# Patient Record
Sex: Female | Born: 1945
Health system: Southern US, Community
[De-identification: ages and names within clinical notes are randomized; demographics above are authoritative.]

## PROBLEM LIST (undated history)

## (undated) DIAGNOSIS — B019 Varicella without complication: Secondary | ICD-10-CM

## (undated) DIAGNOSIS — R0600 Dyspnea, unspecified: Secondary | ICD-10-CM

## (undated) DIAGNOSIS — I10 Essential (primary) hypertension: Secondary | ICD-10-CM

## (undated) DIAGNOSIS — Z87442 Personal history of urinary calculi: Secondary | ICD-10-CM

## (undated) DIAGNOSIS — Z8709 Personal history of other diseases of the respiratory system: Secondary | ICD-10-CM

## (undated) DIAGNOSIS — T4145XA Adverse effect of unspecified anesthetic, initial encounter: Secondary | ICD-10-CM

## (undated) DIAGNOSIS — T8859XA Other complications of anesthesia, initial encounter: Secondary | ICD-10-CM

## (undated) DIAGNOSIS — R011 Cardiac murmur, unspecified: Secondary | ICD-10-CM

## (undated) DIAGNOSIS — T7840XA Allergy, unspecified, initial encounter: Secondary | ICD-10-CM

## (undated) DIAGNOSIS — B059 Measles without complication: Secondary | ICD-10-CM

## (undated) HISTORY — PX: TUBAL LIGATION: SHX77

## (undated) HISTORY — DX: Measles without complication: B05.9

## (undated) HISTORY — PX: COLONOSCOPY: SHX174

## (undated) HISTORY — DX: Essential (primary) hypertension: I10

## (undated) HISTORY — DX: Varicella without complication: B01.9

## (undated) HISTORY — DX: Allergy, unspecified, initial encounter: T78.40XA

---
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
## 2000-04-17 ENCOUNTER — Other Ambulatory Visit: Admission: RE | Admit: 2000-04-17 | Discharge: 2000-04-17 | Payer: Self-pay | Admitting: Family Medicine

## 2000-05-03 ENCOUNTER — Encounter: Admission: RE | Admit: 2000-05-03 | Discharge: 2000-05-03 | Payer: Self-pay | Admitting: Family Medicine

## 2000-05-03 ENCOUNTER — Encounter: Payer: Self-pay | Admitting: Family Medicine

## 2000-09-27 ENCOUNTER — Encounter: Admission: RE | Admit: 2000-09-27 | Discharge: 2000-09-27 | Payer: Self-pay

## 2000-09-27 ENCOUNTER — Encounter: Payer: Self-pay | Admitting: Family Medicine

## 2002-01-07 ENCOUNTER — Encounter: Admission: RE | Admit: 2002-01-07 | Discharge: 2002-01-07 | Payer: Self-pay | Admitting: Family Medicine

## 2002-01-07 ENCOUNTER — Encounter: Payer: Self-pay | Admitting: Family Medicine

## 2003-05-22 ENCOUNTER — Encounter: Admission: RE | Admit: 2003-05-22 | Discharge: 2003-05-22 | Payer: Self-pay | Admitting: Family Medicine

## 2011-09-29 DIAGNOSIS — E785 Hyperlipidemia, unspecified: Secondary | ICD-10-CM | POA: Diagnosis not present

## 2011-09-29 DIAGNOSIS — Z124 Encounter for screening for malignant neoplasm of cervix: Secondary | ICD-10-CM | POA: Diagnosis not present

## 2011-09-29 DIAGNOSIS — E039 Hypothyroidism, unspecified: Secondary | ICD-10-CM | POA: Diagnosis not present

## 2011-09-29 DIAGNOSIS — I10 Essential (primary) hypertension: Secondary | ICD-10-CM | POA: Diagnosis not present

## 2011-09-29 DIAGNOSIS — Z0000 Encounter for general adult medical examination without abnormal findings: Secondary | ICD-10-CM | POA: Diagnosis not present

## 2011-09-29 DIAGNOSIS — R5381 Other malaise: Secondary | ICD-10-CM | POA: Diagnosis not present

## 2011-09-29 DIAGNOSIS — E559 Vitamin D deficiency, unspecified: Secondary | ICD-10-CM | POA: Diagnosis not present

## 2011-10-09 DIAGNOSIS — Z1212 Encounter for screening for malignant neoplasm of rectum: Secondary | ICD-10-CM | POA: Diagnosis not present

## 2011-11-16 DIAGNOSIS — R195 Other fecal abnormalities: Secondary | ICD-10-CM | POA: Diagnosis not present

## 2011-11-21 DIAGNOSIS — Z79899 Other long term (current) drug therapy: Secondary | ICD-10-CM | POA: Diagnosis not present

## 2011-11-21 DIAGNOSIS — I10 Essential (primary) hypertension: Secondary | ICD-10-CM | POA: Diagnosis not present

## 2011-11-21 DIAGNOSIS — Z1211 Encounter for screening for malignant neoplasm of colon: Secondary | ICD-10-CM | POA: Diagnosis not present

## 2011-11-21 DIAGNOSIS — Z6841 Body Mass Index (BMI) 40.0 and over, adult: Secondary | ICD-10-CM | POA: Diagnosis not present

## 2011-11-21 DIAGNOSIS — E669 Obesity, unspecified: Secondary | ICD-10-CM | POA: Diagnosis not present

## 2011-11-21 DIAGNOSIS — Z7982 Long term (current) use of aspirin: Secondary | ICD-10-CM | POA: Diagnosis not present

## 2011-11-21 DIAGNOSIS — Z880 Allergy status to penicillin: Secondary | ICD-10-CM | POA: Diagnosis not present

## 2011-11-21 DIAGNOSIS — R195 Other fecal abnormalities: Secondary | ICD-10-CM | POA: Diagnosis not present

## 2012-01-03 DIAGNOSIS — Z1231 Encounter for screening mammogram for malignant neoplasm of breast: Secondary | ICD-10-CM | POA: Diagnosis not present

## 2012-01-03 DIAGNOSIS — M810 Age-related osteoporosis without current pathological fracture: Secondary | ICD-10-CM | POA: Diagnosis not present

## 2012-01-03 DIAGNOSIS — M899 Disorder of bone, unspecified: Secondary | ICD-10-CM | POA: Diagnosis not present

## 2012-01-03 LAB — HM DEXA SCAN: HM Dexa Scan: NORMAL

## 2012-07-17 ENCOUNTER — Encounter: Payer: Self-pay | Admitting: Nurse Practitioner

## 2012-07-17 ENCOUNTER — Ambulatory Visit (INDEPENDENT_AMBULATORY_CARE_PROVIDER_SITE_OTHER): Payer: Medicare Other | Admitting: Nurse Practitioner

## 2012-07-17 VITALS — BP 140/77 | HR 76 | Temp 98.1°F | Ht 61.0 in | Wt 220.0 lb

## 2012-07-17 DIAGNOSIS — I10 Essential (primary) hypertension: Secondary | ICD-10-CM | POA: Diagnosis not present

## 2012-07-17 LAB — COMPLETE METABOLIC PANEL WITH GFR
ALT: 15 U/L (ref 0–35)
AST: 18 U/L (ref 0–37)
Albumin: 3.8 g/dL (ref 3.5–5.2)
Alkaline Phosphatase: 58 U/L (ref 39–117)
BUN: 18 mg/dL (ref 6–23)
CO2: 28 mEq/L (ref 19–32)
Calcium: 9.5 mg/dL (ref 8.4–10.5)
Chloride: 104 mEq/L (ref 96–112)
Creat: 0.8 mg/dL (ref 0.50–1.10)
GFR, Est African American: 89 mL/min
GFR, Est Non African American: 78 mL/min
Glucose, Bld: 103 mg/dL — ABNORMAL HIGH (ref 70–99)
Potassium: 4.3 mEq/L (ref 3.5–5.3)
Sodium: 143 mEq/L (ref 135–145)
Total Bilirubin: 0.4 mg/dL (ref 0.3–1.2)
Total Protein: 6.7 g/dL (ref 6.0–8.3)

## 2012-07-17 MED ORDER — BENAZEPRIL-HYDROCHLOROTHIAZIDE 20-12.5 MG PO TABS: 1.0000 | ORAL_TABLET | Freq: Every day | ORAL | Status: DC

## 2012-07-17 NOTE — Progress Notes (Signed)
  Subjective:    Patient ID: Angela Dawson, female    DOB: 1945-03-11, 67 y.o.   MRN: [id number]  Hypertension This is a chronic problem. The current episode started more than 1 year ago. The problem is unchanged. The problem is controlled. Pertinent negatives include no blurred vision, chest pain, headaches, malaise/fatigue, peripheral edema or shortness of breath. There are no associated agents to hypertension. Risk factors for coronary artery disease include obesity, sedentary lifestyle and post-menopausal state. Past treatments include ACE inhibitors and diuretics. The current treatment provides significant improvement. There are no compliance problems.       Review of Systems  Constitutional: Negative for malaise/fatigue.  Eyes: Negative for blurred vision.  Respiratory: Negative for shortness of breath.   Cardiovascular: Negative for chest pain.  Neurological: Negative for headaches.  All other systems reviewed and are negative.       Objective:   Physical Exam  Constitutional: She is oriented to person, place, and time. She appears well-developed and well-nourished.  HENT:  Nose: Nose normal.  Mouth/Throat: Oropharynx is clear and moist.  Eyes: EOM are normal.  Neck: Trachea normal, normal range of motion and full passive range of motion without pain. Neck supple. No JVD present. Carotid bruit is not present. No thyromegaly present.  Cardiovascular: Normal rate, regular rhythm, normal heart sounds and intact distal pulses.  Exam reveals no gallop and no friction rub.   No murmur heard. Pulmonary/Chest: Effort normal and breath sounds normal.  Abdominal: Soft. Bowel sounds are normal. She exhibits no distension and no mass. There is no tenderness.  Musculoskeletal: Normal range of motion.  Lymphadenopathy:    She has no cervical adenopathy.  Neurological: She is alert and oriented to person, place, and time. She has normal reflexes.  Skin: Skin is warm and dry.  Psychiatric:  She has a normal mood and affect. Her behavior is normal. Judgment and thought content normal.  BP 140/77  Pulse 76  Temp(Src) 98.1 F (36.7 C) (Oral)  Ht 5\' 1"  (1.549 m)  Wt 220 lb (99.791 kg)  BMI 41.59 kg/m2         Assessment & Plan:  1. Hypertension Low NA+ diet exercise - benazepril-hydrochlorthiazide (LOTENSIN HCT) 20-12.5 MG per tablet; Take 1 tablet by mouth daily.  Dispense: 90 tablet; Refill: 1 - COMPLETE METABOLIC PANEL WITH GFR - NMR Lipoprofile with Lipids  Mary-Margaret Daphine Deutscher, FNP

## 2012-07-17 NOTE — Patient Instructions (Signed)

## 2012-07-19 LAB — NMR LIPOPROFILE WITH LIPIDS
Cholesterol, Total: 202 mg/dL — ABNORMAL HIGH (ref <200)
HDL Particle Number: 30.5 umol/L (ref >=30.5)
HDL Size: 8.9 nm — ABNORMAL LOW (ref >=9.2)
HDL-C: 45 mg/dL (ref >=40)
LDL (calc): 131 mg/dL — ABNORMAL HIGH (ref <100)
LDL Particle Number: 1768 nmol/L — ABNORMAL HIGH (ref <1000)
LDL Size: 21 nm (ref >20.5)
LP-IR Score: 64 — ABNORMAL HIGH (ref <=45)
Large HDL-P: 6.2 umol/L (ref >=4.8)
Large VLDL-P: 5.2 nmol/L — ABNORMAL HIGH (ref <=2.7)
Small LDL Particle Number: 670 nmol/L — ABNORMAL HIGH (ref <=527)
Triglycerides: 129 mg/dL (ref <150)
VLDL Size: 54.7 nm — ABNORMAL HIGH (ref <=46.6)

## 2013-04-02 ENCOUNTER — Encounter: Payer: Self-pay | Admitting: Nurse Practitioner

## 2013-04-02 ENCOUNTER — Ambulatory Visit (INDEPENDENT_AMBULATORY_CARE_PROVIDER_SITE_OTHER): Payer: Medicare Other | Admitting: Nurse Practitioner

## 2013-04-02 VITALS — BP 178/86 | HR 64 | Temp 97.2°F | Ht 61.0 in | Wt 214.0 lb

## 2013-04-02 DIAGNOSIS — I10 Essential (primary) hypertension: Secondary | ICD-10-CM

## 2013-04-02 MED ORDER — BENAZEPRIL-HYDROCHLOROTHIAZIDE 20-12.5 MG PO TABS
1.0000 | ORAL_TABLET | Freq: Every day | ORAL | Status: DC
Start: 2013-04-02 — End: 2013-09-30

## 2013-04-02 NOTE — Progress Notes (Signed)
Subjective:    Patient ID: Angela Dawson, female    DOB: [birth year], 68 y.o.   MRN: [id number]  Hypertension This is a chronic problem. The current episode started more than 1 year ago. The problem is unchanged. The problem is controlled. Pertinent negatives include no blurred vision, chest pain, headaches, malaise/fatigue, peripheral edema or shortness of breath. There are no associated agents to hypertension. Risk factors for coronary artery disease include obesity, sedentary lifestyle and post-menopausal state. Past treatments include ACE inhibitors and diuretics. The current treatment provides significant improvement. There are no compliance problems.       Review of Systems  Constitutional: Negative for fever, malaise/fatigue and fatigue.  HENT: Negative for congestion and sinus pressure.   Eyes: Negative for blurred vision.  Respiratory: Negative for shortness of breath.   Cardiovascular: Negative for chest pain.  Gastrointestinal: Negative for abdominal pain, diarrhea and constipation.  Genitourinary: Negative for dysuria and frequency.  Musculoskeletal: Negative for back pain and joint swelling.  Skin: Negative for rash.  Neurological: Negative for headaches.  All other systems reviewed and are negative.       Objective:   Physical Exam  Constitutional: She is oriented to person, place, and time. She appears well-developed and well-nourished.  HENT:  Nose: Nose normal.  Mouth/Throat: Oropharynx is clear and moist.  Eyes: EOM are normal.  Neck: Trachea normal, normal range of motion and full passive range of motion without pain. Neck supple. No JVD present. Carotid bruit is not present. No thyromegaly present.  Cardiovascular: Normal rate, regular rhythm and intact distal pulses.  Exam reveals no gallop and no friction rub.   Murmur (1/6) heard. Pulmonary/Chest: Effort normal and breath sounds normal.  Abdominal: Soft. Bowel sounds are normal. She exhibits no distension  and no mass. There is no tenderness.  Musculoskeletal: Normal range of motion.  Lymphadenopathy:    She has no cervical adenopathy.  Neurological: She is alert and oriented to person, place, and time. She has normal reflexes.  Skin: Skin is warm and dry.  Psychiatric: She has a normal mood and affect. Her behavior is normal. Judgment and thought content normal.  BP 178/86  Pulse 64  Temp(Src) 97.2 F (36.2 C) (Oral)  Ht $R'5\' 1"'ss$  (1.549 m)  Wt 214 lb (97.07 kg)  BMI 40.46 kg/m2         Assessment & Plan:   1. Hypertension    Orders Placed This Encounter  Procedures  . CMP14+EGFR  . NMR, lipoprofile  . 2D Echocardiogram without contrast    Standing Status: Future     Number of Occurrences:      Standing Expiration Date: 04/02/2014    Order Specific Question:  Type of Echo    Answer:  Complete    Order Specific Question:  Reason for Exam    Answer:  heart murmur    Order Specific Question:  Where should this test be performed    Answer:  [facility]   Meds ordered this encounter  Medications  . benazepril-hydrochlorthiazide (LOTENSIN HCT) 20-12.5 MG per tablet    Sig: Take 1 tablet by mouth daily.    Dispense:  90 tablet    Refill:  1   Orders Placed This Encounter  Procedures  . CMP14+EGFR  . NMR, lipoprofile  . 2D Echocardiogram without contrast    Standing Status: Future     Number of Occurrences:      Standing Expiration Date: 04/02/2014    Order Specific Question:  Type  of Echo    Answer:  Complete    Order Specific Question:  Reason for Exam    Answer:  heart murmur    Order Specific Question:  Where should this test be performed    Answer:  [facility]    New heart murmur- to be evaluated Labs pending Health maintenance reviewed Diet and exercise encouraged Continue all meds Follow up  In 6 months   [place], FNP

## 2013-04-02 NOTE — Patient Instructions (Signed)
Heart Murmur A heart murmur is an extra sound heard by your health care provider when listening to your heart with a device called a stethoscope. The sound comes from turbulence when blood flows through the heart and may be a "hum" or "whoosh" sound heard when the heart beats. There are two types of heart murmurs:  Innocent murmurs Most people with this type of heart murmur do not have a heart problem. Many children have innocent heart murmurs. Your health care provider may suggest some basic testing to know whether your murmur is an innocent murmur. If an innocent heart murmur is found, there is no need for further tests or treatment and no need to restrict activities or stop playing sports.  Abnormal murmurs These types of murmurs can occur in children and adults. In children, abnormal heart murmurs are typically caused from heart defects that are present at birth (congenital). In adults, abnormal murmurs are usually from heart valve problems caused by disease, infection, or aging. CAUSES  All heart murmurs are a result of an issue with your heart valves. Normally, these valves open to let blood flow through or out of your heart and then shut to keep it from flowing backward. If they do not work properly, you could have:  Regurgitation When blood leaks back through the valve in the wrong direction.  Mitral valve prolapse When the mitral valve of the heart has a loose flap and does not close tightly.  Stenosis When the valve does not open enough and blocks blood flow. SIGNS AND SYMPTOMS  Innocent murmurs do not cause symptoms, and many people with abnormal murmurs may or may not have symptoms. If symptoms do develop, they may include:  Shortness of breath.  Blue coloring of the skin, especially on the fingertips.  Chest pain.  Palpitations, or feeling a fluttering or skipped heartbeat.  Fainting.  Persistent cough.  Getting tired much faster than expected. DIAGNOSIS  A heart murmur  might be heard during a sports physical or during any type of examination. When a murmur is heard, it may suggest a possible problem. When this happens, your health care provider may ask you to see a heart specialist (cardiologist). You may also be asked to have one or more heart tests. In these cases, testing may vary depending on what your health care provider heard. Tests for a heart murmur may include:  Electrocardiogram.  Echocardiogram.  MRI. For children and adults who have an abnormal heart murmur and want to play sports, it is important to complete testing, review test results, and receive recommendations from your health care provider. If heart disease is present, it may not be safe to play. TREATMENT  Innocent murmurs require no treatment or activity restriction. If an abnormal murmur represents a problem with the heart, treatment will depend on the exact nature of the problem. In these cases, medicine or surgery may be needed to treat the problem. HOME CARE INSTRUCTIONS If you want to participate in sports or other types of strenuous physical activity, it is important to discuss this first with your health care provider. If the murmur represents a problem with the heart and you choose to participate in sports, there is a small chance that a serious problem (including sudden death) could result.  SEEK MEDICAL CARE IF:   You feel that your symptoms are slowly worsening.  You develop any new symptoms that cause concern.  You feel that you are having side effects from any medicines prescribed. SEEK IMMEDIATE   MEDICAL CARE IF:   You develop chest pain.  You have shortness of breath.  You notice that your heart beats irregularly often enough to cause you to worry.  You have fainting spells.  Your symptoms suddenly get worse. Document Released: 03/30/2004 Document Revised: 12/11/2012 Document Reviewed: 10/28/2012 [employer] Patient Information 2014 [employer].  

## 2013-04-03 LAB — CMP14+EGFR
A/G RATIO: 1.8 (ref 1.1–2.5)
ALK PHOS: 66 IU/L (ref 39–117)
ALT: 15 IU/L (ref 0–32)
AST: 15 IU/L (ref 0–40)
Albumin: 4.2 g/dL (ref 3.6–4.8)
BUN / CREAT RATIO: 19 (ref 11–26)
BUN: 14 mg/dL (ref 8–27)
CO2: 25 mmol/L (ref 18–29)
Calcium: 9.2 mg/dL (ref 8.7–10.3)
Chloride: 101 mmol/L (ref 97–108)
Creatinine, Ser: 0.75 mg/dL (ref 0.57–1.00)
GFR calc Af Amer: 96 mL/min/{1.73_m2} (ref >59)
GFR, EST NON AFRICAN AMERICAN: 83 mL/min/{1.73_m2} (ref >59)
GLOBULIN, TOTAL: 2.3 g/dL (ref 1.5–4.5)
Glucose: 85 mg/dL (ref 65–99)
Potassium: 4.2 mmol/L (ref 3.5–5.2)
SODIUM: 143 mmol/L (ref 134–144)
Total Bilirubin: 0.4 mg/dL (ref 0.0–1.2)
Total Protein: 6.5 g/dL (ref 6.0–8.5)

## 2013-04-03 LAB — NMR, LIPOPROFILE
Cholesterol: 232 mg/dL — ABNORMAL HIGH (ref <200)
HDL Cholesterol by NMR: 57 mg/dL (ref >=40)
HDL PARTICLE NUMBER: 32.9 umol/L (ref >=30.5)
LDL Particle Number: 2054 nmol/L — ABNORMAL HIGH (ref <1000)
LDL Size: 21.3 nm (ref >20.5)
LDLC SERPL CALC-MCNC: 155 mg/dL — ABNORMAL HIGH (ref <100)
LP-IR SCORE: 43 (ref <=45)
SMALL LDL PARTICLE NUMBER: 813 nmol/L — AB (ref <=527)
Triglycerides by NMR: 100 mg/dL (ref <150)

## 2013-04-04 ENCOUNTER — Other Ambulatory Visit: Payer: Self-pay | Admitting: Nurse Practitioner

## 2013-04-04 MED ORDER — ATORVASTATIN CALCIUM 40 MG PO TABS: 40.0000 mg | ORAL_TABLET | Freq: Every day | ORAL | Status: DC

## 2013-05-22 ENCOUNTER — Telehealth: Payer: Self-pay | Admitting: Family Medicine

## 2013-05-22 NOTE — Telephone Encounter (Signed)
ok 

## 2013-07-21 DIAGNOSIS — M25569 Pain in unspecified knee: Secondary | ICD-10-CM | POA: Diagnosis not present

## 2013-09-30 ENCOUNTER — Ambulatory Visit (INDEPENDENT_AMBULATORY_CARE_PROVIDER_SITE_OTHER): Payer: Medicare Other | Admitting: Nurse Practitioner

## 2013-09-30 ENCOUNTER — Encounter: Payer: Self-pay | Admitting: Nurse Practitioner

## 2013-09-30 VITALS — BP 139/69 | HR 63 | Temp 98.7°F | Ht 61.0 in | Wt 211.4 lb

## 2013-09-30 DIAGNOSIS — I10 Essential (primary) hypertension: Secondary | ICD-10-CM

## 2013-09-30 DIAGNOSIS — E785 Hyperlipidemia, unspecified: Secondary | ICD-10-CM | POA: Diagnosis not present

## 2013-09-30 DIAGNOSIS — R011 Cardiac murmur, unspecified: Secondary | ICD-10-CM | POA: Diagnosis not present

## 2013-09-30 MED ORDER — BENAZEPRIL-HYDROCHLOROTHIAZIDE 20-12.5 MG PO TABS: 1.0000 | ORAL_TABLET | Freq: Every day | ORAL | Status: DC

## 2013-09-30 MED ORDER — ROSUVASTATIN CALCIUM 20 MG PO TABS: ORAL_TABLET | ORAL | Status: DC

## 2013-09-30 NOTE — Patient Instructions (Signed)

## 2013-09-30 NOTE — Progress Notes (Signed)
Subjective:    Patient ID: Angela Dawson, female    DOB: 06/10/1945, 68 y.o.   MRN: [id number]  Patient is here today for chronic disease follow up.   Hypertension This is a chronic problem. The current episode started more than 1 year ago. The problem is unchanged. The problem is controlled. Pertinent negatives include no blurred vision, chest pain, headaches, malaise/fatigue, peripheral edema or shortness of breath. There are no associated agents to hypertension. Risk factors for coronary artery disease include obesity, sedentary lifestyle and post-menopausal state. Past treatments include ACE inhibitors and diuretics. The current treatment provides significant improvement. There are no compliance problems.   Hyperlipidemia This is a chronic problem. The current episode started more than 1 year ago. Recent lipid tests were reviewed and are high. Exacerbating diseases include obesity. Pertinent negatives include no chest pain or shortness of breath. There are no compliance problems.  Risk factors for coronary artery disease include post-menopausal and hypertension.   * patient stop taking lipitor due to side effect.  *patient reports baker cyst on the back of her right leg, tender on palpation. She sustain a fall back in march.    Review of Systems  Constitutional: Negative for malaise/fatigue.  Eyes: Negative for blurred vision.  Respiratory: Negative for shortness of breath.   Cardiovascular: Negative for chest pain.  Neurological: Negative for headaches.  All other systems reviewed and are negative.      Objective:   Physical Exam  Constitutional: She is oriented to person, place, and time. She appears well-developed and well-nourished.  HENT:  Nose: Nose normal.  Mouth/Throat: Oropharynx is clear and moist.  Eyes: EOM are normal.  Neck: Trachea normal, normal range of motion and full passive range of motion without pain. Neck supple. No JVD present. Carotid bruit is not present.  No thyromegaly present.  Cardiovascular: Normal rate, regular rhythm and intact distal pulses.  Exam reveals no gallop and no friction rub.   Murmur (1/6 systolic murmur) heard. Pulmonary/Chest: Effort normal and breath sounds normal.  Abdominal: Soft. Bowel sounds are normal. She exhibits no distension and no mass. There is no tenderness.  Musculoskeletal: Normal range of motion.  Lymphadenopathy:    She has no cervical adenopathy.  Neurological: She is alert and oriented to person, place, and time. She has normal reflexes.  Skin: Skin is warm and dry.  Psychiatric: She has a normal mood and affect. Her behavior is normal. Judgment and thought content normal.   BP 139/69  Pulse 63  Temp(Src) 98.7 F (37.1 C) (Oral)  Ht $R'5\' 1"'VN$  (1.549 m)  Wt 211 lb 6.4 oz (95.89 kg)  BMI 39.96 kg/m2         Assessment & Plan:   1. Essential hypertension   2. Hyperlipidemia   3. Severe obesity (BMI >= 40)    Orders Placed This Encounter  Procedures  . HM DEXA SCAN    This external order was created through the Results Console.  . CMP14+EGFR  . NMR, lipoprofile   Meds ordered this encounter  Medications  . benazepril-hydrochlorthiazide (LOTENSIN HCT) 20-12.5 MG per tablet    Sig: Take 1 tablet by mouth daily.    Dispense:  90 tablet    Refill:  1    Order Specific Question:  Supervising Provider    Answer:  Chipper Herb [[id number]]  . rosuvastatin (CRESTOR) 20 MG tablet    Sig: 1/2 PO qd    Dispense:  28 tablet    Refill:  0    Order Specific Question:  Supervising Provider    Answer:  Chipper Herb [[id number]]    Patient refuse hemmocult card She will try crestor $RemoveBef'10mg'OxstKjIUSE$  daily for HLD, will reports any side effects such as myalgia.  Labs pending Health maintenance reviewed Diet and exercise encouraged Continue all meds Follow up  In 6 months prn  Mary-Margaret Hassell Done, FNP

## 2013-10-01 LAB — CMP14+EGFR
ALT: 15 IU/L (ref 0–32)
AST: 15 IU/L (ref 0–40)
Albumin/Globulin Ratio: 1.8 (ref 1.1–2.5)
Albumin: 4.2 g/dL (ref 3.6–4.8)
Alkaline Phosphatase: 61 IU/L (ref 39–117)
BUN/Creatinine Ratio: 19 (ref 11–26)
BUN: 15 mg/dL (ref 8–27)
CHLORIDE: 100 mmol/L (ref 97–108)
CO2: 24 mmol/L (ref 18–29)
Calcium: 9.5 mg/dL (ref 8.7–10.3)
Creatinine, Ser: 0.78 mg/dL (ref 0.57–1.00)
GFR calc Af Amer: 91 mL/min/{1.73_m2} (ref >59)
GFR calc non Af Amer: 79 mL/min/{1.73_m2} (ref >59)
GLUCOSE: 95 mg/dL (ref 65–99)
Globulin, Total: 2.3 g/dL (ref 1.5–4.5)
Potassium: 4.2 mmol/L (ref 3.5–5.2)
SODIUM: 140 mmol/L (ref 134–144)
TOTAL PROTEIN: 6.5 g/dL (ref 6.0–8.5)
Total Bilirubin: 0.4 mg/dL (ref 0.0–1.2)

## 2013-10-01 LAB — NMR, LIPOPROFILE
Cholesterol: 235 mg/dL — ABNORMAL HIGH (ref 100–199)
HDL CHOLESTEROL BY NMR: 50 mg/dL (ref >39)
HDL Particle Number: 32.6 umol/L (ref >=30.5)
LDL PARTICLE NUMBER: 1934 nmol/L — AB (ref <1000)
LDL Size: 21.4 nm (ref >20.5)
LDLC SERPL CALC-MCNC: 156 mg/dL — ABNORMAL HIGH (ref 0–99)
LP-IR Score: 57 — ABNORMAL HIGH (ref <=45)
SMALL LDL PARTICLE NUMBER: 500 nmol/L (ref <=527)
Triglycerides by NMR: 147 mg/dL (ref 0–149)

## 2013-10-02 ENCOUNTER — Ambulatory Visit (HOSPITAL_COMMUNITY)
Admission: RE | Admit: 2013-10-02 | Discharge: 2013-10-02 | Disposition: A | Discharge status: Home / Self Care | Payer: Medicare Other | Source: Ambulatory Visit | Attending: Family Medicine | Admitting: Family Medicine

## 2013-10-02 DIAGNOSIS — R011 Cardiac murmur, unspecified: Secondary | ICD-10-CM | POA: Diagnosis not present

## 2013-10-02 DIAGNOSIS — I359 Nonrheumatic aortic valve disorder, unspecified: Secondary | ICD-10-CM | POA: Diagnosis not present

## 2013-10-02 DIAGNOSIS — E669 Obesity, unspecified: Secondary | ICD-10-CM | POA: Insufficient documentation

## 2013-10-02 DIAGNOSIS — E785 Hyperlipidemia, unspecified: Secondary | ICD-10-CM | POA: Diagnosis not present

## 2013-10-02 DIAGNOSIS — I10 Essential (primary) hypertension: Secondary | ICD-10-CM | POA: Diagnosis not present

## 2013-10-02 NOTE — Progress Notes (Signed)
  Echocardiogram 2D Echocardiogram has been performed.  Ruhi Kopke 10/02/2013, 11:52 AM

## 2013-10-08 ENCOUNTER — Other Ambulatory Visit: Payer: Self-pay | Admitting: Nurse Practitioner

## 2013-10-08 ENCOUNTER — Telehealth: Payer: Self-pay | Admitting: Family Medicine

## 2013-10-08 DIAGNOSIS — I350 Nonrheumatic aortic (valve) stenosis: Secondary | ICD-10-CM

## 2013-10-08 NOTE — Telephone Encounter (Signed)
Patient can not take any cholesterol med's

## 2013-10-09 NOTE — Telephone Encounter (Signed)
Make sure understands risks of cholesterol being so high

## 2013-10-10 ENCOUNTER — Ambulatory Visit (INDEPENDENT_AMBULATORY_CARE_PROVIDER_SITE_OTHER): Payer: Medicare Other | Admitting: Cardiology

## 2013-10-10 ENCOUNTER — Encounter: Payer: Self-pay | Admitting: Cardiology

## 2013-10-10 VITALS — BP 117/76 | HR 67 | Ht 60.0 in | Wt 211.0 lb

## 2013-10-10 DIAGNOSIS — I712 Thoracic aortic aneurysm, without rupture, unspecified: Secondary | ICD-10-CM | POA: Diagnosis not present

## 2013-10-10 DIAGNOSIS — I359 Nonrheumatic aortic valve disorder, unspecified: Secondary | ICD-10-CM

## 2013-10-10 DIAGNOSIS — I350 Nonrheumatic aortic (valve) stenosis: Secondary | ICD-10-CM

## 2013-10-10 DIAGNOSIS — Q231 Congenital insufficiency of aortic valve: Secondary | ICD-10-CM

## 2013-10-10 NOTE — Telephone Encounter (Signed)
Patient is aware. And says she understands...Marland KitchMarland Kitchenen

## 2013-10-10 NOTE — Patient Instructions (Addendum)
   CTA of chest / aorta for thoracic aneurysm  Office will contact with results via phone or letter.    Continue all current medications. Follow up pending test results

## 2013-10-10 NOTE — Progress Notes (Addendum)
Clinical Summary Angela Dawson is a 68 y.o.female seen today as a new patient for the following medical problems.  1. Heart murmur - echo recently ordered by pcp for heart murmur - 09/2013 echo showed LVEF 65-70%, grade I diastolic dysfunction. Bicuspid AV with moderate to severe AS (mean grad 21, dimensionless index 0.32, valve area 0.93), mild AI. Described mild aorta dilatation from limited views, ascending aorta 4.6 cm.  - denies any SOB. 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 Denies any chest pain. No syncope    Past Medical History  Diagnosis Date  . Hypertension      Allergies  Allergen Reactions  . Penicillins Rash     Current Outpatient Prescriptions  Medication Sig Dispense Refill  . aspirin 81 MG tablet Take 81 mg by mouth daily.      . benazepril-hydrochlorthiazide (LOTENSIN HCT) 20-12.5 MG per tablet Take 1 tablet by mouth daily.  90 tablet  1  . rosuvastatin (CRESTOR) 20 MG tablet 1/2 PO qd  28 tablet  0   No current facility-administered medications for this visit.     Past Surgical History  Procedure Laterality Date  . Tubal ligation       Allergies  Allergen Reactions  . Penicillins Rash      Family History  Problem Relation Age of Onset  . Alzheimer's disease Mother   . Heart attack Father      Social History Angela Dawson reports that she has never smoked. She does not have any smokeless tobacco history on file. Angela Dawson reports that she does not drink alcohol.   Review of Systems CONSTITUTIONAL: No weight loss, fever, chills, weakness or fatigue.  HEENT: Eyes: No visual loss, blurred vision, double vision or yellow sclerae.No hearing loss, sneezing, congestion, runny nose or sore throat.  SKIN: No rash or itching.  CARDIOVASCULAR: per HPI RESPIRATORY: No shortness of breath, cough or sputum.  GASTROINTESTINAL: No anorexia, nausea, vomiting or diarrhea. No abdominal pain  or blood.  GENITOURINARY: No burning on urination, no polyuria NEUROLOGICAL: No headache, dizziness, syncope, paralysis, ataxia, numbness or tingling in the extremities. No change in bowel or bladder control.  MUSCULOSKELETAL: No muscle, back pain, joint pain or stiffness.  LYMPHATICS: No enlarged nodes. No history of splenectomy.  PSYCHIATRIC: No history of depression or anxiety.  ENDOCRINOLOGIC: No reports of sweating, cold or heat intolerance. No polyuria or polydipsia.  Marland Kitchen   Physical Examination p 67 bp 117/76 Wt 211 lbs BMI 41 Gen: resting comfortably, no acute distress HEENT: no scleral icterus, pupils equal round and reactive, no palptable cervical adenopathy,  CV: RRR, no m/r/g, no JVD, no carotid bruits Resp: Clear to auscultation bilaterally GI: abdomen is soft, non-tender, non-distended, normal bowel sounds, no hepatosplenomegaly MSK: extremities are warm, no edema.  Skin: warm, no rash Neuro:  no focal deficits Psych: appropriate affect   Diagnostic Studies 09/2013 Echo Study Conclusions  - Left ventricle: The cavity size was normal. Wall thickness was increased in a pattern of mild LVH. Systolic function was vigorous. The estimated ejection fraction was in the range of 65% to 70%. Wall motion was normal; there were no regional wall motion abnormalities. Doppler parameters are consistent with abnormal left ventricular relaxation (grade 1 diastolic dysfunction). - Aortic valve: Bicuspid; moderately calcified leaflets. Cusp separation was reduced. There was moderate to severe stenosis. There was mild regurgitation. Mean gradient (S): 21 mm Hg. LVOT/AV VTI ratio 0.32. Peak  gradient (S): 40 mm Hg. Valve area (VTI): 0.93 cm^2. Valve area (Vmax): 0.96 cm^2. - Ascending aorta: The ascending aorta was moderately dilated. Approximately 46 mm. - Mitral valve: Calcified annulus. There was trivial regurgitation. - Right atrium: Central venous pressure (est): 3 mm Hg. - Atrial  septum: No defect or patent foramen ovale was identified. - Tricuspid valve: There was trivial regurgitation. - Pulmonary arteries: PA peak pressure: 29 mm Hg (S). - Pericardium, extracardiac: There was no pericardial effusion.  Impressions:  - Mild LVH with LVEF 65-70%, grade 1 diastolic dysfunction. Moderate to severe aortic stenosis as outlined with bicuspid aortic valve and mild aortic regurgitation. There is also at least moderate dilatation of the ascending aorta based on limited views. Given association of aortic aneurysmal disease with bicuspid aortic valve, would suggest further dedicated imaging of the thoracic aorta with CTA or MRA. Normal PASP 29 mmHg.      Assessment and Plan  1. Bicuspid aortic valve - severe by reported area, moderate by gradient and dimensionless index - no current symptoms - will review study to evaluate for possible small chamber size or decreased stroke volume in setting of normal LVEF to possibly explain severe AS by area and only moderate gradient (possible paradoxical low flow severe AS) - from limited views appears to be some dilatation of ascending aorta, will obtain CTA. Increased risk of aortopathy in setting of bicuspid AV.  - further eval pending CT results and echo review  F/u pending CT results     Angela PocheJonathan F. Marili Dawson, [name], F.A.C.C.   10/22/13 Addendum Echo reviewed, AV does appear bicuspid. LVOT at 20cm, LVOT VTI 27, BSA 1.93, AV VTI 79.4, mean grad 21 (mean grad 25 mmHg by pedoff), dimensionless index 0.34. The calculated SVI is 44. AVA by VTI 1.1 cm squared. The mean gradient is likely underestimated based on angle of acquisition.   Overall appears moderate to borderline severe AS. CT scan with 4.8 x 4.6 cm ascending aortic aneurysm consistent with bicuspid AV associated aortopathy. She denies symptoms. With moderate AV criteria will not pursue exercise testing at this time. She meets borderline criteria for surgery for both AV  and aneurysm. She will need close monitoring to decide when the best time is for surgery, I will go ahead and have her see CT surgery now to help in establishing that timeline.   Angela RichJonathan Kerston Landeck MD

## 2013-10-14 ENCOUNTER — Telehealth: Payer: Self-pay | Admitting: Cardiology

## 2013-10-14 NOTE — Telephone Encounter (Signed)
CT ANGIO NECK/C/A/P  [facility]uled [facility]17-[facility] at [facility]. Checking percert

## 2013-10-14 NOTE — Telephone Encounter (Signed)
Pt has Medicare and [employer] Medicare supplement.  No precert required.

## 2013-10-17 ENCOUNTER — Ambulatory Visit (HOSPITAL_COMMUNITY): Payer: Medicare Other

## 2013-10-21 ENCOUNTER — Ambulatory Visit (HOSPITAL_COMMUNITY)
Admission: RE | Admit: 2013-10-21 | Discharge: 2013-10-21 | Disposition: A | Discharge status: Home / Self Care | Payer: Medicare Other | Source: Ambulatory Visit | Attending: Cardiology | Admitting: Cardiology

## 2013-10-21 DIAGNOSIS — I712 Thoracic aortic aneurysm, without rupture, unspecified: Secondary | ICD-10-CM | POA: Diagnosis not present

## 2013-10-21 DIAGNOSIS — Z87891 Personal history of nicotine dependence: Secondary | ICD-10-CM | POA: Diagnosis not present

## 2013-10-21 DIAGNOSIS — I700 Atherosclerosis of aorta: Secondary | ICD-10-CM | POA: Diagnosis not present

## 2013-10-21 DIAGNOSIS — I10 Essential (primary) hypertension: Secondary | ICD-10-CM | POA: Insufficient documentation

## 2013-10-21 MED ORDER — IOHEXOL 350 MG/ML SOLN
100.0000 mL | Freq: Once | INTRAVENOUS | Status: AC | PRN
Start: 2013-10-21 — End: 2013-10-21
  Administered 2013-10-21: 100 mL via INTRAVENOUS

## 2013-10-23 ENCOUNTER — Ambulatory Visit: Payer: Medicare Other | Admitting: Cardiology

## 2013-10-23 ENCOUNTER — Telehealth: Payer: Self-pay

## 2013-10-23 DIAGNOSIS — Q231 Congenital insufficiency of aortic valve: Secondary | ICD-10-CM

## 2013-10-23 DIAGNOSIS — I7121 Aneurysm of the ascending aorta, without rupture: Secondary | ICD-10-CM

## 2013-10-23 DIAGNOSIS — I712 Thoracic aortic aneurysm, without rupture: Secondary | ICD-10-CM

## 2013-10-23 NOTE — Telephone Encounter (Signed)
Ref placed to CT surgery [facility]   LM telling pt above info and to expect a call from their office

## 2013-10-23 NOTE — Telephone Encounter (Signed)
Message copied by Nori RiisARLTON, CATHERINE A on Thu Oct 23, 2013  8:15 AM ------      Message from: [place]villeBRANCH, JONATHAN F      Created: Wed Oct 22, 2013  3:02 PM       Patient needs referal to CT surgery for bicuspid aortic valve with stenosis and ascending aortic aneurysm                  Dominga FerryJ Branch MD ------

## 2013-10-27 ENCOUNTER — Encounter: Payer: Medicare Other | Admitting: Cardiothoracic Surgery

## 2013-11-20 ENCOUNTER — Encounter: Payer: Self-pay | Admitting: Cardiothoracic Surgery

## 2013-11-20 ENCOUNTER — Institutional Professional Consult (permissible substitution) (INDEPENDENT_AMBULATORY_CARE_PROVIDER_SITE_OTHER): Payer: Medicare Other | Admitting: Cardiothoracic Surgery

## 2013-11-20 VITALS — BP 180/88 | HR 64 | Resp 16 | Ht 60.0 in | Wt 214.0 lb

## 2013-11-20 DIAGNOSIS — Q230 Congenital stenosis of aortic valve: Secondary | ICD-10-CM

## 2013-11-20 DIAGNOSIS — I350 Nonrheumatic aortic (valve) stenosis: Secondary | ICD-10-CM

## 2013-11-20 DIAGNOSIS — I359 Nonrheumatic aortic valve disorder, unspecified: Secondary | ICD-10-CM

## 2013-11-20 DIAGNOSIS — I712 Thoracic aortic aneurysm, without rupture, unspecified: Secondary | ICD-10-CM

## 2013-11-20 DIAGNOSIS — I77810 Thoracic aortic ectasia: Secondary | ICD-10-CM | POA: Diagnosis not present

## 2013-11-20 DIAGNOSIS — Q231 Congenital insufficiency of aortic valve: Secondary | ICD-10-CM | POA: Diagnosis not present

## 2013-11-20 DIAGNOSIS — I7121 Aneurysm of the ascending aorta, without rupture: Secondary | ICD-10-CM

## 2013-11-20 NOTE — Progress Notes (Signed)
[address]       [place] [address]             [phone]                    Angela Dawson [facility] Medical Record #[id number] Date of Birth: [birth year]  Referring: Antoine Poche, MD Primary Care: Rudi Heap, MD  Chief Complaint:    Chief Complaint  Patient presents with  . Aortic Stenosis    CTA CHEST 10/21/13 .Marland Kitchen..echo 09/22/13 ... referred by DR. Dina Rich  . Thoracic Aortic Aneurysm    History of Present Illness:    Angela Dawson 68 y.o. female is seen in the office  today for evaluation of aortic stenosis bicuspid aortic valve and moderately dilated ascending aorta. The patient is asymptomatic from a cardiac standpoint, she denies shortness of breath, PND, pedal edema, syncope, presyncope. She was noted on routine exam by nurse practitioner student to have a murmur. This led to evaluation by cardiology and a CT scan of the chest and echocardiogram.   Other than hypertension the patient has no previous history of cardiac disease She does have a family history of cardiac disease her father died of myocardial infarction age 38    Current Activity/ Functional Status:  Patient is independent with mobility/ambulation, transfers, ADL's, IADL's.   Zubrod Score: At the time of surgery this patient's most appropriate activity status/level should be described as:     0    Normal activity, no symptoms     1    Restricted in physical strenuous activity but ambulatory, able to do out light work     2    Ambulatory and capable of self care, unable to do work activities, up and about               >50 % of waking hours                                  3    Only limited self care, in bed greater than 50% of waking hours     4    Completely disabled, no self care, confined to bed or chair     5    Moribund   Past Medical History  Diagnosis Date  . Hypertension     Past Surgical History  Procedure Laterality Date  . Tubal  ligation      Family History  Problem Relation Age of Onset  . Alzheimer's disease Mother   . Heart attack Father       History  Smoking status  . Former Smoker  . Quit date: 10/10/1973  Smokeless tobacco  . Not on file    Comment: smoked less than 1 pack every 2 weeks. Havent smoked in atleast 40 years    History  Alcohol Use No     Allergies  Allergen Reactions  . Crestor [Rosuvastatin]     Dizziness  . Lipitor [Atorvastatin]     Unable to stand or move  . Penicillins Rash    Current Outpatient Prescriptions  Medication Sig Dispense Refill  . aspirin 81 MG tablet Take 81 mg by mouth daily.      . benazepril-hydrochlorthiazide (LOTENSIN HCT) 20-12.5 MG per tablet Take 1 tablet by mouth daily.  90 tablet  1   No current facility-administered medications for this visit.  Review of Systems:     Cardiac Review of Systems: Y or N  Chest Pain [ n   ]  Resting SOB [n   ] Exertional SOB  [ n ]  Orthopnea [n  ]   Pedal Edema [ n  ]    Palpitations [n  ] Syncope  Milo.Brash  ]   Presyncope [ n  ]  General Review of Systems: [Y] = yes [  ]=no Constitional: recent weight change [  ];  Wt loss over the last 3 months [  n ] anorexia [  ]; fatigue [n  ]; nausea [  ]; night sweats [  ]; fever [  ]; or chills [  ];          Dental: poor dentition[  ]; Last Dentist visit:   Eye : blurred vision [ n ]; diplopia [  n ]; vision changes [ n ];  Amaurosis fugax[n  ]; Resp: cough [  ];  wheezing[n  ];  hemoptysis[n  ]; shortness of breath[ n ]; paroxysmal nocturnal dyspnea[  ]; dyspnea on exertion[  ]; or orthopnea[  ];  GI:  gallstones[  ], vomiting[  ];  dysphagia[  ]; melena[  ];  hematochezia [  ]; heartburn[  ];   Hx of  Colonoscopy[  ]; GU: kidney stones [  ]; hematuria[  ];   dysuria [  ];  nocturia[  ];  history of     obstruction [  ]; urinary frequency [  ]             Skin: rash, swelling[  ];, hair loss[  ];  peripheral edema[  ];  or itching[  ]; Musculosketetal: myalgias[  ];   joint swelling[  ];  joint erythema[  ];  joint pain[  ];  back pain[  ];  Heme/Lymph: bruising[  ];  bleeding[  ];  anemia[  ];  Neuro: TIA[n  ];  headaches[  ];  stroke[  ];  vertigo[  ];  seizures[  ];   paresthesias[ n ];  difficulty walking[n  ];  Psych:depression[ n ]; anxiety[  ];  Endocrine: diabetes[  ];  thyroid dysfunction[  ];  Immunizations: Flu up to date [  ]; Pneumococcal up to date [  ];  Other:  Physical Exam: BP 180/88  Pulse 64  Resp 16  Ht 5' (1.524 m)  Wt 214 lb (97.07 kg)  BMI 41.79 kg/m2  SpO2 97%  PHYSICAL EXAMINATION:  General appearance: alert, cooperative, appears stated age and no distress Neurologic: intact Heart: systolic murmur: early systolic 3/6, crescendo at 2nd right intercostal space Lungs: clear to auscultation bilaterally Abdomen: soft, non-tender; bowel sounds normal; no masses,  no organomegaly Extremities: extremities normal, atraumatic, no cyanosis or edema and Homans sign is negative, no sign of DVT Patient has no carotid bruits, has full and equal brachial radial and pedal pulses Has no cervical or supraclavicular adenopathy  Diagnostic Studies & Laboratory data:     Recent Radiology Findings:  EXAM:  CT ANGIOGRAPHY CHEST WITH CONTRAST  TECHNIQUE:  Multidetector CT imaging of the chest was performed using the  standard protocol during bolus administration of intravenous  contrast. Multiplanar CT image reconstructions and MIPs were  obtained to evaluate the vascular anatomy.  CONTRAST: OMNIPAQUE IOHEXOL 350 MG/ML SOLN IV  COMPARISON: None  FINDINGS:  Fusiform aneurysmal dilatation ascending thoracic aorta 4.8 x 4.6 cm  image 35.  Aortic arch and descending thoracic  aorta normal caliber.  Mild scattered atherosclerotic plaque formation thoracic aorta.  No evidence of aortic dissection or saccular aneurysm.  Plaque identified at origin of the celiac artery.  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  Pulmonary arteries well opacified and patent.  Visualized upper abdomen otherwise unremarkable.  No thoracic adenopathy.  Lungs clear.  No pleural effusion or pneumothorax.  Osseous structures unremarkable.  Review of the MIP images confirms the above findings.  IMPRESSION:  Fusiform aneurysmal dilatation ascending thoracic aorta measuring a  maximum of 4.8 x 4.6 cm.  Minimal scattered atherosclerotic calcifications.  Electronically Signed  By: Ulyses Southward [name]  On: 10/21/2013 09:50  No cardiac cath  Recent Lab Findings: Lab Results  Component Value Date   GLUCOSE 95 09/30/2013   CHOL 235* 09/30/2013   TRIG 147 09/30/2013   HDL 50 09/30/2013   LDLCALC 156* 09/30/2013   ALT 15 09/30/2013   AST 15 09/30/2013   NA 140 09/30/2013   K 4.2 09/30/2013   CL 100 09/30/2013   CREATININE 0.78 09/30/2013   BUN 15 09/30/2013   CO2 24 09/30/2013    Study Conclusions  - Left ventricle: The cavity size was normal. Wall thickness was increased in a pattern of mild LVH. Systolic function was vigorous. The estimated ejection fraction was in the range of 65% to 70%. Wall motion was normal; there were no regional wall motion abnormalities. Doppler parameters are consistent with abnormal left ventricular relaxation (grade 1 diastolic dysfunction). - Aortic valve: Bicuspid; moderately calcified leaflets. Cusp separation was reduced. There was moderate to severe stenosis. There was mild regurgitation. Mean gradient (S): 21 mm Hg. LVOT/AV VTI ratio 0.32. Peak gradient (S): 40 mm Hg. Valve area (VTI): 0.93 cm^2. Valve area (Vmax): 0.96 cm^2. - Ascending aorta: The ascending aorta was moderately dilated. Approximately 46 mm. - Mitral valve: Calcified annulus. There was trivial regurgitation. - Right atrium: Central venous pressure (est): 3 mm Hg. - Atrial septum: No defect or patent foramen ovale was identified. - Tricuspid valve: There was trivial regurgitation. - Pulmonary  arteries: PA peak pressure: 29 mm Hg (S). - Pericardium, extracardiac: There was no pericardial effusion  Peak velocity aortic valve 315 cm/sec   Aortic Size Index=    4.6     /Body surface area is 2.03 meters squared. = 2.2  < 2.75 cm/m2      4% risk per year 2.75 to 4.25          8% risk per year > 4.25 cm/m2    20% risk per year   Assessment / Plan:   Patient with asymptomatic moderate to severe aortic stenosis, peak velocity across the aortic valve estimated at 315 cm/s probable bicuspid aortic valve with the aorta dilated to 4.6 cm. -Up discussed with the patient the findings. With the size of her aorta at 4.6 even considering her short stature it is reasonable to followup with a repeat CT scan of the chest in 6 months. The severity of her aortic stenosis currently is asymptomatic, she needs to be followed closely as it may be the aortic stenosis determines the timing of surgical intervention. She does have poor dentition I encouraged her to see her dentist now to have any dental issues addressed early. I'll plan to see her back in 6 months with a repeat CTA of the chest. I stressed to her the importance of good blood pressure control, will leave this to the discretion  of cardiology with consideration of a beta blocker with her dilated aorta are may be prudent   I spent 60 minutes counseling the patient face to face. The total time spent in the appointment was 80 minutes.  Delight Ovens MD      [address] [place] [place] [address] Office [phone]   Beeper [phone]  11/20/2013 3:58 PM

## 2013-11-20 NOTE — Patient Instructions (Signed)
Aortic Stenosis Aortic stenosis is a narrowing of the aortic valve. The aortic valve is a gatelike structure that is located between the lower left chamber of the heart (left ventricle) and the blood vessel that leads away from the heart (aorta). When the aortic valve is narrowed, it does not open all the way. This makes it hard for the heart to pump blood into the aorta and causes the heart to work harder. The extra work can weaken the heart over time and lead to heart failure. CAUSES  Causes of aortic stenosis include:  Calcium deposits on the aortic valve that have made the valve stiff. This condition generally affects those over the age of 43. It is the most common cause of aortic stenosis.  A birth defect.  Rheumatic fever. This is a problem that may occur after a strep throat infection that was not treated adequately. Rheumatic fever can cause permanent damage to heart valves. SIGNS AND SYMPTOMS  People with aortic stenosis usually have no symptoms until the condition becomes severe. It may take 10-20 years for mild or moderate aortic stenosis to become severe. Symptoms may include:   Shortness of breath, especially with physical activity.   Feeling weak and tired (fatigued) or getting tired easily.  Chest discomfort (angina). This may occur with minimal activity if the aortic stenosis is severe.  An irregular or faster-than-normal heartbeat.  Dizziness or fainting that happens with exertion or after taking certain heart medicines (such as nitroglycerin). DIAGNOSIS  Aortic stenosis is usually diagnosed with a physical exam and with a type of imaging test called echocardiography. During echocardiography, sound waves are used to evaluate how blood flows through the heart. If your health care provider suspects aortic stenosis but the test does not clearly show it, a procedure called cardiac catheterization may be done to diagnose the condition. Tests may also be done to evaluate heart  function. They may include:  Electrocardiography. During this test, the electrical impulses of the heart are recorded while you are lying down and sticky patches are placed on your chest, arms, and legs.  Stress tests. There is more than one type of stress test. If a stress test is needed, ask your health care provider about which type is best for you.  Blood tests. TREATMENT  Treatment depends on how severe the aortic stenosis is, your symptoms, and the problems it is causing.   Observation. If the aortic stenosis is mild, no treatment may be needed. However, you will need to have the condition checked regularly to make sure it is not getting worse or causing serious problems.  Surgery. Surgery to repair or replace the aortic valve is the most common treatment for aortic stenosis. Several types of surgeries are available. The most common are open-heart surgery and transcutaneous aortic valve replacement (TAVR). TAVR does not require that the chest be opened. It is usually performed on elderly patients and those who are not able to have open-heart surgery.  Medicines. Medicines may be given to keep symptoms from getting worse. Medicines cannot reverse aortic stenosis. HOME CARE INSTRUCTIONS   You may need to avoid certain types of physical activity. If your aortic stenosis is mild, you may need to avoid only strenuous activity. The more severe your aortic stenosis, the more activities you will need to avoid. Talk with your health care provider about the types of activity you should avoid.  Take medicines only as directed by your health care provider.  If you are a woman with  aortic valve stenosis and want to get pregnant, talk to your health care provider before you become pregnant.  If you are a woman with aortic valve stenosis and are pregnant, keep all follow-up visits with all recommended health care providers.  Keep all follow-up visits for tests, exams, and treatments as directed by  your health care provider. SEEK IMMEDIATE MEDICAL CARE IF:  You develop chest pain or tightness.   You develop shortness of breath or difficulty breathing.   You develop light-headedness or faint.   It feels like your heartbeat is irregular or faster than normal.  You have a fever. Document Released: 11/19/2002 Document Revised: 07/07/2013 Document Reviewed: 02/15/2012 [facility] Patient Information 2015 [place], [place]. This information is not intended to replace advice given to you by your health care provider. Make sure you discuss any questions you have with your health care provider. Thoracic Aortic Aneurysm An aneurysm is a bulge in an artery. It happens when the wall of the artery is weakened or damaged. If the aneurysm gets too big, it bursts (ruptures) and severe bleeding occurs. A thoracic aortic aneurysm is an aneurysm that occurs in the first part of the aorta, between the heart and the diaphragm. The aorta is the main artery and supplies blood from the heart to the rest of the body. A thoracic aortic aneurysm can enlarge and rupture or blood can flow between the layers of the wall of the aorta through a tear (aorticdissection). Both of these conditions can cause bleeding inside the body and can be life threatening unless diagnosed and treated promptly. CAUSES  The exact cause of a thoracic aortic aneurysm is often unknown. Some contributing factors are:   A hardening of the arteries caused by the buildup of fat and other substances in the lining of a blood vessel (arteriosclerosis).  Inflammation of the walls of an artery (arteritis).  Connective tissue diseases, such as Marfan syndrome.  Injury or trauma to the aorta.  An infection, such as syphilis or staphylococcus, in the wall of the aorta (infectious aortitis) caused by bacteria. RISK FACTORS  Risk factors that contribute to a thoracic aortic aneurysm may include:  Age older than 60 years.  High blood pressure  (hypertension).  Female gender.  Ethnicity (white race).  Obesity.  Family history of aneurysm (first degree relatives only).  Tobacco use. PREVENTION  The following healthy lifestyle habits may help decrease your risk of a thoracic aortic aneurysm:  Quitting smoking. Smoking can raise your blood pressure and cause arteriosclerosis.  Limiting or avoiding alcohol.  Keeping your blood pressure, blood sugar level, and cholesterol levels within normal limits.  Decreasing your salt intake. In some people, too much salt can raise blood pressure and increase your risk of abdominal aortic aneurysm.  Eating a diet low in saturated fats and cholesterol.  Increasing your fiber intake by including whole grains, vegetables, and fruits in your diet. Eating these foods may help lower blood pressure.  Maintaining a healthy weight.  Staying physically active and exercising regularly. SYMPTOMS  The symptoms of thoracic aortic aneurysm may vary depending on the size and rate of growth of the aneurysm. Most grow slowly and do not have any symptoms. When symptoms do occur, they may include:  Pain (chest, back, sides, or abdomen). The pain may vary in intensity. A sudden onset of severe pain may indicate that the aneurysm has ruptured.  Hoarseness.  Cough.  Shortness of breath.  Swallowing problems.  Nausea or vomiting or both. DIAGNOSIS  Since most unruptured thoracic aortic aneurysms have no symptoms, they are often discovered during diagnostic exams for other conditions. An aneurysm may be found during the following procedures:  Ultrasonography (a one-time screening for thoracic aortic aneurysm by ultrasonography is also recommended for all men aged 65-75 years who have ever smoked).  X-ray exams.  A CT scan.  An MRI.  Angiography or arteriography. TREATMENT  Treatment of a thoracic aortic aneurysm depends on the size of your aneurysm, your age, and risk factors for rupture.  Medicine to control blood pressure and pain may be used to manage aneurysms smaller than 2.3 in (6 cm). Regular monitoring for enlargement may be recommended by your health care provider if:  The aneurysm is 1.2-1.5 in (3-4 cm) in size (an annual ultrasonography may be recommended).  The aneurysm is 1.5-1.8 in (4-4.5 cm) in size (an ultrasonography every 6 months may be recommended).  The aneurysm is larger than 1.8 in (4.5 cm) in size (your health care provider may ask that you be examined by a vascular surgeon). If your aneurysm is larger than 2.2 in (5.5 cm) or if it is enlarging quickly, surgical repair may be recommended. There are two main methods for repair of an aneurysm:   Endovascular repair (a minimally invasive surgery).  Open repair. This method is used if an endovascular repair is not possible. Document Released: 02/20/2005 Document Revised: 12/11/2012 Document Reviewed: 09/02/2012 [facility] Patient Information 2015 [place], [place]. This information is not intended to replace advice given to you by your health care provider. Make sure you discuss any questions you have with your health care provider.  Aortic Valve Replacement  Aortic valve replacement is a procedure to replace an aortic valve that cannot be repaired. An artificial (prosthetic) valve is used to do this. Three types of prosthetic valves are available:   Mechanical valves made entirely from man-made materials.   Donor valves made from human donors. These are only used in special situations.   Biological valves made from animal tissues.  The type of prosthetic valve used will be determined based on various factors, including your age, your lifestyle, and other medical conditions you have.  LET [facility] CARE PROVIDER KNOW ABOUT:  Any allergies you have.  All medicines you are taking, including vitamins, herbs, eye drops, creams, and over-the-counter medicines.  Previous problems you or members of your  family have had with the use of anesthetics.  Any blood disorders you have.  Previous surgeries you have had.  Medical conditions you have. RISKS AND COMPLICATIONS Generally, this is a safe procedure. However, as with any procedure, problems can occur. Possible problems include:   Blood clotting caused by the new valve. Replacement with a mechanical valve requires lifelong treatment with medicine to prevent blood clots.   Infection in the new valve.   Valve failure.   Bleeding.   Reaction to anesthetics.  BEFORE THE PROCEDURE  Ask your health care provider about:  Changing or stopping your regular medicines. This is especially important if you are taking diabetes medicines or blood thinners.  Taking medicines such as aspirin and ibuprofen. These medicines can thin your blood. Do not take these medicines before your procedure if your health care provider asks you not to.  Do not eat or drink anything after midnight on the night before the procedure or as directed by your health care provider. PROCEDURE  The surgeon may use either an open technique or a minimally invasive technique for this surgery:  Traditional  open surgery  You will be given a medicine that makes you fall asleep (general anesthetic).  You will then be placed on a heart-lung bypass machine. This machine provides oxygen to your blood while the heart is undergoing surgery.  During surgery, the surgeon will make a large cut (incision) in the chest.  The heart will be cooled to slow or stop the heartbeat.  The damaged aortic valve will be removed and replaced with a prosthetic heart valve.  The incision will then be closed with staples or stitches. Minimally invasive surgery This is done through a smaller incision. This still requires general anesthetic and the heart-lung bypass machine. Your heart will be cooled to slow or stop the heartbeat, allowing the damaged valve to be removed and replaced with the  new valve. The smaller incision will then be closed. If your condition allows for this procedure, there is often less blood loss, less pain, and faster recovery compared to traditional open surgery.  AFTER THE PROCEDURE You will be monitored closely in a recovery area. From there, you will likely go to an intensive care unit.  Document Released: 07/12/2004 Document Revised: 07/07/2013 Document Reviewed: 07/30/2012 [facility] Patient Information 2015 [place], [place]. This information is not intended to replace advice given to you by your health care provider. Make sure you discuss any questions you have with your health care provider.

## 2014-04-01 ENCOUNTER — Ambulatory Visit: Payer: Medicare Other | Admitting: Nurse Practitioner

## 2014-04-15 ENCOUNTER — Other Ambulatory Visit: Payer: Self-pay | Admitting: *Deleted

## 2014-04-15 DIAGNOSIS — I7121 Aneurysm of the ascending aorta, without rupture: Secondary | ICD-10-CM

## 2014-04-15 DIAGNOSIS — I712 Thoracic aortic aneurysm, without rupture: Secondary | ICD-10-CM

## 2014-04-29 ENCOUNTER — Ambulatory Visit (INDEPENDENT_AMBULATORY_CARE_PROVIDER_SITE_OTHER): Payer: Medicare Other | Admitting: Nurse Practitioner

## 2014-04-29 ENCOUNTER — Encounter: Payer: Self-pay | Admitting: Nurse Practitioner

## 2014-04-29 VITALS — BP 127/78 | HR 66 | Temp 97.2°F | Ht 60.0 in | Wt 208.0 lb

## 2014-04-29 DIAGNOSIS — I350 Nonrheumatic aortic (valve) stenosis: Secondary | ICD-10-CM | POA: Diagnosis not present

## 2014-04-29 DIAGNOSIS — E785 Hyperlipidemia, unspecified: Secondary | ICD-10-CM | POA: Diagnosis not present

## 2014-04-29 DIAGNOSIS — Q231 Congenital insufficiency of aortic valve: Secondary | ICD-10-CM | POA: Diagnosis not present

## 2014-04-29 DIAGNOSIS — Q230 Congenital stenosis of aortic valve: Secondary | ICD-10-CM

## 2014-04-29 DIAGNOSIS — I10 Essential (primary) hypertension: Secondary | ICD-10-CM | POA: Diagnosis not present

## 2014-04-29 MED ORDER — BENAZEPRIL-HYDROCHLOROTHIAZIDE 20-12.5 MG PO TABS: 1.0000 | ORAL_TABLET | Freq: Every day | ORAL | Status: DC

## 2014-04-29 NOTE — Addendum Note (Signed)
Addended by: Bennie PieriniMARTIN, MARY-MARGARET on: 04/29/2014 02:53 PM   Modules accepted: Orders

## 2014-04-29 NOTE — Patient Instructions (Signed)
Fat and Cholesterol Control Diet Fat and cholesterol levels in your blood and organs are influenced by your diet. High levels of fat and cholesterol may lead to diseases of the heart, small and large blood vessels, gallbladder, liver, and pancreas. CONTROLLING FAT AND CHOLESTEROL WITH DIET Although exercise and lifestyle factors are important, your diet is key. That is because certain foods are known to raise cholesterol and others to lower it. The goal is to balance foods for their effect on cholesterol and more importantly, to replace saturated and trans fat with other types of fat, such as monounsaturated fat, polyunsaturated fat, and omega-3 fatty acids. On average, a person should consume no more than 15 to 17 g of saturated fat daily. Saturated and trans fats are considered "bad" fats, and they will raise LDL cholesterol. Saturated fats are primarily found in animal products such as meats, butter, and cream. However, that does not mean you need to give up all your favorite foods. Today, there are good tasting, low-fat, low-cholesterol substitutes for most of the things you like to eat. Choose low-fat or nonfat alternatives. Choose round or loin cuts of red meat. These types of cuts are lowest in fat and cholesterol. Chicken (without the skin), fish, veal, and ground turkey breast are great choices. Eliminate fatty meats, such as hot dogs and salami. Even shellfish have little or no saturated fat. Have a 3 oz (85 g) portion when you eat lean meat, poultry, or fish. Trans fats are also called "partially hydrogenated oils." They are oils that have been scientifically manipulated so that they are solid at room temperature resulting in a longer shelf life and improved taste and texture of foods in which they are added. Trans fats are found in stick margarine, some tub margarines, cookies, crackers, and baked goods.  When baking and cooking, oils are a great substitute for butter. The monounsaturated oils are  especially beneficial since it is believed they lower LDL and raise HDL. The oils you should avoid entirely are saturated tropical oils, such as coconut and palm.  Remember to eat a lot from food groups that are naturally free of saturated and trans fat, including fish, fruit, vegetables, beans, grains (barley, rice, couscous, bulgur wheat), and pasta (without cream sauces).  IDENTIFYING FOODS THAT LOWER FAT AND CHOLESTEROL  Soluble fiber may lower your cholesterol. This type of fiber is found in fruits such as apples, vegetables such as broccoli, potatoes, and carrots, legumes such as beans, peas, and lentils, and grains such as barley. Foods fortified with plant sterols (phytosterol) may also lower cholesterol. You should eat at least 2 g per day of these foods for a cholesterol lowering effect.  Read package labels to identify low-saturated fats, trans fat free, and low-fat foods at the supermarket. Select cheeses that have only 2 to 3 g saturated fat per ounce. Use a heart-healthy tub margarine that is free of trans fats or partially hydrogenated oil. When buying baked goods (cookies, crackers), avoid partially hydrogenated oils. Breads and muffins should be made from whole grains (whole-wheat or whole oat flour, instead of "flour" or "enriched flour"). Buy non-creamy canned soups with reduced salt and no added fats.  FOOD PREPARATION TECHNIQUES  Never deep-fry. If you must fry, either stir-fry, which uses very little fat, or use non-stick cooking sprays. When possible, broil, bake, or roast meats, and steam vegetables. Instead of putting butter or margarine on vegetables, use lemon and herbs, applesauce, and cinnamon (for squash and sweet potatoes). Use nonfat   yogurt, salsa, and low-fat dressings for salads.  LOW-SATURATED FAT / LOW-FAT FOOD SUBSTITUTES Meats / Saturated Fat (g)  Avoid: Steak, marbled (3 oz/85 g) / 11 g  Choose: Steak, lean (3 oz/85 g) / 4 g  Avoid: Hamburger (3 oz/85 g) / 7  g  Choose: Hamburger, lean (3 oz/85 g) / 5 g  Avoid: Ham (3 oz/85 g) / 6 g  Choose: Ham, lean cut (3 oz/85 g) / 2.4 g  Avoid: Chicken, with skin, dark meat (3 oz/85 g) / 4 g  Choose: Chicken, skin removed, dark meat (3 oz/85 g) / 2 g  Avoid: Chicken, with skin, light meat (3 oz/85 g) / 2.5 g  Choose: Chicken, skin removed, light meat (3 oz/85 g) / 1 g Dairy / Saturated Fat (g)  Avoid: Whole milk (1 cup) / 5 g  Choose: Low-fat milk, 2% (1 cup) / 3 g  Choose: Low-fat milk, 1% (1 cup) / 1.5 g  Choose: Skim milk (1 cup) / 0.3 g  Avoid: Hard cheese (1 oz/28 g) / 6 g  Choose: Skim milk cheese (1 oz/28 g) / 2 to 3 g  Avoid: Cottage cheese, 4% fat (1 cup) / 6.5 g  Choose: Low-fat cottage cheese, 1% fat (1 cup) / 1.5 g  Avoid: Ice cream (1 cup) / 9 g  Choose: Sherbet (1 cup) / 2.5 g  Choose: Nonfat frozen yogurt (1 cup) / 0.3 g  Choose: Frozen fruit bar / trace  Avoid: Whipped cream (1 tbs) / 3.5 g  Choose: Nondairy whipped topping (1 tbs) / 1 g Condiments / Saturated Fat (g)  Avoid: Mayonnaise (1 tbs) / 2 g  Choose: Low-fat mayonnaise (1 tbs) / 1 g  Avoid: Butter (1 tbs) / 7 g  Choose: Extra light margarine (1 tbs) / 1 g  Avoid: Coconut oil (1 tbs) / 11.8 g  Choose: Olive oil (1 tbs) / 1.8 g  Choose: Corn oil (1 tbs) / 1.7 g  Choose: Safflower oil (1 tbs) / 1.2 g  Choose: Sunflower oil (1 tbs) / 1.4 g  Choose: Soybean oil (1 tbs) / 2.4 g  Choose: Canola oil (1 tbs) / 1 g Document Released: 02/20/2005 Document Revised: 06/17/2012 Document Reviewed: 05/21/2013 Exit[employer] Patient Information 2015 [employer]. This information is not intended to replace advice given to you by your health care provider. Make sure you discuss any questions you have with your health care provider.  

## 2014-04-29 NOTE — Progress Notes (Addendum)
  Subjective:    Patient ID: Angela Dawson, female    DOB: 10-13-1945, 69 y.o.   MRN: [id number]  Patient is here today for chronic disease follow up.   Hypertension This is a chronic problem. The current episode started more than 1 year ago. The problem is unchanged. The problem is controlled. Pertinent negatives include no chest pain, headaches or shortness of breath. Risk factors for coronary artery disease include dyslipidemia and post-menopausal state. Past treatments include ACE inhibitors and diuretics. The current treatment provides moderate improvement. Compliance problems include diet and exercise.   Hyperlipidemia This is a chronic problem. The current episode started more than 1 year ago. The problem is controlled. Exacerbating diseases include obesity. Pertinent negatives include no chest pain or shortness of breath. The current treatment provides moderate improvement of lipids. There are no compliance problems.  Risk factors for coronary artery disease include dyslipidemia, family history, hypertension, obesity and post-menopausal.   * patient stop taking lipitor due to side effect.    Review of Systems  Constitutional: Negative.   HENT: Negative.   Respiratory: Negative for shortness of breath.   Cardiovascular: Negative for chest pain.  Genitourinary: Negative.   Neurological: Negative for headaches.  Psychiatric/Behavioral: Negative.   All other systems reviewed and are negative.      Objective:   Physical Exam  Constitutional: She is oriented to person, place, and time. She appears well-developed and well-nourished.  HENT:  Nose: Nose normal.  Mouth/Throat: Oropharynx is clear and moist.  Eyes: EOM are normal.  Neck: Trachea normal, normal range of motion and full passive range of motion without pain. Neck supple. No JVD present. Carotid bruit is not present. No thyromegaly present.  Cardiovascular: Normal rate, regular rhythm and intact distal pulses.  Exam reveals  no gallop and no friction rub.   Murmur (1/6 systolic murmur) heard. Pulmonary/Chest: Effort normal and breath sounds normal.  Abdominal: Soft. Bowel sounds are normal. She exhibits no distension and no mass. There is no tenderness.  Musculoskeletal: Normal range of motion.  Lymphadenopathy:    She has no cervical adenopathy.  Neurological: She is alert and oriented to person, place, and time. She has normal reflexes.  Skin: Skin is warm and dry.  Psychiatric: She has a normal mood and affect. Her behavior is normal. Judgment and thought content normal.   BP 127/78 mmHg  Pulse 66  Temp(Src) 97.2 F (36.2 C) (Oral)  Ht 5' (1.524 m)  Wt 208 lb (94.348 kg)  BMI 40.62 kg/m2         Assessment & Plan:   1. Essential hypertension Do not add salt to diet - benazepril-hydrochlorthiazide (LOTENSIN HCT) 20-12.5 MG per tablet; Take 1 tablet by mouth daily.  Dispense: 90 tablet; Refill: 1  2. Severe obesity (BMI >= 40) Discussed diet and exercise for person with BMI >25 Will recheck weight in 3-6 months   3. Hyperlipidemia Low fat diet Cannot tolerate statins  4. Aortic stenosis due to bicuspid aortic valve Keep follow up appointment with cardiologist  Orders Placed This Encounter  Procedures  . CMP14+EGFR  . NMR, lipoprofile     Labs pending Health maintenance reviewed Diet and exercise encouraged Continue all meds Follow up  In 6 month   Kappa, FNP

## 2014-04-30 LAB — CMP14+EGFR
A/G RATIO: 1.6 (ref 1.1–2.5)
ALBUMIN: 4.2 g/dL (ref 3.6–4.8)
ALT: 14 IU/L (ref 0–32)
AST: 14 IU/L (ref 0–40)
Alkaline Phosphatase: 61 IU/L (ref 39–117)
BILIRUBIN TOTAL: 0.4 mg/dL (ref 0.0–1.2)
BUN/Creatinine Ratio: 24 (ref 11–26)
BUN: 19 mg/dL (ref 8–27)
CO2: 26 mmol/L (ref 18–29)
Calcium: 9.5 mg/dL (ref 8.7–10.3)
Chloride: 99 mmol/L (ref 97–108)
Creatinine, Ser: 0.79 mg/dL (ref 0.57–1.00)
GFR calc non Af Amer: 78 mL/min/{1.73_m2} (ref >59)
GFR, EST AFRICAN AMERICAN: 90 mL/min/{1.73_m2} (ref >59)
Globulin, Total: 2.6 g/dL (ref 1.5–4.5)
Glucose: 89 mg/dL (ref 65–99)
Potassium: 4.1 mmol/L (ref 3.5–5.2)
Sodium: 142 mmol/L (ref 134–144)
Total Protein: 6.8 g/dL (ref 6.0–8.5)

## 2014-04-30 LAB — NMR, LIPOPROFILE
CHOLESTEROL: 231 mg/dL — AB (ref 100–199)
HDL Cholesterol by NMR: 51 mg/dL (ref >39)
HDL Particle Number: 32.5 umol/L (ref >=30.5)
LDL Particle Number: 1670 nmol/L — ABNORMAL HIGH (ref <1000)
LDL SIZE: 21.3 nm (ref >20.5)
LDL-C: 150 mg/dL — ABNORMAL HIGH (ref 0–99)
LP-IR SCORE: 56 — AB (ref <=45)
Small LDL Particle Number: 534 nmol/L — ABNORMAL HIGH (ref <=527)
Triglycerides by NMR: 150 mg/dL — ABNORMAL HIGH (ref 0–149)

## 2014-05-01 ENCOUNTER — Telehealth: Payer: Self-pay | Admitting: Nurse Practitioner

## 2014-05-01 NOTE — Telephone Encounter (Signed)
BMP was already ordered. Labs faxed to Dr Lowella FairyGerhart @ [phone] Pt has appt with him on 06/04/2014

## 2014-05-05 LAB — HM MAMMOGRAPHY

## 2014-05-26 ENCOUNTER — Ambulatory Visit (INDEPENDENT_AMBULATORY_CARE_PROVIDER_SITE_OTHER): Payer: Medicare Other | Admitting: Physician Assistant

## 2014-05-26 ENCOUNTER — Encounter: Payer: Self-pay | Admitting: Physician Assistant

## 2014-05-26 VITALS — BP 155/78 | HR 82 | Temp 97.9°F | Ht 60.0 in | Wt 212.0 lb

## 2014-05-26 DIAGNOSIS — R509 Fever, unspecified: Secondary | ICD-10-CM | POA: Diagnosis not present

## 2014-05-26 DIAGNOSIS — R062 Wheezing: Secondary | ICD-10-CM

## 2014-05-26 DIAGNOSIS — J020 Streptococcal pharyngitis: Secondary | ICD-10-CM | POA: Diagnosis not present

## 2014-05-26 DIAGNOSIS — J309 Allergic rhinitis, unspecified: Secondary | ICD-10-CM

## 2014-05-26 LAB — POCT INFLUENZA A/B
INFLUENZA B, POC: NEGATIVE
Influenza A, POC: NEGATIVE

## 2014-05-26 LAB — POCT RAPID STREP A (OFFICE): Rapid Strep A Screen: NEGATIVE

## 2014-05-26 MED ORDER — ALBUTEROL SULFATE HFA 108 (90 BASE) MCG/ACT IN AERS: 2.0000 | INHALATION_SPRAY | Freq: Four times a day (QID) | RESPIRATORY_TRACT | Status: DC | PRN

## 2014-05-26 MED ORDER — CETIRIZINE HCL 10 MG PO TABS: 10.0000 mg | ORAL_TABLET | Freq: Every day | ORAL | Status: DC

## 2014-05-26 MED ORDER — FLUTICASONE PROPIONATE 50 MCG/ACT NA SUSP: 2.0000 | Freq: Every day | NASAL | Status: DC

## 2014-05-26 NOTE — Progress Notes (Signed)
   Subjective:    Patient ID: Angela MayoLinda M Raschke, female    DOB: 02/02/1946, 69 y.o.   MRN: [id number]  HPI  69 y/o female presents with cough ( productive), fever 3 days ago, sneezing x 4 days. Has tried Robitussin for cough relief with no relief.    Review of Systems  Constitutional: Positive for fever (3 days ago) and fatigue. Negative for chills and diaphoresis.  HENT: Positive for postnasal drip and sneezing. Negative for congestion, ear pain, rhinorrhea, sinus pressure and sore throat.   Eyes: Negative.   Respiratory: Positive for cough (productive, worse at night), shortness of breath (chronic) and wheezing (chronic). Negative for apnea, choking and chest tightness.   Cardiovascular: Negative.   Gastrointestinal: Negative.   Musculoskeletal: Positive for myalgias (bilateral flanks due to coughing).       Objective:   Physical Exam  Constitutional: She is oriented to person, place, and time. She appears well-developed and well-nourished.  Strep and flu test negative  Obese   HENT:  Right Ear: External ear normal.  Left Ear: External ear normal.  Mouth/Throat: No oropharyngeal exudate.  Posterior ;pharynx erythema and injection bilaterally, nonedematous  Eyes: Right eye exhibits no discharge. Left eye exhibits no discharge.  Cardiovascular: Normal rate.   Murmur heard. Pulmonary/Chest: Effort normal. No respiratory distress. She has wheezes (bilateral expiratory ). She has no rales. She exhibits no tenderness.  Lymphadenopathy:    She has no cervical adenopathy.  Neurological: She is alert and oriented to person, place, and time.  Nursing note and vitals reviewed.         Assessment & Plan:  1. Allergic Rhinitis: Zyrtec 10mg  qhs, Flonase nasal spray as directed in am.  2. Cough with wheeze: Cough appears to be from PND, however patient has expiratory wheeze that she states is baseline for her. I will prescribe ProAir inhaler to use q 6 hours until her Upper respiratory  symptoms have subsided. She has f/u in April 2016 with MMM NP and she will address long term inhaler at that time if wheeze is still present on PE.  - Patient does not prefer narcotic cough medicine and states that she will purchase Zarbee's or Delsym otc - Advised patient to return to clinic if cough, SOB worsens or fever recurs for possible antibiotic treatment if needed.

## 2014-06-02 DIAGNOSIS — Z1231 Encounter for screening mammogram for malignant neoplasm of breast: Secondary | ICD-10-CM | POA: Diagnosis not present

## 2014-06-04 ENCOUNTER — Encounter: Payer: Self-pay | Admitting: Cardiothoracic Surgery

## 2014-06-04 ENCOUNTER — Ambulatory Visit
Admission: RE | Admit: 2014-06-04 | Discharge: 2014-06-04 | Disposition: A | Discharge status: Home / Self Care | Payer: Medicare Other | Source: Ambulatory Visit | Attending: Cardiothoracic Surgery | Admitting: Cardiothoracic Surgery

## 2014-06-04 ENCOUNTER — Ambulatory Visit (INDEPENDENT_AMBULATORY_CARE_PROVIDER_SITE_OTHER): Payer: Medicare Other | Admitting: Cardiothoracic Surgery

## 2014-06-04 VITALS — BP 110/63 | HR 75 | Resp 20 | Ht 60.0 in | Wt 210.0 lb

## 2014-06-04 DIAGNOSIS — I77810 Thoracic aortic ectasia: Secondary | ICD-10-CM

## 2014-06-04 DIAGNOSIS — Q231 Congenital insufficiency of aortic valve: Secondary | ICD-10-CM

## 2014-06-04 DIAGNOSIS — Q230 Congenital stenosis of aortic valve: Secondary | ICD-10-CM

## 2014-06-04 DIAGNOSIS — I350 Nonrheumatic aortic (valve) stenosis: Secondary | ICD-10-CM

## 2014-06-04 DIAGNOSIS — I712 Thoracic aortic aneurysm, without rupture: Secondary | ICD-10-CM | POA: Diagnosis not present

## 2014-06-04 DIAGNOSIS — I7121 Aneurysm of the ascending aorta, without rupture: Secondary | ICD-10-CM

## 2014-06-04 MED ORDER — IOPAMIDOL (ISOVUE-370) INJECTION 76%
75.0000 mL | Freq: Once | INTRAVENOUS | Status: AC | PRN
  Administered 2014-06-04: 75 mL via INTRAVENOUS

## 2014-06-04 NOTE — Progress Notes (Signed)
[address]       [place] [address]             [phone]                    Angela Dawson [facility] Medical Record #[id number] Date of Birth: Nov 06, 1945  Referring: Antoine Poche, MD Primary Care: Rudi Heap, MD  Chief Complaint:    Chief Complaint  Patient presents with  . Thoracic Aortic Aneurysm    6 month f/u with Chest CTA    History of Present Illness:    Angela Dawson 69 y.o. female is seen in the office 6 months ago for evaluation   moderately dilated ascending aorta. She has also noted to have moderate  aortic stenosis  of a bicuspid aortic valve. The patient is asymptomatic from a cardiac standpoint, she denies shortness of breath, PND, pedal edema, syncope, presyncope. She notes she works on a regular basis , and cares for her yard including mowing the grass with a push mower. She was noted on routine exam by nurse practitioner student to have a murmur. This led to evaluation by cardiology and a CT scan of the chest and echocardiogram.   Other than hypertension the patient has no previous history of cardiac disease She does have a family history of cardiac disease her father died of myocardial infarction age 54    Current Activity/ Functional Status:  Patient is independent with mobility/ambulation, transfers, ADL's, IADL's.   Zubrod Score: At the time of surgery this patient's most appropriate activity status/level should be described as:     0    Normal activity, no symptoms     1    Restricted in physical strenuous activity but ambulatory, able to do out light work     2    Ambulatory and capable of self care, unable to do work activities, up and about               >50 % of waking hours                                  3    Only limited self care, in bed greater than 50% of waking hours     4    Completely disabled, no self care, confined to bed or chair     5    Moribund   Past Medical History  Diagnosis  Date  . Hypertension     Past Surgical History  Procedure Laterality Date  . Tubal ligation      Family History  Problem Relation Age of Onset  . Alzheimer's disease Mother   . Heart attack Father       History  Smoking status  . Former Smoker  . Quit date: 10/10/1973  Smokeless tobacco  . Not on file    Comment: smoked less than 1 pack every 2 weeks. Havent smoked in atleast 40 years    History  Alcohol Use No     Allergies  Allergen Reactions  . Crestor [Rosuvastatin]     Dizziness  . Lipitor [Atorvastatin]     Unable to stand or move  . Penicillins Rash    Current Outpatient Prescriptions  Medication Sig Dispense Refill  . albuterol (PROVENTIL HFA;VENTOLIN HFA) 108 (90 BASE) MCG/ACT inhaler Inhale 2 puffs into the lungs every 6 (six)  hours as needed for wheezing or shortness of breath. 1 Inhaler 2  . aspirin 81 MG tablet Take 81 mg by mouth daily.    . benazepril-hydrochlorthiazide (LOTENSIN HCT) 20-12.5 MG per tablet Take 1 tablet by mouth daily. 90 tablet 1  . cetirizine (ZYRTEC) 10 MG tablet Take 1 tablet (10 mg total) by mouth daily. 30 tablet 11  . fluticasone (FLONASE) 50 MCG/ACT nasal spray Place 2 sprays into both nostrils daily. 16 g 6   No current facility-administered medications for this visit.     Review of Systems:     Cardiac Review of Systems: Y or N  Chest Pain [ n   ]  Resting SOB [n   ] Exertional SOB  [ n ]  Orthopnea [n  ]   Pedal Edema [ n  ]    Palpitations [n  ] Syncope  Milo.Brash[n  ]   Presyncope [ n  ]  General Review of Systems: [Y] = yes [  ]=no Constitional: recent weight change [  ];  Wt loss over the last 3 months [  n ] anorexia [  ]; fatigue [n  ]; nausea [  ]; night sweats [  ]; fever [  ]; or chills [  ];          Dental: poor dentition[  ]; Last Dentist visit:   Eye : blurred vision [ n ]; diplopia [  n ]; vision changes [ n ];  Amaurosis fugax[n  ]; Resp: cough [  ];  wheezing[n  ];  hemoptysis[n  ]; shortness of breath[ n ];  paroxysmal nocturnal dyspnea[  ]; dyspnea on exertion[  ]; or orthopnea[  ];  GI:  gallstones[  ], vomiting[  ];  dysphagia[  ]; melena[  ];  hematochezia [  ]; heartburn[  ];   Hx of  Colonoscopy[  ]; GU: kidney stones [  ]; hematuria[  ];   dysuria [  ];  nocturia[  ];  history of     obstruction [  ]; urinary frequency [  ]             Skin: rash, swelling[  ];, hair loss[  ];  peripheral edema[  ];  or itching[  ]; Musculosketetal: myalgias[  ];  joint swelling[  ];  joint erythema[  ];  joint pain[  ];  back pain[  ];  Heme/Lymph: bruising[  ];  bleeding[  ];  anemia[  ];  Neuro: TIA[n  ];  headaches[  ];  stroke[  ];  vertigo[  ];  seizures[  ];   paresthesias[ n ];  difficulty walking[n  ];  Psych:depression[ n ]; anxiety[  ];  Endocrine: diabetes[  ];  thyroid dysfunction[  ];  Immunizations: Flu up to date Cove.Etienne[y  ]; Pneumococcal up to date Cove.Etienne[y  ];  Other:  Physical Exam: BP 110/63 mmHg  Pulse 75  Resp 20  Ht 5' (1.524 m)  Wt 210 lb (95.255 kg)  BMI 41.01 kg/m2  SpO2 97%  PHYSICAL EXAMINATION:  General appearance: alert, cooperative, appears stated age and no distress Neurologic: intact Heart: systolic murmur: early systolic 3/6, crescendo at 2nd right intercostal space Lungs: clear to auscultation bilaterally Abdomen: soft, non-tender; bowel sounds normal; no masses,  no organomegaly Extremities: extremities normal, atraumatic, no cyanosis or edema and Homans sign is negative, no sign of DVT Patient has no carotid bruits, has full and equal brachial radial and pedal pulses Has no cervical  or supraclavicular adenopathy  Diagnostic Studies & Laboratory data:     Recent Radiology Findings:  Ct Angio Chest Aorta W/cm &/or Wo/cm  06/04/2014   CLINICAL DATA:  Aortic aneurysm  EXAM: CT ANGIOGRAPHY CHEST WITH CONTRAST  TECHNIQUE: Multidetector CT imaging of the chest was performed using the standard protocol during bolus administration of intravenous contrast. Multiplanar CT image  reconstructions and MIPs were obtained to evaluate the vascular anatomy.  CONTRAST:  75 cc Isovue 370  COMPARISON:  10/21/2013  FINDINGS: Maximal aortic diameters at the sinus of Valsalva, sino-tubular junction, and ascending aorta are 3.6 cm, 3.2 cm, and 4.9 cm respectively. Previous maximal diameter was 4.8 cm.  No evidence of aortic dissection or intramural hematoma. Moderate aortic valvular calcifications are noted.  Great vessels are patent within the confines of the study. Bilateral vertebral arteries are also patent.  No obvious evidence of acute pulmonary thromboembolism.  Minimal LAD territory coronary artery calcification. Trace left main coronary artery calcification.  No abnormal mediastinal adenopathy. Small mediastinal and bilateral axillary nodes.  Patchy ground-glass and airspace opacities in the lingula. There is a 2 mm nodule in the lingula on image 25.  No vertebral compression deformity.  Review of the MIP images confirms the above findings.  IMPRESSION: Maximal diameter of the ascending aorta is 4.9 cm compare with 4.8 cm on the prior study. No evidence of dissection.  There are airspace and ground-glass opacities in the left upper lobe most consistent with an inflammatory process. There is also a 2 mm nodule. If the patient is at high risk for bronchogenic carcinoma, follow-up chest CT at 1 year is recommended. If the patient is at low risk, no follow-up is needed. This recommendation follows the consensus statement: Guidelines for Management of Small Pulmonary Nodules Detected on CT Scans: A Statement from the Fleischner Society as published in Radiology 2005; 237:395-400.   Electronically Signed   By: Jolaine Click [name]   On: 06/04/2014 14:57    EXAM:  CT ANGIOGRAPHY CHEST WITH CONTRAST  TECHNIQUE:  Multidetector CT imaging of the chest was performed using the  standard protocol during bolus administration of intravenous  contrast. Multiplanar CT image reconstructions and MIPs were    obtained to evaluate the vascular anatomy.  CONTRAST: OMNIPAQUE IOHEXOL 350 MG/ML SOLN IV  COMPARISON: None  FINDINGS:  Fusiform aneurysmal dilatation ascending thoracic aorta 4.8 x 4.6 cm  image 35.  Aortic arch and descending thoracic aorta normal caliber.  Mild scattered atherosclerotic plaque formation thoracic aorta.  No evidence of aortic dissection or saccular aneurysm.  Plaque identified at origin of the celiac artery.  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  Pulmonary arteries well opacified and patent.  Visualized upper abdomen otherwise unremarkable.  No thoracic adenopathy.  Lungs clear.  No pleural effusion or pneumothorax.  Osseous structures unremarkable.  Review of the MIP images confirms the above findings.  IMPRESSION:  Fusiform aneurysmal dilatation ascending thoracic aorta measuring a  maximum of 4.8 x 4.6 cm.  Minimal scattered atherosclerotic calcifications.  Electronically Signed  By: Ulyses Southward [name]  On: 10/21/2013 09:50  No cardiac cath  Recent Lab Findings: Lab Results  Component Value Date   GLUCOSE 89 04/29/2014   CHOL 231* 04/29/2014   TRIG 150* 04/29/2014   HDL 51 04/29/2014   LDLCALC 156* 09/30/2013   ALT 14 04/29/2014   AST 14 04/29/2014   NA 142 04/29/2014   K 4.1 04/29/2014  CL 99 04/29/2014   CREATININE 0.79 04/29/2014   BUN 19 04/29/2014   CO2 26 04/29/2014    ECHO 09/2013 Study Conclusions  - Left ventricle: The cavity size was normal. Wall thickness was increased in a pattern of mild LVH. Systolic function was vigorous. The estimated ejection fraction was in the range of 65% to 70%. Wall motion was normal; there were no regional wall motion abnormalities. Doppler parameters are consistent with abnormal left ventricular relaxation (grade 1 diastolic dysfunction). - Aortic valve: Bicuspid; moderately calcified leaflets. Cusp separation was reduced. There was moderate to  severe stenosis. There was mild regurgitation. Mean gradient (S): 21 mm Hg. LVOT/AV VTI ratio 0.32. Peak gradient (S): 40 mm Hg. Valve area (VTI): 0.93 cm^2. Valve area (Vmax): 0.96 cm^2. - Ascending aorta: The ascending aorta was moderately dilated. Approximately 46 mm. - Mitral valve: Calcified annulus. There was trivial regurgitation. - Right atrium: Central venous pressure (est): 3 mm Hg. - Atrial septum: No defect or patent foramen ovale was identified. - Tricuspid valve: There was trivial regurgitation. - Pulmonary arteries: PA peak pressure: 29 mm Hg (S). - Pericardium, extracardiac: There was no pericardial effusion  Peak velocity aortic valve 315 cm/sec   Aortic Size Index=    4.8    /Body surface area is 2.01 meters squared. = 2.38  < 2.75 cm/m2      4% risk per year 2.75 to 4.25          8% risk per year > 4.25 cm/m2    20% risk per year   Assessment / Plan:   Patient with asymptomatic moderate  aortic stenosis, peak velocity across the aortic valve estimated at 315 cm/s probable bicuspid aortic valve with the aorta dilated to 4.8cm. -Up discussed with the patient the findings.  The severity of her aortic stenosis currently is asymptomatic, she needs to be followed closely as it may be the aortic stenosis determines the timing of surgical intervention.  I'll plan to see her back in 8 months with a repeat CTA of the chest. Since she was last seen 6 months ago she has not seen cardiology, we will make her a follow-up appointment with cardiology and repeat echocardiogram I stressed to her the importance of good blood pressure control, will leave this to the discretion of cardiology with consideration of a beta blocker with her dilated aorta are may be prudent   Delight Ovens MD      [address] [place] [address] Office [phone]   Beeper [phone]  06/04/2014 4:22 PM

## 2014-06-19 ENCOUNTER — Other Ambulatory Visit: Payer: Self-pay | Admitting: Cardiology

## 2014-06-19 ENCOUNTER — Ambulatory Visit (INDEPENDENT_AMBULATORY_CARE_PROVIDER_SITE_OTHER): Payer: Medicare Other | Admitting: Cardiology

## 2014-06-19 ENCOUNTER — Encounter: Payer: Self-pay | Admitting: Cardiology

## 2014-06-19 VITALS — BP 138/86 | HR 68 | Ht 60.0 in | Wt 211.8 lb

## 2014-06-19 DIAGNOSIS — I7121 Aneurysm of the ascending aorta, without rupture: Secondary | ICD-10-CM

## 2014-06-19 DIAGNOSIS — I350 Nonrheumatic aortic (valve) stenosis: Secondary | ICD-10-CM

## 2014-06-19 DIAGNOSIS — Q230 Congenital stenosis of aortic valve: Secondary | ICD-10-CM

## 2014-06-19 DIAGNOSIS — I10 Essential (primary) hypertension: Secondary | ICD-10-CM

## 2014-06-19 DIAGNOSIS — Q231 Congenital insufficiency of aortic valve: Secondary | ICD-10-CM

## 2014-06-19 DIAGNOSIS — I712 Thoracic aortic aneurysm, without rupture: Secondary | ICD-10-CM

## 2014-06-19 MED ORDER — ATENOLOL 25 MG PO TABS: 25.0000 mg | ORAL_TABLET | Freq: Every day | ORAL | Status: DC

## 2014-06-19 MED ORDER — PRAVASTATIN SODIUM 20 MG PO TABS: 20.0000 mg | ORAL_TABLET | Freq: Every evening | ORAL | Status: DC

## 2014-06-19 NOTE — Patient Instructions (Addendum)
Your physician wants you to follow-up in: 6 MONTHS WITH DR. BRANCH You will receive a reminder letter in the mail two months in advance. If you don't receive a letter, please call our office to schedule the follow-up appointment.  Your physician has requested that you have an echocardiogram. Echocardiography is a painless test that uses sound waves to create images of your heart. It provides your doctor with information about the size and shape of your heart and how well your heart's chambers and valves are working. This procedure takes approximately one hour. There are no restrictions for this procedure.  Your physician has recommended you make the following change in your medication:   START ATENOLOL 25 MG DAILY  START PRAVASTATIN 20 MG DAILY. DO NOT START FOR 2 WEEKS (UNTIL 07/02/14).   WE HAVE SENT PRAVASTATIN TO [facility] AND ATENOLOL TO [facility] IN [place]  CONTINUE ALL OTHER MEDICATIONS AS DIRECTED  Thank you for choosing [facility]!!

## 2014-06-19 NOTE — Progress Notes (Signed)
Clinical Summary Angela Dawson is a 69 y.o.female seen today for follow up of the following medical problems.  1. Heart murmur - 09/2013 echo showed LVEF 65-70%, grade I diastolic dysfunction. Bicuspid AV with moderate to severe AS (mean grad 21, dimensionless index 0.32, valve area 0.93), mild AI. Described mild aorta dilatation from limited views, ascending aorta 4.6 cm. - CTA chest 05/2014 with asscending thoracic aortic aneurysm 4.9 cm.    - since last visit denies any chest pain, no SOB/DOE, no presyncope or syncope.   2. HL - side effects on crestor and atorvastatin previously. Has not been on statin.   Past Medical History  Diagnosis Date  . Hypertension      Allergies  Allergen Reactions  . Crestor [Rosuvastatin]     Dizziness  . Lipitor [Atorvastatin]     Unable to stand or move  . Penicillins Rash     Current Outpatient Prescriptions  Medication Sig Dispense Refill  . albuterol (PROVENTIL HFA;VENTOLIN HFA) 108 (90 BASE) MCG/ACT inhaler Inhale 2 puffs into the lungs every 6 (six) hours as needed for wheezing or shortness of breath. 1 Inhaler 2  . aspirin 81 MG tablet Take 81 mg by mouth daily.    . benazepril-hydrochlorthiazide (LOTENSIN HCT) 20-12.5 MG per tablet Take 1 tablet by mouth daily. 90 tablet 1  . cetirizine (ZYRTEC) 10 MG tablet Take 1 tablet (10 mg total) by mouth daily. 30 tablet 11  . fluticasone (FLONASE) 50 MCG/ACT nasal spray Place 2 sprays into both nostrils daily. 16 g 6   No current facility-administered medications for this visit.     Past Surgical History  Procedure Laterality Date  . Tubal ligation       Allergies  Allergen Reactions  . Crestor [Rosuvastatin]     Dizziness  . Lipitor [Atorvastatin]     Unable to stand or move  . Penicillins Rash      Family History  Problem Relation Age of Onset  . Alzheimer's disease Mother   . Heart attack Father      Social History Angela Dawson reports that she quit smoking about  40 years ago. She does not have any smokeless tobacco history on file. Angela Dawson reports that she does not drink alcohol.   Review of Systems CONSTITUTIONAL: No weight loss, fever, chills, weakness or fatigue.  HEENT: Eyes: No visual loss, blurred vision, double vision or yellow sclerae.No hearing loss, sneezing, congestion, runny nose or sore throat.  SKIN: No rash or itching.  CARDIOVASCULAR: per HPI RESPIRATORY: No shortness of breath, cough or sputum.  GASTROINTESTINAL: No anorexia, nausea, vomiting or diarrhea. No abdominal pain or blood.  GENITOURINARY: No burning on urination, no polyuria NEUROLOGICAL: No headache, dizziness, syncope, paralysis, ataxia, numbness or tingling in the extremities. No change in bowel or bladder control.  MUSCULOSKELETAL: No muscle, back pain, joint pain or stiffness.  LYMPHATICS: No enlarged nodes. No history of splenectomy.  PSYCHIATRIC: No history of depression or anxiety.  ENDOCRINOLOGIC: No reports of sweating, cold or heat intolerance. No polyuria or polydipsia.  Marland Kitchen   Physical Examination p 68 bp 138/86 Wt 211 lbs BMI 41 Gen: resting comfortably, no acute distress HEENT: no scleral icterus, pupils equal round and reactive, no palptable cervical adenopathy,  CV: RRR, 3/6 systolic murmur RUSB, no JVD. Resp: Clear to auscultation bilaterally GI: abdomen is soft, non-tender, non-distended, normal bowel sounds, no hepatosplenomegaly MSK: extremities are warm, no edema.  Skin: warm, no rash Neuro:  no focal  deficits Psych: appropriate affect   Diagnostic Studies 09/2013 Echo Study Conclusions  - Left ventricle: The cavity size was normal. Wall thickness was increased in a pattern of mild LVH. Systolic function was vigorous. The estimated ejection fraction was in the range of 65% to 70%. Wall motion was normal; there were no regional wall motion abnormalities. Doppler parameters are consistent with abnormal left ventricular relaxation (grade  1 diastolic dysfunction). - Aortic valve: Bicuspid; moderately calcified leaflets. Cusp separation was reduced. There was moderate to severe stenosis. There was mild regurgitation. Mean gradient (S): 21 mm Hg. LVOT/AV VTI ratio 0.32. Peak gradient (S): 40 mm Hg. Valve area (VTI): 0.93 cm^2. Valve area (Vmax): 0.96 cm^2. - Ascending aorta: The ascending aorta was moderately dilated. Approximately 46 mm. - Mitral valve: Calcified annulus. There was trivial regurgitation. - Right atrium: Central venous pressure (est): 3 mm Hg. - Atrial septum: No defect or patent foramen ovale was identified. - Tricuspid valve: There was trivial regurgitation. - Pulmonary arteries: PA peak pressure: 29 mm Hg (S). - Pericardium, extracardiac: There was no pericardial effusion.  Impressions:  - Mild LVH with LVEF 65-70%, grade 1 diastolic dysfunction. Moderate to severe aortic stenosis as outlined with bicuspid aortic valve and mild aortic regurgitation. There is also at least moderate dilatation of the ascending aorta based on limited views. Given association of aortic aneurysmal disease with bicuspid aortic valve, would suggest further dedicated imaging of the thoracic aorta with CTA or MRA. Normal PASP 29 mmHg.   05/2014 CTA chest IMPRESSION: Maximal diameter of the ascending aorta is 4.9 cm compare with 4.8 cm on the prior study. No evidence of dissection.  There are airspace and ground-glass opacities in the left upper lobe most consistent with an inflammatory process. There is also a 2 mm nodule. If the patient is at high risk for bronchogenic carcinoma, follow-up chest CT at 1 year is recommended. If the patient is at low risk, no follow-up is needed. This recommendation follows the consensus statement: Guidelines for Management of Small Pulmonary Nodules Detected on CT Scans: A Statement from the Fleischner Society as published in Radiology 2005; 237:395-400.   ADDENDUM: The patient  therefore has a thoracic aortic aneurysm that has only increased from 4.8 cm to 4.9 cm since August of last year. Ascending thoracic aortic aneurysm. Recommend semi-annual imaging followup by CTA or MRA and referral to cardiothoracic surgery if not already obtained. This recommendation follows 2010 ACCF/AHA/AATS/ACR/ASA/SCA/SCAI/SIR/STS/SVM Guidelines for the Diagnosis and Management of Patients With Thoracic Aortic Disease. Circulation. 2010; 121: [id number]    Assessment and Plan   1. Bicuspid aortic valve - severe by reported area, moderate by gradient and dimensionless index. Ascending aortic aneurysm by CT - continue to follow with serial imagins, will repeat echo - start atenolol 25mg  daily   2. HL - side effects on crestor and lipitor, will try pravastatin 20mg  daily.    F/u 6 months   Antoine PocheJonathan F. Doy Taaffe, [name],

## 2014-07-01 ENCOUNTER — Other Ambulatory Visit: Payer: Self-pay

## 2014-07-01 ENCOUNTER — Ambulatory Visit (INDEPENDENT_AMBULATORY_CARE_PROVIDER_SITE_OTHER): Payer: Medicare Other

## 2014-07-01 DIAGNOSIS — Q231 Congenital insufficiency of aortic valve: Principal | ICD-10-CM

## 2014-07-01 DIAGNOSIS — I350 Nonrheumatic aortic (valve) stenosis: Secondary | ICD-10-CM | POA: Diagnosis not present

## 2014-07-01 DIAGNOSIS — I712 Thoracic aortic aneurysm, without rupture: Secondary | ICD-10-CM

## 2014-07-01 DIAGNOSIS — Q230 Congenital stenosis of aortic valve: Secondary | ICD-10-CM

## 2014-07-02 ENCOUNTER — Telehealth: Payer: Self-pay | Admitting: *Deleted

## 2014-07-02 NOTE — Telephone Encounter (Signed)
-----   Message from Antoine PocheJonathan F Branch, MD sent at 07/02/2014  9:26 AM EDT ----- Echo shows her heart valve is in the moderate to severe range of aortic stenosis. We will continue to monitor at this time  Dominga FerryJ Branch MD

## 2014-07-02 NOTE — Telephone Encounter (Signed)
Pt made aware, forwarded to Dr. Christell ConstantMoore

## 2014-07-10 ENCOUNTER — Encounter: Payer: Self-pay | Admitting: Family Medicine

## 2014-07-29 ENCOUNTER — Ambulatory Visit (INDEPENDENT_AMBULATORY_CARE_PROVIDER_SITE_OTHER): Payer: Medicare Other | Admitting: Nurse Practitioner

## 2014-07-29 ENCOUNTER — Encounter: Payer: Self-pay | Admitting: Nurse Practitioner

## 2014-07-29 VITALS — BP 113/70 | HR 56 | Temp 97.7°F | Ht 60.0 in | Wt 212.2 lb

## 2014-07-29 DIAGNOSIS — I10 Essential (primary) hypertension: Secondary | ICD-10-CM

## 2014-07-29 DIAGNOSIS — E785 Hyperlipidemia, unspecified: Secondary | ICD-10-CM

## 2014-07-29 NOTE — Progress Notes (Signed)
  Subjective:    Patient ID: Angela Dawson, female    DOB: 08-04-1945, 69 y.o.   MRN: [id number]  Patient is here today for chronic disease follow up.   Hypertension This is a chronic problem. The current episode started more than 1 year ago. The problem is unchanged. The problem is controlled. Pertinent negatives include no chest pain, headaches or shortness of breath. Risk factors for coronary artery disease include dyslipidemia and post-menopausal state. Past treatments include ACE inhibitors and diuretics. The current treatment provides moderate improvement. Compliance problems include diet and exercise.   Hyperlipidemia This is a chronic problem. The current episode started more than 1 year ago. The problem is controlled. Exacerbating diseases include obesity. Pertinent negatives include no chest pain or shortness of breath. The current treatment provides moderate improvement of lipids. There are no compliance problems.  Risk factors for coronary artery disease include dyslipidemia, family history, hypertension, obesity and post-menopausal.   * patient stop taking lipitor due to side effect.    Review of Systems  Constitutional: Negative.   HENT: Negative.   Respiratory: Negative for shortness of breath.   Cardiovascular: Negative for chest pain.  Genitourinary: Negative.   Neurological: Negative for headaches.  Psychiatric/Behavioral: Negative.   All other systems reviewed and are negative.      Objective:   Physical Exam  Constitutional: She is oriented to person, place, and time. She appears well-developed and well-nourished.  HENT:  Nose: Nose normal.  Mouth/Throat: Oropharynx is clear and moist.  Eyes: EOM are normal.  Neck: Trachea normal, normal range of motion and full passive range of motion without pain. Neck supple. No JVD present. Carotid bruit is not present. No thyromegaly present.  Cardiovascular: Normal rate, regular rhythm and intact distal pulses.  Exam reveals  no gallop and no friction rub.   Murmur (1/6 systolic murmur) heard. Pulmonary/Chest: Effort normal and breath sounds normal.  Abdominal: Soft. Bowel sounds are normal. She exhibits no distension and no mass. There is no tenderness.  Musculoskeletal: Normal range of motion.  Lymphadenopathy:    She has no cervical adenopathy.  Neurological: She is alert and oriented to person, place, and time. She has normal reflexes.  Skin: Skin is warm and dry.  Psychiatric: She has a normal mood and affect. Her behavior is normal. Judgment and thought content normal.   BP 113/70 mmHg  Pulse 56  Temp(Src) 97.7 F (36.5 C) (Oral)  Ht 5' (1.524 m)  Wt 212 lb 3.2 oz (96.253 kg)  BMI 41.44 kg/m2         Assessment & Plan:   1. Essential hypertension Do not add salt to diet - benazepril-hydrochlorthiazide (LOTENSIN HCT) 20-12.5 MG per tablet; Take 1 tablet by mouth daily.  Dispense: 90 tablet; Refill: 1  2. Severe obesity (BMI >= 40) Discussed diet and exercise for person with BMI >25 Will recheck weight in 3-6 months   3. Hyperlipidemia Low fat diet -pravachol $RemoveBeforeD'40mg'[id number]$  daily  4. Aortic stenosis due to bicuspid aortic valve Keep follow up appointment with cardiologist  Orders Placed This Encounter  Procedures  . CMP14+EGFR  . NMR, lipoprofile      Labs pending Health maintenance reviewed Diet and exercise encouraged Continue all meds Follow up  In 6 month   [place], FNP

## 2014-07-29 NOTE — Patient Instructions (Signed)
Fat and Cholesterol Control Diet Fat and cholesterol levels in your blood and organs are influenced by your diet. High levels of fat and cholesterol may lead to diseases of the heart, small and large blood vessels, gallbladder, liver, and pancreas. CONTROLLING FAT AND CHOLESTEROL WITH DIET Although exercise and lifestyle factors are important, your diet is key. That is because certain foods are known to raise cholesterol and others to lower it. The goal is to balance foods for their effect on cholesterol and more importantly, to replace saturated and trans fat with other types of fat, such as monounsaturated fat, polyunsaturated fat, and omega-3 fatty acids. On average, a person should consume no more than 15 to 17 g of saturated fat daily. Saturated and trans fats are considered "bad" fats, and they will raise LDL cholesterol. Saturated fats are primarily found in animal products such as meats, butter, and cream. However, that does not mean you need to give up all your favorite foods. Today, there are good tasting, low-fat, low-cholesterol substitutes for most of the things you like to eat. Choose low-fat or nonfat alternatives. Choose round or loin cuts of red meat. These types of cuts are lowest in fat and cholesterol. Chicken (without the skin), fish, veal, and ground turkey breast are great choices. Eliminate fatty meats, such as hot dogs and salami. Even shellfish have little or no saturated fat. Have a 3 oz (85 g) portion when you eat lean meat, poultry, or fish. Trans fats are also called "partially hydrogenated oils." They are oils that have been scientifically manipulated so that they are solid at room temperature resulting in a longer shelf life and improved taste and texture of foods in which they are added. Trans fats are found in stick margarine, some tub margarines, cookies, crackers, and baked goods.  When baking and cooking, oils are a great substitute for butter. The monounsaturated oils are  especially beneficial since it is believed they lower LDL and raise HDL. The oils you should avoid entirely are saturated tropical oils, such as coconut and palm.  Remember to eat a lot from food groups that are naturally free of saturated and trans fat, including fish, fruit, vegetables, beans, grains (barley, rice, couscous, bulgur wheat), and pasta (without cream sauces).  IDENTIFYING FOODS THAT LOWER FAT AND CHOLESTEROL  Soluble fiber may lower your cholesterol. This type of fiber is found in fruits such as apples, vegetables such as broccoli, potatoes, and carrots, legumes such as beans, peas, and lentils, and grains such as barley. Foods fortified with plant sterols (phytosterol) may also lower cholesterol. You should eat at least 2 g per day of these foods for a cholesterol lowering effect.  Read package labels to identify low-saturated fats, trans fat free, and low-fat foods at the supermarket. Select cheeses that have only 2 to 3 g saturated fat per ounce. Use a heart-healthy tub margarine that is free of trans fats or partially hydrogenated oil. When buying baked goods (cookies, crackers), avoid partially hydrogenated oils. Breads and muffins should be made from whole grains (whole-wheat or whole oat flour, instead of "flour" or "enriched flour"). Buy non-creamy canned soups with reduced salt and no added fats.  FOOD PREPARATION TECHNIQUES  Never deep-fry. If you must fry, either stir-fry, which uses very little fat, or use non-stick cooking sprays. When possible, broil, bake, or roast meats, and steam vegetables. Instead of putting butter or margarine on vegetables, use lemon and herbs, applesauce, and cinnamon (for squash and sweet potatoes). Use nonfat   yogurt, salsa, and low-fat dressings for salads.  LOW-SATURATED FAT / LOW-FAT FOOD SUBSTITUTES Meats / Saturated Fat (g)  Avoid: Steak, marbled (3 oz/85 g) / 11 g  Choose: Steak, lean (3 oz/85 g) / 4 g  Avoid: Hamburger (3 oz/85 g) / 7  g  Choose: Hamburger, lean (3 oz/85 g) / 5 g  Avoid: Ham (3 oz/85 g) / 6 g  Choose: Ham, lean cut (3 oz/85 g) / 2.4 g  Avoid: Chicken, with skin, dark meat (3 oz/85 g) / 4 g  Choose: Chicken, skin removed, dark meat (3 oz/85 g) / 2 g  Avoid: Chicken, with skin, light meat (3 oz/85 g) / 2.5 g  Choose: Chicken, skin removed, light meat (3 oz/85 g) / 1 g Dairy / Saturated Fat (g)  Avoid: Whole milk (1 cup) / 5 g  Choose: Low-fat milk, 2% (1 cup) / 3 g  Choose: Low-fat milk, 1% (1 cup) / 1.5 g  Choose: Skim milk (1 cup) / 0.3 g  Avoid: Hard cheese (1 oz/28 g) / 6 g  Choose: Skim milk cheese (1 oz/28 g) / 2 to 3 g  Avoid: Cottage cheese, 4% fat (1 cup) / 6.5 g  Choose: Low-fat cottage cheese, 1% fat (1 cup) / 1.5 g  Avoid: Ice cream (1 cup) / 9 g  Choose: Sherbet (1 cup) / 2.5 g  Choose: Nonfat frozen yogurt (1 cup) / 0.3 g  Choose: Frozen fruit bar / trace  Avoid: Whipped cream (1 tbs) / 3.5 g  Choose: Nondairy whipped topping (1 tbs) / 1 g Condiments / Saturated Fat (g)  Avoid: Mayonnaise (1 tbs) / 2 g  Choose: Low-fat mayonnaise (1 tbs) / 1 g  Avoid: Butter (1 tbs) / 7 g  Choose: Extra light margarine (1 tbs) / 1 g  Avoid: Coconut oil (1 tbs) / 11.8 g  Choose: Olive oil (1 tbs) / 1.8 g  Choose: Corn oil (1 tbs) / 1.7 g  Choose: Safflower oil (1 tbs) / 1.2 g  Choose: Sunflower oil (1 tbs) / 1.4 g  Choose: Soybean oil (1 tbs) / 2.4 g  Choose: Canola oil (1 tbs) / 1 g Document Released: 02/20/2005 Document Revised: 06/17/2012 Document Reviewed: 05/21/2013 Exit[employer] Patient Information 2015 [employer]. This information is not intended to replace advice given to you by your health care provider. Make sure you discuss any questions you have with your health care provider.  

## 2014-07-30 LAB — NMR, LIPOPROFILE
Cholesterol: 197 mg/dL (ref 100–199)
HDL CHOLESTEROL BY NMR: 52 mg/dL (ref >39)
HDL Particle Number: 31.7 umol/L (ref >=30.5)
LDL PARTICLE NUMBER: 1543 nmol/L — AB (ref <1000)
LDL SIZE: 21 nm (ref >20.5)
LDL-C: 121 mg/dL — ABNORMAL HIGH (ref 0–99)
LP-IR Score: 59 — ABNORMAL HIGH (ref <=45)
SMALL LDL PARTICLE NUMBER: 635 nmol/L — AB (ref <=527)
Triglycerides by NMR: 119 mg/dL (ref 0–149)

## 2014-07-30 LAB — CMP14+EGFR
ALT: 14 IU/L (ref 0–32)
AST: 22 IU/L (ref 0–40)
Albumin/Globulin Ratio: 1.5 (ref 1.1–2.5)
Albumin: 4.1 g/dL (ref 3.6–4.8)
Alkaline Phosphatase: 63 IU/L (ref 39–117)
BUN/Creatinine Ratio: 16 (ref 11–26)
BUN: 13 mg/dL (ref 8–27)
Bilirubin Total: 0.5 mg/dL (ref 0.0–1.2)
CHLORIDE: 98 mmol/L (ref 97–108)
CO2: 26 mmol/L (ref 18–29)
CREATININE: 0.8 mg/dL (ref 0.57–1.00)
Calcium: 9.2 mg/dL (ref 8.7–10.3)
GFR calc Af Amer: 88 mL/min/{1.73_m2} (ref >59)
GFR, EST NON AFRICAN AMERICAN: 77 mL/min/{1.73_m2} (ref >59)
GLOBULIN, TOTAL: 2.8 g/dL (ref 1.5–4.5)
GLUCOSE: 93 mg/dL (ref 65–99)
Potassium: 4 mmol/L (ref 3.5–5.2)
Sodium: 141 mmol/L (ref 134–144)
Total Protein: 6.9 g/dL (ref 6.0–8.5)

## 2014-08-26 ENCOUNTER — Encounter: Payer: Self-pay | Admitting: *Deleted

## 2014-08-28 ENCOUNTER — Other Ambulatory Visit: Payer: Self-pay | Admitting: Nurse Practitioner

## 2014-09-01 ENCOUNTER — Other Ambulatory Visit: Payer: Self-pay | Admitting: Nurse Practitioner

## 2014-09-01 MED ORDER — BENAZEPRIL-HYDROCHLOROTHIAZIDE 20-12.5 MG PO TABS: 1.0000 | ORAL_TABLET | Freq: Every day | ORAL | Status: DC

## 2014-10-30 ENCOUNTER — Telehealth: Payer: Self-pay | Admitting: Cardiology

## 2014-10-30 NOTE — Telephone Encounter (Signed)
Patient advising office that she has stopped taking Pravastatin due to leg pain. / tg

## 2014-10-30 NOTE — Telephone Encounter (Signed)
Will forward to Dr. Branch. 

## 2014-10-30 NOTE — Telephone Encounter (Signed)
Ok thanks for update.   J Thedora Rings MD

## 2014-12-11 ENCOUNTER — Ambulatory Visit (INDEPENDENT_AMBULATORY_CARE_PROVIDER_SITE_OTHER): Payer: Medicare Other | Admitting: Cardiology

## 2014-12-11 ENCOUNTER — Encounter: Payer: Self-pay | Admitting: Cardiology

## 2014-12-11 VITALS — BP 142/102 | HR 59 | Ht 60.0 in | Wt 221.0 lb

## 2014-12-11 DIAGNOSIS — I10 Essential (primary) hypertension: Secondary | ICD-10-CM

## 2014-12-11 DIAGNOSIS — Q230 Congenital stenosis of aortic valve: Secondary | ICD-10-CM

## 2014-12-11 DIAGNOSIS — I7121 Aneurysm of the ascending aorta, without rupture: Secondary | ICD-10-CM

## 2014-12-11 DIAGNOSIS — I350 Nonrheumatic aortic (valve) stenosis: Secondary | ICD-10-CM

## 2014-12-11 DIAGNOSIS — I712 Thoracic aortic aneurysm, without rupture: Secondary | ICD-10-CM

## 2014-12-11 DIAGNOSIS — Q231 Congenital insufficiency of aortic valve: Secondary | ICD-10-CM | POA: Diagnosis not present

## 2014-12-11 MED ORDER — LISINOPRIL 20 MG PO TABS: 20.0000 mg | ORAL_TABLET | Freq: Every day | ORAL | Status: DC

## 2014-12-11 MED ORDER — HYDROCHLOROTHIAZIDE 25 MG PO TABS: 25.0000 mg | ORAL_TABLET | Freq: Every day | ORAL | Status: DC

## 2014-12-11 NOTE — Patient Instructions (Signed)
Your physician recommends that you schedule a follow-up appointment in: 3 months with Dr. Wyline Mood   Your physician has recommended you make the following change in your medication:   STOP LOTENSIN   START LISINOPRIL 20 MG DAILY  START HCTZ 25 MG DAILY  Your physician recommends that you return for lab work in: 2 WEEKS BMP  Thank you for choosing [facility]!!

## 2014-12-11 NOTE — Progress Notes (Signed)
Patient ID: Angela Dawson, female   DOB: May 21, 1945, 69 y.o.   MRN: [id number]     Clinical Summary Angela Dawson is a 69 y.o.female seen today for follow up of the following medical problems.  1. Aortic stenosis with bicuspid AV and ascending aortic aneurysm.  - 09/2013 echo showed LVEF 65-70%, grade I diastolic dysfunction. Bicuspid AV with moderate to severe AS (mean grad 21, dimensionless index 0.32, valve area 0.93), mild AI. Described mild aorta dilatation from limited views, ascending aorta 4.6 cm. - CTA chest 05/2014 with asscending thoracic aortic aneurysm 4.9 cm.  -06/2014 echo LVEF 65-70%, AVA area 0.91, mean grad 32   - denies any chest pain, no SOB/DOE, no syncope. Mild fatigue she attributes to atenolol - notes some mild DOE only with walking up inclines.   2. HL - not able to tolerate statins, most recently tried pravastatin.   - 07/2014 LDL 121 TG 119 HDL 32  3. 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 Hypertension      Allergies  Allergen Reactions  . Crestor [Rosuvastatin]     Dizziness  . Lipitor [Atorvastatin]     Unable to stand or move  . Penicillins Rash     Current Outpatient Prescriptions  Medication Sig Dispense Refill  . aspirin 81 MG tablet Take 81 mg by mouth daily.    Marland Kitchen atenolol (TENORMIN) 25 MG tablet Take 1 tablet (25 mg total) by mouth daily. 90 tablet 3  . benazepril-hydrochlorthiazide (LOTENSIN HCT) 20-12.5 MG per tablet Take 1 tablet by mouth daily. 90 tablet 1  . cetirizine (ZYRTEC) 10 MG tablet Take 1 tablet (10 mg total) by mouth daily. 30 tablet 11  . fluticasone (FLONASE) 50 MCG/ACT nasal spray Place 2 sprays into both nostrils daily. 16 g 6   No current facility-administered medications for this visit.     Past Surgical History  Procedure Laterality Date  . Tubal ligation       Allergies  Allergen  Reactions  . Crestor [Rosuvastatin]     Dizziness  . Lipitor [Atorvastatin]     Unable to stand or move  . Penicillins Rash      Family History  Problem Relation Age of Onset  . Alzheimer's disease Mother   . Heart attack Father      Social History [name] reports that she quit smoking about 41 years ago. She does not have any smokeless tobacco history on file. [name] reports that she does not drink alcohol.   Review of Systems CONSTITUTIONAL: No weight loss, fever, chills, weakness or fatigue.  HEENT: Eyes: No visual loss, blurred vision, double vision or yellow sclerae.No hearing loss, sneezing, congestion, runny nose or sore throat.  SKIN: No rash or itching.  CARDIOVASCULAR: per HPI RESPIRATORY: No cough or sputum.  GASTROINTESTINAL: No anorexia, nausea, vomiting or diarrhea. No abdominal pain or blood.  GENITOURINARY: No burning on urination, no polyuria NEUROLOGICAL: No headache, dizziness, syncope, paralysis, ataxia, numbness or tingling in the extremities. No change in bowel or bladder control.  MUSCULOSKELETAL: No muscle, back pain, joint pain or stiffness.  LYMPHATICS: No enlarged nodes. No history of splenectomy.  PSYCHIATRIC: No history of depression or anxiety.  ENDOCRINOLOGIC: No reports of sweating, cold or heat intolerance. No polyuria or polydipsia.  Marland Kitchen   Physical Examination Filed Vitals:   12/11/14 1528  BP: 142/102  Pulse: 59   Filed Vitals:   12/11/14 1528  Height: 5' (1.524 m)  Weight: 221 lb (100.245 kg)    Gen: resting comfortably, no acute distress HEENT: no scleral icterus, pupils equal round and reactive, no palptable cervical adenopathy,  CV: RRR, 3/6 systolic murmur RUSB, no jvd Resp: Clear to auscultation bilaterally GI: abdomen is soft, non-tender, non-distended, normal bowel sounds, no hepatosplenomegaly MSK: extremities are warm, no edema.  Skin: warm, no rash Neuro:  no focal deficits Psych: appropriate  affect   Diagnostic Studies 09/2013 Echo Study Conclusions  - Left ventricle: The cavity size was normal. Wall thickness was increased in a pattern of mild LVH. Systolic function was vigorous. The estimated ejection fraction was in the range of 65% to 70%. Wall motion was normal; there were no regional wall motion abnormalities. Doppler parameters are consistent with abnormal left ventricular relaxation (grade 1 diastolic dysfunction). - Aortic valve: Bicuspid; moderately calcified leaflets. Cusp separation was reduced. There was moderate to severe stenosis. There was mild regurgitation. Mean gradient (S): 21 mm Hg. LVOT/AV VTI ratio 0.32. Peak gradient (S): 40 mm Hg. Valve area (VTI): 0.93 cm^2. Valve area (Vmax): 0.96 cm^2. - Ascending aorta: The ascending aorta was moderately dilated. Approximately 46 mm. - Mitral valve: Calcified annulus. There was trivial regurgitation. - Right atrium: Central venous pressure (est): 3 mm Hg. - Atrial septum: No defect or patent foramen ovale was identified. - Tricuspid valve: There was trivial regurgitation. - Pulmonary arteries: PA peak pressure: 29 mm Hg (S). - Pericardium, extracardiac: There was no pericardial effusion.  Impressions:  - Mild LVH with LVEF 65-70%, grade 1 diastolic dysfunction. Moderate to severe aortic stenosis as outlined with bicuspid aortic valve and mild aortic regurgitation. There is also at least moderate dilatation of the ascending aorta based on limited views. Given association of aortic aneurysmal disease with bicuspid aortic valve, would suggest further dedicated imaging of the thoracic aorta with CTA or MRA. Normal PASP 29 mmHg.   05/2014 CTA chest IMPRESSION: Maximal diameter of the ascending aorta is 4.9 cm compare with 4.8 cm on the prior study. No evidence of dissection.  There are airspace and ground-glass opacities in the left upper lobe most consistent with an inflammatory process. There is also  a 2 mm nodule. If the patient is at high risk for bronchogenic carcinoma, follow-up chest CT at 1 year is recommended. If the patient is at low risk, no follow-up is needed. This recommendation follows the consensus statement: Guidelines for Management of Small Pulmonary Nodules Detected on CT Scans: A Statement from the Fleischner Society as published in Radiology 2005; 237:395-400.   ADDENDUM: The patient therefore has a thoracic aortic aneurysm that has only increased from 4.8 cm to 4.9 cm since August of last year. Ascending thoracic aortic aneurysm. Recommend semi-annual imaging followup by CTA or MRA and referral to cardiothoracic surgery if not already obtained. This recommendation follows 2010 ACCF/AHA/AATS/ACR/ASA/SCA/SCAI/SIR/STS/SVM Guidelines for the Diagnosis and Management of Patients With Thoracic Aortic Disease. Circulation. 2010; 121: [id number]     Assessment and Plan  1. Bicuspid aortic valve with aortic stenosis and aortic aneurysm - no significant symptoms - does not meet criteria at this time for intervention based on valve or aneurysm. She has f/u with CT surgery in the next few months.   2. HL - side effects on statins, continue diet control  3. HTN - ARB is expensive for her. Will change to lisinopril to  daily, check Bmet in  2 weeks.   F/u 3 months    Antoine Poche, [name]

## 2014-12-17 ENCOUNTER — Ambulatory Visit: Payer: Medicare Other | Admitting: Cardiology

## 2014-12-25 ENCOUNTER — Encounter: Payer: Self-pay | Admitting: *Deleted

## 2014-12-31 ENCOUNTER — Other Ambulatory Visit: Payer: Self-pay | Admitting: *Deleted

## 2014-12-31 DIAGNOSIS — I712 Thoracic aortic aneurysm, without rupture: Secondary | ICD-10-CM

## 2014-12-31 DIAGNOSIS — I7121 Aneurysm of the ascending aorta, without rupture: Secondary | ICD-10-CM

## 2015-01-18 DIAGNOSIS — I10 Essential (primary) hypertension: Secondary | ICD-10-CM | POA: Diagnosis not present

## 2015-01-21 ENCOUNTER — Telehealth: Payer: Self-pay | Admitting: *Deleted

## 2015-01-21 NOTE — Telephone Encounter (Signed)
-----   Message from Antoine PocheJonathan F Branch, MD sent at 01/21/2015  3:13 PM EST ----- Labs look good  Dominga FerryJ Branch MD

## 2015-01-21 NOTE — Telephone Encounter (Signed)
Pt aware, routed to pcp 

## 2015-01-26 ENCOUNTER — Encounter: Payer: Self-pay | Admitting: Cardiothoracic Surgery

## 2015-01-26 ENCOUNTER — Ambulatory Visit
Admission: RE | Admit: 2015-01-26 | Discharge: 2015-01-26 | Disposition: A | Discharge status: Home / Self Care | Payer: Medicare Other | Source: Ambulatory Visit | Attending: Cardiothoracic Surgery | Admitting: Cardiothoracic Surgery

## 2015-01-26 ENCOUNTER — Ambulatory Visit (INDEPENDENT_AMBULATORY_CARE_PROVIDER_SITE_OTHER): Payer: Medicare Other | Admitting: Cardiothoracic Surgery

## 2015-01-26 VITALS — BP 140/70 | HR 56 | Resp 20 | Ht 60.0 in | Wt 220.0 lb

## 2015-01-26 DIAGNOSIS — Q230 Congenital stenosis of aortic valve: Secondary | ICD-10-CM

## 2015-01-26 DIAGNOSIS — Q231 Congenital insufficiency of aortic valve: Secondary | ICD-10-CM | POA: Diagnosis not present

## 2015-01-26 DIAGNOSIS — I712 Thoracic aortic aneurysm, without rupture: Secondary | ICD-10-CM | POA: Diagnosis not present

## 2015-01-26 DIAGNOSIS — I350 Nonrheumatic aortic (valve) stenosis: Secondary | ICD-10-CM

## 2015-01-26 DIAGNOSIS — I7121 Aneurysm of the ascending aorta, without rupture: Secondary | ICD-10-CM

## 2015-01-26 MED ORDER — IOPAMIDOL (ISOVUE-370) INJECTION 76%
75.0000 mL | Freq: Once | INTRAVENOUS | Status: AC | PRN
  Administered 2015-01-26: 75 mL via INTRAVENOUS

## 2015-01-26 NOTE — Progress Notes (Signed)
[address]       [place] [id number]             [phone]                    Angela MayoLinda M Winebarger [facility] / [facility] Medical Record #[id number] Date of Birth: 08/01/1945  Referring: Antoine PocheBranch, Jonathan F, MD Primary Care: Rudi HeapMOORE, DONALD, MD  Chief Complaint:    Chief Complaint  Patient presents with  . Thoracic Aortic Aneurysm    8 month f/u with Chest CTA    History of Present Illness:    Angela Dawson 69 y.o. female is seen in the office 8 months ago for evaluation   moderately dilated ascending aorta. She has also noted to have moderate  aortic stenosis  of a bicuspid aortic valve. The patient is asymptomatic from a cardiac standpoint, she denies shortness of breath, PND, pedal edema, syncope, presyncope. She notes she works on a regular basis , and cares for her yard including mowing the grass with a push mower. She was noted on routine exam by nurse practitioner student to have a murmur. This led to evaluation by cardiology and a CT scan of the chest and echocardiogram.   Other than hypertension the patient has no previous history of cardiac disease  She does have a family history of cardiac disease her father died of myocardial infarction age 68   No family history of aortic dissection  Current Activity/ Functional Status:  Patient is independent with mobility/ambulation, transfers, ADL's, IADL's.   Zubrod Score: At the time of surgery this patient's most appropriate activity status/level should be described as: [x]     0    Normal activity, no symptoms []     1    Restricted in physical strenuous activity but ambulatory, able to do out light work []     2    Ambulatory and capable of self care, unable to do work activities, up and about               >50 % of waking hours                              []     3    Only limited self care, in bed greater than 50% of waking hours []     4    Completely disabled, no self care, confined to bed or chair []     5     Moribund   Past Medical History  Diagnosis Date  . Hypertension     Past Surgical History  Procedure Laterality Date  . Tubal ligation      Family History  Problem Relation Age of Onset  . Alzheimer's disease Mother   . Heart attack Father       History  Smoking status  . Former Smoker  . Quit date: 10/10/1973  Smokeless tobacco  . Never Used    Comment: smoked less than 1 pack every 2 weeks. Havent smoked in atleast 40 years    History  Alcohol Use No     Allergies  Allergen Reactions  . Crestor [Rosuvastatin]     Dizziness  . Lipitor [Atorvastatin]     Unable to stand or move  . Statins     Leg cramps   . Penicillins Rash    Current Outpatient Prescriptions  Medication Sig Dispense Refill  . aspirin 81  MG tablet Take 81 mg by mouth daily.    Marland Kitchen atenolol (TENORMIN) 25 MG tablet Take 1 tablet (25 mg total) by mouth daily. 90 tablet 3  . cetirizine (ZYRTEC) 10 MG tablet Take 1 tablet (10 mg total) by mouth daily. 30 tablet 11  . fluticasone (FLONASE) 50 MCG/ACT nasal spray Place 2 sprays into both nostrils daily. 16 g 6  . hydrochlorothiazide (HYDRODIURIL) 25 MG tablet Take 1 tablet (25 mg total) by mouth daily. 90 tablet 3  . lisinopril (PRINIVIL,ZESTRIL) 20 MG tablet Take 1 tablet (20 mg total) by mouth daily. 90 tablet 3   No current facility-administered medications for this visit.     Review of Systems:     Cardiac Review of Systems: Y or N  Chest Pain [ n   ]  Resting SOB [n   ] Exertional SOB  [ n ]  Orthopnea [n  ]   Pedal Edema [ n  ]    Palpitations [n  ] Syncope  Milo.Brash  ]   Presyncope [ n  ]  General Review of Systems: [Y] = yes [  ]=no Constitional: recent weight change [  ];  Wt loss over the last 3 months [  n ] anorexia [  ]; fatigue [n  ]; nausea [  ]; night sweats [  ]; fever [  ]; or chills [  ];          Dental: poor dentition[  ]; Last Dentist visit:   Eye : blurred vision [ n ]; diplopia [  n ]; vision changes [ n ];  Amaurosis fugax[n   ]; Resp: cough [  ];  wheezing[n  ];  hemoptysis[n  ]; shortness of breath[ n ]; paroxysmal nocturnal dyspnea[  ]; dyspnea on exertion[  ]; or orthopnea[  ];  GI:  gallstones[  ], vomiting[  ];  dysphagia[  ]; melena[  ];  hematochezia [  ]; heartburn[  ];   Hx of  Colonoscopy[  ]; GU: kidney stones [  ]; hematuria[  ];   dysuria [  ];  nocturia[  ];  history of     obstruction [  ]; urinary frequency [  ]             Skin: rash, swelling[  ];, hair loss[  ];  peripheral edema[  ];  or itching[  ]; Musculosketetal: myalgias[  ];  joint swelling[  ];  joint erythema[  ];  joint pain[  ];  back pain[  ];  Heme/Lymph: bruising[  ];  bleeding[  ];  anemia[  ];  Neuro: TIA[n  ];  headaches[  ];  stroke[  ];  vertigo[  ];  seizures[  ];   paresthesias[ n ];  difficulty walking[n  ];  Psych:depression[ n ]; anxiety[  ];  Endocrine: diabetes[  ];  thyroid dysfunction[  ];  Immunizations: Flu up to date Cove.Etienne  ]; Pneumococcal up to date Cove.Etienne  ];  Other:  Physical Exam: BP 140/70 mmHg  Pulse 56  Resp 20  Ht 5' (1.524 m)  Wt 220 lb (99.791 kg)  BMI 42.97 kg/m2  SpO2 96%  PHYSICAL EXAMINATION:  General appearance: alert, cooperative, appears stated age and no distress Neurologic: intact Heart: systolic murmur: early systolic 3/6, crescendo at 2nd right intercostal space Lungs: clear to auscultation bilaterally Abdomen: soft, non-tender; bowel sounds normal; no masses,  no organomegaly Extremities: extremities normal, atraumatic, no cyanosis or edema and Homans sign  is negative, no sign of DVT Patient has no carotid bruits, has full and equal brachial radial and pedal pulses Has no cervical or supraclavicular adenopathy  Diagnostic Studies & Laboratory data:     Recent Radiology Findings:   Ct Angio Chest Aorta W/cm &/or Wo/cm  01/26/2015  CLINICAL DATA:  Followup thoracic ascending aorta aneurysm EXAM: CT ANGIOGRAPHY CHEST WITH CONTRAST TECHNIQUE: Multidetector CT imaging of the chest was  performed using the standard protocol during bolus administration of intravenous contrast. Multiplanar CT image reconstructions and MIPs were obtained to evaluate the vascular anatomy. CONTRAST:  75 mL Isovue 370 COMPARISON:  06/04/2014 FINDINGS: The lungs are well aerated bilaterally without focal infiltrate, effusion or sizable parenchymal nodule. The thoracic inlet shows no acute abnormality. The thoracic aorta again demonstrates dilatation of the ascending portion. At the level of the main pulmonary artery it measures approximately 4.6 x 4.6 cm. It tapers in the arch and the descending thoracic aorta is within normal limits. No dissection is seen. Heavy calcification of the aortic arch is noted The pulmonary artery as visualized shows no large central pulmonary emboli. No hilar or mediastinal adenopathy is seen. Mild coronary calcifications are seen. Scanning into the upper abdomen reveals no acute abnormality. The osseous structures show no acute abnormality. Degenerative changes of the thoracic spine are seen. Review of the MIP images confirms the above findings. IMPRESSION: Dilatation of the thoracic aorta in its ascending component measuring 4.6 x 4.6 cm in greatest dimension. This is likely stable from the prior exam as some motion artifact was seen on the prior study. No other focal abnormality is seen. Electronically Signed   By: Alcide Clever [name]   On: 01/26/2015 14:10   Ct Angio Chest Aorta W/cm &/or Wo/cm   06/04/2014   CLINICAL DATA:  Aortic aneurysm  EXAM: CT ANGIOGRAPHY CHEST WITH CONTRAST  TECHNIQUE: Multidetector CT imaging of the chest was performed using the standard protocol during bolus administration of intravenous contrast. Multiplanar CT image reconstructions and MIPs were obtained to evaluate the vascular anatomy.  CONTRAST:  75 cc Isovue 370  COMPARISON:  10/21/2013  FINDINGS: Maximal aortic diameters at the sinus of Valsalva, sino-tubular junction, and ascending aorta are 3.6 cm, 3.2  cm, and 4.9 cm respectively. Previous maximal diameter was 4.8 cm.  No evidence of aortic dissection or intramural hematoma. Moderate aortic valvular calcifications are noted.  Great vessels are patent within the confines of the study. Bilateral vertebral arteries are also patent.  No obvious evidence of acute pulmonary thromboembolism.  Minimal LAD territory coronary artery calcification. Trace left main coronary artery calcification.  No abnormal mediastinal adenopathy. Small mediastinal and bilateral axillary nodes.  Patchy ground-glass and airspace opacities in the lingula. There is a 2 mm nodule in the lingula on image 25.  No vertebral compression deformity.  Review of the MIP images confirms the above findings.  IMPRESSION: Maximal diameter of the ascending aorta is 4.9 cm compare with 4.8 cm on the prior study. No evidence of dissection.  There are airspace and ground-glass opacities in the left upper lobe most consistent with an inflammatory process. There is also a 2 mm nodule. If the patient is at high risk for bronchogenic carcinoma, follow-up chest CT at 1 year is recommended. If the patient is at low risk, no follow-up is needed. This recommendation follows the consensus statement: Guidelines for Management of Small Pulmonary Nodules Detected on CT Scans: A Statement from the Fleischner Society as published in Radiology 2005; 237:395-400.  Electronically Signed   By: Jolaine Click [name]   On: 06/04/2014 14:57    EXAM:  CT ANGIOGRAPHY CHEST WITH CONTRAST  TECHNIQUE:  Multidetector CT imaging of the chest was performed using the  standard protocol during bolus administration of intravenous  contrast. Multiplanar CT image reconstructions and MIPs were  obtained to evaluate the vascular anatomy.  CONTRAST: OMNIPAQUE IOHEXOL 350 MG/ML SOLN IV  COMPARISON: None  FINDINGS:  Fusiform aneurysmal dilatation ascending thoracic aorta 4.8 x 4.6 cm  image 35.  Aortic arch and descending thoracic  aorta normal caliber.  Mild scattered atherosclerotic plaque formation thoracic aorta.  No evidence of aortic dissection or saccular aneurysm.  Plaque identified at origin of the celiac artery.  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  Pulmonary arteries well opacified and patent.  Visualized upper abdomen otherwise unremarkable.  No thoracic adenopathy.  Lungs clear.  No pleural effusion or pneumothorax.  Osseous structures unremarkable.  Review of the MIP images confirms the above findings.  IMPRESSION:  Fusiform aneurysmal dilatation ascending thoracic aorta measuring a  maximum of 4.8 x 4.6 cm.  Minimal scattered atherosclerotic calcifications.  Electronically Signed  By: Ulyses Southward [name]  On: 10/21/2013 09:50  No cardiac cath  Recent Lab Findings: Lab Results  Component Value Date   GLUCOSE 93 07/29/2014   CHOL 197 07/29/2014   TRIG 119 07/29/2014   HDL 52 07/29/2014   LDLCALC 156* 09/30/2013   ALT 14 07/29/2014   AST 22 07/29/2014   NA 141 07/29/2014   K 4.0 07/29/2014   CL 98 07/29/2014   CREATININE 0.80 07/29/2014   BUN 13 07/29/2014   CO2 26 07/29/2014    ECHO 09/2013 Study Conclusions  - Left ventricle: The cavity size was normal. Wall thickness was increased in a pattern of mild LVH. Systolic function was vigorous. The estimated ejection fraction was in the range of 65% to 70%. Wall motion was normal; there were no regional wall motion abnormalities. Doppler parameters are consistent with abnormal left ventricular relaxation (grade 1 diastolic dysfunction). - Aortic valve: Bicuspid; moderately calcified leaflets. Cusp separation was reduced. There was moderate to severe stenosis. There was mild regurgitation. Mean gradient (S): 21 mm Hg. LVOT/AV VTI ratio 0.32. Peak gradient (S): 40 mm Hg. Valve area (VTI): 0.93 cm^2. Valve area (Vmax): 0.96 cm^2. - Ascending aorta: The ascending aorta was moderately  dilated. Approximately 46 mm. - Mitral valve: Calcified annulus. There was trivial regurgitation. - Right atrium: Central venous pressure (est): 3 mm Hg. - Atrial septum: No defect or patent foramen ovale was identified. - Tricuspid valve: There was trivial regurgitation. - Pulmonary arteries: PA peak pressure: 29 mm Hg (S). - Pericardium, extracardiac: There was no pericardial effusion  Peak velocity aortic valve 315 cm/sec  Study Conclusions  - Left ventricle: The cavity size was normal. Systolic function was vigorous. The estimated ejection fraction was in the range of 65% to 70%. Wall motion was normal; there were no regional wall motion abnormalities. Features are consistent with a pseudonormal left ventricular filling pattern, with concomitant abnormal relaxation and increased filling pressure (grade 2 diastolic dysfunction). Doppler parameters are consistent with high ventricular filling pressure. Mild to moderate concentric left ventricular hypertrophy. - Aortic valve: Mildly to moderately calcified annulus. Moderately thickened, severely calcified leaflets. Cusp separation was reduced. There was moderate to severe stenosis. There was mild regurgitation. Peak velocity (S): 364 cm/s. Mean gradient: 32 mmHg. Valve area (Vmax): 0.91  cm^2. - Aorta: Ascending aortic diameter: 43 mm (S). Mild ascending aortic dilatation. - Mitral valve: Mildly to moderately calcified annulus. There was mild regurgitation.  Impressions:  - When compared to the report dated 10/02/13, the mean aortic valve gradient has increased by 11 mmHg. Moderate to severe stenosis is seen. Aortic Size Index=    4.8    /Body surface area is 2.06 meters squared. = 2.38  < 2.75 cm/m2      4% risk per year 2.75 to 4.25          8% risk per year > 4.25 cm/m2    20% risk per year   Assessment / Plan:   Patient with asymptomatic moderate  aortic stenosis, peak velocity across  the aortic valve estimated at 364  cm/s probable bicuspid aortic valve with the aorta dilated to 4.7cm. -.  The severity of her aortic stenosis currently is asymptomatic but the gradient has increased from previous echo, she needs to be followed closely as it may be the aortic stenosis determines the timing of surgical intervention.  I'll plan to see her back in 6 months after follow-up echo by Dr. Wyline Mood.   Delight Ovens MD      [address] [place] [place] [address] Office [phone]   Beeper [phone]  01/26/2015 3:14 PM

## 2015-02-12 ENCOUNTER — Ambulatory Visit: Payer: Medicare Other | Admitting: Nurse Practitioner

## 2015-02-15 ENCOUNTER — Ambulatory Visit (INDEPENDENT_AMBULATORY_CARE_PROVIDER_SITE_OTHER): Payer: Medicare Other | Admitting: Nurse Practitioner

## 2015-02-15 ENCOUNTER — Encounter: Payer: Self-pay | Admitting: Nurse Practitioner

## 2015-02-15 VITALS — BP 130/66 | HR 51 | Temp 97.0°F | Ht 60.0 in | Wt 216.0 lb

## 2015-02-15 DIAGNOSIS — Z1159 Encounter for screening for other viral diseases: Secondary | ICD-10-CM

## 2015-02-15 DIAGNOSIS — I10 Essential (primary) hypertension: Secondary | ICD-10-CM

## 2015-02-15 DIAGNOSIS — E785 Hyperlipidemia, unspecified: Secondary | ICD-10-CM | POA: Diagnosis not present

## 2015-02-15 DIAGNOSIS — I77810 Thoracic aortic ectasia: Secondary | ICD-10-CM | POA: Diagnosis not present

## 2015-02-15 NOTE — Patient Instructions (Signed)
Health Maintenance, Female Adopting a healthy lifestyle and getting preventive care can go a long way to promote health and wellness. Talk with your health care provider about what schedule of regular examinations is right for you. This is a good chance for you to check in with your provider about disease prevention and staying healthy. In between checkups, there are plenty of things you can do on your own. Experts have done a lot of research about which lifestyle changes and preventive measures are most likely to keep you healthy. Ask your health care provider for more information. WEIGHT AND DIET  Eat a healthy diet  Be sure to include plenty of vegetables, fruits, low-fat dairy products, and lean protein.  Do not eat a lot of foods high in solid fats, added sugars, or salt.  Get regular exercise. This is one of the most important things you can do for your health.  Most adults should exercise for at least 150 minutes each week. The exercise should increase your heart rate and make you sweat (moderate-intensity exercise).  Most adults should also do strengthening exercises at least twice a week. This is in addition to the moderate-intensity exercise.  Maintain a healthy weight  Body mass index (BMI) is a measurement that can be used to identify possible weight problems. It estimates body fat based on height and weight. Your health care provider can help determine your BMI and help you achieve or maintain a healthy weight.  For females 20 years of age and older:   A BMI below 18.5 is considered underweight.  A BMI of 18.5 to 24.9 is normal.  A BMI of 25 to 29.9 is considered overweight.  A BMI of 30 and above is considered obese.  Watch levels of cholesterol and blood lipids  You should start having your blood tested for lipids and cholesterol at 68 years of age, then have this test every 5 years.  You may need to have your cholesterol levels checked more often if:  Your lipid  or cholesterol levels are high.  You are older than 68 years of age.  You are at high risk for heart disease.  CANCER SCREENING   Lung Cancer  Lung cancer screening is recommended for adults 55-80 years old who are at high risk for lung cancer because of a history of smoking.  A yearly low-dose CT scan of the lungs is recommended for people who:  Currently smoke.  Have quit within the past 15 years.  Have at least a 30-pack-year history of smoking. A pack year is smoking an average of one pack of cigarettes a day for 1 year.  Yearly screening should continue until it has been 15 years since you quit.  Yearly screening should stop if you develop a health problem that would prevent you from having lung cancer treatment.  Breast Cancer  Practice breast self-awareness. This means understanding how your breasts normally appear and feel.  It also means doing regular breast self-exams. Let your health care provider know about any changes, no matter how small.  If you are in your 20s or 30s, you should have a clinical breast exam (CBE) by a health care provider every 1-3 years as part of a regular health exam.  If you are 40 or older, have a CBE every year. Also consider having a breast X-ray (mammogram) every year.  If you have a family history of breast cancer, talk to your health care provider about genetic screening.  If you   are at high risk for breast cancer, talk to your health care provider about having an MRI and a mammogram every year.  Breast cancer gene (BRCA) assessment is recommended for women who have family members with BRCA-related cancers. BRCA-related cancers include:  Breast.  Ovarian.  Tubal.  Peritoneal cancers.  Results of the assessment will determine the need for genetic counseling and BRCA1 and BRCA2 testing. Cervical Cancer Your health care provider may recommend that you be screened regularly for cancer of the pelvic organs (ovaries, uterus, and  vagina). This screening involves a pelvic examination, including checking for microscopic changes to the surface of your cervix (Pap test). You may be encouraged to have this screening done every 3 years, beginning at age 21.  For women ages 30-65, health care providers may recommend pelvic exams and Pap testing every 3 years, or they may recommend the Pap and pelvic exam, combined with testing for human papilloma virus (HPV), every 5 years. Some types of HPV increase your risk of cervical cancer. Testing for HPV may also be done on women of any age with unclear Pap test results.  Other health care providers may not recommend any screening for nonpregnant women who are considered low risk for pelvic cancer and who do not have symptoms. Ask your health care provider if a screening pelvic exam is right for you.  If you have had past treatment for cervical cancer or a condition that could lead to cancer, you need Pap tests and screening for cancer for at least 20 years after your treatment. If Pap tests have been discontinued, your risk factors (such as having a new sexual partner) need to be reassessed to determine if screening should resume. Some women have medical problems that increase the chance of getting cervical cancer. In these cases, your health care provider may recommend more frequent screening and Pap tests. Colorectal Cancer  This type of cancer can be detected and often prevented.  Routine colorectal cancer screening usually begins at 68 years of age and continues through 68 years of age.  Your health care provider may recommend screening at an earlier age if you have risk factors for colon cancer.  Your health care provider may also recommend using home test kits to check for hidden blood in the stool.  A small camera at the end of a tube can be used to examine your colon directly (sigmoidoscopy or colonoscopy). This is done to check for the earliest forms of colorectal  cancer.  Routine screening usually begins at age 50.  Direct examination of the colon should be repeated every 5-10 years through 68 years of age. However, you may need to be screened more often if early forms of precancerous polyps or small growths are found. Skin Cancer  Check your skin from head to toe regularly.  Tell your health care provider about any new moles or changes in moles, especially if there is a change in a mole's shape or color.  Also tell your health care provider if you have a mole that is larger than the size of a pencil eraser.  Always use sunscreen. Apply sunscreen liberally and repeatedly throughout the day.  Protect yourself by wearing long sleeves, pants, a wide-brimmed hat, and sunglasses whenever you are outside. HEART DISEASE, DIABETES, AND HIGH BLOOD PRESSURE   High blood pressure causes heart disease and increases the risk of stroke. High blood pressure is more likely to develop in:  People who have blood pressure in the high end   of the normal range (130-139/85-89 mm Hg).  People who are overweight or obese.  People who are African American.  If you are 38-23 years of age, have your blood pressure checked every 3-5 years. If you are 61 years of age or older, have your blood pressure checked every year. You should have your blood pressure measured twice--once when you are at a hospital or clinic, and once when you are not at a hospital or clinic. Record the average of the two measurements. To check your blood pressure when you are not at a hospital or clinic, you can use:  An automated blood pressure machine at a pharmacy.  A home blood pressure monitor.  If you are between 45 years and 39 years old, ask your health care provider if you should take aspirin to prevent strokes.  Have regular diabetes screenings. This involves taking a blood sample to check your fasting blood sugar level.  If you are at a normal weight and have a low risk for diabetes,  have this test once every three years after 68 years of age.  If you are overweight and have a high risk for diabetes, consider being tested at a younger age or more often. PREVENTING INFECTION  Hepatitis B  If you have a higher risk for hepatitis B, you should be screened for this virus. You are considered at high risk for hepatitis B if:  You were born in a country where hepatitis B is common. Ask your health care provider which countries are considered high risk.  Your parents were born in a high-risk country, and you have not been immunized against hepatitis B (hepatitis B vaccine).  You have HIV or AIDS.  You use needles to inject street drugs.  You live with someone who has hepatitis B.  You have had sex with someone who has hepatitis B.  You get hemodialysis treatment.  You take certain medicines for conditions, including cancer, organ transplantation, and autoimmune conditions. Hepatitis C  Blood testing is recommended for:  Everyone born from [birth year] through 1964.  Anyone with known risk factors for hepatitis C. Sexually transmitted infections (STIs)  You should be screened for sexually transmitted infections (STIs) including gonorrhea and chlamydia if:  You are sexually active and are younger than 68 years of age.  You are older than 68 years of age and your health care provider tells you that you are at risk for this type of infection.  Your sexual activity has changed since you were last screened and you are at an increased risk for chlamydia or gonorrhea. Ask your health care provider if you are at risk.  If you do not have HIV, but are at risk, it may be recommended that you take a prescription medicine daily to prevent HIV infection. This is called pre-exposure prophylaxis (PrEP). You are considered at risk if:  You are sexually active and do not regularly use condoms or know the HIV status of your partner(s).  You take drugs by injection.  You are sexually  active with a partner who has HIV. Talk with your health care provider about whether you are at high risk of being infected with HIV. If you choose to begin PrEP, you should first be tested for HIV. You should then be tested every 3 months for as long as you are taking PrEP.  PREGNANCY   If you are premenopausal and you may become pregnant, ask your health care provider about preconception counseling.  If you may  become pregnant, take 400 to 800 micrograms (mcg) of folic acid every day.  If you want to prevent pregnancy, talk to your health care provider about birth control (contraception). OSTEOPOROSIS AND MENOPAUSE   Osteoporosis is a disease in which the bones lose minerals and strength with aging. This can result in serious bone fractures. Your risk for osteoporosis can be identified using a bone density scan.  If you are 61 years of age or older, or if you are at risk for osteoporosis and fractures, ask your health care provider if you should be screened.  Ask your health care provider whether you should take a calcium or vitamin D supplement to lower your risk for osteoporosis.  Menopause may have certain physical symptoms and risks.  Hormone replacement therapy may reduce some of these symptoms and risks. Talk to your health care provider about whether hormone replacement therapy is right for you.  HOME CARE INSTRUCTIONS   Schedule regular health, dental, and eye exams.  Stay current with your immunizations.   Do not use any tobacco products including cigarettes, chewing tobacco, or electronic cigarettes.  If you are pregnant, do not drink alcohol.  If you are breastfeeding, limit how much and how often you drink alcohol.  Limit alcohol intake to no more than 1 drink per day for nonpregnant women. One drink equals 12 ounces of beer, 5 ounces of wine, or 1 ounces of hard liquor.  Do not use street drugs.  Do not share needles.  Ask your health care provider for help if  you need support or information about quitting drugs.  Tell your health care provider if you often feel depressed.  Tell your health care provider if you have ever been abused or do not feel safe at home.   This information is not intended to replace advice given to you by your health care provider. Make sure you discuss any questions you have with your health care provider.   Document Released: 09/05/2010 Document Revised: 03/13/2014 Document Reviewed: 01/22/2013 Elsevier Interactive Patient Education [employer].

## 2015-02-15 NOTE — Progress Notes (Signed)
  Subjective:    Patient ID: Angela Dawson, female    DOB: 1945-03-31, 69 y.o.   MRN: [id number]  Patient is here today for chronic disease follow up.   Hypertension This is a chronic problem. The current episode started more than 1 year ago. The problem is unchanged. The problem is controlled. Pertinent negatives include no chest pain, headaches or shortness of breath. Risk factors for coronary artery disease include dyslipidemia and post-menopausal state. Past treatments include ACE inhibitors and diuretics. The current treatment provides moderate improvement. Compliance problems include diet and exercise.   Hyperlipidemia This is a chronic problem. The current episode started more than 1 year ago. The problem is controlled. Exacerbating diseases include obesity. Pertinent negatives include no chest pain or shortness of breath. The current treatment provides moderate improvement of lipids. There are no compliance problems.  Risk factors for coronary artery disease include dyslipidemia, family history, hypertension, obesity and post-menopausal.  aortic dilitation Keep yearly follow up with cardiology.    * patient stop taking lipitor due to side effect.    Review of Systems  Constitutional: Negative.   HENT: Negative.   Respiratory: Negative for shortness of breath.   Cardiovascular: Negative for chest pain.  Genitourinary: Negative.   Neurological: Negative for headaches.  Psychiatric/Behavioral: Negative.   All other systems reviewed and are negative.      Objective:   Physical Exam  Constitutional: She is oriented to person, place, and time. She appears well-developed and well-nourished.  HENT:  Nose: Nose normal.  Mouth/Throat: Oropharynx is clear and moist.  Eyes: EOM are normal.  Neck: Trachea normal, normal range of motion and full passive range of motion without pain. Neck supple. No JVD present. Carotid bruit is not present. No thyromegaly present.  Cardiovascular:  Normal rate, regular rhythm and intact distal pulses.  Exam reveals no gallop and no friction rub.   Murmur (1/6 systolic murmur) heard. Pulmonary/Chest: Effort normal and breath sounds normal.  Abdominal: Soft. Bowel sounds are normal. She exhibits no distension and no mass. There is no tenderness.  Musculoskeletal: Normal range of motion.  Lymphadenopathy:    She has no cervical adenopathy.  Neurological: She is alert and oriented to person, place, and time. She has normal reflexes.  Skin: Skin is warm and dry.  Psychiatric: She has a normal mood and affect. Her behavior is normal. Judgment and thought content normal.   BP 130/66 mmHg  Pulse 51  Temp(Src) 97 F (36.1 C) (Oral)  Ht 5' (1.524 m)  Wt 216 lb (97.977 kg)  BMI 42.18 kg/m2         Assessment & Plan:  1. Essential hypertension Do not add salt to diet - CMP14+EGFR  2. Hyperlipidemia Low fat diet - Lipid panel  3. Morbid obesity, unspecified obesity type ([place]) Discussed diet and exercise for person with BMI >25 Will recheck weight in 3-6 months   4. Ascending aorta dilatation (HCC)  5. Need for hepatitis C screening test - Hepatitis C antibody    Labs pending Health maintenance reviewed Diet and exercise encouraged Continue all meds Follow up  In 6 months   [place], FNP

## 2015-02-16 LAB — CMP14+EGFR
A/G RATIO: 1.5 (ref 1.1–2.5)
ALK PHOS: 66 IU/L (ref 39–117)
ALT: 17 IU/L (ref 0–32)
AST: 20 IU/L (ref 0–40)
Albumin: 4 g/dL (ref 3.6–4.8)
BUN/Creatinine Ratio: 22 (ref 11–26)
BUN: 17 mg/dL (ref 8–27)
Bilirubin Total: 0.4 mg/dL (ref 0.0–1.2)
CHLORIDE: 99 mmol/L (ref 96–106)
CO2: 27 mmol/L (ref 18–29)
Calcium: 9.2 mg/dL (ref 8.7–10.3)
Creatinine, Ser: 0.76 mg/dL (ref 0.57–1.00)
GFR calc Af Amer: 93 mL/min/{1.73_m2} (ref >59)
GFR, EST NON AFRICAN AMERICAN: 81 mL/min/{1.73_m2} (ref >59)
Globulin, Total: 2.6 g/dL (ref 1.5–4.5)
Glucose: 92 mg/dL (ref 65–99)
Potassium: 4.2 mmol/L (ref 3.5–5.2)
SODIUM: 140 mmol/L (ref 134–144)
Total Protein: 6.6 g/dL (ref 6.0–8.5)

## 2015-02-16 LAB — LIPID PANEL
CHOLESTEROL TOTAL: 232 mg/dL — AB (ref 100–199)
Chol/HDL Ratio: 4.5 ratio units — ABNORMAL HIGH (ref 0.0–4.4)
HDL: 51 mg/dL (ref >39)
LDL Calculated: 157 mg/dL — ABNORMAL HIGH (ref 0–99)
TRIGLYCERIDES: 120 mg/dL (ref 0–149)
VLDL Cholesterol Cal: 24 mg/dL (ref 5–40)

## 2015-02-16 LAB — HEPATITIS C ANTIBODY: Hep C Virus Ab: <0.1 s/co ratio (ref 0.0–0.9)

## 2015-02-22 ENCOUNTER — Other Ambulatory Visit: Payer: Self-pay | Admitting: Nurse Practitioner

## 2015-02-22 DIAGNOSIS — J309 Allergic rhinitis, unspecified: Secondary | ICD-10-CM

## 2015-02-22 MED ORDER — CETIRIZINE HCL 10 MG PO TABS: 10.0000 mg | ORAL_TABLET | Freq: Every day | ORAL | Status: DC

## 2015-02-22 MED ORDER — ATENOLOL 25 MG PO TABS: 25.0000 mg | ORAL_TABLET | Freq: Every day | ORAL | Status: DC

## 2015-02-22 NOTE — Telephone Encounter (Signed)
done

## 2015-03-11 ENCOUNTER — Encounter: Payer: Self-pay | Admitting: Cardiology

## 2015-03-11 ENCOUNTER — Ambulatory Visit (INDEPENDENT_AMBULATORY_CARE_PROVIDER_SITE_OTHER): Payer: Medicare Other | Admitting: Cardiology

## 2015-03-11 VITALS — BP 110/60 | HR 49 | Ht 60.0 in | Wt 218.0 lb

## 2015-03-11 DIAGNOSIS — I712 Thoracic aortic aneurysm, without rupture: Secondary | ICD-10-CM | POA: Diagnosis not present

## 2015-03-11 DIAGNOSIS — I7121 Aneurysm of the ascending aorta, without rupture: Secondary | ICD-10-CM

## 2015-03-11 DIAGNOSIS — I350 Nonrheumatic aortic (valve) stenosis: Secondary | ICD-10-CM | POA: Diagnosis not present

## 2015-03-11 DIAGNOSIS — Q231 Congenital insufficiency of aortic valve: Secondary | ICD-10-CM

## 2015-03-11 MED ORDER — EZETIMIBE 10 MG PO TABS: 10.0000 mg | ORAL_TABLET | Freq: Every day | ORAL | Status: DC

## 2015-03-11 NOTE — Progress Notes (Signed)
Patient ID: Angela Dawson, female   DOB: 05/17/1945, 70 y.o.   MRN: [id number]     Clinical Summary Angela Dawson is a 70 y.o.female seen today for follow up of the following medical problems.   1. Aortic stenosis with bicuspid AV and ascending aortic aneurysm.  - 09/2013 echo showed LVEF 65-70%, grade I diastolic dysfunction. Bicuspid AV with moderate to severe AS (mean grad 21, dimensionless index 0.32, valve area 0.93), mild AI. Described mild aorta dilatation from limited views, ascending aorta 4.6 cm. - CTA chest 05/2014 with asscending thoracic aortic aneurysm 4.9 cm.  01/2015 CTA chest ascending aortic aneurysm 4.6 x 4.6 cm.  -06/2014 echo LVEF 65-70%, AVA area 0.91, mean grad 32  .  - denies chest pain. Note some SOB high levels of activity. No syncope.   2. HL - not able to tolerate statins, most recently tried pravastatin.  - 02/2015 TC 232 TG 120 HDL 51 LDL 157   3. 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 Hypertension      Allergies  Allergen Reactions  . Crestor [Rosuvastatin]     Dizziness  . Lipitor [Atorvastatin]     Unable to stand or move  . Statins     Leg cramps   . Penicillins Rash     Current Outpatient Prescriptions  Medication Sig Dispense Refill  . aspirin 81 MG tablet Take 81 mg by mouth daily.    Marland Kitchen. atenolol (TENORMIN) 25 MG tablet Take 1 tablet (25 mg total) by mouth daily. 90 tablet 1  . cetirizine (ZYRTEC) 10 MG tablet Take 1 tablet (10 mg total) by mouth daily. 90 tablet 3  . fluticasone (FLONASE) 50 MCG/ACT nasal spray Place 2 sprays into both nostrils daily. 16 g 6  . hydrochlorothiazide (HYDRODIURIL) 25 MG tablet Take 1 tablet (25 mg total) by mouth daily. 90 tablet 3  . lisinopril (PRINIVIL,ZESTRIL) 20 MG tablet Take 1 tablet (20 mg total) by mouth daily. 90 tablet 3   No current facility-administered medications for  this visit.     Past Surgical History  Procedure Laterality Date  . Tubal ligation       Allergies  Allergen Reactions  . Crestor [Rosuvastatin]     Dizziness  . Lipitor [Atorvastatin]     Unable to stand or move  . Statins     Leg cramps   . Penicillins Rash      Family History  Problem Relation Age of Onset  . Alzheimer's disease Mother   . Heart attack Father      Social History Angela Dawson reports that she quit smoking about 41 years ago. She has never used smokeless tobacco. Angela Dawson reports that she does not drink alcohol.   Review of Systems CONSTITUTIONAL: No weight loss, fever, chills, weakness or fatigue.  HEENT: Eyes: No visual loss, blurred vision, double vision or yellow sclerae.No hearing loss, sneezing, congestion, runny nose or sore throat.  SKIN: No rash or itching.  CARDIOVASCULAR: per hpi RESPIRATORY: No shortness of breath, cough or sputum.  GASTROINTESTINAL: No anorexia, nausea, vomiting or diarrhea. No abdominal pain or blood.  GENITOURINARY: No burning on urination, no polyuria NEUROLOGICAL: No headache, dizziness, syncope, paralysis, ataxia, numbness or tingling in the extremities. No change in bowel or bladder control.  MUSCULOSKELETAL: No muscle, back pain, joint pain or stiffness.  LYMPHATICS:  No enlarged nodes. No history of splenectomy.  PSYCHIATRIC: No history of depression or anxiety.  ENDOCRINOLOGIC: No reports of sweating, cold or heat intolerance. No polyuria or polydipsia.  Marland Kitchen   Physical Examination Filed Vitals:   03/11/15 1055  BP: 110/60  Pulse: 49   Filed Vitals:   03/11/15 1055  Height: 5' (1.524 m)  Weight: 218 lb (98.884 kg)    Gen: resting comfortably, no acute distress HEENT: no scleral icterus, pupils equal round and reactive, no palptable cervical adenopathy,  CV: RRR, 3/6 sysotlic murmur RUSB, no jvd Resp: Clear to auscultation bilaterally GI: abdomen is soft, non-tender, non-distended, normal bowel  sounds, no hepatosplenomegaly MSK: extremities are warm, no edema.  Skin: warm, no rash Neuro:  no focal deficits Psych: appropriate affect   Diagnostic Studies 09/2013 Echo Study Conclusions  - Left ventricle: The cavity size was normal. Wall thickness was increased in a pattern of mild LVH. Systolic function was vigorous. The estimated ejection fraction was in the range of 65% to 70%. Wall motion was normal; there were no regional wall motion abnormalities. Doppler parameters are consistent with abnormal left ventricular relaxation (grade 1 diastolic dysfunction). - Aortic valve: Bicuspid; moderately calcified leaflets. Cusp separation was reduced. There was moderate to severe stenosis. There was mild regurgitation. Mean gradient (S): 21 mm Hg. LVOT/AV VTI ratio 0.32. Peak gradient (S): 40 mm Hg. Valve area (VTI): 0.93 cm^2. Valve area (Vmax): 0.96 cm^2. - Ascending aorta: The ascending aorta was moderately dilated. Approximately 46 mm. - Mitral valve: Calcified annulus. There was trivial regurgitation. - Right atrium: Central venous pressure (est): 3 mm Hg. - Atrial septum: No defect or patent foramen ovale was identified. - Tricuspid valve: There was trivial regurgitation. - Pulmonary arteries: PA peak pressure: 29 mm Hg (S). - Pericardium, extracardiac: There was no pericardial effusion.  Impressions:  - Mild LVH with LVEF 65-70%, grade 1 diastolic dysfunction. Moderate to severe aortic stenosis as outlined with bicuspid aortic valve and mild aortic regurgitation. There is also at least moderate dilatation of the ascending aorta based on limited views. Given association of aortic aneurysmal disease with bicuspid aortic valve, would suggest further dedicated imaging of the thoracic aorta with CTA or MRA. Normal PASP 29 mmHg.   05/2014 CTA chest IMPRESSION: Maximal diameter of the ascending aorta is 4.9 cm compare with 4.8 cm on the prior study. No evidence of  dissection.  There are airspace and ground-glass opacities in the left upper lobe most consistent with an inflammatory process. There is also a 2 mm nodule. If the patient is at high risk for bronchogenic carcinoma, follow-up chest CT at 1 year is recommended. If the patient is at low risk, no follow-up is needed. This recommendation follows the consensus statement: Guidelines for Management of Small Pulmonary Nodules Detected on CT Scans: A Statement from the Fleischner Society as published in Radiology 2005; 237:395-400.   ADDENDUM: The patient therefore has a thoracic aortic aneurysm that has only increased from 4.8 cm to 4.9 cm since August of last year. Ascending thoracic aortic aneurysm. Recommend semi-annual imaging followup by CTA or MRA and referral to cardiothoracic surgery if not already obtained. This recommendation follows 2010 ACCF/AHA/AATS/ACR/ASA/SCA/SCAI/SIR/STS/SVM Guidelines for the Diagnosis and Management of Patients With Thoracic Aortic Disease. Circulation. 2010; 121: U045-W098   06/2014 Echo Study Conclusions  - Left ventricle: The cavity size was normal. Systolic function was vigorous. The estimated ejection fraction was in the range of 65% to 70%. Wall motion was normal; there were  no regional wall motion abnormalities. Features are consistent with a pseudonormal left ventricular filling pattern, with concomitant abnormal relaxation and increased filling pressure (grade 2 diastolic dysfunction). Doppler parameters are consistent with high ventricular filling pressure. Mild to moderate concentric left ventricular hypertrophy. - Aortic valve: Mildly to moderately calcified annulus. Moderately thickened, severely calcified leaflets. Cusp separation was reduced. There was moderate to severe stenosis. There was mild regurgitation. Peak velocity (S): 364 cm/s. Mean gradient: 32 mmHg. Valve area (Vmax): 0.91 cm^2. - Aorta: Ascending  aortic diameter: 43 mm (S). Mild ascending aortic dilatation. - Mitral valve: Mildly to moderately calcified annulus. There was mild regurgitation.  Impressions:  - When compared to the report dated 10/02/13, the mean aortic valve gradient has increased by 11 mmHg. Moderate to severe stenosis is seen.  01/2015 CTA chest IMPRESSION: Dilatation of the thoracic aorta in its ascending component measuring 4.6 x 4.6 cm in greatest dimension. This is likely stable from the prior exam as some motion artifact was seen on the prior study. Assessment and Plan  1. Bicuspid aortic valve with aortic stenosis and aortic aneurysm - will repeat echo, by last echo she had moderate to severe AS. She reports some SOB with high levels of activity, but otherwise no current symptoms. Ascending aortic aneurysm 4.6 x 4.6 cm by CTA 01/2015, continue to monitor   2. HL - side effects on statins - will try zetia 10 mg daily.   3. HTN - at goal, continue currnet meds    F/u 6 months  Antoine Poche, [name]

## 2015-03-11 NOTE — Patient Instructions (Addendum)
Your physician has requested that you have an echocardiogram. Echocardiography is a painless test that uses sound waves to create images of your heart. It provides your doctor with information about the size and shape of your heart and how well your heart's chambers and valves are working. This procedure takes approximately one hour. There are no restrictions for this procedure. Office will contact with results via phone or letter.   Begin Zetia 10mg  daily - [facility].  If tolerate, can contact office if want to send to mail order.  Continue all other medications.   Your physician wants you to follow up in: 6 months.  You will receive a reminder letter in the mail one-two months in advance.  If you don't receive a letter, please call our office to schedule the follow up appointment

## 2015-03-12 ENCOUNTER — Telehealth: Payer: Self-pay | Admitting: *Deleted

## 2015-03-12 NOTE — Telephone Encounter (Signed)
Patient left message on nurse voice mail - can not afford the Zetia, was told at pharmacy that her cost would be $287.00.  [facility] pharm stated that her insurance did not require a prior authorization, but she probably has a deductible to meet before she has co-pay at a lower amount.    Attempted to notify patient - left message.

## 2015-03-16 NOTE — Telephone Encounter (Signed)
Left message to return call 

## 2015-03-16 NOTE — Telephone Encounter (Signed)
Discussed with patient the cost of her Zetia.  Explained to her that her insurance did not require PA.  Pharm thought she may have a deductible to meet as well.  Patient will check with her insurance to clarify & call office back.

## 2015-03-17 ENCOUNTER — Other Ambulatory Visit: Payer: Medicare Other

## 2015-03-24 ENCOUNTER — Other Ambulatory Visit: Payer: Medicare Other

## 2015-03-24 ENCOUNTER — Ambulatory Visit (INDEPENDENT_AMBULATORY_CARE_PROVIDER_SITE_OTHER): Payer: Medicare Other

## 2015-03-24 ENCOUNTER — Other Ambulatory Visit: Payer: Self-pay

## 2015-03-24 DIAGNOSIS — I350 Nonrheumatic aortic (valve) stenosis: Secondary | ICD-10-CM | POA: Diagnosis not present

## 2015-03-25 ENCOUNTER — Telehealth: Payer: Self-pay | Admitting: Nurse Practitioner

## 2015-03-26 ENCOUNTER — Telehealth: Payer: Self-pay | Admitting: *Deleted

## 2015-03-26 NOTE — Telephone Encounter (Signed)
Pt aware, routed to pcp 

## 2015-03-26 NOTE — Telephone Encounter (Signed)
-----   Message from Antoine Poche, MD sent at 03/26/2015 11:41 AM EST ----- Echo shows valve fairly stable, mild increase in pressure but does not quite meet criteria for severe yet. We will continue to monitor  Dominga Ferry MD

## 2015-03-29 NOTE — Telephone Encounter (Signed)
No return call to present date.  

## 2015-07-01 IMAGING — CT CT ANGIO CHEST
2 of 6 series · 7 of 30 positions shown · IV contrast (75CC ISOVUE 370)
Comparison: 10/21/2013

ADDENDUM:
The patient therefore has a thoracic aortic aneurysm that has only
increased from 4.8 cm to 4.9 cm since [REDACTED] of last year. Ascending
thoracic aortic aneurysm. Recommend semi-annual imaging followup by
CTA or MRA and referral to cardiothoracic surgery if not already
obtained. This recommendation follows 6929
ACCF/AHA/AATS/ACR/ASA/SCA/FERMIN/LIV INGER/HEITMAN/MARTELO Guidelines for the
Diagnosis and Management of Patients With Thoracic Aortic Disease.
Circulation. 6929; 121: [id number]
CLINICAL DATA: Aortic aneurysm

EXAM:
CT ANGIOGRAPHY CHEST WITH CONTRAST
TECHNIQUE: Multidetector CT imaging of the chest was performed using the
standard protocol during bolus administration of intravenous
contrast. Multiplanar CT image reconstructions and MIPs were
obtained to evaluate the vascular anatomy.
CONTRAST:  75 cc Isovue 370

[Series 4: angio · axial · 0.70mm/px · z∈[-200,-60]mm · 3 of 112 slices shown]
[im 28/112  lung]
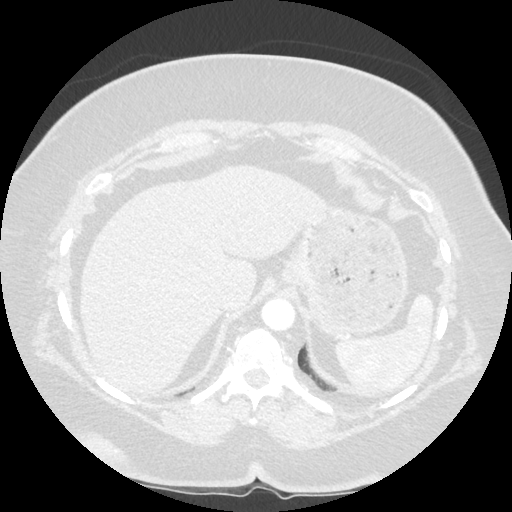
[im 56/112  mediastinal]
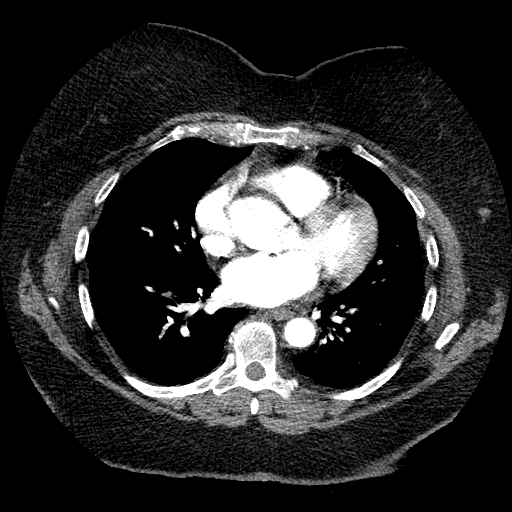
[im 84/112  lung]
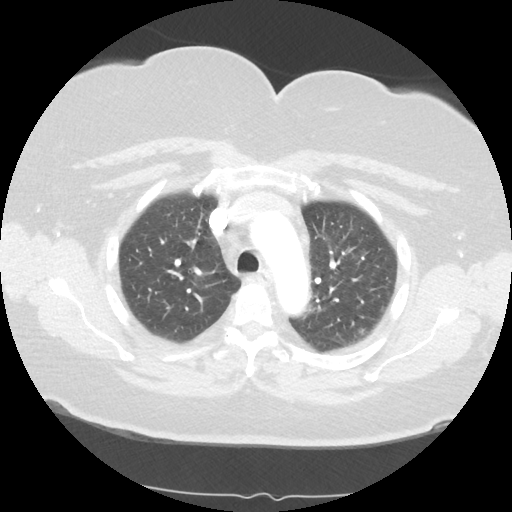

[Series 602: sagittal body · sagittal · 0.70mm/px · 4 of 145 slices shown]
[im 29/145  lung]
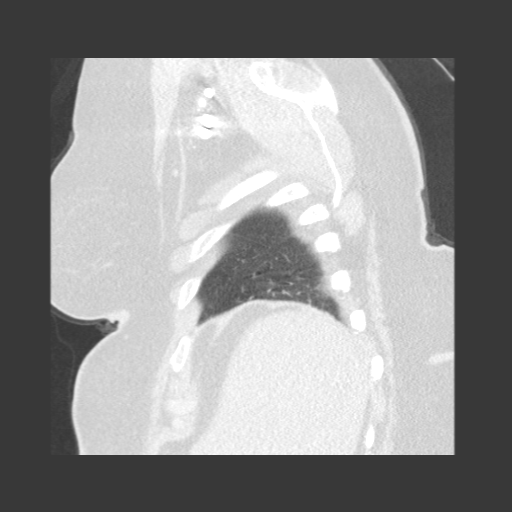
[im 58/145  lung]
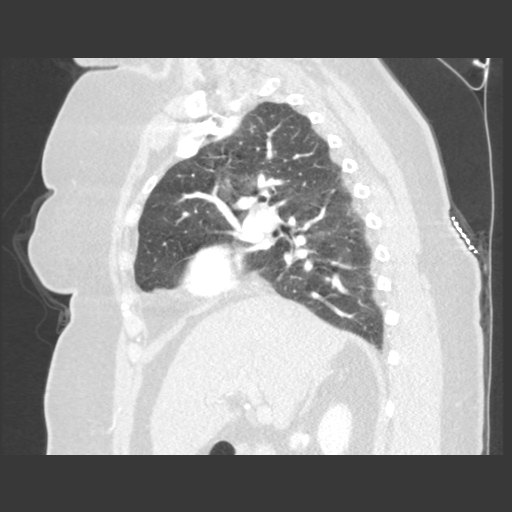
[im 87/145  lung]
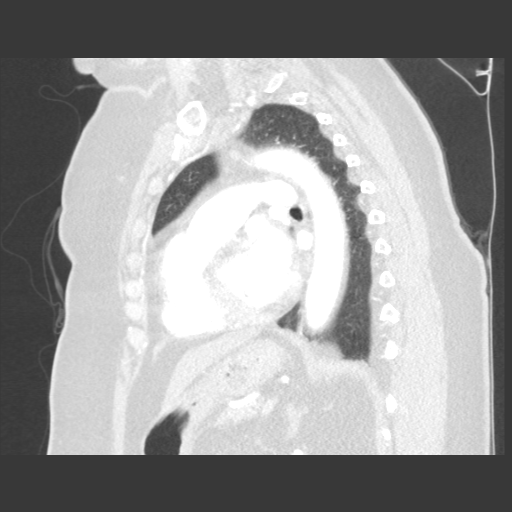
[im 116/145  lung]
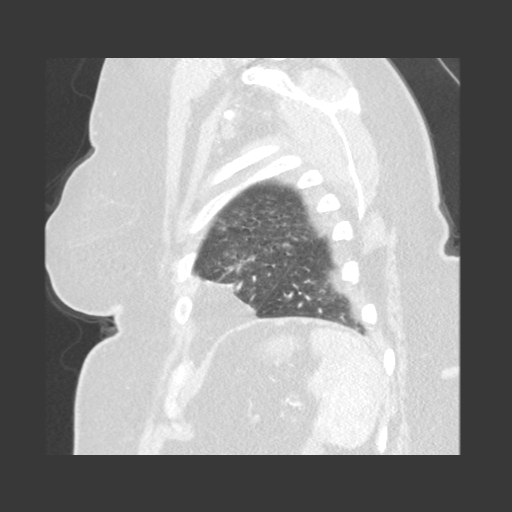

[7 of 30 positions shown; findings below may reference images not displayed]

FINDINGS: Maximal aortic diameters at the sinus of Valsalva, Yunika
junction, and ascending aorta are 3.6 cm, 3.2 cm, and 4.9 cm
respectively. Previous maximal diameter was 4.8 cm.

No evidence of aortic dissection or intramural hematoma. Moderate
aortic valvular calcifications are noted.

Great vessels are patent within the confines of the study. Bilateral
vertebral arteries are also patent.

No obvious evidence of acute pulmonary thromboembolism.

Minimal LAD territory coronary artery calcification. Trace left main
coronary artery calcification.

No abnormal mediastinal adenopathy. Small mediastinal and bilateral
axillary nodes.

Patchy ground-glass and airspace opacities in the lingula. There is
a 2 mm nodule in the lingula on image 25.

No vertebral compression deformity.

Review of the MIP images confirms the above findings.
IMPRESSION: Maximal diameter of the ascending aorta is 4.9 cm compare with
cm on the prior study. No evidence of dissection.

There are airspace and ground-glass opacities in the left upper lobe
most consistent with an inflammatory process. There is also a 2 mm
nodule. If the patient is at high risk for bronchogenic carcinoma,
follow-up chest CT at 1 year is recommended. If the patient is at
low risk, no follow-up is needed. This recommendation follows the
consensus statement: Guidelines for Management of Small Pulmonary
Nodules Detected on CT Scans: A Statement from the [place]

## 2015-07-12 ENCOUNTER — Other Ambulatory Visit: Payer: Self-pay | Admitting: Nurse Practitioner

## 2015-07-29 ENCOUNTER — Ambulatory Visit: Payer: Medicare Other | Admitting: Cardiothoracic Surgery

## 2015-08-23 ENCOUNTER — Telehealth: Payer: Self-pay | Admitting: Nurse Practitioner

## 2015-08-23 NOTE — Telephone Encounter (Signed)
scheduled

## 2015-08-25 ENCOUNTER — Other Ambulatory Visit: Payer: Self-pay | Admitting: Cardiology

## 2015-08-25 DIAGNOSIS — Q230 Congenital stenosis of aortic valve: Secondary | ICD-10-CM

## 2015-08-25 DIAGNOSIS — Q231 Congenital insufficiency of aortic valve: Principal | ICD-10-CM

## 2015-08-30 ENCOUNTER — Ambulatory Visit (INDEPENDENT_AMBULATORY_CARE_PROVIDER_SITE_OTHER): Payer: Medicare Other

## 2015-08-30 ENCOUNTER — Ambulatory Visit (INDEPENDENT_AMBULATORY_CARE_PROVIDER_SITE_OTHER): Payer: Medicare Other | Admitting: Nurse Practitioner

## 2015-08-30 ENCOUNTER — Encounter: Payer: Self-pay | Admitting: Nurse Practitioner

## 2015-08-30 VITALS — BP 128/64 | HR 51 | Temp 97.3°F | Ht 60.0 in | Wt 216.3 lb

## 2015-08-30 DIAGNOSIS — E785 Hyperlipidemia, unspecified: Secondary | ICD-10-CM | POA: Diagnosis not present

## 2015-08-30 DIAGNOSIS — I77810 Thoracic aortic ectasia: Secondary | ICD-10-CM

## 2015-08-30 DIAGNOSIS — I350 Nonrheumatic aortic (valve) stenosis: Secondary | ICD-10-CM | POA: Diagnosis not present

## 2015-08-30 DIAGNOSIS — I10 Essential (primary) hypertension: Secondary | ICD-10-CM | POA: Diagnosis not present

## 2015-08-30 DIAGNOSIS — M25552 Pain in left hip: Secondary | ICD-10-CM | POA: Diagnosis not present

## 2015-08-30 DIAGNOSIS — Q231 Congenital insufficiency of aortic valve: Secondary | ICD-10-CM

## 2015-08-30 DIAGNOSIS — Q230 Congenital stenosis of aortic valve: Secondary | ICD-10-CM

## 2015-08-30 MED ORDER — ATENOLOL 25 MG PO TABS: 25.0000 mg | ORAL_TABLET | Freq: Every day | ORAL | Status: DC

## 2015-08-30 NOTE — Patient Instructions (Signed)

## 2015-08-30 NOTE — Progress Notes (Signed)
Subjective:    Patient ID: Angela Dawson, female    DOB: 1945-09-25, 70 y.o.   MRN: [id number]  Patient here today for follow up of chronic medical problems.  Outpatient Encounter Prescriptions as of 08/30/2015  Medication Sig  . aspirin 81 MG tablet Take 81 mg by mouth daily.  Marland Kitchen atenolol (TENORMIN) 25 MG tablet TAKE 1 TABLET EVERY DAY  . hydrochlorothiazide (HYDRODIURIL) 25 MG tablet Take 1 tablet (25 mg total) by mouth daily.  Marland Kitchen ibuprofen (ADVIL,MOTRIN) 200 MG tablet Take 200 mg by mouth every 6 (six) hours as needed.  Marland Kitchen lisinopril (PRINIVIL,ZESTRIL) 20 MG tablet Take 1 tablet (20 mg total) by mouth daily.    Hypertension This is a chronic problem. The current episode started more than 1 year ago. The problem is unchanged. The problem is controlled. Pertinent negatives include no chest pain, headaches or shortness of breath. Risk factors for coronary artery disease include dyslipidemia and post-menopausal state. Past treatments include ACE inhibitors and diuretics. The current treatment provides moderate improvement. Compliance problems include diet and exercise.   Hyperlipidemia This is a chronic problem. The current episode started more than 1 year ago. The problem is controlled. Exacerbating diseases include obesity. Pertinent negatives include no chest pain or shortness of breath. Treatments tried: cannot toleratae statin. The current treatment provides moderate improvement of lipids. There are no compliance problems.  Risk factors for coronary artery disease include dyslipidemia, family history, hypertension, obesity and post-menopausal.  aortic dilitation/aortic stenosis due to bicuspid aortic valve Keep yearly follow up with cardiology. Has appointment for echo cardiogram on July 5th 2017. C/O left hip pain-started several years ago- pain is getting worse. Rates pain in left hip 5/5- standing and walking increases pain- ibuprofen helps with pain.      Review of Systems   Constitutional: Negative.   HENT: Negative.   Respiratory: Negative for shortness of breath.   Cardiovascular: Negative for chest pain.  Genitourinary: Negative.   Neurological: Negative for headaches.  Psychiatric/Behavioral: Negative.   All other systems reviewed and are negative.      Objective:   Physical Exam  Constitutional: She is oriented to person, place, and time. She appears well-developed and well-nourished.  HENT:  Nose: Nose normal.  Mouth/Throat: Oropharynx is clear and moist.  Eyes: EOM are normal.  Neck: Trachea normal, normal range of motion and full passive range of motion without pain. Neck supple. No JVD present. Carotid bruit is not present. No thyromegaly present.  Cardiovascular: Normal rate, regular rhythm and intact distal pulses.  Exam reveals no gallop and no friction rub.   Murmur (1/6 systolic murmur) heard. Pulmonary/Chest: Effort normal and breath sounds normal.  Abdominal: Soft. Bowel sounds are normal. She exhibits no distension and no mass. There is no tenderness.  Musculoskeletal: Normal range of motion.  Lymphadenopathy:    She has no cervical adenopathy.  Neurological: She is alert and oriented to person, place, and time. She has normal reflexes.  Skin: Skin is warm and dry.  Psychiatric: She has a normal mood and affect. Her behavior is normal. Judgment and thought content normal.   BP 128/64 mmHg  Pulse 51  Temp(Src) 97.3 F (36.3 C) (Oral)  Ht 5' (1.524 m)  Wt 216 lb 4.8 oz (98.113 kg)  BMI 42.24 kg/m2  Left hip x ray- mild joint space narrowing-Preliminary reading by Ronnald Collum, FNP  [facility]     Assessment & Plan:  1. Essential hypertension Do not add salt to diet - atenolol (  TENORMIN) 25 MG tablet; Take 1 tablet (25 mg total) by mouth daily.  Dispense: 90 tablet; Refill: 1 - CMP14+EGFR  2. Ascending aorta dilatation (HCC) Keep follow up with cardiology  3. Aortic stenosis due to bicuspid aortic valve Keep follow up with  cardiology  4. Hyperlipidemia Low fat diet - Lipid panel  5. Morbid obesity, unspecified obesity type ([place]) Discussed diet and exercise for person with BMI >25 Will recheck weight in 3-6 months   6. Left hip pain Continue ibuprofen as rx - DG HIP UNILAT W OR W/O PELVIS 2-3 VIEWS LEFT; Future    Labs pending Health maintenance reviewed Diet and exercise encouraged Continue all meds Follow up  In 3 months   Hope, FNP

## 2015-08-31 LAB — CMP14+EGFR
A/G RATIO: 1.6 (ref 1.2–2.2)
ALBUMIN: 4.1 g/dL (ref 3.6–4.8)
ALT: 15 IU/L (ref 0–32)
AST: 15 IU/L (ref 0–40)
Alkaline Phosphatase: 58 IU/L (ref 39–117)
BUN / CREAT RATIO: 20 (ref 12–28)
BUN: 17 mg/dL (ref 8–27)
Bilirubin Total: 0.3 mg/dL (ref 0.0–1.2)
CALCIUM: 9.4 mg/dL (ref 8.7–10.3)
CO2: 25 mmol/L (ref 18–29)
Chloride: 100 mmol/L (ref 96–106)
Creatinine, Ser: 0.83 mg/dL (ref 0.57–1.00)
GFR, EST AFRICAN AMERICAN: 83 mL/min/{1.73_m2} (ref >59)
GFR, EST NON AFRICAN AMERICAN: 72 mL/min/{1.73_m2} (ref >59)
Globulin, Total: 2.5 g/dL (ref 1.5–4.5)
Glucose: 98 mg/dL (ref 65–99)
POTASSIUM: 4 mmol/L (ref 3.5–5.2)
Sodium: 141 mmol/L (ref 134–144)
TOTAL PROTEIN: 6.6 g/dL (ref 6.0–8.5)

## 2015-08-31 LAB — LIPID PANEL
CHOL/HDL RATIO: 4.4 ratio (ref 0.0–4.4)
Cholesterol, Total: 218 mg/dL — ABNORMAL HIGH (ref 100–199)
HDL: 49 mg/dL (ref >39)
LDL Calculated: 143 mg/dL — ABNORMAL HIGH (ref 0–99)
Triglycerides: 128 mg/dL (ref 0–149)
VLDL CHOLESTEROL CAL: 26 mg/dL (ref 5–40)

## 2015-09-08 ENCOUNTER — Other Ambulatory Visit: Payer: Self-pay

## 2015-09-08 ENCOUNTER — Ambulatory Visit (INDEPENDENT_AMBULATORY_CARE_PROVIDER_SITE_OTHER): Payer: Medicare Other

## 2015-09-08 DIAGNOSIS — Q231 Congenital insufficiency of aortic valve: Secondary | ICD-10-CM | POA: Diagnosis not present

## 2015-09-08 DIAGNOSIS — Q230 Congenital stenosis of aortic valve: Secondary | ICD-10-CM

## 2015-09-08 DIAGNOSIS — I350 Nonrheumatic aortic (valve) stenosis: Secondary | ICD-10-CM | POA: Diagnosis not present

## 2015-09-08 LAB — ECHOCARDIOGRAM COMPLETE
AOPV: 0.28 m/s
AV Mean grad: 34 mmHg
AV Peak grad: 61 mmHg
AV pk vel: 390 cm/s
AV vel: 1.01
AVAREAMEANV: 0.93 cm2
AVAREAMEANVIN: 0.44 cm2/m2
AVAREAVTI: 0.89 cm2
AVAREAVTIIND: 0.48 cm2/m2
AVCELMEANRAT: 0.3
AVPHT: 779 ms
Ao-asc: 46 cm
CHL CUP AV PEAK INDEX: 0.42
CHL CUP DOP CALC LVOT VTI: 31.8 cm
CHL CUP MV DEC (S): 198
DOP CAL AO MEAN VELOCITY: 273 cm/s
EERAT: 11.99
EWDT: 198 ms
FS: 35 % (ref 28–44)
IV/PV OW: 1.03
LA vol A4C: 58.6 ml
LA vol index: 32.7 mL/m2
LA vol: 68.5 mL
LADIAMINDEX: 1.76 cm/m2
LASIZE: 37 mm
LDCA: 3.14 cm2
LEFT ATRIUM END SYS DIAM: 37 mm
LV E/e'average: 11.99
LV PW d: 13.7 mm — AB (ref 0.6–1.1)
LV TDI E'LATERAL: 9.09
LV TDI E'MEDIAL: 5.9
LV dias vol: 43 mL — AB (ref 46–106)
LV e' LATERAL: 9.09 cm/s
LVDIAVOLIN: 20 mL/m2
LVEEMED: 11.99
LVOT SV: 100 mL
LVOTD: 20 mm
LVOTPV: 111 cm/s
LVOTVTI: 0.32 cm
LVSYSVOL: 12 mL — AB (ref 14–42)
LVSYSVOLIN: 6 mL/m2
MV Peak grad: 5 mmHg
MV pk A vel: 76.2 m/s
MVPKEVEL: 109 m/s
Simpson's disk: 72
Stroke v: 31 ml
TAPSE: 19.7 mm
VTI: 98.8 cm
Valve area index: 0.48
Valve area: 1.01 cm2

## 2015-09-10 ENCOUNTER — Telehealth: Payer: Self-pay | Admitting: *Deleted

## 2015-09-10 NOTE — Telephone Encounter (Signed)
-----   Message from Antoine PocheJonathan F Branch, MD sent at 09/10/2015 12:50 PM EDT ----- Echo shows her aortic valve remains overall stable in the moderate to severe range. We will discuss in more details at her appt next week with me  Dominga FerryJ Branch MD

## 2015-09-10 NOTE — Telephone Encounter (Signed)
Pt aware, routed to pcp 

## 2015-09-16 ENCOUNTER — Encounter: Payer: Self-pay | Admitting: Cardiothoracic Surgery

## 2015-09-16 ENCOUNTER — Encounter: Payer: Self-pay | Admitting: *Deleted

## 2015-09-16 ENCOUNTER — Ambulatory Visit (INDEPENDENT_AMBULATORY_CARE_PROVIDER_SITE_OTHER): Payer: Medicare Other | Admitting: Cardiothoracic Surgery

## 2015-09-16 ENCOUNTER — Encounter: Payer: Self-pay | Admitting: Cardiology

## 2015-09-16 ENCOUNTER — Ambulatory Visit (INDEPENDENT_AMBULATORY_CARE_PROVIDER_SITE_OTHER): Payer: Medicare Other | Admitting: Cardiology

## 2015-09-16 VITALS — BP 138/72 | HR 59 | Resp 16 | Ht 60.0 in | Wt 217.0 lb

## 2015-09-16 VITALS — BP 131/80 | HR 45 | Ht 60.0 in | Wt 217.2 lb

## 2015-09-16 DIAGNOSIS — I350 Nonrheumatic aortic (valve) stenosis: Secondary | ICD-10-CM

## 2015-09-16 DIAGNOSIS — Q231 Congenital insufficiency of aortic valve: Secondary | ICD-10-CM

## 2015-09-16 DIAGNOSIS — R001 Bradycardia, unspecified: Secondary | ICD-10-CM

## 2015-09-16 DIAGNOSIS — I10 Essential (primary) hypertension: Secondary | ICD-10-CM | POA: Diagnosis not present

## 2015-09-16 DIAGNOSIS — I712 Thoracic aortic aneurysm, without rupture: Secondary | ICD-10-CM

## 2015-09-16 DIAGNOSIS — I77810 Thoracic aortic ectasia: Secondary | ICD-10-CM | POA: Diagnosis not present

## 2015-09-16 DIAGNOSIS — I7121 Aneurysm of the ascending aorta, without rupture: Secondary | ICD-10-CM

## 2015-09-16 DIAGNOSIS — Q230 Congenital stenosis of aortic valve: Secondary | ICD-10-CM

## 2015-09-16 MED ORDER — ATENOLOL 25 MG PO TABS: 12.5000 mg | ORAL_TABLET | Freq: Every day | ORAL | Status: DC

## 2015-09-16 NOTE — Patient Instructions (Signed)
Your physician wants you to follow-up in: 6 MONTHS WITH DR. BRANCH You will receive a reminder letter in the mail two months in advance. If you don't receive a letter, please call our office to schedule the follow-up appointment.  Your physician has recommended you make the following change in your medication:   DECREASE ATENOLOL 12.5 MG DAILY  Thank you for choosing [facility]!!

## 2015-09-16 NOTE — Progress Notes (Signed)
[address]       [place] [address]             [phone]                    BRYLIN STOPPER [facility] Medical Record #[id number] Date of Birth: [birth year]  Referring: Antoine Poche, MD Primary Care: Bennie Pierini, FNP  Chief Complaint:    Chief Complaint  Patient presents with  . Follow-up    after cardiolgy visit  with ECHO 09/08/15    History of Present Illness:    Angela Dawson 70 y.o. female is seen in the office 8 months ago for evaluation   moderately dilated ascending aorta. She has also noted to have moderate  aortic stenosis  of a bicuspid aortic valve. The patient Was asymptomatic from a cardiac standpoint. 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 She denies  PND, pedal edema, syncope, presyncope..   Other than hypertension the patient has no previous history of cardiac disease  She does have a family history of cardiac disease her father died of myocardial infarction age 73   No family history of aortic dissection  Current Activity/ Functional Status:  Patient is independent with mobility/ambulation, transfers, ADL's, IADL's.   Zubrod Score: At the time of surgery this patient's most appropriate activity status/level should be described as: []     0    Normal activity, no symptoms [x]     1    Restricted in physical strenuous activity but ambulatory, able to do out light work []     2    Ambulatory and capable of self care, unable to do work activities, up and about               >50 % of waking hours                              []     3    Only limited self care, in bed greater than 50% of waking hours []     4    Completely disabled, no self care, confined to bed or chair []     5    Moribund   Past Medical History  Diagnosis Date  . Hypertension      Past Surgical History  Procedure Laterality Date  . Tubal ligation      Family History  Problem Relation Age of Onset  . Alzheimer's disease Mother   . Heart attack Father       History  Smoking status  . Former Smoker -- 0.50 packs/day for 5 years  . Types: Cigarettes  . Start date: 08/04/1968  . Quit date: 10/10/1973  Smokeless tobacco  . Never Used    Comment: smoked less than 1 pack every 2 weeks. Havent smoked in atleast 40 years    History  Alcohol Use No     Allergies  Allergen Reactions  . Crestor [Rosuvastatin]     Dizziness  . Lipitor [Atorvastatin]     Unable to stand or move  . Statins     Leg cramps   . Penicillins Rash    Current Outpatient Prescriptions  Medication Sig Dispense Refill  . aspirin 81 MG tablet Take  81 mg by mouth daily.    Marland Kitchen atenolol (TENORMIN) 25 MG tablet Take 0.5 tablets (12.5 mg total) by mouth daily. 45 tablet 3  . hydrochlorothiazide (HYDRODIURIL) 25 MG tablet Take 1 tablet (25 mg total) by mouth daily. (Patient taking differently: Take 12.5 mg by mouth daily. ) 90 tablet 3  . ibuprofen (ADVIL,MOTRIN) 200 MG tablet Take 200 mg by mouth every 6 (six) hours as needed.    Marland Kitchen lisinopril (PRINIVIL,ZESTRIL) 20 MG tablet Take 1 tablet (20 mg total) by mouth daily. 90 tablet 3   No current facility-administered medications for this visit.     Review of Systems:     Cardiac Review of Systems: Y or N  Chest Pain [ n   ]  Resting SOB [n   ] Exertional SOB  [ n ]  Orthopnea [n  ]   Pedal Edema [ n  ]    Palpitations [n  ] Syncope  Milo.Brash  ]   Presyncope [ n  ]  General Review of Systems: [Y] = yes [  ]=no Constitional: recent weight change [  ];  Wt loss over the last 3 months [  n ] anorexia [  ]; fatigue [n  ]; nausea [  ]; night sweats [  ]; fever [  ]; or chills [  ];          Dental: poor dentition[  ]; Last Dentist visit:   Eye : blurred vision [ n ]; diplopia [  n ]; vision changes [ n ];  Amaurosis fugax[n  ]; Resp: cough [   ];  wheezing[n  ];  hemoptysis[n  ]; shortness of breath[ n ]; paroxysmal nocturnal dyspnea[  ]; dyspnea on exertion[  ]; or orthopnea[  ];  GI:  gallstones[  ], vomiting[  ];  dysphagia[  ]; melena[  ];  hematochezia [  ]; heartburn[  ];   Hx of  Colonoscopy[  ]; GU: kidney stones [  ]; hematuria[  ];   dysuria [  ];  nocturia[  ];  history of     obstruction [  ]; urinary frequency [  ]             Skin: rash, swelling[  ];, hair loss[  ];  peripheral edema[  ];  or itching[  ]; Musculosketetal: myalgias[  ];  joint swelling[  ];  joint erythema[  ];  joint pain[  ];  back pain[  ];  Heme/Lymph: bruising[  ];  bleeding[  ];  anemia[  ];  Neuro: TIA[n  ];  headaches[  ];  stroke[  ];  vertigo[  ];  seizures[  ];   paresthesias[ n ];  difficulty walking[n  ];  Psych:depression[ n ]; anxiety[  ];  Endocrine: diabetes[  ];  thyroid dysfunction[  ];  Immunizations: Flu up to date Cove.Etienne  ]; Pneumococcal up to date Cove.Etienne  ];  Other:  Physical Exam: BP 138/72 mmHg  Pulse 59  Resp 16  Ht 5' (1.524 m)  Wt 217 lb (98.431 kg)  BMI 42.38 kg/m2  SpO2 96%  PHYSICAL EXAMINATION:  General appearance: alert, cooperative, appears stated age and no distress Neurologic: intact Heart: systolic murmur: early systolic 3/6, crescendo at 2nd right intercostal space Lungs: clear to auscultation bilaterally Abdomen: soft, non-tender; bowel sounds normal; no masses,  no organomegaly Extremities: extremities normal, atraumatic, no cyanosis or edema and Homans sign is negative, no sign of DVT Patient has no carotid  bruits, has full and equal brachial radial and pedal pulses Has no cervical or supraclavicular adenopathy  Diagnostic Studies & Laboratory data:     Recent Radiology Findings:    Dg Hip Unilat W Or W/o Pelvis 2-3 Views Left  08/30/2015  CLINICAL DATA:  Left hip pain without history of trauma EXAM: DG HIP (WITH OR WITHOUT PELVIS) 2-3V LEFT COMPARISON:  None in PACs FINDINGS: The bony pelvis is  subjectively osteopenic. There is no acute or healing fracture. AP and lateral views of the left hip reveal reasonable preservation of the joint space. The articular surfaces of the femoral head and acetabulum remains smoothly rounded. The femoral neck, intertrochanteric, and subtrochanteric regions appear normal. IMPRESSION: There is no acute or significant chronic bony abnormality of the left hip. Electronically Signed   By: David  [place] [name]   On: 08/30/2015 09:48    Ct Angio Chest Aorta W/cm &/or Wo/cm  01/26/2015  CLINICAL DATA:  Followup thoracic ascending aorta aneurysm EXAM: CT ANGIOGRAPHY CHEST WITH CONTRAST TECHNIQUE: Multidetector CT imaging of the chest was performed using the standard protocol during bolus administration of intravenous contrast. Multiplanar CT image reconstructions and MIPs were obtained to evaluate the vascular anatomy. CONTRAST:  75 mL Isovue 370 COMPARISON:  06/04/2014 FINDINGS: The lungs are well aerated bilaterally without focal infiltrate, effusion or sizable parenchymal nodule. The thoracic inlet shows no acute abnormality. The thoracic aorta again demonstrates dilatation of the ascending portion. At the level of the main pulmonary artery it measures approximately 4.6 x 4.6 cm. It tapers in the arch and the descending thoracic aorta is within normal limits. No dissection is seen. Heavy calcification of the aortic arch is noted The pulmonary artery as visualized shows no large central pulmonary emboli. No hilar or mediastinal adenopathy is seen. Mild coronary calcifications are seen. Scanning into the upper abdomen reveals no acute abnormality. The osseous structures show no acute abnormality. Degenerative changes of the thoracic spine are seen. Review of the MIP images confirms the above findings. IMPRESSION: Dilatation of the thoracic aorta in its ascending component measuring 4.6 x 4.6 cm in greatest dimension. This is likely stable from the prior exam as some motion  artifact was seen on the prior study. No other focal abnormality is seen. Electronically Signed   By: Alcide Clever [name]   On: 01/26/2015 14:10   Ct Angio Chest Aorta W/cm &/or Wo/cm   06/04/2014   CLINICAL DATA:  Aortic aneurysm  EXAM: CT ANGIOGRAPHY CHEST WITH CONTRAST  TECHNIQUE: Multidetector CT imaging of the chest was performed using the standard protocol during bolus administration of intravenous contrast. Multiplanar CT image reconstructions and MIPs were obtained to evaluate the vascular anatomy.  CONTRAST:  75 cc Isovue 370  COMPARISON:  10/21/2013  FINDINGS: Maximal aortic diameters at the sinus of Valsalva, sino-tubular junction, and ascending aorta are 3.6 cm, 3.2 cm, and 4.9 cm respectively. Previous maximal diameter was 4.8 cm.  No evidence of aortic dissection or intramural hematoma. Moderate aortic valvular calcifications are noted.  Great vessels are patent within the confines of the study. Bilateral vertebral arteries are also patent.  No obvious evidence of acute pulmonary thromboembolism.  Minimal LAD territory coronary artery calcification. Trace left main coronary artery calcification.  No abnormal mediastinal adenopathy. Small mediastinal and bilateral axillary nodes.  Patchy ground-glass and airspace opacities in the lingula. There is a 2 mm nodule in the lingula on image 25.  No vertebral compression deformity.  Review of the MIP images confirms the  above findings.  IMPRESSION: Maximal diameter of the ascending aorta is 4.9 cm compare with 4.8 cm on the prior study. No evidence of dissection.  There are airspace and ground-glass opacities in the left upper lobe most consistent with an inflammatory process. There is also a 2 mm nodule. If the patient is at high risk for bronchogenic carcinoma, follow-up chest CT at 1 year is recommended. If the patient is at low risk, no follow-up is needed. This recommendation follows the consensus statement: Guidelines for Management of Small Pulmonary  Nodules Detected on CT Scans: A Statement from the Fleischner Society as published in Radiology 2005; 237:395-400.   Electronically Signed   By: Jolaine Click [name]   On: 06/04/2014 14:57    EXAM:  CT ANGIOGRAPHY CHEST WITH CONTRAST  TECHNIQUE:  Multidetector CT imaging of the chest was performed using the  standard protocol during bolus administration of intravenous  contrast. Multiplanar CT image reconstructions and MIPs were  obtained to evaluate the vascular anatomy.  CONTRAST: OMNIPAQUE IOHEXOL 350 MG/ML SOLN IV  COMPARISON: None  FINDINGS:  Fusiform aneurysmal dilatation ascending thoracic aorta 4.8 x 4.6 cm  image 35.  Aortic arch and descending thoracic aorta normal caliber.  Mild scattered atherosclerotic plaque formation thoracic aorta.  No evidence of aortic dissection or saccular aneurysm.  Plaque identified at origin of the celiac artery.  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  Pulmonary arteries well opacified and patent.  Visualized upper abdomen otherwise unremarkable.  No thoracic adenopathy.  Lungs clear.  No pleural effusion or pneumothorax.  Osseous structures unremarkable.  Review of the MIP images confirms the above findings.  IMPRESSION:  Fusiform aneurysmal dilatation ascending thoracic aorta measuring a  maximum of 4.8 x 4.6 cm.  Minimal scattered atherosclerotic calcifications.  Electronically Signed  By: Ulyses Southward [name]  On: 10/21/2013 09:50  No cardiac cath  Recent Lab Findings: Lab Results  Component Value Date   GLUCOSE 98 08/30/2015   CHOL 218* 08/30/2015   TRIG 128 08/30/2015   HDL 49 08/30/2015   LDLCALC 143* 08/30/2015   ALT 15 08/30/2015   AST 15 08/30/2015   NA 141 08/30/2015   K 4.0 08/30/2015   CL 100 08/30/2015   CREATININE 0.83 08/30/2015   BUN 17 08/30/2015   CO2 25 08/30/2015    ECHO7/2017 Study Conclusions  - Left ventricle: The cavity size was normal. Wall thickness  was  increased in a pattern of moderate LVH. Systolic function was  normal. The estimated ejection fraction was in the range of 60%  to 65%. Wall motion was normal; there were no regional wall  motion abnormalities. Features are consistent with a pseudonormal  left ventricular filling pattern, with concomitant abnormal  relaxation and increased filling pressure (grade 2 diastolic  dysfunction). Doppler parameters are consistent with high  ventricular filling pressure. - Aortic valve: Moderately thickened, severely calcified leaflets.  There was moderate to severe stenosis. There was mild  regurgitation. Peak velocity (S): 390 cm/s. Mean gradient (S): 34  mm Hg. Valve area (VTI): 1.01 cm^2. Valve area (Vmax): 0.89 cm^2.  Valve area (Vmean): 0.93 cm^2. - Aorta: Ascending aortic diameter: 46 mm (S). - Ascending aorta: The ascending aorta was mild to moderately  dilated. - Mitral valve: Calcified annulus. There was mild regurgitation. - Left atrium: The atrium was moderately dilated. - Right ventricle: Systolic function was mildly to moderately  reduced. - Tricuspid valve: There was mild-moderate regurgitation.  Transthoracic echocardiography. M-mode, complete 2D, spectral Doppler, and color Doppler. Birthdate: Patient birthdate: [birth year]. Age: Patient is 70 yr old. Sex: Gender: female. BMI: 42.2 kg/m^2. Blood pressure: 126/76 Patient status: Inpatient. Study date: Study date: 09/08/2015. Study time: 10:33 AM.  -------------------------------------------------------------------  ------------------------------------------------------------------- Left ventricle: The cavity size was normal. Wall thickness was increased in a pattern of moderate LVH. Systolic function was normal. The estimated ejection fraction was in the range of 60% to 65%. Wall motion was normal; there were no regional wall motion abnormalities. Features are consistent with a  pseudonormal left ventricular filling pattern, with concomitant abnormal relaxation and increased filling pressure (grade 2 diastolic dysfunction). Doppler parameters are consistent with high ventricular filling pressure.  ------------------------------------------------------------------- Aortic valve: Moderately thickened, severely calcified leaflets. Doppler: There was moderate to severe stenosis. There was mild regurgitation. VTI ratio of LVOT to aortic valve: 0.32. Valve area (VTI): 1.01 cm^2. Indexed valve area (VTI): 0.48 cm^2/m^2. Peak velocity ratio of LVOT to aortic valve: 0.28. Valve area (Vmax): 0.89 cm^2. Indexed valve area (Vmax): 0.42 cm^2/m^2. Mean velocity ratio of LVOT to aortic valve: 0.3. Valve area (Vmean): 0.93 cm^2. Indexed valve area (Vmean): 0.44 cm^2/m^2. Mean gradient (S): 34 mm Hg. Peak gradient (S): 61 mm Hg.  ------------------------------------------------------------------- Aorta: Aortic root: The aortic root was normal in size. Ascending aorta: The ascending aorta was mild to moderately dilated.  ------------------------------------------------------------------- Mitral valve: Calcified annulus. Doppler: There was mild regurgitation. Peak gradient (D): 5 mm Hg.  ------------------------------------------------------------------- Left atrium: The atrium was moderately dilated.  ------------------------------------------------------------------- Atrial septum: No defect or patent foramen ovale was identified.  ------------------------------------------------------------------- Right ventricle: The cavity size was normal. Systolic function was mildly to moderately reduced.  ------------------------------------------------------------------- Pulmonic valve: The valve appears to be grossly normal. Doppler: There was trivial  regurgitation.  ------------------------------------------------------------------- Tricuspid valve: The valve appears to be grossly normal. Doppler: There was mild-moderate regurgitation.  ------------------------------------------------------------------- Right atrium: The atrium was normal in size.  ------------------------------------------------------------------- Pericardium: There was no pericardial effusion.  ------------------------------------------------------------------- Systemic veins: Inferior vena cava: The vessel was normal in size. The respirophasic diameter changes were in the normal range (>= 50%), consistent with normal central venous pressure.  ------------------------------------------------------------------- Measurements  Left ventricle Value Reference LV ID, ED, PLAX chordal (L) 38.4 mm 43 - 52 LV ID, ES, PLAX chordal 24.8 mm 23 - 38 LV fx shortening, PLAX chordal 35 % >=29 LV PW thickness, ED 13.7 mm --------- IVS/LV PW ratio, ED 1.03 <=1.3 Stroke volume, 2D 100 ml --------- Stroke volume/bsa, 2D 48 ml/m^2 --------- LV ejection fraction, 1-p A4C 76 % --------- LV end-diastolic volume, 2-p 43 ml --------- LV end-systolic volume, 2-p 12 ml --------- LV ejection fraction, 2-p 72 % --------- Stroke volume, 2-p 31 ml --------- LV end-diastolic volume/bsa, 2-p 20 ml/m^2 --------- LV end-systolic volume/bsa, 2-p 6 ml/m^2 --------- Stroke volume/bsa, 2-p 14.6 ml/m^2 --------- LV e&', lateral 9.09 cm/s  --------- LV E/e&', lateral 11.99 --------- LV e&', medial 5.9 cm/s --------- LV E/e&', medial 18.47 --------- LV e&', average 7.5 cm/s --------- LV E/e&', average 14.54 --------- Longitudinal strain, TDI 21 % ---------  Ventricular septum Value Reference IVS thickness, ED 14.1 mm ---------  LVOT Value Reference LVOT ID, S 20 mm --------- LVOT area 3.14 cm^2 --------- LVOT ID 20 mm --------- LVOT peak velocity, S 111 cm/s --------- LVOT mean velocity, S 80.8 cm/s --------- LVOT VTI, S 31.8 cm --------- Stroke volume (SV), LVOT DP 99.9 ml --------- Stroke index (SV/bsa), LVOT DP 47.7 ml/m^2 ---------  Aortic valve Value Reference Aortic valve  peak velocity, S 390 cm/s --------- Aortic valve mean velocity, S 273 cm/s --------- Aortic valve VTI, S 98.8 cm --------- Aortic mean gradient, S 34 mm Hg --------- Aortic peak gradient, S 61 mm Hg --------- VTI ratio, LVOT/AV 0.32 --------- Aortic valve area, VTI 1.01 cm^2 --------- Aortic valve area/bsa, VTI 0.48 cm^2/m^2 --------- Velocity ratio, peak, LVOT/AV 0.28 --------- Aortic valve area, peak velocity 0.89 cm^2  --------- Aortic valve area/bsa, peak 0.42 cm^2/m^2 --------- velocity Velocity ratio, mean, LVOT/AV 0.3 --------- Aortic valve area, mean velocity 0.93 cm^2 --------- Aortic valve area/bsa, mean 0.44 cm^2/m^2 --------- velocity Aortic regurg pressure half-time 779 ms ---------  Aorta Value Reference Aortic root ID, ED 35 mm --------- Ascending aorta ID, A-P, S 46 mm ---------  Left atrium Value Reference LA ID, A-P, ES 37 mm --------- LA ID/bsa, A-P 1.76 cm/m^2 <=2.2 LA volume, S 68.5 ml --------- LA volume/bsa, S 32.7 ml/m^2 --------- LA volume, ES, 1-p A4C 58.6 ml --------- LA volume/bsa, ES, 1-p A4C 28 ml/m^2 --------- LA volume, ES, 1-p A2C 71.5 ml --------- LA volume/bsa, ES, 1-p A2C 34.1 ml/m^2 ---------  Mitral valve Value Reference Mitral E-wave peak velocity 109 cm/s --------- Mitral A-wave peak velocity 76.2 cm/s --------- Mitral deceleration time 198 ms 150 - 230 Mitral peak gradient, D 5 mm Hg --------- Mitral E/A ratio, peak 1.4 ---------  Tricuspid valve Value Reference Tricuspid regurg peak velocity 268 cm/s --------- Tricuspid peak RV-RA gradient 29 mm Hg ---------  Right ventricle Value  Reference TAPSE 19.7 mm --------- RV s&', lateral, S 8 cm/s ---------  Pulmonic valve Value Reference Pulmonic valve peak velocity, S 62.6 cm/s ---------  Legend: (L) and (H) mark values outside specified reference range.  ------------------------------------------------------------------- Prepared and Electronically Authenticated by  Prentice Docker, MD 2017-07-05T16:06:02    ECHO 09/2013 Study Conclusions  - Left ventricle: The cavity size was normal. Wall thickness was increased in a pattern of mild LVH. Systolic function was vigorous. The estimated ejection fraction was in the range of 65% to 70%. Wall motion was normal; there were no regional wall motion abnormalities. Doppler parameters are consistent with abnormal left ventricular relaxation (grade 1 diastolic dysfunction). - Aortic valve: Bicuspid; moderately calcified leaflets. Cusp separation was reduced. There was moderate to severe stenosis. There was mild regurgitation. Mean gradient (S): 21 mm Hg. LVOT/AV VTI ratio 0.32. Peak gradient (S): 40 mm Hg. Valve area (VTI): 0.93 cm^2. Valve area (Vmax): 0.96 cm^2. - Ascending aorta: The ascending aorta was moderately dilated. Approximately 46 mm. - Mitral valve: Calcified annulus. There was trivial regurgitation. - Right atrium: Central venous pressure (est): 3 mm Hg. - Atrial septum: No defect or patent foramen ovale was identified. - Tricuspid valve: There was trivial regurgitation. - Pulmonary arteries: PA peak pressure: 29 mm Hg (S). - Pericardium, extracardiac: There was no pericardial effusion  Peak velocity aortic valve 315 cm/sec  Study Conclusions  - Left ventricle: The cavity size was normal. Systolic function was vigorous. The estimated ejection fraction was in the range of 65% to 70%. Wall motion was normal;  there were no regional wall motion abnormalities. Features are consistent with a pseudonormal left ventricular filling pattern, with concomitant abnormal relaxation and increased filling pressure (grade 2 diastolic dysfunction). Doppler parameters are consistent with high ventricular filling pressure. Mild to moderate concentric left ventricular hypertrophy. - Aortic valve: Mildly to moderately calcified annulus. Moderately thickened, severely calcified leaflets. Cusp separation was reduced. There was moderate to severe stenosis. There was mild regurgitation. Peak velocity (S): 364 cm/s.  Mean gradient: 32 mmHg. Valve area (Vmax): 0.91 cm^2. - Aorta: Ascending aortic diameter: 43 mm (S). Mild ascending aortic dilatation. - Mitral valve: Mildly to moderately calcified annulus. There was mild regurgitation.  Impressions:  - When compared to the report dated 10/02/13, the mean aortic valve gradient has increased by 11 mmHg. Moderate to severe stenosis is seen. Aortic Size Index=    4.8    /Body surface area is 2.04 meters squared. = 2.38  < 2.75 cm/m2      4% risk per year 2.75 to 4.25          8% risk per year > 4.25 cm/m2    20% risk per year   Assessment / Plan:   Patient symptomatic  aortic stenosis, peak velocity across the aortic valve estimated at 390   Cm/s Meangradient (S): 34 mm Hg. Peak gradient (S): 61 mm Hg. and  probable bicuspid aortic valve with the aorta dilated to 4.7cm. She notes increasing symptoms of sob with activity.    The severity of her aortic stenosis has  Increasing  gradient  from previous echo. With increasing gradient and symptoms I have recommended to the patient proceeding with cardiac cath  And AVR  and replacement of Ascending  aorta.  Use of mechanical versus tissue valve is been discussed with the patient she prefers at her age of 40 a tissue valve and not having to take Coumadin. She would like to Have cardiac cath before AUG  19 . I will see her after cardiac cath and decided on timing of surgery. She has some dental issues and currently see dentist to get these resolved before surgery.   The [employer] [facility]) and the [place] Heart Association [facility]) have issued a statement to clarify 2 previous guidelines from the [facility], AHA, and collaborating societies addressing the risk of aortic dissection in patients with bicuspid aortic valves (BAV) and severe aortic enlargement. The 2 guidelines differ with regard to the recommended threshold of aortic root or ascending aortic dilatation that would justify surgical intervention in patients with bicuspid aortic valves. This new statement of clarification uses the ACC/AHA revised structure for delineating the Class of Recommendation and Level of Evidence to provide recommendation that replace those contained in Section 9.2.2.1 of the thoracic aortic disease guidelines and Section 5.1.3 of the valvular heart disease guideline. New recommendations in intervention in patients with BAV and dilatation of the aortic root (sinuses) or ascending aorta include:  . Operative intervention to repair or replace the aortic root (sinuses) or replace the ascending aorta is indicated in asymptomatic patients with BAV if the diameter of the aortic root or ascending aorta is 5.5 cm or greater. (Class of recommendation 1, Level of evidence B-NR).  Marland Kitchen Operative intervention to repair or replace the aortic root (sinuses) or replace the ascending aorta is reasonable in asymptomatic patients with BAV if the diameter of the aortic root or ascending aorta is 5.0 cm or greater and an additional risk factor for dissection is present or if the patient is at low surgical risk and the surgery is performed by an experienced aortic surgical team in a center with established expertise in these procedures. (Class of recommendation IIa; Level of Evidence B-NR).  . Replacement of the ascending aorta is  reasonable in patients with BAV undergoing AVR because of severe aortic stenosis or aortic regurgitation when the diameter of the ascending aorta is greater than 4.5 cm (Class of recommendation IIa; Level of evidence C-EO).  Citation: Gae BonHalperin JL, Kristen LoaderLevine GN, Anderson JL, et al. Surgery for aortic dilatation in patients with bicuspid aortic valves. A statement of clarification from the [employer]/American Heart Association Task Force on Clinical Practice Guidelines. [Published online ahead of print February 06, 2014]. Circulation. doi: 10.1161/CIR.0000000000000331.   Delight OvensEdward B Rhen Dossantos MD      [address] [place] [id number] Office [phone]   Beeper [phone]  09/16/2015 4:08 PM

## 2015-09-16 NOTE — Progress Notes (Signed)
Clinical Summary Ms. Colford is a 70 y.o.female  seen today for follow up of the following medical problems.   1. Aortic stenosis with bicuspid AV and ascending aortic aneurysm.  - 09/2013 echo showed LVEF 65-70%, grade I diastolic dysfunction. Bicuspid AV with moderate to severe AS (mean grad 21, dimensionless index 0.32, valve area 0.93), mild AI. Described mild aorta dilatation from limited views, ascending aorta 4.6 cm. - CTA chest 05/2014 with asscending thoracic aortic aneurysm 4.9 cm.  01/2015 CTA chest ascending aortic aneurysm 4.6 x 4.6 cm. Forllowed by Dr Tyrone Sage.  -06/2014 echo LVEF 65-70%, AVA area 0.91, mean grad 32 - 09/2015 echo LVEF 60-65%, mod to severe AS mean grad 34, AVA 1   - notes some increased fatigue with activities since last visit. Example walking up hill from her mailbox, symptoms  progressing over the last few months. No chest pains. No syncope. Occasional LE edema.   2. HL - not able to tolerate statins, most recently tried pravastatin.  - 02/2015 TC 232 TG 120 HDL 51 LDL 157 - did not start zetia, cost $300  3. 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 Hypertension      Allergies  Allergen Reactions  . Crestor [Rosuvastatin]     Dizziness  . Lipitor [Atorvastatin]     Unable to stand or move  . Statins     Leg cramps   . Penicillins Rash     Current Outpatient Prescriptions  Medication Sig Dispense Refill  . aspirin 81 MG tablet Take 81 mg by mouth daily.    Marland Kitchen atenolol (TENORMIN) 25 MG tablet Take 1 tablet (25 mg total) by mouth daily. 90 tablet 1  . hydrochlorothiazide (HYDRODIURIL) 25 MG tablet Take 1 tablet (25 mg total) by mouth daily. 90 tablet 3  . ibuprofen (ADVIL,MOTRIN) 200 MG tablet Take 200 mg by mouth every 6 (six) hours as needed.    Marland Kitchen lisinopril (PRINIVIL,ZESTRIL) 20 MG tablet Take 1 tablet (20 mg  total) by mouth daily. 90 tablet 3   No current facility-administered medications for this visit.     Past Surgical History  Procedure Laterality Date  . Tubal ligation       Allergies  Allergen Reactions  . Crestor [Rosuvastatin]     Dizziness  . Lipitor [Atorvastatin]     Unable to stand or move  . Statins     Leg cramps   . Penicillins Rash      Family History  Problem Relation Age of Onset  . Alzheimer's disease Mother   . Heart attack Father      Social History Ms. Gravlin reports that she quit smoking about 41 years ago. Her smoking use included Cigarettes. She started smoking about 47 years ago. She has a 2.5 pack-year smoking history. She has never used smokeless tobacco. Ms. Marland reports that she does not drink alcohol.   Review of Systems CONSTITUTIONAL: No weight loss, fever, chills, weakness or fatigue.  HEENT: Eyes: No visual loss, blurred vision, double vision or yellow sclerae.No hearing loss, sneezing, congestion, runny nose or sore throat.  SKIN: No rash or itching.  CARDIOVASCULAR: per HPI RESPIRATORY: No shortness of breath, cough or sputum.  GASTROINTESTINAL: No anorexia, nausea, vomiting or diarrhea. No abdominal pain or blood.  GENITOURINARY: No burning on urination, no polyuria NEUROLOGICAL: No headache,  dizziness, syncope, paralysis, ataxia, numbness or tingling in the extremities. No change in bowel or bladder control.  MUSCULOSKELETAL: No muscle, back pain, joint pain or stiffness.  LYMPHATICS: No enlarged nodes. No history of splenectomy.  PSYCHIATRIC: No history of depression or anxiety.  ENDOCRINOLOGIC: No reports of sweating, cold or heat intolerance. No polyuria or polydipsia.  Marland Kitchen   Physical Examination Filed Vitals:   09/16/15 0952  BP: 131/80  Pulse: 45   Filed Vitals:   09/16/15 0952  Height: 5' (1.524 m)  Weight: 217 lb 3.2 oz (98.521 kg)    Gen: resting comfortably, no acute distress HEENT: no scleral icterus,  pupils equal round and reactive, no palptable cervical adenopathy,  CV: RRR, no m/r/g, no jvd Resp: Clear to auscultation bilaterally GI: abdomen is soft, non-tender, non-distended, normal bowel sounds, no hepatosplenomegaly MSK: extremities are warm, no edema.  Skin: warm, no rash Neuro:  no focal deficits Psych: appropriate affect   Diagnostic Studies 09/2013 Echo Study Conclusions  - Left ventricle: The cavity size was normal. Wall thickness was increased in a pattern of mild LVH. Systolic function was vigorous. The estimated ejection fraction was in the range of 65% to 70%. Wall motion was normal; there were no regional wall motion abnormalities. Doppler parameters are consistent with abnormal left ventricular relaxation (grade 1 diastolic dysfunction). - Aortic valve: Bicuspid; moderately calcified leaflets. Cusp separation was reduced. There was moderate to severe stenosis. There was mild regurgitation. Mean gradient (S): 21 mm Hg. LVOT/AV VTI ratio 0.32. Peak gradient (S): 40 mm Hg. Valve area (VTI): 0.93 cm^2. Valve area (Vmax): 0.96 cm^2. - Ascending aorta: The ascending aorta was moderately dilated. Approximately 46 mm. - Mitral valve: Calcified annulus. There was trivial regurgitation. - Right atrium: Central venous pressure (est): 3 mm Hg. - Atrial septum: No defect or patent foramen ovale was identified. - Tricuspid valve: There was trivial regurgitation. - Pulmonary arteries: PA peak pressure: 29 mm Hg (S). - Pericardium, extracardiac: There was no pericardial effusion.  Impressions:  - Mild LVH with LVEF 65-70%, grade 1 diastolic dysfunction. Moderate to severe aortic stenosis as outlined with bicuspid aortic valve and mild aortic regurgitation. There is also at least moderate dilatation of the ascending aorta based on limited views. Given association of aortic aneurysmal disease with bicuspid aortic valve, would suggest further dedicated imaging of the  thoracic aorta with CTA or MRA. Normal PASP 29 mmHg.   05/2014 CTA chest IMPRESSION: Maximal diameter of the ascending aorta is 4.9 cm compare with 4.8 cm on the prior study. No evidence of dissection.  There are airspace and ground-glass opacities in the left upper lobe most consistent with an inflammatory process. There is also a 2 mm nodule. If the patient is at high risk for bronchogenic carcinoma, follow-up chest CT at 1 year is recommended. If the patient is at low risk, no follow-up is needed. This recommendation follows the consensus statement: Guidelines for Management of Small Pulmonary Nodules Detected on CT Scans: A Statement from the Fleischner Society as published in Radiology 2005; 237:395-400.   ADDENDUM: The patient therefore has a thoracic aortic aneurysm that has only increased from 4.8 cm to 4.9 cm since August of last year. Ascending thoracic aortic aneurysm. Recommend semi-annual imaging followup by CTA or MRA and referral to cardiothoracic surgery if not already obtained. This recommendation follows 2010 ACCF/AHA/AATS/ACR/ASA/SCA/SCAI/SIR/STS/SVM Guidelines for the Diagnosis and Management of Patients With Thoracic Aortic Disease. Circulation. 2010; 121: Z610-R604   06/2014 Echo Study Conclusions  -  Left ventricle: The cavity size was normal. Systolic function was vigorous. The estimated ejection fraction was in the range of 65% to 70%. Wall motion was normal; there were no regional wall motion abnormalities. Features are consistent with a pseudonormal left ventricular filling pattern, with concomitant abnormal relaxation and increased filling pressure (grade 2 diastolic dysfunction). Doppler parameters are consistent with high ventricular filling pressure. Mild to moderate concentric left ventricular hypertrophy. - Aortic valve: Mildly to moderately calcified annulus. Moderately thickened, severely calcified leaflets. Cusp separation  was reduced. There was moderate to severe stenosis. There was mild regurgitation. Peak velocity (S): 364 cm/s. Mean gradient: 32 mmHg. Valve area (Vmax): 0.91 cm^2. - Aorta: Ascending aortic diameter: 43 mm (S). Mild ascending aortic dilatation. - Mitral valve: Mildly to moderately calcified annulus. There was mild regurgitation.  Impressions:  - When compared to the report dated 10/02/13, the mean aortic valve gradient has increased by 11 mmHg. Moderate to severe stenosis is seen.  01/2015 CTA chest IMPRESSION: Dilatation of the thoracic aorta in its ascending component measuring 4.6 x 4.6 cm in greatest dimension. This is likely stable from the prior exam as some motion artifact was seen on the prior study.  09/2015 echo Study Conclusions  - Left ventricle: The cavity size was normal. Wall thickness was  increased in a pattern of moderate LVH. Systolic function was  normal. The estimated ejection fraction was in the range of 60%  to 65%. Wall motion was normal; there were no regional wall  motion abnormalities. Features are consistent with a pseudonormal  left ventricular filling pattern, with concomitant abnormal  relaxation and increased filling pressure (grade 2 diastolic  dysfunction). Doppler parameters are consistent with high  ventricular filling pressure. - Aortic valve: Moderately thickened, severely calcified leaflets.  There was moderate to severe stenosis. There was mild  regurgitation. Peak velocity (S): 390 cm/s. Mean gradient (S): 34  mm Hg. Valve area (VTI): 1.01 cm^2. Valve area (Vmax): 0.89 cm^2.  Valve area (Vmean): 0.93 cm^2. - Aorta: Ascending aortic diameter: 46 mm (S). - Ascending aorta: The ascending aorta was mild to moderately  dilated. - Mitral valve: Calcified annulus. There was mild regurgitation. - Left atrium: The atrium was moderately dilated. - Right ventricle: Systolic function was mildly to moderately   reduced. - Tricuspid valve: There was mild-moderate regurgitation.  Assessment and Plan   1. Bicuspid aortic valve with aortic stenosis and aortic aneurysm - recent echo moderate to severe, she is starting to develop exertional symptoms - we will plan for LHC/RHC/AV study. She has f/u with CT surgery this week. She is likely nearing the time for potential intervention on valve and aorta. Defer any additional aorta imaging to CT surgery  2. HL - side effects on statins, zetia too expensive - we will continue diet modification  3. HTN - at goal, she will continue currnet meds  4. Bradycardia - EKG in clinis shows sinus brady at 47.  - asymptomatic. On beta blocker due to aneurysm, we will decrease atenolol to 12.5mg  daily   F/u 6 months. We have provided a note regarding jury duty. She is not able to walk the neccesary 3 blocks, if different arrangements can be made as far as parking she may still participate.    Antoine PocheJonathan F. Normalee Sistare, M.D

## 2015-09-17 ENCOUNTER — Telehealth: Payer: Self-pay | Admitting: *Deleted

## 2015-09-17 ENCOUNTER — Encounter: Payer: Self-pay | Admitting: *Deleted

## 2015-09-17 NOTE — Telephone Encounter (Signed)
-----   Message from Angela PocheJonathan F Branch, MD sent at 09/16/2015  4:14 PM EDT ----- This patient needs a LHC/RHC and aortic valve study for severe aortic stenosis  Dominga FerryJ Branch MD

## 2015-09-17 NOTE — Telephone Encounter (Signed)
Pt scheduled for 09/24/15 @ 9AM Dr. Eldridge DaceVaranasi. Pt aware with verbal and mailed instructions

## 2015-09-24 ENCOUNTER — Encounter (HOSPITAL_COMMUNITY)
Admission: RE | Disposition: A | Discharge status: Home / Self Care | Payer: Self-pay | Source: Ambulatory Visit | Attending: Interventional Cardiology

## 2015-09-24 ENCOUNTER — Ambulatory Visit (HOSPITAL_COMMUNITY)
Admission: RE | Admit: 2015-09-24 | Discharge: 2015-09-24 | Disposition: A | Discharge status: Home / Self Care | Payer: Medicare Other | Source: Ambulatory Visit | Attending: Interventional Cardiology | Admitting: Interventional Cardiology

## 2015-09-24 ENCOUNTER — Encounter (HOSPITAL_COMMUNITY): Payer: Self-pay | Admitting: Interventional Cardiology

## 2015-09-24 DIAGNOSIS — Z87891 Personal history of nicotine dependence: Secondary | ICD-10-CM | POA: Insufficient documentation

## 2015-09-24 DIAGNOSIS — I082 Rheumatic disorders of both aortic and tricuspid valves: Secondary | ICD-10-CM | POA: Insufficient documentation

## 2015-09-24 DIAGNOSIS — R001 Bradycardia, unspecified: Secondary | ICD-10-CM | POA: Insufficient documentation

## 2015-09-24 DIAGNOSIS — I712 Thoracic aortic aneurysm, without rupture: Secondary | ICD-10-CM | POA: Diagnosis not present

## 2015-09-24 DIAGNOSIS — I350 Nonrheumatic aortic (valve) stenosis: Secondary | ICD-10-CM

## 2015-09-24 DIAGNOSIS — Z7982 Long term (current) use of aspirin: Secondary | ICD-10-CM | POA: Insufficient documentation

## 2015-09-24 DIAGNOSIS — Z880 Allergy status to penicillin: Secondary | ICD-10-CM | POA: Diagnosis not present

## 2015-09-24 DIAGNOSIS — I2510 Atherosclerotic heart disease of native coronary artery without angina pectoris: Secondary | ICD-10-CM | POA: Insufficient documentation

## 2015-09-24 DIAGNOSIS — I10 Essential (primary) hypertension: Secondary | ICD-10-CM | POA: Insufficient documentation

## 2015-09-24 HISTORY — PX: CARDIAC CATHETERIZATION: SHX172

## 2015-09-24 LAB — POCT I-STAT 3, VENOUS BLOOD GAS (G3P V)
ACID-BASE EXCESS: 2 mmol/L (ref 0.0–2.0)
BICARBONATE: 28.5 meq/L — AB (ref 20.0–24.0)
O2 SAT: 68 %
PCO2 VEN: 51.2 mmHg — AB (ref 45.0–50.0)
PO2 VEN: 38 mmHg (ref 31.0–45.0)
TCO2: 30 mmol/L (ref 0–100)
pH, Ven: 7.354 — ABNORMAL HIGH (ref 7.250–7.300)

## 2015-09-24 LAB — PROTIME-INR
INR: 1.1 (ref 0.00–1.49)
Prothrombin Time: 14.4 seconds (ref 11.6–15.2)

## 2015-09-24 LAB — BASIC METABOLIC PANEL
ANION GAP: 9 (ref 5–15)
BUN: 16 mg/dL (ref 6–20)
CHLORIDE: 102 mmol/L (ref 101–111)
CO2: 28 mmol/L (ref 22–32)
Calcium: 8.7 mg/dL — ABNORMAL LOW (ref 8.9–10.3)
Creatinine, Ser: 0.91 mg/dL (ref 0.44–1.00)
GFR calc non Af Amer: >60 mL/min (ref >60)
Glucose, Bld: 111 mg/dL — ABNORMAL HIGH (ref 65–99)
POTASSIUM: 3.1 mmol/L — AB (ref 3.5–5.1)
Sodium: 139 mmol/L (ref 135–145)

## 2015-09-24 LAB — CBC
HEMATOCRIT: 40.1 % (ref 36.0–46.0)
HEMOGLOBIN: 13.7 g/dL (ref 12.0–15.0)
MCH: 33.2 pg (ref 26.0–34.0)
MCHC: 34.2 g/dL (ref 30.0–36.0)
MCV: 97.1 fL (ref 78.0–100.0)
Platelets: 249 10*3/uL (ref 150–400)
RBC: 4.13 MIL/uL (ref 3.87–5.11)
RDW: 12.4 % (ref 11.5–15.5)
WBC: 6.4 10*3/uL (ref 4.0–10.5)

## 2015-09-24 LAB — POCT I-STAT 3, ART BLOOD GAS (G3+)
Acid-Base Excess: 1 mmol/L (ref 0.0–2.0)
BICARBONATE: 25.2 meq/L — AB (ref 20.0–24.0)
O2 Saturation: 98 %
PH ART: 7.456 — AB (ref 7.350–7.450)
PO2 ART: 91 mmHg (ref 80.0–100.0)
TCO2: 26 mmol/L (ref 0–100)
pCO2 arterial: 35.7 mmHg (ref 35.0–45.0)

## 2015-09-24 SURGERY — RIGHT/LEFT HEART CATH AND CORONARY ANGIOGRAPHY
Anesthesia: LOCAL

## 2015-09-24 MED ORDER — LIDOCAINE HCL (PF) 1 % IJ SOLN
INTRAMUSCULAR | Status: DC | PRN
  Administered 2015-09-24 (×2): 2 mL via SUBCUTANEOUS

## 2015-09-24 MED ORDER — SODIUM CHLORIDE 0.9% FLUSH: 3.0000 mL | INTRAVENOUS | Status: DC | PRN

## 2015-09-24 MED ORDER — FENTANYL CITRATE (PF) 100 MCG/2ML IJ SOLN
INTRAMUSCULAR | Status: DC | PRN
  Administered 2015-09-24: 25 ug via INTRAVENOUS

## 2015-09-24 MED ORDER — MIDAZOLAM HCL 2 MG/2ML IJ SOLN
INTRAMUSCULAR | Status: DC | PRN
  Administered 2015-09-24: 1 mg via INTRAVENOUS

## 2015-09-24 MED ORDER — SODIUM CHLORIDE 0.9 % WEIGHT BASED INFUSION
3.0000 mL/kg/h | INTRAVENOUS | Status: AC
  Administered 2015-09-24: 3 mL/kg/h via INTRAVENOUS

## 2015-09-24 MED ORDER — SODIUM CHLORIDE 0.9 % WEIGHT BASED INFUSION: 1.0000 mL/kg/h | INTRAVENOUS | Status: DC

## 2015-09-24 MED ORDER — IOPAMIDOL (ISOVUE-370) INJECTION 76%
INTRAVENOUS | Status: DC | PRN
  Administered 2015-09-24: 95 mL via INTRA_ARTERIAL

## 2015-09-24 MED ORDER — NITROGLYCERIN 1 MG/10 ML FOR IR/CATH LAB
INTRA_ARTERIAL | Status: AC
Start: 2015-09-24 — End: 2015-09-24
  Filled 2015-09-24: qty 10

## 2015-09-24 MED ORDER — HEPARIN (PORCINE) IN NACL 2-0.9 UNIT/ML-% IJ SOLN
INTRAMUSCULAR | Status: AC
  Filled 2015-09-24: qty 1000

## 2015-09-24 MED ORDER — ASPIRIN 81 MG PO CHEW: 81.0000 mg | CHEWABLE_TABLET | ORAL | Status: DC

## 2015-09-24 MED ORDER — FENTANYL CITRATE (PF) 100 MCG/2ML IJ SOLN
INTRAMUSCULAR | Status: AC
  Filled 2015-09-24: qty 2

## 2015-09-24 MED ORDER — HEPARIN SODIUM (PORCINE) 1000 UNIT/ML IJ SOLN
INTRAMUSCULAR | Status: AC
  Filled 2015-09-24: qty 1

## 2015-09-24 MED ORDER — MIDAZOLAM HCL 2 MG/2ML IJ SOLN
INTRAMUSCULAR | Status: AC
  Filled 2015-09-24: qty 2

## 2015-09-24 MED ORDER — SODIUM CHLORIDE 0.9% FLUSH: 3.0000 mL | Freq: Two times a day (BID) | INTRAVENOUS | Status: DC

## 2015-09-24 MED ORDER — HEPARIN (PORCINE) IN NACL 2-0.9 UNIT/ML-% IJ SOLN
INTRAMUSCULAR | Status: DC | PRN
  Administered 2015-09-24: 10 mL via INTRA_ARTERIAL

## 2015-09-24 MED ORDER — IOPAMIDOL (ISOVUE-370) INJECTION 76%
INTRAVENOUS | Status: AC
  Filled 2015-09-24: qty 100

## 2015-09-24 MED ORDER — SODIUM CHLORIDE 0.9 % IV SOLN: 250.0000 mL | INTRAVENOUS | Status: DC | PRN

## 2015-09-24 MED ORDER — VERAPAMIL HCL 2.5 MG/ML IV SOLN
INTRAVENOUS | Status: AC
  Filled 2015-09-24: qty 2

## 2015-09-24 MED ORDER — HEPARIN (PORCINE) IN NACL 2-0.9 UNIT/ML-% IJ SOLN
INTRAMUSCULAR | Status: DC | PRN
  Administered 2015-09-24: 1000 mL

## 2015-09-24 MED ORDER — HEPARIN SODIUM (PORCINE) 1000 UNIT/ML IJ SOLN
INTRAMUSCULAR | Status: DC | PRN
  Administered 2015-09-24: 5000 [IU] via INTRAVENOUS

## 2015-09-24 MED ORDER — LIDOCAINE HCL (PF) 1 % IJ SOLN
INTRAMUSCULAR | Status: AC
  Filled 2015-09-24: qty 30

## 2015-09-24 SURGICAL SUPPLY — 17 items
CATH BALLN WEDGE 5F 110CM (CATHETERS) ×2 IMPLANT
CATH INFINITI 4FR 145 PIGTAIL (CATHETERS) ×2 IMPLANT
CATH INFINITI 5 FR AR1 MOD (CATHETERS) ×2 IMPLANT
CATH INFINITI 5FR AL1 (CATHETERS) ×2 IMPLANT
CATH INFINITI 5FR JL4 (CATHETERS) ×2 IMPLANT
CATH INFINITI JR4 5F (CATHETERS) ×2 IMPLANT
DEVICE RAD COMP TR BAND LRG (VASCULAR PRODUCTS) ×2 IMPLANT
GLIDESHEATH SLEND SS 6F .021 (SHEATH) ×2 IMPLANT
KIT HEART LEFT (KITS) ×2 IMPLANT
PACK CARDIAC CATHETERIZATION (CUSTOM PROCEDURE TRAY) ×2 IMPLANT
SHEATH FAST CATH BRACH 5F 5CM (SHEATH) ×2 IMPLANT
SYR MEDRAD MARK V 150ML (SYRINGE) ×2 IMPLANT
TRANSDUCER W/STOPCOCK (MISCELLANEOUS) ×4 IMPLANT
TUBING ART PRESS 72  MALE/FEM (TUBING) ×1
TUBING ART PRESS 72 MALE/FEM (TUBING) ×1 IMPLANT
TUBING CIL FLEX 10 FLL-RA (TUBING) ×2 IMPLANT
WIRE SAFE-T 1.5MM-J .035X260CM (WIRE) ×2 IMPLANT

## 2015-09-24 NOTE — Progress Notes (Signed)
Site area: Right Brachial  Site Prior to Removal:  Level 0 Pressure Applied For 15  Minutes Beginning at 1015  Manual:  yes Patient Status During Pull:good  Post Pull BrachialiSite:  Level0}  Post Pull Instructions Given:Yes  Post Pull Pulses Present:  Yes  Dressing Applied:  yes  Comments:  Pt tolerated procedure well

## 2015-09-24 NOTE — H&P (View-Only) (Signed)
   Clinical Summary Angela Dawson is a 70 y.o.female  seen today for follow up of the following medical problems.   1. Aortic stenosis with bicuspid AV and ascending aortic aneurysm.  - 09/2013 echo showed LVEF 65-70%, grade I diastolic dysfunction. Bicuspid AV with moderate to severe AS (mean grad 21, dimensionless index 0.32, valve area 0.93), mild AI. Described mild aorta dilatation from limited views, ascending aorta 4.6 cm. - CTA chest 05/2014 with asscending thoracic aortic aneurysm 4.9 cm.  01/2015 CTA chest ascending aortic aneurysm 4.6 x 4.6 cm. Forllowed by Dr Gerhardt.  -06/2014 echo LVEF 65-70%, AVA area 0.91, mean grad 32 - 09/2015 echo LVEF 60-65%, mod to severe AS mean grad 34, AVA 1   - notes some increased fatigue with activities since last visit. Example walking up hill from her mailbox, symptoms  progressing over the last few months. No chest pains. No syncope. Occasional LE edema.   2. HL - not able to tolerate statins, most recently tried pravastatin.  - 02/2015 TC 232 TG 120 HDL 51 LDL 157 - did not start zetia, cost $300  3. 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 Hypertension      Allergies  Allergen Reactions  . Crestor [Rosuvastatin]     Dizziness  . Lipitor [Atorvastatin]     Unable to stand or move  . Statins     Leg cramps   . Penicillins Rash     Current Outpatient Prescriptions  Medication Sig Dispense Refill  . aspirin 81 MG tablet Take 81 mg by mouth daily.    . atenolol (TENORMIN) 25 MG tablet Take 1 tablet (25 mg total) by mouth daily. 90 tablet 1  . hydrochlorothiazide (HYDRODIURIL) 25 MG tablet Take 1 tablet (25 mg total) by mouth daily. 90 tablet 3  . ibuprofen (ADVIL,MOTRIN) 200 MG tablet Take 200 mg by mouth every 6 (six) hours as needed.    . lisinopril (PRINIVIL,ZESTRIL) 20 MG tablet Take 1 tablet (20 mg  total) by mouth daily. 90 tablet 3   No current facility-administered medications for this visit.     Past Surgical History  Procedure Laterality Date  . Tubal ligation       Allergies  Allergen Reactions  . Crestor [Rosuvastatin]     Dizziness  . Lipitor [Atorvastatin]     Unable to stand or move  . Statins     Leg cramps   . Penicillins Rash      Family History  Problem Relation Age of Onset  . Alzheimer's disease Mother   . Heart attack Father      Social History Ms. Haris reports that she quit smoking about 41 years ago. Her smoking use included Cigarettes. She started smoking about 47 years ago. She has a 2.5 pack-year smoking history. She has never used smokeless tobacco. Ms. Tiner reports that she does not drink alcohol.   Review of Systems CONSTITUTIONAL: No weight loss, fever, chills, weakness or fatigue.  HEENT: Eyes: No visual loss, blurred vision, double vision or yellow sclerae.No hearing loss, sneezing, congestion, runny nose or sore throat.  SKIN: No rash or itching.  CARDIOVASCULAR: per HPI RESPIRATORY: No shortness of breath, cough or sputum.  GASTROINTESTINAL: No anorexia, nausea, vomiting or diarrhea. No abdominal pain or blood.  GENITOURINARY: No burning on urination, no polyuria NEUROLOGICAL: No headache,   dizziness, syncope, paralysis, ataxia, numbness or tingling in the extremities. No change in bowel or bladder control.  MUSCULOSKELETAL: No muscle, back pain, joint pain or stiffness.  LYMPHATICS: No enlarged nodes. No history of splenectomy.  PSYCHIATRIC: No history of depression or anxiety.  ENDOCRINOLOGIC: No reports of sweating, cold or heat intolerance. No polyuria or polydipsia.  .   Physical Examination Filed Vitals:   09/16/15 0952  BP: 131/80  Pulse: 45   Filed Vitals:   09/16/15 0952  Height: 5' (1.524 m)  Weight: 217 lb 3.2 oz (98.521 kg)    Gen: resting comfortably, no acute distress HEENT: no scleral icterus,  pupils equal round and reactive, no palptable cervical adenopathy,  CV: RRR, no m/r/g, no jvd Resp: Clear to auscultation bilaterally GI: abdomen is soft, non-tender, non-distended, normal bowel sounds, no hepatosplenomegaly MSK: extremities are warm, no edema.  Skin: warm, no rash Neuro:  no focal deficits Psych: appropriate affect   Diagnostic Studies 09/2013 Echo Study Conclusions  - Left ventricle: The cavity size was normal. Wall thickness was increased in a pattern of mild LVH. Systolic function was vigorous. The estimated ejection fraction was in the range of 65% to 70%. Wall motion was normal; there were no regional wall motion abnormalities. Doppler parameters are consistent with abnormal left ventricular relaxation (grade 1 diastolic dysfunction). - Aortic valve: Bicuspid; moderately calcified leaflets. Cusp separation was reduced. There was moderate to severe stenosis. There was mild regurgitation. Mean gradient (S): 21 mm Hg. LVOT/AV VTI ratio 0.32. Peak gradient (S): 40 mm Hg. Valve area (VTI): 0.93 cm^2. Valve area (Vmax): 0.96 cm^2. - Ascending aorta: The ascending aorta was moderately dilated. Approximately 46 mm. - Mitral valve: Calcified annulus. There was trivial regurgitation. - Right atrium: Central venous pressure (est): 3 mm Hg. - Atrial septum: No defect or patent foramen ovale was identified. - Tricuspid valve: There was trivial regurgitation. - Pulmonary arteries: PA peak pressure: 29 mm Hg (S). - Pericardium, extracardiac: There was no pericardial effusion.  Impressions:  - Mild LVH with LVEF 65-70%, grade 1 diastolic dysfunction. Moderate to severe aortic stenosis as outlined with bicuspid aortic valve and mild aortic regurgitation. There is also at least moderate dilatation of the ascending aorta based on limited views. Given association of aortic aneurysmal disease with bicuspid aortic valve, would suggest further dedicated imaging of the  thoracic aorta with CTA or MRA. Normal PASP 29 mmHg.   05/2014 CTA chest IMPRESSION: Maximal diameter of the ascending aorta is 4.9 cm compare with 4.8 cm on the prior study. No evidence of dissection.  There are airspace and ground-glass opacities in the left upper lobe most consistent with an inflammatory process. There is also a 2 mm nodule. If the patient is at high risk for bronchogenic carcinoma, follow-up chest CT at 1 year is recommended. If the patient is at low risk, no follow-up is needed. This recommendation follows the consensus statement: Guidelines for Management of Small Pulmonary Nodules Detected on CT Scans: A Statement from the Fleischner Society as published in Radiology 2005; 237:395-400.   ADDENDUM: The patient therefore has a thoracic aortic aneurysm that has only increased from 4.8 cm to 4.9 cm since August of last year. Ascending thoracic aortic aneurysm. Recommend semi-annual imaging followup by CTA or MRA and referral to cardiothoracic surgery if not already obtained. This recommendation follows 2010 ACCF/AHA/AATS/ACR/ASA/SCA/SCAI/SIR/STS/SVM Guidelines for the Diagnosis and Management of Patients With Thoracic Aortic Disease. Circulation. 2010; 121: e266-e369   06/2014 Echo Study Conclusions  -   Left ventricle: The cavity size was normal. Systolic function was vigorous. The estimated ejection fraction was in the range of 65% to 70%. Wall motion was normal; there were no regional wall motion abnormalities. Features are consistent with a pseudonormal left ventricular filling pattern, with concomitant abnormal relaxation and increased filling pressure (grade 2 diastolic dysfunction). Doppler parameters are consistent with high ventricular filling pressure. Mild to moderate concentric left ventricular hypertrophy. - Aortic valve: Mildly to moderately calcified annulus. Moderately thickened, severely calcified leaflets. Cusp separation  was reduced. There was moderate to severe stenosis. There was mild regurgitation. Peak velocity (S): 364 cm/s. Mean gradient: 32 mmHg. Valve area (Vmax): 0.91 cm^2. - Aorta: Ascending aortic diameter: 43 mm (S). Mild ascending aortic dilatation. - Mitral valve: Mildly to moderately calcified annulus. There was mild regurgitation.  Impressions:  - When compared to the report dated 10/02/13, the mean aortic valve gradient has increased by 11 mmHg. Moderate to severe stenosis is seen.  01/2015 CTA chest IMPRESSION: Dilatation of the thoracic aorta in its ascending component measuring 4.6 x 4.6 cm in greatest dimension. This is likely stable from the prior exam as some motion artifact was seen on the prior study.  09/2015 echo Study Conclusions  - Left ventricle: The cavity size was normal. Wall thickness was  increased in a pattern of moderate LVH. Systolic function was  normal. The estimated ejection fraction was in the range of 60%  to 65%. Wall motion was normal; there were no regional wall  motion abnormalities. Features are consistent with a pseudonormal  left ventricular filling pattern, with concomitant abnormal  relaxation and increased filling pressure (grade 2 diastolic  dysfunction). Doppler parameters are consistent with high  ventricular filling pressure. - Aortic valve: Moderately thickened, severely calcified leaflets.  There was moderate to severe stenosis. There was mild  regurgitation. Peak velocity (S): 390 cm/s. Mean gradient (S): 34  mm Hg. Valve area (VTI): 1.01 cm^2. Valve area (Vmax): 0.89 cm^2.  Valve area (Vmean): 0.93 cm^2. - Aorta: Ascending aortic diameter: 46 mm (S). - Ascending aorta: The ascending aorta was mild to moderately  dilated. - Mitral valve: Calcified annulus. There was mild regurgitation. - Left atrium: The atrium was moderately dilated. - Right ventricle: Systolic function was mildly to moderately   reduced. - Tricuspid valve: There was mild-moderate regurgitation.  Assessment and Plan   1. Bicuspid aortic valve with aortic stenosis and aortic aneurysm - recent echo moderate to severe, she is starting to develop exertional symptoms - we will plan for LHC/RHC/AV study. She has f/u with CT surgery this week. She is likely nearing the time for potential intervention on valve and aorta. Defer any additional aorta imaging to CT surgery  2. HL - side effects on statins, zetia too expensive - we will continue diet modification  3. HTN - at goal, she will continue currnet meds  4. Bradycardia - EKG in clinis shows sinus brady at 47.  - asymptomatic. On beta blocker due to aneurysm, we will decrease atenolol to 12.5mg daily   F/u 6 months. We have provided a note regarding jury duty. She is not able to walk the neccesary 3 blocks, if different arrangements can be made as far as parking she may still participate.    Tahmid Stonehocker F. Onica Davidovich, M.D  

## 2015-09-24 NOTE — Discharge Instructions (Signed)
Radial Site Care °Refer to this sheet in the next few weeks. These instructions provide you with information about caring for yourself after your procedure. Your health care provider may also give you more specific instructions. Your treatment has been planned according to current medical practices, but problems sometimes occur. Call your health care provider if you have any problems or questions after your procedure. °WHAT TO EXPECT AFTER THE PROCEDURE °After your procedure, it is typical to have the following: °· Bruising at the radial site that usually fades within 1-2 weeks. °· Blood collecting in the tissue (hematoma) that may be painful to the touch. It should usually decrease in size and tenderness within 1-2 weeks. °HOME CARE INSTRUCTIONS °· Take medicines only as directed by your health care provider. °· You may shower 24-48 hours after the procedure or as directed by your health care provider. Remove the bandage (dressing) and gently wash the site with plain soap and water. Pat the area dry with a clean towel. Do not rub the site, because this may cause bleeding. °· Do not take baths, swim, or use a hot tub until your health care provider approves. °· Check your insertion site every day for redness, swelling, or drainage. °· Do not apply powder or lotion to the site. °· Do not flex or bend the affected arm for 24 hours or as directed by your health care provider. °· Do not push or pull heavy objects with the affected arm for 24 hours or as directed by your health care provider. °· Do not lift over 10 lb (4.5 kg) for 5 days after your procedure or as directed by your health care provider. °· Ask your health care provider when it is okay to: °¨ Return to work or school. °¨ Resume usual physical activities or sports. °¨ Resume sexual activity. °· Do not drive home if you are discharged the same day as the procedure. Have someone else drive you. °· You may drive 24 hours after the procedure unless otherwise  instructed by your health care provider. °· Do not operate machinery or power tools for 24 hours after the procedure. °· If your procedure was done as an outpatient procedure, which means that you went home the same day as your procedure, a responsible adult should be with you for the first 24 hours after you arrive home. °· Keep all follow-up visits as directed by your health care provider. This is important. °SEEK MEDICAL CARE IF: °· You have a fever. °· You have chills. °· You have increased bleeding from the radial site. Hold pressure on the site. °SEEK IMMEDIATE MEDICAL CARE IF: °· You have unusual pain at the radial site. °· You have redness, warmth, or swelling at the radial site. °· You have drainage (other than a small amount of blood on the dressing) from the radial site. °· The radial site is bleeding, and the bleeding does not stop after 30 minutes of holding steady pressure on the site. °· Your arm or hand becomes pale, cool, tingly, or numb. °  °This information is not intended to replace advice given to you by your health care provider. Make sure you discuss any questions you have with your health care provider. °  °Document Released: 03/25/2010 Document Revised: 03/13/2014 Document Reviewed: 09/08/2013 °[employer] Interactive Patient Education ©2016 [employer] ° °

## 2015-09-24 NOTE — Interval H&P Note (Signed)
Cath Lab Visit (complete for each Cath Lab visit)  Clinical Evaluation Leading to the Procedure:   ACS: No.  Non-ACS:    Anginal Classification: CCS III  Anti-ischemic medical therapy: Minimal Therapy (1 class of medications)  Non-Invasive Test Results: No non-invasive testing performed  Prior CABG: No previous CABG      History and Physical Interval Note:  09/24/2015 9:00 AM  Angela Dawson  has presented today for surgery, with the diagnosis of aortic stenosis  The various methods of treatment have been discussed with the patient and family. After consideration of risks, benefits and other options for treatment, the patient has consented to  Procedure(s): Right/Left Heart Cath and Coronary Angiography (N/A) as a surgical intervention .  The patient's history has been reviewed, patient examined, no change in status, stable for surgery.  I have reviewed the patient's chart and labs.  Questions were answered to the patient's satisfaction.     Lance MussJayadeep Fatima Fedie

## 2015-09-27 MED FILL — Nitroglycerin IV Soln 100 MCG/ML in D5W: INTRA_ARTERIAL | Qty: 10 | Status: AC

## 2015-09-29 ENCOUNTER — Ambulatory Visit (INDEPENDENT_AMBULATORY_CARE_PROVIDER_SITE_OTHER): Payer: Medicare Other | Admitting: Cardiothoracic Surgery

## 2015-09-29 VITALS — BP 134/74 | Resp 20 | Ht 60.0 in | Wt 217.0 lb

## 2015-09-29 DIAGNOSIS — Q231 Congenital insufficiency of aortic valve: Secondary | ICD-10-CM | POA: Diagnosis not present

## 2015-09-29 DIAGNOSIS — I350 Nonrheumatic aortic (valve) stenosis: Secondary | ICD-10-CM

## 2015-09-29 DIAGNOSIS — Q230 Congenital stenosis of aortic valve: Secondary | ICD-10-CM

## 2015-09-29 DIAGNOSIS — I77810 Thoracic aortic ectasia: Secondary | ICD-10-CM

## 2015-09-29 NOTE — Progress Notes (Signed)
[address]       [place] [address]             [phone]                    [facility] Medical Record #[id number] Date of Birth: 08/28/1945  Referring: Antoine Poche, MD Primary Care: Bennie Pierini, FNP  Chief Complaint:    Chief Complaint  Patient presents with  . Aortic Stenosis    Further discuss surgery, Cardiac Cath 09/24/15  . Thoracic Aortic Aneurysm    History of Present Illness:    Angela Dawson 70 y.o. female is seen in the office 8 months ago for evaluation   moderately dilated ascending aorta. She has also noted to have moderate  aortic stenosis  of a bicuspid aortic valve. The patient Was asymptomatic from a cardiac standpoint. 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 She denies  PND, pedal edema, syncope, presyncope..   Other than hypertension the patient has no previous history of cardiac disease  She does have a family history of cardiac disease her father died of myocardial infarction age 74   No family history of aortic dissection  Current Activity/ Functional Status:  Patient is independent with mobility/ambulation, transfers, ADL's, IADL's.   Zubrod Score: At the time of surgery this patient's most appropriate activity status/level should be described as: []     0    Normal activity, no symptoms [x]     1    Restricted in physical strenuous activity but ambulatory, able to do out light work []     2    Ambulatory and capable of self care, unable to do work activities, up and about               >50 % of waking hours                              []     3    Only limited self care, in bed greater than 50% of waking hours []     4    Completely disabled, no self care, confined to bed or chair []     5    Moribund   Past Medical History:    Diagnosis Date  . Hypertension     Past Surgical History:  Procedure Laterality Date  . CARDIAC CATHETERIZATION N/A 09/24/2015   Procedure: Right/Left Heart Cath and Coronary Angiography;  Surgeon: Corky Crafts, MD;  Location: [facility] INVASIVE CV LAB;  Service: Cardiovascular;  Laterality: N/A;  . TUBAL LIGATION      Family History  Problem Relation Age of Onset  . Alzheimer's disease Mother   . Heart attack Father       History  Smoking Status  . Former Smoker  . Packs/day: 0.50  . Years: 5.00  . Types: Cigarettes  . Start date: 08/04/1968  . Quit date: 10/10/1973  Smokeless Tobacco  . Never Used    Comment: smoked less than 1 pack every 2 weeks. Havent smoked in atleast 40 years    History  Alcohol Use No     Allergies  Allergen Reactions  . Crestor [Rosuvastatin]     Dizziness  . Lipitor [Atorvastatin]  Unable to stand or move  . Statins     Leg cramps   . Penicillins Rash    Has patient had a PCN reaction causing immediate rash, facial/tongue/throat swelling, SOB or lightheadedness with hypotension: Yes Has patient had a PCN reaction causing severe rash involving mucus membranes or skin necrosis: No Has patient had a PCN reaction that required hospitalization No Has patient had a PCN reaction occurring within the last 10 years: No If all of the above answers are "NO", then may proceed with Cephalosporin use.     Current Outpatient Prescriptions  Medication Sig Dispense Refill  . aspirin 81 MG tablet Take 81 mg by mouth daily.    Marland Kitchen atenolol (TENORMIN) 25 MG tablet Take 0.5 tablets (12.5 mg total) by mouth daily. 45 tablet 3  . hydrochlorothiazide (HYDRODIURIL) 25 MG tablet Take 1 tablet (25 mg total) by mouth daily. 90 tablet 3  . ibuprofen (ADVIL,MOTRIN) 200 MG tablet Take 400 mg by mouth every 12 (twelve) hours.     Marland Kitchen lisinopril (PRINIVIL,ZESTRIL) 20 MG tablet Take 1 tablet (20 mg total) by mouth daily. 90 tablet 3   No current facility-administered  medications for this visit.      Review of Systems:     Cardiac Review of Systems: Y or N  Chest Pain [ n   ]  Resting SOB [n   ] Exertional SOB  [ n ]  Orthopnea [n  ]   Pedal Edema [ n  ]    Palpitations [n  ] Syncope  Milo.Brash  ]   Presyncope [ n  ]  General Review of Systems: [Y] = yes [  ]=no Constitional: recent weight change [  ];  Wt loss over the last 3 months [  n ] anorexia [  ]; fatigue [n  ]; nausea [  ]; night sweats [  ]; fever [  ]; or chills [  ];          Dental: poor dentition[  ]; Last Dentist visit:   Eye : blurred vision [ n ]; diplopia [  n ]; vision changes [ n ];  Amaurosis fugax[n  ]; Resp: cough [  ];  wheezing[n  ];  hemoptysis[n  ]; shortness of breath[ n ]; paroxysmal nocturnal dyspnea[  ]; dyspnea on exertion[  ]; or orthopnea[  ];  GI:  gallstones[  ], vomiting[  ];  dysphagia[  ]; melena[  ];  hematochezia [  ]; heartburn[  ];   Hx of  Colonoscopy[  ]; GU: kidney stones [  ]; hematuria[  ];   dysuria [  ];  nocturia[  ];  history of     obstruction [  ]; urinary frequency [  ]             Skin: rash, swelling[  ];, hair loss[  ];  peripheral edema[  ];  or itching[  ]; Musculosketetal: myalgias[  ];  joint swelling[  ];  joint erythema[  ];  joint pain[  ];  back pain[  ];  Heme/Lymph: bruising[  ];  bleeding[  ];  anemia[  ];  Neuro: TIA[n  ];  headaches[  ];  stroke[  ];  vertigo[  ];  seizures[  ];   paresthesias[ n ];  difficulty walking[n  ];  Psych:depression[ n ]; anxiety[  ];  Endocrine: diabetes[  ];  thyroid dysfunction[  ];  Immunizations: Flu up to date Cove.Etienne  ]; Pneumococcal up to  date Cove.Etienne  ];  Other:  Physical Exam: BP 134/74 (BP Location: Left Arm, Patient Position: Sitting, Cuff Size: Large)   Resp 20   Ht 5' (1.524 m)   Wt 217 lb (98.4 kg)   SpO2 97% Comment: RA  BMI 42.38 kg/m   PHYSICAL EXAMINATION:  General appearance: alert, cooperative, appears stated age and no distress Neurologic: intact Heart: systolic murmur: early systolic 3/6,  crescendo at 2nd right intercostal space Lungs: clear to auscultation bilaterally Abdomen: soft, non-tender; bowel sounds normal; no masses,  no organomegaly Extremities: extremities normal, atraumatic, no cyanosis or edema and Homans sign is negative, no sign of DVT Patient has no carotid bruits, has full and equal brachial radial and pedal pulses Has no cervical or supraclavicular adenopathy  Diagnostic Studies & Laboratory data:     Recent Radiology Findings:  Ct Angio Chest Aorta W/cm &/or Wo/cm  01/26/2015  CLINICAL DATA:  Followup thoracic ascending aorta aneurysm EXAM: CT ANGIOGRAPHY CHEST WITH CONTRAST TECHNIQUE: Multidetector CT imaging of the chest was performed using the standard protocol during bolus administration of intravenous contrast. Multiplanar CT image reconstructions and MIPs were obtained to evaluate the vascular anatomy. CONTRAST:  75 mL Isovue 370 COMPARISON:  06/04/2014 FINDINGS: The lungs are well aerated bilaterally without focal infiltrate, effusion or sizable parenchymal nodule. The thoracic inlet shows no acute abnormality. The thoracic aorta again demonstrates dilatation of the ascending portion. At the level of the main pulmonary artery it measures approximately 4.6 x 4.6 cm. It tapers in the arch and the descending thoracic aorta is within normal limits. No dissection is seen. Heavy calcification of the aortic arch is noted The pulmonary artery as visualized shows no large central pulmonary emboli. No hilar or mediastinal adenopathy is seen. Mild coronary calcifications are seen. Scanning into the upper abdomen reveals no acute abnormality. The osseous structures show no acute abnormality. Degenerative changes of the thoracic spine are seen. Review of the MIP images confirms the above findings. IMPRESSION: Dilatation of the thoracic aorta in its ascending component measuring 4.6 x 4.6 cm in greatest dimension. This is likely stable from the prior exam as some motion  artifact was seen on the prior study. No other focal abnormality is seen. Electronically Signed   By: Alcide Clever [name]   On: 01/26/2015 14:10   Ct Angio Chest Aorta W/cm &/or Wo/cm   06/04/2014   CLINICAL DATA:  Aortic aneurysm  EXAM: CT ANGIOGRAPHY CHEST WITH CONTRAST  TECHNIQUE: Multidetector CT imaging of the chest was performed using the standard protocol during bolus administration of intravenous contrast. Multiplanar CT image reconstructions and MIPs were obtained to evaluate the vascular anatomy.  CONTRAST:  75 cc Isovue 370  COMPARISON:  10/21/2013  FINDINGS: Maximal aortic diameters at the sinus of Valsalva, sino-tubular junction, and ascending aorta are 3.6 cm, 3.2 cm, and 4.9 cm respectively. Previous maximal diameter was 4.8 cm.  No evidence of aortic dissection or intramural hematoma. Moderate aortic valvular calcifications are noted.  Great vessels are patent within the confines of the study. Bilateral vertebral arteries are also patent.  No obvious evidence of acute pulmonary thromboembolism.  Minimal LAD territory coronary artery calcification. Trace left main coronary artery calcification.  No abnormal mediastinal adenopathy. Small mediastinal and bilateral axillary nodes.  Patchy ground-glass and airspace opacities in the lingula. There is a 2 mm nodule in the lingula on image 25.  No vertebral compression deformity.  Review of the MIP images confirms the above findings.  IMPRESSION: Maximal diameter  of the ascending aorta is 4.9 cm compare with 4.8 cm on the prior study. No evidence of dissection.  There are airspace and ground-glass opacities in the left upper lobe most consistent with an inflammatory process. There is also a 2 mm nodule. If the patient is at high risk for bronchogenic carcinoma, follow-up chest CT at 1 year is recommended. If the patient is at low risk, no follow-up is needed. This recommendation follows the consensus statement: Guidelines for Management of Small Pulmonary  Nodules Detected on CT Scans: A Statement from the Fleischner Society as published in Radiology 2005; 237:395-400.   Electronically Signed   By: Jolaine Click [name]   On: 06/04/2014 14:57    EXAM:  CT ANGIOGRAPHY CHEST WITH CONTRAST  TECHNIQUE:  Multidetector CT imaging of the chest was performed using the  standard protocol during bolus administration of intravenous  contrast. Multiplanar CT image reconstructions and MIPs were  obtained to evaluate the vascular anatomy.  CONTRAST: OMNIPAQUE IOHEXOL 350 MG/ML SOLN IV  COMPARISON: None  FINDINGS:  Fusiform aneurysmal dilatation ascending thoracic aorta 4.8 x 4.6 cm  image 35.  Aortic arch and descending thoracic aorta normal caliber.  Mild scattered atherosclerotic plaque formation thoracic aorta.  No evidence of aortic dissection or saccular aneurysm.  Plaque identified at origin of the celiac artery.  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  Pulmonary arteries well opacified and patent.  Visualized upper abdomen otherwise unremarkable.  No thoracic adenopathy.  Lungs clear.  No pleural effusion or pneumothorax.  Osseous structures unremarkable.  Review of the MIP images confirms the above findings.  IMPRESSION:  Fusiform aneurysmal dilatation ascending thoracic aorta measuring a  maximum of 4.8 x 4.6 cm.  Minimal scattered atherosclerotic calcifications.  Electronically Signed  By: Ulyses Southward [name]  On: 10/21/2013 09:50  No cardiac cath  Recent Lab Findings: Lab Results  Component Value Date   WBC 6.4 09/24/2015   HGB 13.7 09/24/2015   HCT 40.1 09/24/2015   PLT 249 09/24/2015   GLUCOSE 111 (H) 09/24/2015   CHOL 218 (H) 08/30/2015   TRIG 128 08/30/2015   HDL 49 08/30/2015   LDLCALC 143 (H) 08/30/2015   ALT 15 08/30/2015   AST 15 08/30/2015   NA 139 09/24/2015   K 3.1 (L) 09/24/2015   CL 102 09/24/2015   CREATININE 0.91 09/24/2015   BUN 16 09/24/2015   CO2 28  09/24/2015   INR 1.10 09/24/2015    ECHO7/2017 Study Conclusions  - Left ventricle: The cavity size was normal. Wall thickness was  increased in a pattern of moderate LVH. Systolic function was  normal. The estimated ejection fraction was in the range of 60%  to 65%. Wall motion was normal; there were no regional wall  motion abnormalities. Features are consistent with a pseudonormal  left ventricular filling pattern, with concomitant abnormal  relaxation and increased filling pressure (grade 2 diastolic  dysfunction). Doppler parameters are consistent with high  ventricular filling pressure. - Aortic valve: Moderately thickened, severely calcified leaflets.  There was moderate to severe stenosis. There was mild  regurgitation. Peak velocity (S): 390 cm/s. Mean gradient (S): 34  mm Hg. Valve area (VTI): 1.01 cm^2. Valve area (Vmax): 0.89 cm^2.  Valve area (Vmean): 0.93 cm^2. - Aorta: Ascending aortic diameter: 46 mm (S). - Ascending aorta: The ascending aorta was mild to moderately  dilated. - Mitral valve: Calcified annulus. There was mild regurgitation. - Left atrium:  The atrium was moderately dilated. - Right ventricle: Systolic function was mildly to moderately  reduced. - Tricuspid valve: There was mild-moderate regurgitation.  Transthoracic echocardiography. M-mode, complete 2D, spectral Doppler, and color Doppler. Birthdate: Patient birthdate: [birth year]. Age: Patient is 70 yr old. Sex: Gender: female. BMI: 42.2 kg/m^2. Blood pressure: 126/76 Patient status: Inpatient. Study date: Study date: 09/08/2015. Study time: 10:33 AM.  -------------------------------------------------------------------  ------------------------------------------------------------------- Left ventricle: The cavity size was normal. Wall thickness was increased in a pattern of moderate LVH. Systolic function was normal. The estimated ejection fraction was in the  range of 60% to 65%. Wall motion was normal; there were no regional wall motion abnormalities. Features are consistent with a pseudonormal left ventricular filling pattern, with concomitant abnormal relaxation and increased filling pressure (grade 2 diastolic dysfunction). Doppler parameters are consistent with high ventricular filling pressure.  ------------------------------------------------------------------- Aortic valve: Moderately thickened, severely calcified leaflets. Doppler: There was moderate to severe stenosis. There was mild regurgitation. VTI ratio of LVOT to aortic valve: 0.32. Valve area (VTI): 1.01 cm^2. Indexed valve area (VTI): 0.48 cm^2/m^2. Peak velocity ratio of LVOT to aortic valve: 0.28. Valve area (Vmax): 0.89 cm^2. Indexed valve area (Vmax): 0.42 cm^2/m^2. Mean velocity ratio of LVOT to aortic valve: 0.3. Valve area (Vmean): 0.93 cm^2. Indexed valve area (Vmean): 0.44 cm^2/m^2. Mean gradient (S): 34 mm Hg. Peak gradient (S): 61 mm Hg.  ------------------------------------------------------------------- Aorta: Aortic root: The aortic root was normal in size. Ascending aorta: The ascending aorta was mild to moderately dilated.  ------------------------------------------------------------------- Mitral valve: Calcified annulus. Doppler: There was mild regurgitation. Peak gradient (D): 5 mm Hg.  ------------------------------------------------------------------- Left atrium: The atrium was moderately dilated.  ------------------------------------------------------------------- Atrial septum: No defect or patent foramen ovale was identified.  ------------------------------------------------------------------- Right ventricle: The cavity size was normal. Systolic function was mildly to moderately reduced.  ------------------------------------------------------------------- Pulmonic valve: The valve appears to be grossly  normal. Doppler: There was trivial regurgitation.  ------------------------------------------------------------------- Tricuspid valve: The valve appears to be grossly normal. Doppler: There was mild-moderate regurgitation.  ------------------------------------------------------------------- Right atrium: The atrium was normal in size.  ------------------------------------------------------------------- Pericardium: There was no pericardial effusion.  ------------------------------------------------------------------- Systemic veins: Inferior vena cava: The vessel was normal in size. The respirophasic diameter changes were in the normal range (>= 50%), consistent with normal central venous pressure.  ------------------------------------------------------------------- Measurements  Left ventricle Value Reference LV ID, ED, PLAX chordal (L) 38.4 mm 43 - 52 LV ID, ES, PLAX chordal 24.8 mm 23 - 38 LV fx shortening, PLAX chordal 35 % >=29 LV PW thickness, ED 13.7 mm --------- IVS/LV PW ratio, ED 1.03 <=1.3 Stroke volume, 2D 100 ml --------- Stroke volume/bsa, 2D 48 ml/m^2 --------- LV ejection fraction, 1-p A4C 76 % --------- LV end-diastolic volume, 2-p 43 ml --------- LV end-systolic volume, 2-p 12 ml --------- LV ejection fraction, 2-p 72 % --------- Stroke volume, 2-p 31 ml --------- LV end-diastolic volume/bsa, 2-p 20 ml/m^2 --------- LV end-systolic volume/bsa, 2-p 6 ml/m^2 --------- Stroke volume/bsa, 2-p 14.6 ml/m^2 --------- LV e&',  lateral 9.09 cm/s --------- LV E/e&', lateral 11.99 --------- LV e&', medial 5.9 cm/s --------- LV E/e&', medial 18.47 --------- LV e&', average 7.5 cm/s --------- LV E/e&', average 14.54 --------- Longitudinal strain, TDI 21 % ---------  Ventricular septum Value Reference IVS thickness, ED 14.1 mm ---------  LVOT Value Reference LVOT ID, S 20 mm --------- LVOT area 3.14 cm^2 --------- LVOT ID 20 mm --------- LVOT peak velocity, S 111 cm/s --------- LVOT mean velocity, S 80.8 cm/s --------- LVOT VTI, S 31.8 cm ---------  Stroke volume (SV), LVOT DP 99.9 ml --------- Stroke index (SV/bsa), LVOT DP 47.7 ml/m^2 ---------  Aortic valve Value Reference Aortic valve peak velocity, S 390 cm/s --------- Aortic valve mean velocity, S 273 cm/s --------- Aortic valve VTI, S 98.8 cm --------- Aortic mean gradient, S 34 mm Hg --------- Aortic peak gradient, S 61 mm Hg --------- VTI ratio, LVOT/AV 0.32 --------- Aortic valve area, VTI 1.01 cm^2 --------- Aortic valve area/bsa, VTI 0.48 cm^2/m^2 --------- Velocity ratio, peak, LVOT/AV 0.28 --------- Aortic  valve area, peak velocity 0.89 cm^2 --------- Aortic valve area/bsa, peak 0.42 cm^2/m^2 --------- velocity Velocity ratio, mean, LVOT/AV 0.3 --------- Aortic valve area, mean velocity 0.93 cm^2 --------- Aortic valve area/bsa, mean 0.44 cm^2/m^2 --------- velocity Aortic regurg pressure half-time 779 ms ---------  Aorta Value Reference Aortic root ID, ED 35 mm --------- Ascending aorta ID, A-P, S 46 mm ---------  Left atrium Value Reference LA ID, A-P, ES 37 mm --------- LA ID/bsa, A-P 1.76 cm/m^2 <=2.2 LA volume, S 68.5 ml --------- LA volume/bsa, S 32.7 ml/m^2 --------- LA volume, ES, 1-p A4C 58.6 ml --------- LA volume/bsa, ES, 1-p A4C 28 ml/m^2 --------- LA volume, ES, 1-p A2C 71.5 ml --------- LA volume/bsa, ES, 1-p A2C 34.1 ml/m^2 ---------  Mitral valve Value Reference Mitral E-wave peak velocity 109 cm/s --------- Mitral A-wave peak velocity 76.2 cm/s --------- Mitral deceleration time 198 ms 150 - 230 Mitral peak gradient, D 5 mm Hg --------- Mitral E/A ratio, peak 1.4 ---------  Tricuspid valve Value Reference Tricuspid regurg peak velocity 268 cm/s --------- Tricuspid peak RV-RA gradient 29 mm Hg ---------  Right ventricle  Value Reference TAPSE 19.7 mm --------- RV s&', lateral, S 8 cm/s ---------  Pulmonic valve Value Reference Pulmonic valve peak velocity, S 62.6 cm/s ---------  Legend: (L) and (H) mark values outside specified reference range.  ------------------------------------------------------------------- Prepared and Electronically Authenticated by  Prentice Docker, MD 2017-07-05T16:06:02  CATH: Procedures   Right/Left Heart Cath and Coronary Angiography  Conclusion    The left ventricular systolic function is normal.  Nonobstructive CAD.  Normal PA pressures. CI 2.3; PA sat 68%.  Dilated ascending aorta.   Continue with plans for AVR and aneurysm repair.    Indications   Aortic stenosis [I35.0 (ICD-10-CM)]  Complications   Complications documented in old activity   The risks, benefits, and details of the procedure were explained to the patient. The patient verbalized understanding and wanted to proceed. Informed written consent was obtained.   PROCEDURE TECHNIQUE: After Xylocaine anesthesia, a 5 French sheath was placed in the right antecubital area. A 5 French balloontipped Swan-Ganz catheter was advanced to the pulmonary artery under fluoroscopic guidance. Hemodynamic pressures were obtained. Oxygen saturations were obtained. After Xylocaine anesthesia, a 22F sheath was placed in the right radial artery with a single anterior needle wall stick.  Left coronary angiography was done using a Judkins L4 guide catheter. Right coronary angiography was done using an AR1 guide catheter. Left heart cath was done using a pigtail catheter.    The aorti valve was crossed with an AL 1 catheter.   Contrast: 95   During this procedure the patient is administered a total of Versed 1 mg and Fentanyl 25 mg to achieve and maintain moderate  conscious sedation. The patient's heart rate, blood pressure, and oxygen saturation are monitored continuously during the procedure. The period of conscious sedation is 48 minutes, of which I was present face-to-face 100% of this time.   Estimated blood loss <50 mL. There were  no immediate complications during the procedure.      Coronary Findings   Dominance: Co-dominant  Left Anterior Descending  Mid LAD lesion, 25% stenosed.  Second Diagonal [employer] 2nd Diag to 2nd Diag lesion, 50% stenosed.  Right Heart   Right Heart Pressures LV EDP is normal.    Wall Motion              Left Heart   Left Ventricle The left ventricular size is normal. The left ventricular systolic function is normal. The left ventricular ejection fraction is 55-65% by visual estimate. There are no wall motion abnormalities in the left ventricle.    Aortic Valve There is moderate aortic valve stenosis.    Coronary Diagrams   Diagnostic Diagram     Implants     No implant documentation for this case.  PACS Images   Show images for Cardiac catheterization   Link to Procedure Log   Procedure Log    Hemo Data   Flowsheet Row Most Recent Value  Fick Cardiac Output 4.64 L/min  Fick Cardiac Output Index 2.38 (L/min)/BSA  Aortic Mean Gradient 27.8 mmHg  Aortic Peak Gradient 30 mmHg  Aortic Valve Area 0.91  Aortic Value Area Index 0.47 cm2/BSA  RA A Wave 10 mmHg  RA V Wave 8 mmHg  RA Mean 6 mmHg  RV Systolic Pressure 29 mmHg  RV Diastolic Pressure 4 mmHg  RV EDP 10 mmHg  PA Systolic Pressure 31 mmHg  PA Diastolic Pressure 11 mmHg  PA Mean 20 mmHg  PW A Wave 16 mmHg  PW V Wave 14 mmHg  PW Mean 11 mmHg  AO Systolic Pressure 101 mmHg  AO Diastolic Pressure 50 mmHg  AO Mean 69 mmHg  LV Systolic Pressure 138 mmHg  LV Diastolic Pressure 7 mmHg  LV EDP 18 mmHg  Arterial Occlusion Pressure Extended Systolic Pressure 112 mmHg  Arterial Occlusion Pressure Extended Diastolic Pressure 57  mmHg  Arterial Occlusion Pressure Extended Mean Pressure 80 mmHg  Left Ventricular Apex Extended Systolic Pressure 142 mmHg  Left Ventricular Apex Extended Diastolic Pressure 4 mmHg  Left Ventricular Apex Extended EDP Pressure 16 mmHg  QP/QS 1  TPVR Index 8.4 HRUI  TSVR Index 29 HRUI  PVR SVR Ratio 0.14  TPVR/TSVR Ratio 0.29     ECHO 09/2013 Study Conclusions  - Left ventricle: The cavity size was normal. Wall thickness was increased in a pattern of mild LVH. Systolic function was vigorous. The estimated ejection fraction was in the range of 65% to 70%. Wall motion was normal; there were no regional wall motion abnormalities. Doppler parameters are consistent with abnormal left ventricular relaxation (grade 1 diastolic dysfunction). - Aortic valve: Bicuspid; moderately calcified leaflets. Cusp separation was reduced. There was moderate to severe stenosis. There was mild regurgitation. Mean gradient (S): 21 mm Hg. LVOT/AV VTI ratio 0.32. Peak gradient (S): 40 mm Hg. Valve area (VTI): 0.93 cm^2. Valve area (Vmax): 0.96 cm^2. - Ascending aorta: The ascending aorta was moderately dilated. Approximately 46 mm. - Mitral valve: Calcified annulus. There was trivial regurgitation. - Right atrium: Central venous pressure (est): 3 mm Hg. - Atrial septum: No defect or patent foramen ovale was identified. - Tricuspid valve: There was trivial regurgitation. - Pulmonary arteries: PA peak pressure: 29 mm Hg (S). - Pericardium, extracardiac: There was no pericardial effusion  Peak velocity aortic valve 315 cm/sec  Study Conclusions  - Left ventricle: The cavity size was normal. Systolic function was vigorous. The estimated ejection  fraction was in the range of 65% to 70%. Wall motion was normal; there were no regional wall motion abnormalities. Features are consistent with a pseudonormal left ventricular filling pattern, with concomitant abnormal relaxation and increased filling  pressure (grade 2 diastolic dysfunction). Doppler parameters are consistent with high ventricular filling pressure. Mild to moderate concentric left ventricular hypertrophy. - Aortic valve: Mildly to moderately calcified annulus. Moderately thickened, severely calcified leaflets. Cusp separation was reduced. There was moderate to severe stenosis. There was mild regurgitation. Peak velocity (S): 364 cm/s. Mean gradient: 32 mmHg. Valve area (Vmax): 0.91 cm^2. - Aorta: Ascending aortic diameter: 43 mm (S). Mild ascending aortic dilatation. - Mitral valve: Mildly to moderately calcified annulus. There was mild regurgitation.  Impressions:  - When compared to the report dated 10/02/13, the mean aortic valve gradient has increased by 11 mmHg. Moderate to severe stenosis is seen. Aortic Size Index=    4.8    /Body surface area is 2.04 meters squared. = 2.38  < 2.75 cm/m2      4% risk per year 2.75 to 4.25          8% risk per year > 4.25 cm/m2    20% risk per year   Assessment / Plan:   Patient symptomatic  aortic stenosis, peak velocity across the aortic valve estimated at 390   Cm/s Mean  gradient (S): 34 mm Hg. Peak gradient (S): 61 mm Hg. and  probable bicuspid aortic valve with the aorta dilated to 4.7cm. She notes increasing symptoms of sob with activity.    The severity of her aortic stenosis has  Increasing  gradient  from previous echo. With increasing gradient and symptoms I have recommended to the patient proceeding with cardiac cath  And AVR  and replacement of Ascending  aorta.  Use of mechanical versus tissue valve is been discussed with the patient she prefers at her age of 13 a tissue valve and not having to take Coumadin.   Patient has follow-up cardiology appointment next week, she has a dental appointment in 2 weeks. I discussed with her the findings of reactive catheterization and reviewed with her her echocardiograms and CT scan. 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 I will plan to see her back in 4-5 weeks and we will readdress this, hopefully by that time she is also taking care of her dental issues.    The [employer] [facility]) and the [place] Heart Association [facility]) have issued a statement to clarify 2 previous guidelines from the [facility], AHA, and collaborating societies addressing the risk of aortic dissection in patients with bicuspid aortic valves (BAV) and severe aortic enlargement. The 2 guidelines differ with regard to the recommended threshold of aortic root or ascending aortic dilatation that would justify surgical intervention in patients with bicuspid aortic valves. This new statement of clarification uses the ACC/AHA revised structure for delineating the Class of Recommendation and Level of Evidence to provide recommendation that replace those contained in Section 9.2.2.1 of the thoracic aortic disease guidelines and Section 5.1.3 of the valvular heart disease guideline. New recommendations in intervention in patients with BAV and dilatation of the aortic root (sinuses) or ascending aorta include:  . Operative intervention to repair or replace the aortic root (sinuses) or replace the ascending aorta is indicated in asymptomatic patients with BAV if the diameter  of the aortic root or ascending aorta is 5.5 cm or greater. (Class of recommendation 1, Level of evidence B-NR).  Marland Kitchen Operative intervention to repair or replace the aortic root (sinuses) or replace the ascending aorta is reasonable in asymptomatic patients with BAV if the diameter of the aortic root or ascending aorta is 5.0 cm or greater and an additional risk factor for dissection is present or if the patient is at low surgical risk and the surgery is performed by an experienced aortic surgical team in a center with  established expertise in these procedures. (Class of recommendation IIa; Level of Evidence B-NR).  . Replacement of the ascending aorta is reasonable in patients with BAV undergoing AVR because of severe aortic stenosis or aortic regurgitation when the diameter of the ascending aorta is greater than 4.5 cm (Class of recommendation IIa; Level of evidence C-EO).  Citation: Gae Bon, Kristen Loader, et al. Surgery for aortic dilatation in patients with bicuspid aortic valves. A statement of clarification from the [employer]/American Heart Association Task Force on Clinical Practice Guidelines. [Published online ahead of print February 06, 2014]. Circulation. doi: 10.1161/CIR.0000000000000331.   Delight Ovens MD      [address] [place] [place] [address] Office [phone]   Beeper [phone]  09/29/2015 4:50 PM

## 2015-10-06 ENCOUNTER — Other Ambulatory Visit: Payer: Self-pay | Admitting: Cardiology

## 2015-10-08 ENCOUNTER — Encounter: Payer: Self-pay | Admitting: Cardiology

## 2015-10-08 ENCOUNTER — Ambulatory Visit (INDEPENDENT_AMBULATORY_CARE_PROVIDER_SITE_OTHER): Payer: Medicare Other | Admitting: Cardiology

## 2015-10-08 VITALS — BP 121/68 | HR 51 | Ht 60.0 in | Wt 213.8 lb

## 2015-10-08 DIAGNOSIS — Q231 Congenital insufficiency of aortic valve: Secondary | ICD-10-CM

## 2015-10-08 DIAGNOSIS — I350 Nonrheumatic aortic (valve) stenosis: Secondary | ICD-10-CM

## 2015-10-08 DIAGNOSIS — R001 Bradycardia, unspecified: Secondary | ICD-10-CM | POA: Diagnosis not present

## 2015-10-08 DIAGNOSIS — Q2381 Bicuspid aortic valve: Secondary | ICD-10-CM

## 2015-10-08 NOTE — Patient Instructions (Signed)
Your physician recommends that you schedule a follow-up appointment in: MID October WITH DR. BRANCH   Your physician recommends that you continue on your current medications as directed. Please refer to the Current Medication list given to you today.  Thank you for choosing [facility]!!

## 2015-10-08 NOTE — Progress Notes (Signed)
Clinical Summary Angela Dawson is a 70 y.o.female seen today for follow up of the following medical problems. This is a focused visit on her history of aortic stenosis.   1. Aortic stenosis with bicuspid AV and ascending aortic aneurysm.  - 09/2013 echo showed LVEF 65-70%, grade I diastolic dysfunction. Bicuspid AV with moderate to severe AS (mean grad 21, dimensionless index 0.32, valve area 0.93), mild AI. Described mild aorta dilatation from limited views, ascending aorta 4.6 cm. - CTA chest 05/2014 with asscending thoracic aortic aneurysm 4.9 cm.  01/2015 CTA chest ascending aortic aneurysm 4.6 x 4.6 cm. Forllowed by Dr Tyrone Sage.  -06/2014 echo LVEF 65-70%, AVA area 0.91, mean grad 32 - 09/2015 echo LVEF 60-65%, mod to severe AS mean grad 34, AVA 1  - since last visit she completed cath which showed no significant CAD - her symptoms of fatgue and SOB have improved since decreaseing her atenolol.   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 Hypertension      Allergies  Allergen Reactions  . Crestor [Rosuvastatin]     Dizziness  . Lipitor [Atorvastatin]     Unable to stand or move  . Statins     Leg cramps   . Penicillins Rash    Has patient had a PCN reaction causing immediate rash, facial/tongue/throat swelling, SOB or lightheadedness with hypotension: Yes Has patient had a PCN reaction causing severe rash involving mucus membranes or skin necrosis: No Has patient had a PCN reaction that required hospitalization No Has patient had a PCN reaction occurring within the last 10 years: No If all of the above answers are "NO", then may proceed with Cephalosporin use.      Current Outpatient Prescriptions  Medication Sig Dispense Refill  . aspirin 81 MG tablet Take 81 mg by mouth daily.    Marland Kitchen atenolol (TENORMIN) 25 MG tablet Take 0.5 tablets (12.5 mg total) by mouth daily. 45 tablet 3  . hydrochlorothiazide  (HYDRODIURIL) 25 MG tablet TAKE 1 TABLET EVERY DAY 90 tablet 3  . ibuprofen (ADVIL,MOTRIN) 200 MG tablet Take 400 mg by mouth every 12 (twelve) hours.     Marland Kitchen lisinopril (PRINIVIL,ZESTRIL) 20 MG tablet TAKE 1 TABLET EVERY DAY 90 tablet 3   No current facility-administered medications for this visit.      Past Surgical History:  Procedure Laterality Date  . CARDIAC CATHETERIZATION N/A 09/24/2015   Procedure: Right/Left Heart Cath and Coronary Angiography;  Surgeon: Corky Crafts, MD;  Location: [facility] INVASIVE CV LAB;  Service: Cardiovascular;  Laterality: N/A;  . TUBAL LIGATION       Allergies  Allergen Reactions  . Crestor [Rosuvastatin]     Dizziness  . Lipitor [Atorvastatin]     Unable to stand or move  . Statins     Leg cramps   . Penicillins Rash    Has patient had a PCN reaction causing immediate rash, facial/tongue/throat swelling, SOB or lightheadedness with hypotension: Yes Has patient had a PCN reaction causing severe rash involving mucus membranes or skin necrosis: No Has patient had a PCN reaction that required hospitalization No Has patient had a PCN reaction occurring within the last 10 years: No If all of the above answers are "NO", then may proceed with Cephalosporin use.       Family History  Problem Relation Age of Onset  . Alzheimer's disease Mother   . Heart attack Father  Social History Ms. Sides reports that she quit smoking about 42 years ago. Her smoking use included Cigarettes. She started smoking about 47 years ago. She has a 2.50 pack-year smoking history. She has never used smokeless tobacco. Ms. Tetro reports that she does not drink alcohol.   Review of Systems CONSTITUTIONAL: No weight loss, fever, chills, weakness or fatigue.  HEENT: Eyes: No visual loss, blurred vision, double vision or yellow sclerae.No hearing loss, sneezing, congestion, runny nose or sore throat.  SKIN: No rash or itching.  CARDIOVASCULAR: per  HPI RESPIRATORY: No shortness of breath, cough or sputum.  GASTROINTESTINAL: No anorexia, nausea, vomiting or diarrhea. No abdominal pain or blood.  GENITOURINARY: No burning on urination, no polyuria NEUROLOGICAL: No headache, dizziness, syncope, paralysis, ataxia, numbness or tingling in the extremities. No change in bowel or bladder control.  MUSCULOSKELETAL: No muscle, back pain, joint pain or stiffness.  LYMPHATICS: No enlarged nodes. No history of splenectomy.  PSYCHIATRIC: No history of depression or anxiety.  ENDOCRINOLOGIC: No reports of sweating, cold or heat intolerance. No polyuria or polydipsia.  Marland Kitchen   Physical Examination Vitals:   10/08/15 1536  BP: 121/68  Pulse: (!) 51   Vitals:   10/08/15 1536  Weight: 213 lb 12.8 oz (97 kg)  Height: 5' (1.524 m)    Gen: resting comfortably, no acute distress HEENT: no scleral icterus, pupils equal round and reactive, no palptable cervical adenopathy,  CV: RRR, 3/6 sysotlic murmur RUSB, no jvd Resp: Clear to auscultation bilaterally GI: abdomen is soft, non-tender, non-distended, normal bowel sounds, no hepatosplenomegaly MSK: extremities are warm, no edema.  Skin: warm, no rash Neuro:  no focal deficits Psych: appropriate affect   Diagnostic Studies 09/2013 Echo Study Conclusions  - Left ventricle: The cavity size was normal. Wall thickness was increased in a pattern of mild LVH. Systolic function was vigorous. The estimated ejection fraction was in the range of 65% to 70%. Wall motion was normal; there were no regional wall motion abnormalities. Doppler parameters are consistent with abnormal left ventricular relaxation (grade 1 diastolic dysfunction). - Aortic valve: Bicuspid; moderately calcified leaflets. Cusp separation was reduced. There was moderate to severe stenosis. There was mild regurgitation. Mean gradient (S): 21 mm Hg. LVOT/AV VTI ratio 0.32. Peak gradient (S): 40 mm Hg. Valve area (VTI): 0.93 cm^2.  Valve area (Vmax): 0.96 cm^2. - Ascending aorta: The ascending aorta was moderately dilated. Approximately 46 mm. - Mitral valve: Calcified annulus. There was trivial regurgitation. - Right atrium: Central venous pressure (est): 3 mm Hg. - Atrial septum: No defect or patent foramen ovale was identified. - Tricuspid valve: There was trivial regurgitation. - Pulmonary arteries: PA peak pressure: 29 mm Hg (S). - Pericardium, extracardiac: There was no pericardial effusion.  Impressions:  - Mild LVH with LVEF 65-70%, grade 1 diastolic dysfunction. Moderate to severe aortic stenosis as outlined with bicuspid aortic valve and mild aortic regurgitation. There is also at least moderate dilatation of the ascending aorta based on limited views. Given association of aortic aneurysmal disease with bicuspid aortic valve, would suggest further dedicated imaging of the thoracic aorta with CTA or MRA. Normal PASP 29 mmHg.   05/2014 CTA chest IMPRESSION: Maximal diameter of the ascending aorta is 4.9 cm compare with 4.8 cm on the prior study. No evidence of dissection.  There are airspace and ground-glass opacities in the left upper lobe most consistent with an inflammatory process. There is also a 2 mm nodule. If the patient is at high risk for  bronchogenic carcinoma, follow-up chest CT at 1 year is recommended. If the patient is at low risk, no follow-up is needed. This recommendation follows the consensus statement: Guidelines for Management of Small Pulmonary Nodules Detected on CT Scans: A Statement from the Fleischner Society as published in Radiology 2005; 237:395-400.   ADDENDUM: The patient therefore has a thoracic aortic aneurysm that has only increased from 4.8 cm to 4.9 cm since August of last year. Ascending thoracic aortic aneurysm. Recommend semi-annual imaging followup by CTA or MRA and referral to cardiothoracic surgery if not already obtained. This recommendation follows  2010 ACCF/AHA/AATS/ACR/ASA/SCA/SCAI/SIR/STS/SVM Guidelines for the Diagnosis and Management of Patients With Thoracic Aortic Disease. Circulation. 2010; 121: W098-J191   06/2014 Echo Study Conclusions  - Left ventricle: The cavity size was normal. Systolic function was vigorous. The estimated ejection fraction was in the range of 65% to 70%. Wall motion was normal; there were no regional wall motion abnormalities. Features are consistent with a pseudonormal left ventricular filling pattern, with concomitant abnormal relaxation and increased filling pressure (grade 2 diastolic dysfunction). Doppler parameters are consistent with high ventricular filling pressure. Mild to moderate concentric left ventricular hypertrophy. - Aortic valve: Mildly to moderately calcified annulus. Moderately thickened, severely calcified leaflets. Cusp separation was reduced. There was moderate to severe stenosis. There was mild regurgitation. Peak velocity (S): 364 cm/s. Mean gradient: 32 mmHg. Valve area (Vmax): 0.91 cm^2. - Aorta: Ascending aortic diameter: 43 mm (S). Mild ascending aortic dilatation. - Mitral valve: Mildly to moderately calcified annulus. There was mild regurgitation.  Impressions:  - When compared to the report dated 10/02/13, the mean aortic valve gradient has increased by 11 mmHg. Moderate to severe stenosis is seen.  01/2015 CTA chest IMPRESSION: Dilatation of the thoracic aorta in its ascending component measuring 4.6 x 4.6 cm in greatest dimension. This is likely stable from the prior exam as some motion artifact was seen on the prior study.  09/2015 echo Study Conclusions  - Left ventricle: The cavity size was normal. Wall thickness was  increased in a pattern of moderate LVH. Systolic function was  normal. The estimated ejection fraction was in the range of 60%  to 65%. Wall motion was normal; there were no regional wall   motion abnormalities. Features are consistent with a pseudonormal  left ventricular filling pattern, with concomitant abnormal  relaxation and increased filling pressure (grade 2 diastolic  dysfunction). Doppler parameters are consistent with high  ventricular filling pressure. - Aortic valve: Moderately thickened, severely calcified leaflets.  There was moderate to severe stenosis. There was mild  regurgitation. Peak velocity (S): 390 cm/s. Mean gradient (S): 34  mm Hg. Valve area (VTI): 1.01 cm^2. Valve area (Vmax): 0.89 cm^2.  Valve area (Vmean): 0.93 cm^2. - Aorta: Ascending aortic diameter: 46 mm (S). - Ascending aorta: The ascending aorta was mild to moderately  dilated. - Mitral valve: Calcified annulus. There was mild regurgitation. - Left atrium: The atrium was moderately dilated. - Right ventricle: Systolic function was mildly to moderately  reduced. - Tricuspid valve: There was mild-moderate regurgitation.  09/2015 Cath  The left ventricular systolic function is normal.  Nonobstructive CAD.  Normal PA pressures. CI 2.3; PA sat 68%.  Dilated ascending aorta.   Continue with plans for AVR and aneurysm repair  Assessment and Plan  1. Bicuspid aortic valve with aortic stenosis and aortic aneurysm - moderate to severe. Her symptoms of SOB and fatigue have improved since cutting down her atentolol. She is likely very near requiring  intervention for her valve and aneurysm. 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 - continue close monitoring at this time.   2. Bradycardia - she is on beta blocker due to aortic aneurysm - sinus bradycardia today, denies any symptoms. We will continue beta blocker at this time.     Antoine Poche, [name]

## 2015-10-11 ENCOUNTER — Encounter: Payer: Self-pay | Admitting: Pharmacist

## 2015-10-11 ENCOUNTER — Ambulatory Visit (INDEPENDENT_AMBULATORY_CARE_PROVIDER_SITE_OTHER): Payer: Medicare Other | Admitting: Pharmacist

## 2015-10-11 VITALS — BP 138/64 | HR 60 | Ht 60.25 in | Wt 213.0 lb

## 2015-10-11 DIAGNOSIS — Z23 Encounter for immunization: Secondary | ICD-10-CM

## 2015-10-11 DIAGNOSIS — Z0000 Encounter for general adult medical examination without abnormal findings: Secondary | ICD-10-CM

## 2015-10-11 NOTE — Progress Notes (Addendum)
Patient ID: Angela Dawson, female   DOB: 11/01/1945, 70 y.o.   MRN: [id number]    Subjective:   Angela Dawson is a 70 y.o. female who presents for an Initial Medicare Annual Wellness Visit. Angela Dawson is widowed.  She lives alone.  She has one son who lives close by in [place], [place]. 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   She reports that she feels good except occasional fatigue which is associated with her heart problems.  She is planning surgery in the fall for aneurysm and stenosis due to bicuspid aortic valve defect.  Review of Systems  Review of Systems  Constitutional: Positive for malaise/fatigue and weight loss (patient is trying to lose weight prior to surgery).  HENT: Negative.   Eyes: Negative.   Respiratory: Negative.   Cardiovascular: Negative.   Gastrointestinal: Negative.   Genitourinary: Negative.   Musculoskeletal: Negative.   Skin: Negative.   Neurological: Negative.   Endo/Heme/Allergies: Negative.   Psychiatric/Behavioral: Negative.      Current Medications (verified) Outpatient Encounter Prescriptions as of 10/11/2015  Medication Sig  . aspirin 81 MG tablet Take 81 mg by mouth daily.  Marland Kitchen. atenolol (TENORMIN) 25 MG tablet Take 0.5 tablets (12.5 mg total) by mouth daily.  . hydrochlorothiazide (HYDRODIURIL) 25 MG tablet TAKE 1 TABLET EVERY DAY  . ibuprofen (ADVIL,MOTRIN) 200 MG tablet Take 400 mg by mouth as needed.   Marland Kitchen. lisinopril (PRINIVIL,ZESTRIL) 20 MG tablet TAKE 1 TABLET EVERY DAY   No facility-administered encounter medications on file as of 10/11/2015.     Allergies (verified) Crestor [rosuvastatin]; Lipitor [atorvastatin]; Statins; and Penicillins   History: Past Medical History:  Diagnosis Date  . Allergy    seasonal  . Chicken pox   . Hypertension   . Measles    Past Surgical History:  Procedure Laterality Date  . CARDIAC CATHETERIZATION N/A  09/24/2015   Procedure: Right/Left Heart Cath and Coronary Angiography;  Surgeon: Corky CraftsJayadeep S Varanasi, MD;  [facility];  Service: Cardiovascular;  Laterality: N/A;  . TUBAL LIGATION     Family History  Problem Relation Age of Onset  . Alzheimer's disease Mother   . Heart disease Mother   . Heart attack Father [id number]   Social History   Occupational History  . Not on file.   Social History Main Topics  . Smoking status: Former Smoker    Packs/day: 0.50    Years: 5.00    Types: Cigarettes    Start date: 08/04/1968    Quit date: 10/10/1973  . Smokeless tobacco: Never Used     Comment: smoked less than 1 pack every 2 weeks. Havent smoked in atleast 40 years  . Alcohol use No  . Drug use: No  . Sexual activity: No    Do you feel safe at home?  Yes Are there smokers in your home (other than you)? No  Dietary issues and exercise activities: Current Exercise Habits: The patient does not participate in regular exercise at present, Exercise limited by: cardiac condition(s)  Current Dietary habits:  Patient has been limiting serving sizes and has cut out all snack foods.  She has lost about 4# over the last 2 weeks.   Objective:    Today's Vitals   10/11/15 1039  BP: 138/64  Pulse: 60  Weight: 213 lb (96.6 kg)  Height: 5' 0.25" (1.53 m)  PainSc: 0-No  pain   Body mass index is 41.25 kg/m.  Activities of Daily Living In your present state of health, do you have any difficulty performing the following activities: 10/11/2015 09/24/2015  Hearing? N N  Vision? N N  Difficulty concentrating or making decisions? N N  Walking or climbing stairs? Y Y  Dressing or bathing? N N  Doing errands, shopping? N -  Preparing Food and eating ? N -  Using the Toilet? N -  In the past six months, have you accidently leaked urine? N -  Do you have problems with loss of bowel control? N -  Managing your Medications? N -  Managing your Finances? N -  Housekeeping or managing your  Housekeeping? N -  Some recent data might be hidden     Cardiac Risk Factors include: advanced age (>63men, >67 women);hypertension;obesity (BMI >30kg/m2);sedentary lifestyle  Depression Screen PHQ 2/9 Scores 10/11/2015 08/30/2015 07/29/2014 04/29/2014  PHQ - 2 Score 0 0 0 0     Fall Risk Fall Risk  10/11/2015 08/30/2015 07/29/2014 04/29/2014 09/30/2013  Falls in the past year? No No No No Yes  Number falls in past yr: - - - - 1  Injury with Fall? - - - - (No Data)    Cognitive Function: MMSE - Mini Mental State Exam 10/11/2015  Orientation to time 5  Orientation to Place 5  Registration 3  Attention/ Calculation 5  Recall 3  Language- name 2 objects 2  Language- repeat 1  Language- follow 3 step command 3  Language- read & follow direction 1  Write a sentence 1  Copy design 1  Total score 30    Immunizations and Health Maintenance Immunization History  Administered Date(s) Administered  . Pneumococcal Conjugate-13 10/11/2015   Health Maintenance Due  Topic Date Due  . INFLUENZA VACCINE  10/05/2015    Patient Care Team: Bennie Pierini, FNP as PCP - General (Family Medicine) Antoine Poche, MD as Consulting Physician (Cardiology) Delight Ovens, MD as Consulting Physician (Cardiothoracic Surgery)  Indicate any recent Medical Services you may have received from other than [facility] providers in the past year (date may be approximate).    Assessment:    Annual Wellness Visit    Screening Tests Health Maintenance  Topic Date Due  . INFLUENZA VACCINE  10/05/2015  . PNA vac Low Risk Adult (1 of 2 - PCV13) 02/29/2016 (Originally 08/05/2011)  . MAMMOGRAM  06/01/2016  . TETANUS/TDAP  07/17/2017  . COLONOSCOPY  11/10/2021  . DEXA SCAN  Completed  . ZOSTAVAX  Completed  . Hepatitis C Screening  Completed        Plan:   During the course of the visit Angela Dawson was educated and counseled about the following appropriate screening and preventive services:   Vaccines to  include Pneumoccal, Influenza, Td, Zostavax - patient recived Prevnar 13 today in office.  She is reminded about influenza vaccine for fall but she states she does not usually get this  Colorectal cancer screening - UTD  Cardiovascular disease screening - UTD, seeing cardiologist on regular basis  Diabetes screening - Last FBG was elevated - recommend checking A1c at next last draw  Bone Denisty / Osteoporosis Screening - last was 2013.  I cannot see result - will request copy to verify was WNL  Mammogram - due 2018  Glaucoma screening / Eye Exam - patient is making appt  Nutrition counseling - reviewed tips for decreasing caloric intake.  Advanced Directives - packet given and  discussed  Physical Activity - not recommended at this time due to cardiac condition  Patient Instructions (the written plan) were given to the patient.   Henrene Pastor, PharmD   10/11/2015    Addendum: Reviewed Dexa from 01/03/2012 and was normal T-Score for L1-L4 = -0.1 T-Score for neck of left hip = -1.0 T-Score for neck for right hip = -1.0  Recommend recheck q 5years

## 2015-10-11 NOTE — Patient Instructions (Addendum)
Angela Dawson , Thank you for taking time to come for your Medicare Wellness Visit. I appreciate your ongoing commitment to your health goals. Please review the following plan we discussed and let me know if I can assist you in the future.   These are the goals we discussed:  Continue with decreasing portion sizes - your weight has decreased by about 4 lbs.   Increase non-starchy vegetables - carrots, green bean, squash, zucchini, tomatoes, onions, peppers, spinach and other green leafy vegetables, cabbage, lettuce, cucumbers, asparagus, okra (not fried), eggplant Limit sugar and processed foods (cakes, cookies, ice cream, crackers and chips) Increase fresh fruit but limit serving sizes 1/2 cup or about the size of tennis or baseball Limit red meat to no more than 1-2 times per week (serving size about the size of your palm) Choose whole grains / lean proteins - whole wheat bread, quinoa, whole grain rice (1/2 cup), fish, chicken, [place] Avoid sugar and calorie containing beverages - soda, sweet tea and juice.  Choose water or unsweetened tea instead.   This is a list of the screening recommended for you and due dates:  Health Maintenance  Topic Date Due  . Flu Shot  10/05/2015  . Pneumonia vaccines (1 of 2 - PCV13) Completed today  . Mammogram  06/01/2016  . Tetanus Vaccine  07/17/2017  . Colon Cancer Screening  11/10/2021  . DEXA scan (bone density measurement)  Completed  . Shingles Vaccine  Completed  .  Hepatitis C: One time screening is recommended by Center for Disease Control  (CDC) for  adults born from [birth year] through 1964.   Completed  *Topic was postponed. The date shown is not the original due date.   Health Maintenance, Female Adopting a healthy lifestyle and getting preventive care can go a long way to promote health and wellness. Talk with your health care provider about what schedule of regular examinations is right for you. This is a good chance for you to check in with your  provider about disease prevention and staying healthy. In between checkups, there are plenty of things you can do on your own. Experts have done a lot of research about which lifestyle changes and preventive measures are most likely to keep you healthy. Ask your health care provider for more information. WEIGHT AND DIET  Eat a healthy diet  Be sure to include plenty of vegetables, fruits, low-fat dairy products, and lean protein.  Do not eat a lot of foods high in solid fats, added sugars, or salt.  Get regular exercise. This is one of the most important things you can do for your health.  Most adults should exercise for at least 150 minutes each week. The exercise should increase your heart rate and make you sweat (moderate-intensity exercise).  Most adults should also do strengthening exercises at least twice a week. This is in addition to the moderate-intensity exercise.  Maintain a healthy weight  Body mass index (BMI) is a measurement that can be used to identify possible weight problems. It estimates body fat based on height and weight. Your health care provider can help determine your BMI and help you achieve or maintain a healthy weight.  For females 72 years of age and older:   A BMI below 18.5 is considered underweight.  A BMI of 18.5 to 24.9 is normal.  A BMI of 25 to 29.9 is considered overweight.  A BMI of 30 and above is considered obese.  Watch levels of cholesterol and  blood lipids  You should start having your blood tested for lipids and cholesterol at 69 years of age, then have this test every 5 years.  You may need to have your cholesterol levels checked more often if:  Your lipid or cholesterol levels are high.  You are older than 69 years of age.  You are at high risk for heart disease.  CANCER SCREENING   Lung Cancer  Lung cancer screening is recommended for adults 58-40 years old who are at high risk for lung cancer because of a history of  smoking.  A yearly low-dose CT scan of the lungs is recommended for people who:  Currently smoke.  Have quit within the past 15 years.  Have at least a 30-pack-year history of smoking. A pack year is smoking an average of one pack of cigarettes a day for 1 year.  Yearly screening should continue until it has been 15 years since you quit.  Yearly screening should stop if you develop a health problem that would prevent you from having lung cancer treatment.  Breast Cancer  Practice breast self-awareness. This means understanding how your breasts normally appear and feel.  It also means doing regular breast self-exams. Let your health care provider know about any changes, no matter how small.  If you are in your 20s or 30s, you should have a clinical breast exam (CBE) by a health care provider every 1-3 years as part of a regular health exam.  If you are 55 or older, have a CBE every year. Also consider having a breast X-ray (mammogram) every year.  If you have a family history of breast cancer, talk to your health care provider about genetic screening.  If you are at high risk for breast cancer, talk to your health care provider about having an MRI and a mammogram every year.  Breast cancer gene (BRCA) assessment is recommended for women who have family members with BRCA-related cancers. BRCA-related cancers include:  Breast.  Ovarian.  Tubal.  Peritoneal cancers.  Results of the assessment will determine the need for genetic counseling and BRCA1 and BRCA2 testing. Cervical Cancer Your health care provider may recommend that you be screened regularly for cancer of the pelvic organs (ovaries, uterus, and vagina). This screening involves a pelvic examination, including checking for microscopic changes to the surface of your cervix (Pap test). You may be encouraged to have this screening done every 3 years, beginning at age 37.  For women ages 50-65, health care providers may  recommend pelvic exams and Pap testing every 3 years, or they may recommend the Pap and pelvic exam, combined with testing for human papilloma virus (HPV), every 5 years. Some types of HPV increase your risk of cervical cancer. Testing for HPV may also be done on women of any age with unclear Pap test results.  Other health care providers may not recommend any screening for nonpregnant women who are considered low risk for pelvic cancer and who do not have symptoms. Ask your health care provider if a screening pelvic exam is right for you.  If you have had past treatment for cervical cancer or a condition that could lead to cancer, you need Pap tests and screening for cancer for at least 20 years after your treatment. If Pap tests have been discontinued, your risk factors (such as having a new sexual partner) need to be reassessed to determine if screening should resume. Some women have medical problems that increase the chance of getting  cervical cancer. In these cases, your health care provider may recommend more frequent screening and Pap tests. Colorectal Cancer  This type of cancer can be detected and often prevented.  Routine colorectal cancer screening usually begins at 69 years of age and continues through 69 years of age.  Your health care provider may recommend screening at an earlier age if you have risk factors for colon cancer.  Your health care provider may also recommend using home test kits to check for hidden blood in the stool.  A small camera at the end of a tube can be used to examine your colon directly (sigmoidoscopy or colonoscopy). This is done to check for the earliest forms of colorectal cancer.  Routine screening usually begins at age 12.  Direct examination of the colon should be repeated every 5-10 years through 69 years of age. However, you may need to be screened more often if early forms of precancerous polyps or small growths are found. Skin Cancer  Check your  skin from head to toe regularly.  Tell your health care provider about any new moles or changes in moles, especially if there is a change in a mole's shape or color.  Also tell your health care provider if you have a mole that is larger than the size of a pencil eraser.  Always use sunscreen. Apply sunscreen liberally and repeatedly throughout the day.  Protect yourself by wearing long sleeves, pants, a wide-brimmed hat, and sunglasses whenever you are outside. HEART DISEASE, DIABETES, AND HIGH BLOOD PRESSURE   High blood pressure causes heart disease and increases the risk of stroke. High blood pressure is more likely to develop in:  People who have blood pressure in the high end of the normal range (130-139/85-89 mm Hg).  People who are overweight or obese.  People who are African American.  If you are 110-10 years of age, have your blood pressure checked every 3-5 years. If you are 31 years of age or older, have your blood pressure checked every year. You should have your blood pressure measured twice--once when you are at a hospital or clinic, and once when you are not at a hospital or clinic. Record the average of the two measurements. To check your blood pressure when you are not at a hospital or clinic, you can use:  An automated blood pressure machine at a pharmacy.  A home blood pressure monitor.  If you are between 59 years and 27 years old, ask your health care provider if you should take aspirin to prevent strokes.  Have regular diabetes screenings. This involves taking a blood sample to check your fasting blood sugar level.  If you are at a normal weight and have a low risk for diabetes, have this test once every three years after 69 years of age.  If you are overweight and have a high risk for diabetes, consider being tested at a younger age or more often. PREVENTING INFECTION  Hepatitis B  If you have a higher risk for hepatitis B, you should be screened for this  virus. You are considered at high risk for hepatitis B if:  You were born in a country where hepatitis B is common. Ask your health care provider which countries are considered high risk.  Your parents were born in a high-risk country, and you have not been immunized against hepatitis B (hepatitis B vaccine).  You have HIV or AIDS.  You use needles to inject street drugs.  You live with  someone who has hepatitis B.  You have had sex with someone who has hepatitis B.  You get hemodialysis treatment.  You take certain medicines for conditions, including cancer, organ transplantation, and autoimmune conditions. Hepatitis C  Blood testing is recommended for:  Everyone born from [birth year] through 1964.  Anyone with known risk factors for hepatitis C. Sexually transmitted infections (STIs)  You should be screened for sexually transmitted infections (STIs) including gonorrhea and chlamydia if:  You are sexually active and are younger than 69 years of age.  You are older than 69 years of age and your health care provider tells you that you are at risk for this type of infection.  Your sexual activity has changed since you were last screened and you are at an increased risk for chlamydia or gonorrhea. Ask your health care provider if you are at risk.  If you do not have HIV, but are at risk, it may be recommended that you take a prescription medicine daily to prevent HIV infection. This is called pre-exposure prophylaxis (PrEP). You are considered at risk if:  You are sexually active and do not regularly use condoms or know the HIV status of your partner(s).  You take drugs by injection.  You are sexually active with a partner who has HIV. Talk with your health care provider about whether you are at high risk of being infected with HIV. If you choose to begin PrEP, you should first be tested for HIV. You should then be tested every 3 months for as long as you are taking PrEP.  PREGNANCY    If you are premenopausal and you may become pregnant, ask your health care provider about preconception counseling.  If you may become pregnant, take 400 to 800 micrograms (mcg) of folic acid every day.  If you want to prevent pregnancy, talk to your health care provider about birth control (contraception). OSTEOPOROSIS AND MENOPAUSE   Osteoporosis is a disease in which the bones lose minerals and strength with aging. This can result in serious bone fractures. Your risk for osteoporosis can be identified using a bone density scan.  If you are 74 years of age or older, or if you are at risk for osteoporosis and fractures, ask your health care provider if you should be screened.  Ask your health care provider whether you should take a calcium or vitamin D supplement to lower your risk for osteoporosis.  Menopause may have certain physical symptoms and risks.  Hormone replacement therapy may reduce some of these symptoms and risks. Talk to your health care provider about whether hormone replacement therapy is right for you.  HOME CARE INSTRUCTIONS   Schedule regular health, dental, and eye exams.  Stay current with your immunizations.   Do not use any tobacco products including cigarettes, chewing tobacco, or electronic cigarettes.  If you are pregnant, do not drink alcohol.  If you are breastfeeding, limit how much and how often you drink alcohol.  Limit alcohol intake to no more than 1 drink per day for nonpregnant women. One drink equals 12 ounces of beer, 5 ounces of wine, or 1 ounces of hard liquor.  Do not use street drugs.  Do not share needles.  Ask your health care provider for help if you need support or information about quitting drugs.  Tell your health care provider if you often feel depressed.  Tell your health care provider if you have ever been abused or do not feel safe at home.  This information is not intended to replace advice given to you by your  health care provider. Make sure you discuss any questions you have with your health care provider.   Document Released: 09/05/2010 Document Revised: 03/13/2014 Document Reviewed: 01/22/2013 Elsevier Interactive Patient Education [employer].

## 2015-11-01 ENCOUNTER — Telehealth: Payer: Self-pay | Admitting: Cardiology

## 2015-11-01 NOTE — Telephone Encounter (Signed)
Pt aware and voiced understanding that she would not need prophylaxis until after she has valve replacement

## 2015-11-01 NOTE — Telephone Encounter (Signed)
I agree with Dr McDowell's assessment, antibiotics prior to dental procedures are not required for her at this time based on the most recent guidelines. After she has her valve replaced she will need them prior to dental surgeries at that time, but currently I do not recommend.   Dominga FerryJ Minie Roadcap MD

## 2015-11-01 NOTE — Telephone Encounter (Signed)
Pt says she is have 2 teeth extracted 10/6 and that Dr. Tyrone SageGerhardt says she would need antibiotics prior. Will forward to provider

## 2015-11-01 NOTE — Telephone Encounter (Signed)
Mrs. Angela Dawson called stating that she has an upcoming appointment to have dental surgery. She is wanting to know if Dr. Wyline MoodBranch will call in antibiotics.

## 2015-11-01 NOTE — Telephone Encounter (Signed)
Patient of Dr. Wyline MoodBranch. I reviewed the recent office note from August 4. She has a bicuspid aortic valve with aortic stenosis and associated aortic aneurysm. This has not yet been surgically repaired. Technically speaking, does not require preprocedure antibiotics for dental work according to SBE prophylaxis guidelines. Since Dr. Tyrone SageGerhardt recommended it however, and I am not certain how Dr. Wyline MoodBranch would handle this, I would suggest relating this message to him to get final approval. She is allergic to penicillin and would need to have clindamycin 600 mg given. Dr. Wyline MoodBranch is reviewing charts in the evenings while out, please defer to his recommendation.

## 2015-11-04 ENCOUNTER — Ambulatory Visit (INDEPENDENT_AMBULATORY_CARE_PROVIDER_SITE_OTHER): Payer: Medicare Other | Admitting: Cardiothoracic Surgery

## 2015-11-04 VITALS — BP 134/66 | HR 52 | Resp 16 | Ht 60.0 in | Wt 213.0 lb

## 2015-11-04 DIAGNOSIS — I350 Nonrheumatic aortic (valve) stenosis: Secondary | ICD-10-CM

## 2015-11-04 DIAGNOSIS — Q231 Congenital insufficiency of aortic valve: Secondary | ICD-10-CM | POA: Diagnosis not present

## 2015-11-04 DIAGNOSIS — I7121 Aneurysm of the ascending aorta, without rupture: Secondary | ICD-10-CM

## 2015-11-04 DIAGNOSIS — Q230 Congenital stenosis of aortic valve: Secondary | ICD-10-CM

## 2015-11-04 DIAGNOSIS — I712 Thoracic aortic aneurysm, without rupture: Secondary | ICD-10-CM | POA: Diagnosis not present

## 2015-11-04 NOTE — Progress Notes (Signed)
[address]       [place] [address]             [phone]                    [name] [facility] Medical Record #[id number] Date of Birth: Jun 23, 1945  Referring: Antoine Poche, MD Primary Care: Bennie Pierini, FNP  Chief Complaint:    Chief Complaint  Patient presents with  . Follow-up    to further discuss surgery    History of Present Illness:    Angela Dawson 70 y.o. female is seen in the office 8 months ago for evaluation   moderately dilated ascending aorta. She has also noted to have moderate  aortic stenosis  of a bicuspid aortic valve. The patient Was asymptomatic from a cardiac standpoint. 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 She denies  PND, pedal edema, syncope, presyncope..   Other than hypertension the patient has no previous history of cardiac disease  She does have a family history of cardiac disease her father died of myocardial infarction age 8   No family history of aortic dissection  Current Activity/ Functional Status:  Patient is independent with mobility/ambulation, transfers, ADL's, IADL's.   Zubrod Score: At the time of surgery this patient's most appropriate activity status/level should be described as: []     0    Normal activity, no symptoms [x]     1    Restricted in physical strenuous activity but ambulatory, able to do out light work []     2    Ambulatory and capable of self care, unable to do work activities, up and about               >50 % of waking hours                              []     3    Only limited self care, in bed greater than 50% of waking hours []     4    Completely disabled, no self care, confined to bed or chair []     5    Moribund   Past Medical History:  Diagnosis Date  . Allergy    seasonal  . Chicken  pox   . Hypertension   . Measles     Past Surgical History:  Procedure Laterality Date  . CARDIAC CATHETERIZATION N/A 09/24/2015   Procedure: Right/Left Heart Cath and Coronary Angiography;  Surgeon: Corky Crafts, MD;  Location: [facility] INVASIVE CV LAB;  Service: Cardiovascular;  Laterality: N/A;  . TUBAL LIGATION      Family History  Problem Relation Age of Onset  . Alzheimer's disease Mother   . Heart disease Mother   . Heart attack Father 70      History  Smoking Status  . Former Smoker  . Packs/day: 0.50  . Years: 5.00  . Types: Cigarettes  . Start date: 08/04/1968  . Quit date: 10/10/1973  Smokeless Tobacco  . Never Used    Comment: smoked less than 1 pack every 2 weeks. Havent smoked in atleast 40 years    History  Alcohol Use No     Allergies  Allergen Reactions  .  Crestor [Rosuvastatin]     Dizziness  . Lipitor [Atorvastatin]     Unable to stand or move  . Statins     Leg cramps   . Penicillins Rash    Has patient had a PCN reaction causing immediate rash, facial/tongue/throat swelling, SOB or lightheadedness with hypotension: Yes Has patient had a PCN reaction causing severe rash involving mucus membranes or skin necrosis: No Has patient had a PCN reaction that required hospitalization No Has patient had a PCN reaction occurring within the last 10 years: No If all of the above answers are "NO", then may proceed with Cephalosporin use.     Current Outpatient Prescriptions  Medication Sig Dispense Refill  . aspirin 81 MG tablet Take 81 mg by mouth daily.    Marland Kitchen atenolol (TENORMIN) 25 MG tablet Take 0.5 tablets (12.5 mg total) by mouth daily. 45 tablet 3  . cetirizine (ZYRTEC) 10 MG tablet Take 10 mg by mouth daily.    . hydrochlorothiazide (HYDRODIURIL) 25 MG tablet TAKE 1 TABLET EVERY DAY 90 tablet 3  . ibuprofen (ADVIL,MOTRIN) 200 MG tablet Take 400 mg by mouth as needed.     Marland Kitchen lisinopril (PRINIVIL,ZESTRIL) 20 MG tablet TAKE 1 TABLET EVERY DAY 90  tablet 3   No current facility-administered medications for this visit.      Review of Systems:     Cardiac Review of Systems: Y or N  Chest Pain [ n   ]  Resting SOB [n   ] Exertional SOB  [ n ]  Orthopnea [n  ]   Pedal Edema [ n  ]    Palpitations [n  ] Syncope  Milo.Brash  ]   Presyncope [ n  ]  General Review of Systems: [Y] = yes [  ]=no Constitional: recent weight change [  ];  Wt loss over the last 3 months [  n ] anorexia [  ]; fatigue [n  ]; nausea [  ]; night sweats [  ]; fever [  ]; or chills [  ];          Dental: poor dentition[  ]; Last Dentist visit:   Eye : blurred vision [ n ]; diplopia [  n ]; vision changes [ n ];  Amaurosis fugax[n  ]; Resp: cough [  ];  wheezing[n  ];  hemoptysis[n  ]; shortness of breath[ n ]; paroxysmal nocturnal dyspnea[  ]; dyspnea on exertion[  ]; or orthopnea[  ];  GI:  gallstones[  ], vomiting[  ];  dysphagia[  ]; melena[  ];  hematochezia [  ]; heartburn[  ];   Hx of  Colonoscopy[  ]; GU: kidney stones [  ]; hematuria[  ];   dysuria [  ];  nocturia[  ];  history of     obstruction [  ]; urinary frequency [  ]             Skin: rash, swelling[  ];, hair loss[  ];  peripheral edema[  ];  or itching[  ]; Musculosketetal: myalgias[  ];  joint swelling[  ];  joint erythema[  ];  joint pain[  ];  back pain[  ];  Heme/Lymph: bruising[  ];  bleeding[  ];  anemia[  ];  Neuro: TIA[n  ];  headaches[  ];  stroke[  ];  vertigo[  ];  seizures[  ];   paresthesias[ n ];  difficulty walking[n  ];  Psych:depression[ n ]; anxiety[  ];  Endocrine:  diabetes[  ];  thyroid dysfunction[  ];  Immunizations: Flu up to date Cove.Etienne  ]; Pneumococcal up to date Cove.Etienne  ];  Other:  Physical Exam: BP 134/66   Pulse (!) 52   Resp 16   Ht 5' (1.524 m)   Wt 213 lb (96.6 kg)   SpO2 96% Comment: ON RA  BMI 41.60 kg/m   PHYSICAL EXAMINATION:  General appearance: alert, cooperative, appears stated age and no distress Neurologic: intact Heart: systolic murmur: early systolic 3/6,  crescendo at 2nd right intercostal space Lungs: clear to auscultation bilaterally Abdomen: soft, non-tender; bowel sounds normal; no masses,  no organomegaly Extremities: extremities normal, atraumatic, no cyanosis or edema and Homans sign is negative, no sign of DVT Patient has no carotid bruits, has full and equal brachial radial and pedal pulses Has no cervical or supraclavicular adenopathy  Diagnostic Studies & Laboratory data:     Recent Radiology Findings:  Ct Angio Chest Aorta W/cm &/or Wo/cm  01/26/2015  CLINICAL DATA:  Followup thoracic ascending aorta aneurysm EXAM: CT ANGIOGRAPHY CHEST WITH CONTRAST TECHNIQUE: Multidetector CT imaging of the chest was performed using the standard protocol during bolus administration of intravenous contrast. Multiplanar CT image reconstructions and MIPs were obtained to evaluate the vascular anatomy. CONTRAST:  75 mL Isovue 370 COMPARISON:  06/04/2014 FINDINGS: The lungs are well aerated bilaterally without focal infiltrate, effusion or sizable parenchymal nodule. The thoracic inlet shows no acute abnormality. The thoracic aorta again demonstrates dilatation of the ascending portion. At the level of the main pulmonary artery it measures approximately 4.6 x 4.6 cm. It tapers in the arch and the descending thoracic aorta is within normal limits. No dissection is seen. Heavy calcification of the aortic arch is noted The pulmonary artery as visualized shows no large central pulmonary emboli. No hilar or mediastinal adenopathy is seen. Mild coronary calcifications are seen. Scanning into the upper abdomen reveals no acute abnormality. The osseous structures show no acute abnormality. Degenerative changes of the thoracic spine are seen. Review of the MIP images confirms the above findings. IMPRESSION: Dilatation of the thoracic aorta in its ascending component measuring 4.6 x 4.6 cm in greatest dimension. This is likely stable from the prior exam as some motion  artifact was seen on the prior study. No other focal abnormality is seen. Electronically Signed   By: Alcide Clever [name]   On: 01/26/2015 14:10   Ct Angio Chest Aorta W/cm &/or Wo/cm   06/04/2014   CLINICAL DATA:  Aortic aneurysm  EXAM: CT ANGIOGRAPHY CHEST WITH CONTRAST  TECHNIQUE: Multidetector CT imaging of the chest was performed using the standard protocol during bolus administration of intravenous contrast. Multiplanar CT image reconstructions and MIPs were obtained to evaluate the vascular anatomy.  CONTRAST:  75 cc Isovue 370  COMPARISON:  10/21/2013  FINDINGS: Maximal aortic diameters at the sinus of Valsalva, sino-tubular junction, and ascending aorta are 3.6 cm, 3.2 cm, and 4.9 cm respectively. Previous maximal diameter was 4.8 cm.  No evidence of aortic dissection or intramural hematoma. Moderate aortic valvular calcifications are noted.  Great vessels are patent within the confines of the study. Bilateral vertebral arteries are also patent.  No obvious evidence of acute pulmonary thromboembolism.  Minimal LAD territory coronary artery calcification. Trace left main coronary artery calcification.  No abnormal mediastinal adenopathy. Small mediastinal and bilateral axillary nodes.  Patchy ground-glass and airspace opacities in the lingula. There is a 2 mm nodule in the lingula on image 25.  No vertebral  compression deformity.  Review of the MIP images confirms the above findings.  IMPRESSION: Maximal diameter of the ascending aorta is 4.9 cm compare with 4.8 cm on the prior study. No evidence of dissection.  There are airspace and ground-glass opacities in the left upper lobe most consistent with an inflammatory process. There is also a 2 mm nodule. If the patient is at high risk for bronchogenic carcinoma, follow-up chest CT at 1 year is recommended. If the patient is at low risk, no follow-up is needed. This recommendation follows the consensus statement: Guidelines for Management of Small Pulmonary  Nodules Detected on CT Scans: A Statement from the Fleischner Society as published in Radiology 2005; 237:395-400.   Electronically Signed   By: Jolaine Click [name]   On: 06/04/2014 14:57    EXAM:  CT ANGIOGRAPHY CHEST WITH CONTRAST  TECHNIQUE:  Multidetector CT imaging of the chest was performed using the  standard protocol during bolus administration of intravenous  contrast. Multiplanar CT image reconstructions and MIPs were  obtained to evaluate the vascular anatomy.  CONTRAST: OMNIPAQUE IOHEXOL 350 MG/ML SOLN IV  COMPARISON: None  FINDINGS:  Fusiform aneurysmal dilatation ascending thoracic aorta 4.8 x 4.6 cm  image 35.  Aortic arch and descending thoracic aorta normal caliber.  Mild scattered atherosclerotic plaque formation thoracic aorta.  No evidence of aortic dissection or saccular aneurysm.  Plaque identified at origin of the celiac artery.  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  Pulmonary arteries well opacified and patent.  Visualized upper abdomen otherwise unremarkable.  No thoracic adenopathy.  Lungs clear.  No pleural effusion or pneumothorax.  Osseous structures unremarkable.  Review of the MIP images confirms the above findings.  IMPRESSION:  Fusiform aneurysmal dilatation ascending thoracic aorta measuring a  maximum of 4.8 x 4.6 cm.  Minimal scattered atherosclerotic calcifications.  Electronically Signed  By: Ulyses Southward [name]  On: 10/21/2013 09:50  No cardiac cath  Recent Lab Findings: Lab Results  Component Value Date   WBC 6.4 09/24/2015   HGB 13.7 09/24/2015   HCT 40.1 09/24/2015   PLT 249 09/24/2015   GLUCOSE 111 (H) 09/24/2015   CHOL 218 (H) 08/30/2015   TRIG 128 08/30/2015   HDL 49 08/30/2015   LDLCALC 143 (H) 08/30/2015   ALT 15 08/30/2015   AST 15 08/30/2015   NA 139 09/24/2015   K 3.1 (L) 09/24/2015   CL 102 09/24/2015   CREATININE 0.91 09/24/2015   BUN 16 09/24/2015   CO2 28  09/24/2015   INR 1.10 09/24/2015    ECHO7/2017 Study Conclusions  - Left ventricle: The cavity size was normal. Wall thickness was  increased in a pattern of moderate LVH. Systolic function was  normal. The estimated ejection fraction was in the range of 60%  to 65%. Wall motion was normal; there were no regional wall  motion abnormalities. Features are consistent with a pseudonormal  left ventricular filling pattern, with concomitant abnormal  relaxation and increased filling pressure (grade 2 diastolic  dysfunction). Doppler parameters are consistent with high  ventricular filling pressure. - Aortic valve: Moderately thickened, severely calcified leaflets.  There was moderate to severe stenosis. There was mild  regurgitation. Peak velocity (S): 390 cm/s. Mean gradient (S): 34  mm Hg. Valve area (VTI): 1.01 cm^2. Valve area (Vmax): 0.89 cm^2.  Valve area (Vmean): 0.93 cm^2. - Aorta: Ascending aortic diameter: 46 mm (S). - Ascending aorta: The ascending aorta was mild  to moderately  dilated. - Mitral valve: Calcified annulus. There was mild regurgitation. - Left atrium: The atrium was moderately dilated. - Right ventricle: Systolic function was mildly to moderately  reduced. - Tricuspid valve: There was mild-moderate regurgitation.  Transthoracic echocardiography. M-mode, complete 2D, spectral Doppler, and color Doppler. Birthdate: Patient birthdate: 1945-07-06. Age: Patient is 70 yr old. Sex: Gender: female. BMI: 42.2 kg/m^2. Blood pressure: 126/76 Patient status: Inpatient. Study date: Study date: 09/08/2015. Study time: 10:33 AM.  -------------------------------------------------------------------  ------------------------------------------------------------------- Left ventricle: The cavity size was normal. Wall thickness was increased in a pattern of moderate LVH. Systolic function was normal. The estimated ejection fraction was in the  range of 60% to 65%. Wall motion was normal; there were no regional wall motion abnormalities. Features are consistent with a pseudonormal left ventricular filling pattern, with concomitant abnormal relaxation and increased filling pressure (grade 2 diastolic dysfunction). Doppler parameters are consistent with high ventricular filling pressure.  ------------------------------------------------------------------- Aortic valve: Moderately thickened, severely calcified leaflets. Doppler: There was moderate to severe stenosis. There was mild regurgitation. VTI ratio of LVOT to aortic valve: 0.32. Valve area (VTI): 1.01 cm^2. Indexed valve area (VTI): 0.48 cm^2/m^2. Peak velocity ratio of LVOT to aortic valve: 0.28. Valve area (Vmax): 0.89 cm^2. Indexed valve area (Vmax): 0.42 cm^2/m^2. Mean velocity ratio of LVOT to aortic valve: 0.3. Valve area (Vmean): 0.93 cm^2. Indexed valve area (Vmean): 0.44 cm^2/m^2. Mean gradient (S): 34 mm Hg. Peak gradient (S): 61 mm Hg.  ------------------------------------------------------------------- Aorta: Aortic root: The aortic root was normal in size. Ascending aorta: The ascending aorta was mild to moderately dilated.  ------------------------------------------------------------------- Mitral valve: Calcified annulus. Doppler: There was mild regurgitation. Peak gradient (D): 5 mm Hg.  ------------------------------------------------------------------- Left atrium: The atrium was moderately dilated.  ------------------------------------------------------------------- Atrial septum: No defect or patent foramen ovale was identified.  ------------------------------------------------------------------- Right ventricle: The cavity size was normal. Systolic function was mildly to moderately reduced.  ------------------------------------------------------------------- Pulmonic valve: The valve appears to be grossly  normal. Doppler: There was trivial regurgitation.  ------------------------------------------------------------------- Tricuspid valve: The valve appears to be grossly normal. Doppler: There was mild-moderate regurgitation.  ------------------------------------------------------------------- Right atrium: The atrium was normal in size.  ------------------------------------------------------------------- Pericardium: There was no pericardial effusion.  ------------------------------------------------------------------- Systemic veins: Inferior vena cava: The vessel was normal in size. The respirophasic diameter changes were in the normal range (>= 50%), consistent with normal central venous pressure.  ------------------------------------------------------------------- Measurements  Left ventricle Value Reference LV ID, ED, PLAX chordal (L) 38.4 mm 43 - 52 LV ID, ES, PLAX chordal 24.8 mm 23 - 38 LV fx shortening, PLAX chordal 35 % >=29 LV PW thickness, ED 13.7 mm --------- IVS/LV PW ratio, ED 1.03 <=1.3 Stroke volume, 2D 100 ml --------- Stroke volume/bsa, 2D 48 ml/m^2 --------- LV ejection fraction, 1-p A4C 76 % --------- LV end-diastolic volume, 2-p 43 ml --------- LV end-systolic volume, 2-p 12 ml --------- LV ejection fraction, 2-p 72 % --------- Stroke volume, 2-p 31 ml --------- LV end-diastolic volume/bsa, 2-p 20 ml/m^2 --------- LV end-systolic volume/bsa, 2-p 6 ml/m^2 --------- Stroke volume/bsa, 2-p 14.6 ml/m^2 --------- LV e&',  lateral 9.09 cm/s --------- LV E/e&', lateral 11.99 --------- LV e&', medial 5.9 cm/s --------- LV E/e&', medial 18.47 --------- LV e&', average 7.5 cm/s --------- LV E/e&', average 14.54 --------- Longitudinal strain, TDI 21 % ---------  Ventricular septum Value Reference IVS thickness, ED 14.1 mm ---------  LVOT Value Reference LVOT ID, S 20 mm --------- LVOT area 3.14 cm^2 --------- LVOT ID 20 mm --------- LVOT peak velocity,  S 111 cm/s --------- LVOT mean velocity, S 80.8 cm/s --------- LVOT VTI, S 31.8 cm --------- Stroke volume (SV), LVOT DP 99.9 ml --------- Stroke index (SV/bsa), LVOT DP 47.7 ml/m^2 ---------  Aortic valve Value Reference Aortic valve peak velocity, S 390 cm/s --------- Aortic valve mean velocity, S 273 cm/s --------- Aortic valve VTI, S 98.8 cm --------- Aortic mean gradient, S 34 mm Hg --------- Aortic peak gradient, S 61 mm Hg --------- VTI ratio, LVOT/AV 0.32 --------- Aortic valve area, VTI 1.01 cm^2 --------- Aortic valve area/bsa, VTI 0.48 cm^2/m^2 --------- Velocity ratio, peak, LVOT/AV 0.28 --------- Aortic  valve area, peak velocity 0.89 cm^2 --------- Aortic valve area/bsa, peak 0.42 cm^2/m^2 --------- velocity Velocity ratio, mean, LVOT/AV 0.3 --------- Aortic valve area, mean velocity 0.93 cm^2 --------- Aortic valve area/bsa, mean 0.44 cm^2/m^2 --------- velocity Aortic regurg pressure half-time 779 ms ---------  Aorta Value Reference Aortic root ID, ED 35 mm --------- Ascending aorta ID, A-P, S 46 mm ---------  Left atrium Value Reference LA ID, A-P, ES 37 mm --------- LA ID/bsa, A-P 1.76 cm/m^2 <=2.2 LA volume, S 68.5 ml --------- LA volume/bsa, S 32.7 ml/m^2 --------- LA volume, ES, 1-p A4C 58.6 ml --------- LA volume/bsa, ES, 1-p A4C 28 ml/m^2 --------- LA volume, ES, 1-p A2C 71.5 ml --------- LA volume/bsa, ES, 1-p A2C 34.1 ml/m^2 ---------  Mitral valve Value Reference Mitral E-wave peak velocity 109 cm/s --------- Mitral A-wave peak velocity 76.2 cm/s --------- Mitral deceleration time 198 ms 150 - 230 Mitral peak gradient, D 5 mm Hg --------- Mitral E/A ratio, peak 1.4 ---------  Tricuspid valve Value Reference Tricuspid regurg peak velocity 268 cm/s --------- Tricuspid peak RV-RA gradient 29 mm Hg ---------  Right ventricle  Value Reference TAPSE 19.7 mm --------- RV s&', lateral, S 8 cm/s ---------  Pulmonic valve Value Reference Pulmonic valve peak velocity, S 62.6 cm/s ---------  Legend: (L) and (H) mark values outside specified reference range.  ------------------------------------------------------------------- Prepared and Electronically Authenticated by  Prentice Docker, MD 2017-07-05T16:06:02  CATH: Procedures   Right/Left Heart Cath and Coronary Angiography  Conclusion    The left ventricular systolic function is normal.  Nonobstructive CAD.  Normal PA pressures. CI 2.3; PA sat 68%.  Dilated ascending aorta.   Continue with plans for AVR and aneurysm repair.    Indications   Aortic stenosis [I35.0 (ICD-10-CM)]  Complications   Complications documented in old activity   The risks, benefits, and details of the procedure were explained to the patient. The patient verbalized understanding and wanted to proceed. Informed written consent was obtained.   PROCEDURE TECHNIQUE: After Xylocaine anesthesia, a 5 French sheath was placed in the right antecubital area. A 5 French balloontipped Swan-Ganz catheter was advanced to the pulmonary artery under fluoroscopic guidance. Hemodynamic pressures were obtained. Oxygen saturations were obtained. After Xylocaine anesthesia, a 1F sheath was placed in the right radial artery with a single anterior needle wall stick.  Left coronary angiography was done using a Judkins L4 guide catheter. Right coronary angiography was done using an AR1 guide catheter. Left heart cath was done using a pigtail catheter.    The aorti valve was crossed with an AL 1 catheter.   Contrast: 95   During this procedure the patient is administered a total of Versed 1 mg and Fentanyl 25 mg to achieve and maintain moderate  conscious sedation. The patient's heart rate, blood pressure, and oxygen saturation are monitored continuously during the procedure. The period of conscious sedation is 48 minutes, of which  I was present face-to-face 100% of this time.   Estimated blood loss <50 mL. There were no immediate complications during the procedure.      Coronary Findings   Dominance: Co-dominant  Left Anterior Descending  Mid LAD lesion, 25% stenosed.  Second Diagonal [employer] 2nd Diag to 2nd Diag lesion, 50% stenosed.  Right Heart   Right Heart Pressures LV EDP is normal.    Wall Motion              Left Heart   Left Ventricle The left ventricular size is normal. The left ventricular systolic function is normal. The left ventricular ejection fraction is 55-65% by visual estimate. There are no wall motion abnormalities in the left ventricle.    Aortic Valve There is moderate aortic valve stenosis.    Coronary Diagrams   Diagnostic Diagram     Implants     No implant documentation for this case.  PACS Images   Show images for Cardiac catheterization   Link to Procedure Log   Procedure Log    Hemo Data   Flowsheet Row Most Recent Value  Fick Cardiac Output 4.64 L/min  Fick Cardiac Output Index 2.38 (L/min)/BSA  Aortic Mean Gradient 27.8 mmHg  Aortic Peak Gradient 30 mmHg  Aortic Valve Area 0.91  Aortic Value Area Index 0.47 cm2/BSA  RA A Wave 10 mmHg  RA V Wave 8 mmHg  RA Mean 6 mmHg  RV Systolic Pressure 29 mmHg  RV Diastolic Pressure 4 mmHg  RV EDP 10 mmHg  PA Systolic Pressure 31 mmHg  PA Diastolic Pressure 11 mmHg  PA Mean 20 mmHg  PW A Wave 16 mmHg  PW V Wave 14 mmHg  PW Mean 11 mmHg  AO Systolic Pressure 101 mmHg  AO Diastolic Pressure 50 mmHg  AO Mean 69 mmHg  LV Systolic Pressure 138 mmHg  LV Diastolic Pressure 7 mmHg  LV EDP 18 mmHg  Arterial Occlusion Pressure Extended Systolic Pressure 112 mmHg  Arterial Occlusion Pressure Extended Diastolic Pressure 57  mmHg  Arterial Occlusion Pressure Extended Mean Pressure 80 mmHg  Left Ventricular Apex Extended Systolic Pressure 142 mmHg  Left Ventricular Apex Extended Diastolic Pressure 4 mmHg  Left Ventricular Apex Extended EDP Pressure 16 mmHg  QP/QS 1  TPVR Index 8.4 HRUI  TSVR Index 29 HRUI  PVR SVR Ratio 0.14  TPVR/TSVR Ratio 0.29     ECHO 09/2013 Study Conclusions  - Left ventricle: The cavity size was normal. Wall thickness was increased in a pattern of mild LVH. Systolic function was vigorous. The estimated ejection fraction was in the range of 65% to 70%. Wall motion was normal; there were no regional wall motion abnormalities. Doppler parameters are consistent with abnormal left ventricular relaxation (grade 1 diastolic dysfunction). - Aortic valve: Bicuspid; moderately calcified leaflets. Cusp separation was reduced. There was moderate to severe stenosis. There was mild regurgitation. Mean gradient (S): 21 mm Hg. LVOT/AV VTI ratio 0.32. Peak gradient (S): 40 mm Hg. Valve area (VTI): 0.93 cm^2. Valve area (Vmax): 0.96 cm^2. - Ascending aorta: The ascending aorta was moderately dilated. Approximately 46 mm. - Mitral valve: Calcified annulus. There was trivial regurgitation. - Right atrium: Central venous pressure (est): 3 mm Hg. - Atrial septum: No defect or patent foramen ovale was identified. - Tricuspid valve: There was trivial regurgitation. - Pulmonary arteries: PA peak pressure: 29 mm Hg (S). - Pericardium, extracardiac: There was no pericardial effusion  Peak velocity aortic valve 315 cm/sec  Study  Conclusions  - Left ventricle: The cavity size was normal. Systolic function was vigorous. The estimated ejection fraction was in the range of 65% to 70%. Wall motion was normal; there were no regional wall motion abnormalities. Features are consistent with a pseudonormal left ventricular filling pattern, with concomitant abnormal relaxation and increased filling  pressure (grade 2 diastolic dysfunction). Doppler parameters are consistent with high ventricular filling pressure. Mild to moderate concentric left ventricular hypertrophy. - Aortic valve: Mildly to moderately calcified annulus. Moderately thickened, severely calcified leaflets. Cusp separation was reduced. There was moderate to severe stenosis. There was mild regurgitation. Peak velocity (S): 364 cm/s. Mean gradient: 32 mmHg. Valve area (Vmax): 0.91 cm^2. - Aorta: Ascending aortic diameter: 43 mm (S). Mild ascending aortic dilatation. - Mitral valve: Mildly to moderately calcified annulus. There was mild regurgitation.  Impressions:  - When compared to the report dated 10/02/13, the mean aortic valve gradient has increased by 11 mmHg. Moderate to severe stenosis is seen. Aortic Size Index=    4.8    /Body surface area is 2.02 meters squared. = 2.38  < 2.75 cm/m2      4% risk per year 2.75 to 4.25          8% risk per year > 4.25 cm/m2    20% risk per year   Assessment / Plan:    Patient symptomatic  aortic stenosis, peak velocity across the aortic valve estimated at 390   Cm/s Mean  gradient (S): 34 mm Hg. Peak gradient (S): 61 mm Hg. and  probable bicuspid aortic valve with the aorta dilated to 4.7cm. She notes increasing symptoms of sob with activity.    The severity of her aortic stenosis has  Increasing  gradient  from previous echo. With increasing gradient and symptoms I have recommended to the patient proceeding with cardiac cath  And AVR  and replacement of Ascending  aorta.  Use of mechanical versus tissue valve is been discussed with the patient she prefers at her age of 47 a tissue valve and not having to take Coumadin.   Patient has had  follow-up cardiology appointment next week, she has a dental appointment in 2 weeks. I discussed with her the findings of recent  catheterization and reviewed with her her echocardiograms and CT scan. 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 I will plan to see her back in 4-5 weeks and we will readdress this, hopefully by that time she is also taking care of her dental issues.     The [employer] of [employer] [facility]) and the [place] Heart Association [facility]) have issued a statement to clarify 2 previous guidelines from the [facility], AHA, and collaborating societies addressing the risk of aortic dissection in patients with bicuspid aortic valves (BAV) and severe aortic enlargement. The 2 guidelines differ with regard to the recommended threshold of aortic root or ascending aortic dilatation that would justify surgical intervention in patients with bicuspid aortic valves. This new statement of clarification uses the ACC/AHA revised structure for delineating the Class of Recommendation and Level of Evidence to provide recommendation that replace those contained in Section 9.2.2.1 of the thoracic aortic disease guidelines and Section 5.1.3 of the valvular heart disease guideline. New recommendations in intervention in patients with BAV and dilatation of the aortic root (sinuses) or ascending aorta include:  . Operative intervention to  repair or replace the aortic root (sinuses) or replace the ascending aorta is indicated in asymptomatic patients with BAV if the diameter of the aortic root or ascending aorta is 5.5 cm or greater. (Class of recommendation 1, Level of evidence B-NR).  Marland Kitchen. Operative intervention to repair or replace the aortic root (sinuses) or replace the ascending aorta is reasonable in asymptomatic patients with BAV if the diameter of the aortic root or ascending aorta is 5.0 cm or greater and an additional risk factor for dissection is present or if the patient is at low surgical risk and the surgery is performed by an experienced aortic surgical team in a center with  established expertise in these procedures. (Class of recommendation IIa; Level of Evidence B-NR).  . Replacement of the ascending aorta is reasonable in patients with BAV undergoing AVR because of severe aortic stenosis or aortic regurgitation when the diameter of the ascending aorta is greater than 4.5 cm (Class of recommendation IIa; Level of evidence C-EO).  Citation: Gae BonHalperin JL, Kristen LoaderLevine GN, Anderson JL, et al. Surgery for aortic dilatation in patients with bicuspid aortic valves. A statement of clarification from the [employer]/American Heart Association Task Force on Clinical Practice Guidelines. [Published online ahead of print February 06, 2014]. Circulation. doi: 10.1161/CIR.0000000000000331.   Delight OvensEdward B Cobey Raineri MD      [address] [place] [id number] Office [phone]   Beeper [phone]  11/04/2015 2:47 PM

## 2015-11-30 ENCOUNTER — Ambulatory Visit (INDEPENDENT_AMBULATORY_CARE_PROVIDER_SITE_OTHER): Payer: Medicare Other | Admitting: Nurse Practitioner

## 2015-11-30 ENCOUNTER — Encounter: Payer: Self-pay | Admitting: Nurse Practitioner

## 2015-11-30 VITALS — BP 132/65 | HR 64 | Temp 97.3°F | Ht 60.0 in | Wt 216.0 lb

## 2015-11-30 DIAGNOSIS — I10 Essential (primary) hypertension: Secondary | ICD-10-CM | POA: Diagnosis not present

## 2015-11-30 DIAGNOSIS — Q231 Congenital insufficiency of aortic valve: Secondary | ICD-10-CM

## 2015-11-30 DIAGNOSIS — I350 Nonrheumatic aortic (valve) stenosis: Secondary | ICD-10-CM | POA: Diagnosis not present

## 2015-11-30 DIAGNOSIS — E785 Hyperlipidemia, unspecified: Secondary | ICD-10-CM | POA: Diagnosis not present

## 2015-11-30 DIAGNOSIS — Q230 Congenital stenosis of aortic valve: Secondary | ICD-10-CM

## 2015-11-30 LAB — CMP14+EGFR
ALBUMIN: 3.8 g/dL (ref 3.6–4.8)
ALT: 13 IU/L (ref 0–32)
AST: 16 IU/L (ref 0–40)
Albumin/Globulin Ratio: 1.5 (ref 1.2–2.2)
Alkaline Phosphatase: 57 IU/L (ref 39–117)
BILIRUBIN TOTAL: 0.3 mg/dL (ref 0.0–1.2)
BUN / CREAT RATIO: 20 (ref 12–28)
BUN: 15 mg/dL (ref 8–27)
CALCIUM: 9.2 mg/dL (ref 8.7–10.3)
CHLORIDE: 101 mmol/L (ref 96–106)
CO2: 28 mmol/L (ref 18–29)
CREATININE: 0.76 mg/dL (ref 0.57–1.00)
GFR calc non Af Amer: 80 mL/min/{1.73_m2} (ref >59)
GFR, EST AFRICAN AMERICAN: 93 mL/min/{1.73_m2} (ref >59)
GLUCOSE: 102 mg/dL — AB (ref 65–99)
Globulin, Total: 2.6 g/dL (ref 1.5–4.5)
Potassium: 4 mmol/L (ref 3.5–5.2)
SODIUM: 143 mmol/L (ref 134–144)
TOTAL PROTEIN: 6.4 g/dL (ref 6.0–8.5)

## 2015-11-30 LAB — LIPID PANEL
Chol/HDL Ratio: 4.3 ratio units (ref 0.0–4.4)
Cholesterol, Total: 211 mg/dL — ABNORMAL HIGH (ref 100–199)
HDL: 49 mg/dL (ref >39)
LDL CALC: 139 mg/dL — AB (ref 0–99)
Triglycerides: 114 mg/dL (ref 0–149)
VLDL CHOLESTEROL CAL: 23 mg/dL (ref 5–40)

## 2015-11-30 NOTE — Patient Instructions (Signed)
American Heart Association (AHA) Exercise Recommendation  Being physically active is important to prevent heart disease and stroke, the nation's No. 1and No. 5killers. To improve overall cardiovascular health, we suggest at least 150 minutes per week of moderate exercise or 75 minutes per week of vigorous exercise (or a combination of moderate and vigorous activity). Thirty minutes a day, five times a week is an easy goal to remember. You will also experience benefits even if you divide your time into two or three segments of 10 to 15 minutes per day.  For people who would benefit from lowering their blood pressure or cholesterol, we recommend 40 minutes of aerobic exercise of moderate to vigorous intensity three to four times a week to lower the risk for heart attack and stroke.  Physical activity is anything that makes you move your body and burn calories.  This includes things like climbing stairs or playing sports. Aerobic exercises benefit your heart, and include walking, jogging, swimming or biking. Strength and stretching exercises are best for overall stamina and flexibility.  The simplest, positive change you can make to effectively improve your heart health is to start walking. It's enjoyable, free, easy, social and great exercise. A walking program is flexible and boasts high success rates because people can stick with it. It's easy for walking to become a regular and satisfying part of life.   For Overall Cardiovascular Health:  At least 30 minutes of moderate-intensity aerobic activity at least 5 days per week for a total of 150  OR   At least 25 minutes of vigorous aerobic activity at least 3 days per week for a total of 75 minutes; or a combination of moderate- and vigorous-intensity aerobic activity  AND   Moderate- to high-intensity muscle-strengthening activity at least 2 days per week for additional health benefits.  For Lowering Blood Pressure and Cholesterol  An  average 40 minutes of moderate- to vigorous-intensity aerobic activity 3 or 4 times per week  What if I can't make it to the time goal? Something is always better than nothing! And everyone has to start somewhere. Even if you've been sedentary for years, today is the day you can begin to make healthy changes in your life. If you don't think you'll make it for 30 or 40 minutes, set a reachable goal for today. You can work up toward your overall goal by increasing your time as you get stronger. Don't let all-or-nothing thinking rob you of doing what you can every day.  Source:http://www.heart.org    

## 2015-11-30 NOTE — Progress Notes (Signed)
Subjective:    Patient ID: Angela Dawson, female    DOB: [birth year], 70 y.o.   MRN: [id number]  Patient here today for follow up of chronic medical problems. She was last seen in 3 months ago. She scheduled for an open heart surgery for aortic aneurysm repair  at [facility] in 2 months (sometime in November). She endorse occasional shortness of breath, denies chest pain. She wants a flu shot today.  Outpatient Encounter Prescriptions as of 08/30/2015  Medication Sig  . aspirin 81 MG tablet Take 81 mg by mouth daily.  Marland Kitchen atenolol (TENORMIN) 25 MG tablet TAKE 1 TABLET EVERY DAY  . hydrochlorothiazide (HYDRODIURIL) 25 MG tablet Take 1 tablet (25 mg total) by mouth daily.  Marland Kitchen ibuprofen (ADVIL,MOTRIN) 200 MG tablet Take 200 mg by mouth every 6 (six) hours as needed.  Marland Kitchen lisinopril (PRINIVIL,ZESTRIL) 20 MG tablet Take 1 tablet (20 mg total) by mouth daily.    Hypertension  This is a chronic problem. The current episode started more than 1 year ago. The problem is unchanged. The problem is controlled. Pertinent negatives include no chest pain, headaches or shortness of breath. Risk factors for coronary artery disease include dyslipidemia and post-menopausal state. Past treatments include ACE inhibitors and diuretics. The current treatment provides moderate improvement. Compliance problems include diet and exercise.   Hyperlipidemia  This is a chronic problem. The current episode started more than 1 year ago. The problem is controlled. Exacerbating diseases include obesity. Pertinent negatives include no chest pain or shortness of breath. Treatments tried: cannot toleratae statin. The current treatment provides moderate improvement of lipids. There are no compliance problems.  Risk factors for coronary artery disease include dyslipidemia, family history, hypertension, obesity and post-menopausal.  aortic dilitation/aortic stenosis due to bicuspid aortic valve Keep yearly follow up with cardiology. Has open  heart surgery scheduled in 2 months, with Dr. Servando Snare. C/O left hip pain-started several years ago- pain is tolerable, rates pain about 2 to 3 out 10.Standing and walking increases pain- ibuprofen helps with pain.    Review of Systems  Constitutional: Negative.   HENT: Negative.   Respiratory: Negative for shortness of breath.   Cardiovascular: Negative for chest pain.  Genitourinary: Negative.   Neurological: Negative for headaches.  Psychiatric/Behavioral: Negative.   All other systems reviewed and are negative.      Objective:   Physical Exam  Constitutional: She is oriented to person, place, and time. She appears well-developed and well-nourished.  HENT:  Nose: Nose normal.  Mouth/Throat: Oropharynx is clear and moist.  Eyes: EOM are normal.  Neck: Trachea normal, normal range of motion and full passive range of motion without pain. Neck supple. No JVD present. Carotid bruit is not present. No thyromegaly present.  Cardiovascular: Normal rate, regular rhythm and intact distal pulses.  Exam reveals no gallop and no friction rub.   Murmur (1/6 systolic murmur) heard. Pulmonary/Chest: Effort normal and breath sounds normal.  Abdominal: Soft. Bowel sounds are normal. She exhibits no distension and no mass. There is no tenderness.  Musculoskeletal: Normal range of motion.  Lymphadenopathy:    She has no cervical adenopathy.  Neurological: She is alert and oriented to person, place, and time. She has normal reflexes.  Skin: Skin is warm and dry.  Psychiatric: She has a normal mood and affect. Her behavior is normal. Judgment and thought content normal.   BP 132/65   Pulse 64   Temp 97.3 F (36.3 C) (Oral)   Ht 5' (1.524  m)   Wt 216 lb (98 kg)   BMI 42.18 kg/m       Assessment & Plan:  1. Essential hypertension Low salt diet - CMP14+EGFR  2. Aortic stenosis due to bicuspid aortic valve Keep appointments with cardiologist  3. Aortic stenosis Keep appointments with  cardiologist  4. Hyperlipidemia Low fat diet - Lipid panel  5. Morbid obesity, unspecified obesity type (K[place]) Weight loss and AHA exercise recommendation discussed   Flu shot given today Labs pending Health maintenance reviewed Diet and exercise encouraged Continue all meds Follow up  in 3 months   [place], FNP

## 2015-12-01 ENCOUNTER — Ambulatory Visit (INDEPENDENT_AMBULATORY_CARE_PROVIDER_SITE_OTHER): Payer: Medicare Other

## 2015-12-01 DIAGNOSIS — Z23 Encounter for immunization: Secondary | ICD-10-CM

## 2015-12-20 ENCOUNTER — Ambulatory Visit (INDEPENDENT_AMBULATORY_CARE_PROVIDER_SITE_OTHER): Payer: Medicare Other | Admitting: Cardiology

## 2015-12-20 ENCOUNTER — Encounter: Payer: Self-pay | Admitting: Cardiology

## 2015-12-20 VITALS — BP 91/61 | HR 50 | Ht 60.0 in | Wt 216.6 lb

## 2015-12-20 DIAGNOSIS — I712 Thoracic aortic aneurysm, without rupture: Secondary | ICD-10-CM | POA: Diagnosis not present

## 2015-12-20 DIAGNOSIS — I350 Nonrheumatic aortic (valve) stenosis: Secondary | ICD-10-CM

## 2015-12-20 DIAGNOSIS — Q231 Congenital insufficiency of aortic valve: Secondary | ICD-10-CM | POA: Diagnosis not present

## 2015-12-20 DIAGNOSIS — I7121 Aneurysm of the ascending aorta, without rupture: Secondary | ICD-10-CM

## 2015-12-20 NOTE — Patient Instructions (Signed)

## 2015-12-20 NOTE — Progress Notes (Signed)
Clinical Summary Angela Dawson is a 70 y.o.female seen today for follow up of the following medical problems.   1. Aortic stenosis with bicuspid AV and ascending aortic aneurysm.  - 09/2013 echo showed LVEF 65-70%, grade I diastolic dysfunction. Bicuspid AV with moderate to severe AS (mean grad 21, dimensionless index 0.32, valve area 0.93), mild AI. Described mild aorta dilatation from limited views, ascending aorta 4.6 cm. - CTA chest 05/2014 with asscending thoracic aortic aneurysm 4.9 cm.  01/2015 CTA chest ascending aortic aneurysm 4.6 x 4.6 cm. Forllowed by Dr Tyrone Sage.  -06/2014 echo LVEF 65-70%, AVA area 0.91, mean grad 32 - 09/2015 echo LVEF 60-65%, mod to severe AS mean grad 34, AVA 1 - since last visit she completed cath which showed no significant CAD   - she denies any recent chest pain. Stable DOE at 1/2 block. Shorter distance with incline.  - she completed dental workup. Has f/u early November with CT surgery to discuss timing for surgery.    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 Allergy    seasonal  . Chicken pox   . Hypertension   . Measles      Allergies  Allergen Reactions  . Crestor [Rosuvastatin]     Dizziness  . Lipitor [Atorvastatin]     Unable to stand or move  . Statins     Leg cramps   . Penicillins Rash    Has patient had a PCN reaction causing immediate rash, facial/tongue/throat swelling, SOB or lightheadedness with hypotension: Yes Has patient had a PCN reaction causing severe rash involving mucus membranes or skin necrosis: No Has patient had a PCN reaction that required hospitalization No Has patient had a PCN reaction occurring within the last 10 years: No If all of the above answers are "NO", then may proceed with Cephalosporin use.      Current Outpatient Prescriptions  Medication Sig Dispense Refill  . aspirin 81 MG tablet Take 81 mg by mouth daily.    Marland Kitchen  atenolol (TENORMIN) 25 MG tablet Take 0.5 tablets (12.5 mg total) by mouth daily. 45 tablet 3  . cetirizine (ZYRTEC) 10 MG tablet Take 10 mg by mouth daily.    . hydrochlorothiazide (HYDRODIURIL) 25 MG tablet TAKE 1 TABLET EVERY DAY 90 tablet 3  . ibuprofen (ADVIL,MOTRIN) 200 MG tablet Take 400 mg by mouth as needed.     Marland Kitchen lisinopril (PRINIVIL,ZESTRIL) 20 MG tablet TAKE 1 TABLET EVERY DAY 90 tablet 3   No current facility-administered medications for this visit.      Past Surgical History:  Procedure Laterality Date  . CARDIAC CATHETERIZATION N/A 09/24/2015   Procedure: Right/Left Heart Cath and Coronary Angiography;  Surgeon: Corky Crafts, MD;  Location[facility] INVASIVE CV LAB;  Service: Cardiovascular;  Laterality: N/A;  . TUBAL LIGATION       Allergies  Allergen Reactions  . Crestor [Rosuvastatin]     Dizziness  . Lipitor [Atorvastatin]     Unable to stand or move  . Statins     Leg cramps   . Penicillins Rash    Has patient had a PCN reaction causing immediate rash, facial/tongue/throat swelling, SOB or lightheadedness with hypotension: Yes Has patient had a PCN reaction causing severe rash involving mucus membranes or skin necrosis: No Has patient had a PCN reaction that required hospitalization No Has patient had a PCN reaction occurring within the  last 10 years: No If all of the above answers are "NO", then may proceed with Cephalosporin use.       Family History  Problem Relation Age of Onset  . Alzheimer's disease Mother   . Heart disease Mother   . Heart attack Father 53     Social History Angela Dawson reports that she quit smoking about 42 years ago. Her smoking use included Cigarettes. She started smoking about 47 years ago. She has a 2.50 pack-year smoking history. She has never used smokeless tobacco. Angela Dawson reports that she does not drink alcohol.   Review of Systems CONSTITUTIONAL: No weight loss, fever, chills, weakness or fatigue.  HEENT:  Eyes: No visual loss, blurred vision, double vision or yellow sclerae.No hearing loss, sneezing, congestion, runny nose or sore throat.  SKIN: No rash or itching.  CARDIOVASCULAR: per HPI RESPIRATORY: No shortness of breath, cough or sputum.  GASTROINTESTINAL: No anorexia, nausea, vomiting or diarrhea. No abdominal pain or blood.  GENITOURINARY: No burning on urination, no polyuria NEUROLOGICAL: No headache, dizziness, syncope, paralysis, ataxia, numbness or tingling in the extremities. No change in bowel or bladder control.  MUSCULOSKELETAL: No muscle, back pain, joint pain or stiffness.  LYMPHATICS: No enlarged nodes. No history of splenectomy.  PSYCHIATRIC: No history of depression or anxiety.  ENDOCRINOLOGIC: No reports of sweating, cold or heat intolerance. No polyuria or polydipsia.  Marland Kitchen   Physical Examination Vitals:   12/20/15 1115  BP: 91/61  Pulse: (!) 50   Vitals:   12/20/15 1115  Weight: 216 lb 9.6 oz (98.2 kg)  Height: 5' (1.524 m)    Gen: resting comfortably, no acute distress HEENT: no scleral icterus, pupils equal round and reactive, no palptable cervical adenopathy,  CV: RRR, 3/6 systolic murmur RUSB, no jvd Resp: Clear to auscultation bilaterally GI: abdomen is soft, non-tender, non-distended, normal bowel sounds, no hepatosplenomegaly MSK: extremities are warm, no edema.  Skin: warm, no rash Neuro:  no focal deficits Psych: appropriate affect   Diagnostic Studies 09/2013 Echo Study Conclusions  - Left ventricle: The cavity size was normal. Wall thickness was increased in a pattern of mild LVH. Systolic function was vigorous. The estimated ejection fraction was in the range of 65% to 70%. Wall motion was normal; there were no regional wall motion abnormalities. Doppler parameters are consistent with abnormal left ventricular relaxation (grade 1 diastolic dysfunction). - Aortic valve: Bicuspid; moderately calcified leaflets. Cusp separation was reduced.  There was moderate to severe stenosis. There was mild regurgitation. Mean gradient (S): 21 mm Hg. LVOT/AV VTI ratio 0.32. Peak gradient (S): 40 mm Hg. Valve area (VTI): 0.93 cm^2. Valve area (Vmax): 0.96 cm^2. - Ascending aorta: The ascending aorta was moderately dilated. Approximately 46 mm. - Mitral valve: Calcified annulus. There was trivial regurgitation. - Right atrium: Central venous pressure (est): 3 mm Hg. - Atrial septum: No defect or patent foramen ovale was identified. - Tricuspid valve: There was trivial regurgitation. - Pulmonary arteries: PA peak pressure: 29 mm Hg (S). - Pericardium, extracardiac: There was no pericardial effusion.  Impressions:  - Mild LVH with LVEF 65-70%, grade 1 diastolic dysfunction. Moderate to severe aortic stenosis as outlined with bicuspid aortic valve and mild aortic regurgitation. There is also at least moderate dilatation of the ascending aorta based on limited views. Given association of aortic aneurysmal disease with bicuspid aortic valve, would suggest further dedicated imaging of the thoracic aorta with CTA or MRA. Normal PASP 29 mmHg.   05/2014 CTA chest IMPRESSION:  Maximal diameter of the ascending aorta is 4.9 cm compare with 4.8 cm on the prior study. No evidence of dissection.  There are airspace and ground-glass opacities in the left upper lobe most consistent with an inflammatory process. There is also a 2 mm nodule. If the patient is at high risk for bronchogenic carcinoma, follow-up chest CT at 1 year is recommended. If the patient is at low risk, no follow-up is needed. This recommendation follows the consensus statement: Guidelines for Management of Small Pulmonary Nodules Detected on CT Scans: A Statement from the Fleischner Society as published in Radiology 2005; 237:395-400.   ADDENDUM: The patient therefore has a thoracic aortic aneurysm that has only increased from 4.8 cm to 4.9 cm since August of last year.  Ascending thoracic aortic aneurysm. Recommend semi-annual imaging followup by CTA or MRA and referral to cardiothoracic surgery if not already obtained. This recommendation follows 2010 ACCF/AHA/AATS/ACR/ASA/SCA/SCAI/SIR/STS/SVM Guidelines for the Diagnosis and Management of Patients With Thoracic Aortic Disease. Circulation. 2010; 121: [id number]   06/2014 Echo Study Conclusions  - Left ventricle: The cavity size was normal. Systolic function was vigorous. The estimated ejection fraction was in the range of 65% to 70%. Wall motion was normal; there were no regional wall motion abnormalities. Features are consistent with a pseudonormal left ventricular filling pattern, with concomitant abnormal relaxation and increased filling pressure (grade 2 diastolic dysfunction). Doppler parameters are consistent with high ventricular filling pressure. Mild to moderate concentric left ventricular hypertrophy. - Aortic valve: Mildly to moderately calcified annulus. Moderately thickened, severely calcified leaflets. Cusp separation was reduced. There was moderate to severe stenosis. There was mild regurgitation. Peak velocity (S): 364 cm/s. Mean gradient: 32 mmHg. Valve area (Vmax): 0.91 cm^2. - Aorta: Ascending aortic diameter: 43 mm (S). Mild ascending aortic dilatation. - Mitral valve: Mildly to moderately calcified annulus. There was mild regurgitation.  Impressions:  - When compared to the report dated 10/02/13, the mean aortic valve gradient has increased by 11 mmHg. Moderate to severe stenosis is seen.  01/2015 CTA chest IMPRESSION: Dilatation of the thoracic aorta in its ascending component measuring 4.6 x 4.6 cm in greatest dimension. This is likely stable from the prior exam as some motion artifact was seen on the prior study.  09/2015 echo Study Conclusions  - Left ventricle: The cavity size was normal. Wall thickness was  increased in a  pattern of moderate LVH. Systolic function was  normal. The estimated ejection fraction was in the range of 60%  to 65%. Wall motion was normal; there were no regional wall  motion abnormalities. Features are consistent with a pseudonormal  left ventricular filling pattern, with concomitant abnormal  relaxation and increased filling pressure (grade 2 diastolic  dysfunction). Doppler parameters are consistent with high  ventricular filling pressure. - Aortic valve: Moderately thickened, severely calcified leaflets.  There was moderate to severe stenosis. There was mild  regurgitation. Peak velocity (S): 390 cm/s. Mean gradient (S): 34  mm Hg. Valve area (VTI): 1.01 cm^2. Valve area (Vmax): 0.89 cm^2.  Valve area (Vmean): 0.93 cm^2. - Aorta: Ascending aortic diameter: 46 mm (S). - Ascending aorta: The ascending aorta was mild to moderately  dilated. - Mitral valve: Calcified annulus. There was mild regurgitation. - Left atrium: The atrium was moderately dilated. - Right ventricle: Systolic function was mildly to moderately  reduced. - Tricuspid valve: There was mild-moderate regurgitation.  09/2015 Cath  The left ventricular systolic function is normal.  Nonobstructive CAD.  Normal PA pressures. CI 2.3; PA  sat 68%.  Dilated ascending aorta.  Continue with plans for AVR and aneurysm repair    Assessment and Plan  1. Bicuspid aortic valve with aortic stenosis and aortic aneurysm - stable DOE. She is working with CT surgery to arrange timing of surgery, she had asked to postpone initially due to her work schedule. She has f/u with CT surgery in early November    F/u 4 months    Antoine Poche, [name]

## 2015-12-30 ENCOUNTER — Ambulatory Visit: Payer: Medicare Other | Admitting: Cardiothoracic Surgery

## 2016-01-06 ENCOUNTER — Encounter: Payer: Self-pay | Admitting: Cardiothoracic Surgery

## 2016-01-06 ENCOUNTER — Other Ambulatory Visit: Payer: Self-pay | Admitting: *Deleted

## 2016-01-06 ENCOUNTER — Ambulatory Visit (INDEPENDENT_AMBULATORY_CARE_PROVIDER_SITE_OTHER): Payer: Medicare Other | Admitting: Cardiothoracic Surgery

## 2016-01-06 VITALS — BP 123/66 | HR 53 | Resp 16 | Ht 60.0 in | Wt 216.0 lb

## 2016-01-06 DIAGNOSIS — I712 Thoracic aortic aneurysm, without rupture: Secondary | ICD-10-CM

## 2016-01-06 DIAGNOSIS — I7121 Aneurysm of the ascending aorta, without rupture: Secondary | ICD-10-CM

## 2016-01-06 DIAGNOSIS — I77810 Thoracic aortic ectasia: Secondary | ICD-10-CM

## 2016-01-06 DIAGNOSIS — Q231 Congenital insufficiency of aortic valve: Secondary | ICD-10-CM | POA: Diagnosis not present

## 2016-01-06 DIAGNOSIS — I350 Nonrheumatic aortic (valve) stenosis: Secondary | ICD-10-CM

## 2016-01-06 DIAGNOSIS — Z01818 Encounter for other preprocedural examination: Secondary | ICD-10-CM

## 2016-01-06 DIAGNOSIS — Q230 Congenital stenosis of aortic valve: Secondary | ICD-10-CM

## 2016-01-06 NOTE — Progress Notes (Signed)
[address]       [name] [address]             [phone]                    [name] [facility] Medical Record #[id number] Date of Birth: 10-01-1945  Referring: Antoine Poche, MD Primary Care: Bennie Pierini, FNP  Chief Complaint:    Chief Complaint  Patient presents with  . Follow-up    to discuss surgery after cardiology visit and dental clearance    History of Present Illness:    Angela Dawson 70 y.o. female followed in the office for as and dilated ascending aorta with  bicuspid aortic valve. The patient was  asymptomatic from a cardiac standpoint.  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  She notes symptoms walking across parking lot to come to appointment .  She denies  PND, pedal edema, syncope, presyncope..   Other than hypertension the patient has no previous history of cardiac disease  She does have a family history of cardiac disease her father died of myocardial infarction age 26   No family history of aortic dissection  She has seen her dentist and dental work all done.   Current Activity/ Functional Status:  Patient is independent with mobility/ambulation, transfers, ADL's, IADL's.   Zubrod Score: At the time of surgery this patient's most appropriate activity status/level should be described as: []     0    Normal activity, no symptoms [x]     1    Restricted in physical strenuous activity but ambulatory, able to do out light work []     2    Ambulatory and capable of self care, unable to do work activities, up and about               >50 % of waking hours                              []     3    Only limited self care, in bed greater than 50% of waking hours []     4    Completely disabled, no self care, confined to bed or chair []     5    Moribund   Past Medical History:  Diagnosis Date  . Allergy    seasonal  . Chicken pox   . Hypertension   . Measles     Past Surgical History:  Procedure Laterality Date  . CARDIAC CATHETERIZATION N/A 09/24/2015   Procedure: Right/Left Heart Cath and Coronary Angiography;  Surgeon: Corky Crafts, MD;  Location: [facility] INVASIVE CV LAB;  Service: Cardiovascular;  Laterality: N/A;  . TUBAL LIGATION      Family History  Problem Relation Age of Onset  . Alzheimer's disease Mother   . Heart disease Mother   . Heart attack Father 108      History  Smoking Status  . Former Smoker  . Packs/day: 0.50  . Years: 5.00  . Types: Cigarettes  . Start date: 08/04/1968  . Quit date: 10/10/1973  Smokeless Tobacco  . Never Used    Comment: smoked less than 1 pack every 2 weeks. Havent smoked in atleast 40 years    History  Alcohol Use No  Allergies  Allergen Reactions  . Crestor [Rosuvastatin]     Dizziness  . Lipitor [Atorvastatin]     Unable to stand or move  . Statins     Leg cramps   . Penicillins Rash    Has patient had a PCN reaction causing immediate rash, facial/tongue/throat swelling, SOB or lightheadedness with hypotension: Yes Has patient had a PCN reaction causing severe rash involving mucus membranes or skin necrosis: No Has patient had a PCN reaction that required hospitalization No Has patient had a PCN reaction occurring within the last 10 years: No If all of the above answers are "NO", then may proceed with Cephalosporin use.     Current Outpatient Prescriptions  Medication Sig Dispense Refill  . aspirin 81 MG tablet Take 81 mg by mouth daily.    Marland Kitchen atenolol (TENORMIN) 25 MG tablet Take 0.5 tablets (12.5 mg total) by mouth daily. 45 tablet 3  . cetirizine (ZYRTEC) 10 MG tablet Take 10 mg by mouth daily.    . hydrochlorothiazide (HYDRODIURIL) 25 MG tablet TAKE 1 TABLET EVERY DAY 90 tablet 3  . ibuprofen (ADVIL,MOTRIN) 200 MG tablet Take 400 mg by mouth as needed.     Marland Kitchen lisinopril (PRINIVIL,ZESTRIL) 20 MG tablet TAKE 1  TABLET EVERY DAY 90 tablet 3   No current facility-administered medications for this visit.      Review of Systems:     Cardiac Review of Systems: Y or N  Chest Pain [ n   ]  Resting SOB [n   ] Exertional SOB  [ n ]  Orthopnea [n  ]   Pedal Edema [ n  ]    Palpitations [n  ] Syncope  Milo.Brash  ]   Presyncope [ n  ]  General Review of Systems: [Y] = yes [  ]=no Constitional: recent weight change [  ];  Wt loss over the last 3 months [  n ] anorexia [  ]; fatigue [n  ]; nausea [  ]; night sweats [  ]; fever [  ]; or chills [  ];          Dental: poor dentition[  ]; Last Dentist visit: last month  Eye : blurred vision [ n ]; diplopia [  n ]; vision changes [ n ];  Amaurosis fugax[n  ]; Resp: cough [  ];  wheezing[n  ];  hemoptysis[n  ]; shortness of breath[ n ]; paroxysmal nocturnal dyspnea[  ]; dyspnea on exertion[  ]; or orthopnea[  ];  GI:  gallstones[  ], vomiting[  ];  dysphagia[  ]; melena[  ];  hematochezia [  ]; heartburn[  ];   Hx of  Colonoscopy[  ]; GU: kidney stones [  ]; hematuria[  ];   dysuria [  ];  nocturia[  ];  history of     obstruction [  ]; urinary frequency [  ]             Skin: rash, swelling[  ];, hair loss[  ];  peripheral edema[  ];  or itching[  ]; Musculosketetal: myalgias[  ];  joint swelling[  ];  joint erythema[  ];  joint pain[  ];  back pain[  ];  Heme/Lymph: bruising[  ];  bleeding[  ];  anemia[  ];  Neuro: TIA[n  ];  headaches[  ];  stroke[  ];  vertigo[  ];  seizures[  ];   paresthesias[ n ];  difficulty walking[n  ];  Psych:depression[  n ]; anxiety[  ];  Endocrine: diabetes[  ];  thyroid dysfunction[  ];  Immunizations: Flu up to date Cove.Etienne  ]; Pneumococcal up to date Cove.Etienne  ];  Other:  Physical Exam: BP 123/66 (BP Location: Left Arm, Patient Position: Sitting, Cuff Size: Large)   Pulse (!) 53   Resp 16   Ht 5' (1.524 m)   Wt 216 lb (98 kg)   SpO2 96% Comment: ON RA  BMI 42.18 kg/m   PHYSICAL EXAMINATION:  General appearance: alert, cooperative, appears  stated age and no distress Neurologic: intact Heart: systolic murmur: early systolic 3/6, crescendo at 2nd right intercostal space Lungs: clear to auscultation bilaterally Abdomen: soft, non-tender; bowel sounds normal; no masses,  no organomegaly Extremities: extremities normal, atraumatic, no cyanosis or edema and Homans sign is negative, no sign of DVT Patient has no carotid bruits, has full and equal brachial radial and pedal pulses Has no cervical or supraclavicular adenopathy  Diagnostic Studies & Laboratory data:     Recent Radiology Findings:  Ct Angio Chest Aorta W/cm &/or Wo/cm  01/26/2015  CLINICAL DATA:  Followup thoracic ascending aorta aneurysm EXAM: CT ANGIOGRAPHY CHEST WITH CONTRAST TECHNIQUE: Multidetector CT imaging of the chest was performed using the standard protocol during bolus administration of intravenous contrast. Multiplanar CT image reconstructions and MIPs were obtained to evaluate the vascular anatomy. CONTRAST:  75 mL Isovue 370 COMPARISON:  06/04/2014 FINDINGS: The lungs are well aerated bilaterally without focal infiltrate, effusion or sizable parenchymal nodule. The thoracic inlet shows no acute abnormality. The thoracic aorta again demonstrates dilatation of the ascending portion. At the level of the main pulmonary artery it measures approximately 4.6 x 4.6 cm. It tapers in the arch and the descending thoracic aorta is within normal limits. No dissection is seen. Heavy calcification of the aortic arch is noted The pulmonary artery as visualized shows no large central pulmonary emboli. No hilar or mediastinal adenopathy is seen. Mild coronary calcifications are seen. Scanning into the upper abdomen reveals no acute abnormality. The osseous structures show no acute abnormality. Degenerative changes of the thoracic spine are seen. Review of the MIP images confirms the above findings. IMPRESSION: Dilatation of the thoracic aorta in its ascending component measuring 4.6 x  4.6 cm in greatest dimension. This is likely stable from the prior exam as some motion artifact was seen on the prior study. No other focal abnormality is seen. Electronically Signed   By: Alcide Clever [name]   On: 01/26/2015 14:10   Ct Angio Chest Aorta W/cm &/or Wo/cm   06/04/2014   CLINICAL DATA:  Aortic aneurysm  EXAM: CT ANGIOGRAPHY CHEST WITH CONTRAST  TECHNIQUE: Multidetector CT imaging of the chest was performed using the standard protocol during bolus administration of intravenous contrast. Multiplanar CT image reconstructions and MIPs were obtained to evaluate the vascular anatomy.  CONTRAST:  75 cc Isovue 370  COMPARISON:  10/21/2013  FINDINGS: Maximal aortic diameters at the sinus of Valsalva, sino-tubular junction, and ascending aorta are 3.6 cm, 3.2 cm, and 4.9 cm respectively. Previous maximal diameter was 4.8 cm.  No evidence of aortic dissection or intramural hematoma. Moderate aortic valvular calcifications are noted.  Great vessels are patent within the confines of the study. Bilateral vertebral arteries are also patent.  No obvious evidence of acute pulmonary thromboembolism.  Minimal LAD territory coronary artery calcification. Trace left main coronary artery calcification.  No abnormal mediastinal adenopathy. Small mediastinal and bilateral axillary nodes.  Patchy ground-glass and airspace opacities in  the lingula. There is a 2 mm nodule in the lingula on image 25.  No vertebral compression deformity.  Review of the MIP images confirms the above findings.  IMPRESSION: Maximal diameter of the ascending aorta is 4.9 cm compare with 4.8 cm on the prior study. No evidence of dissection.  There are airspace and ground-glass opacities in the left upper lobe most consistent with an inflammatory process. There is also a 2 mm nodule. If the patient is at high risk for bronchogenic carcinoma, follow-up chest CT at 1 year is recommended. If the patient is at low risk, no follow-up is needed. This  recommendation follows the consensus statement: Guidelines for Management of Small Pulmonary Nodules Detected on CT Scans: A Statement from the Fleischner Society as published in Radiology 2005; 237:395-400.   Electronically Signed   By: Jolaine ClickArthur  Hoss [name]   On: 06/04/2014 14:57    EXAM:  CT ANGIOGRAPHY CHEST WITH CONTRAST  TECHNIQUE:  Multidetector CT imaging of the chest was performed using the  standard protocol during bolus administration of intravenous  contrast. Multiplanar CT image reconstructions and MIPs were  obtained to evaluate the vascular anatomy.  CONTRAST: 100mL OMNIPAQUE IOHEXOL 350 MG/ML SOLN IV  COMPARISON: None  FINDINGS:  Fusiform aneurysmal dilatation ascending thoracic aorta 4.8 x 4.6 cm  image 35.  Aortic arch and descending thoracic aorta normal caliber.  Mild scattered atherosclerotic plaque formation thoracic aorta.  No evidence of aortic dissection or saccular aneurysm.  Plaque identified at origin of the celiac artery.  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  Pulmonary arteries well opacified and patent.  Visualized upper abdomen otherwise unremarkable.  No thoracic adenopathy.  Lungs clear.  No pleural effusion or pneumothorax.  Osseous structures unremarkable.  Review of the MIP images confirms the above findings.  IMPRESSION:  Fusiform aneurysmal dilatation ascending thoracic aorta measuring a  maximum of 4.8 x 4.6 cm.  Minimal scattered atherosclerotic calcifications.  Electronically Signed  By: Ulyses SouthwardMark Boles [name]  On: 10/21/2013 09:50  No cardiac cath  Recent Lab Findings: Lab Results  Component Value Date   WBC 6.4 09/24/2015   HGB 13.7 09/24/2015   HCT 40.1 09/24/2015   PLT 249 09/24/2015   GLUCOSE 102 (H) 11/30/2015   CHOL 211 (H) 11/30/2015   TRIG 114 11/30/2015   HDL 49 11/30/2015   LDLCALC 139 (H) 11/30/2015   ALT 13 11/30/2015   AST 16 11/30/2015   NA 143 11/30/2015   K 4.0 11/30/2015    CL 101 11/30/2015   CREATININE 0.76 11/30/2015   BUN 15 11/30/2015   CO2 28 11/30/2015   INR 1.10 09/24/2015    ECHO7/2017 Study Conclusions  - Left ventricle: The cavity size was normal. Wall thickness was  increased in a pattern of moderate LVH. Systolic function was  normal. The estimated ejection fraction was in the range of 60%  to 65%. Wall motion was normal; there were no regional wall  motion abnormalities. Features are consistent with a pseudonormal  left ventricular filling pattern, with concomitant abnormal  relaxation and increased filling pressure (grade 2 diastolic  dysfunction). Doppler parameters are consistent with high  ventricular filling pressure. - Aortic valve: Moderately thickened, severely calcified leaflets.  There was moderate to severe stenosis. There was mild  regurgitation. Peak velocity (S): 390 cm/s. Mean gradient (S): 34  mm Hg. Valve area (VTI): 1.01 cm^2. Valve area (Vmax): 0.89 cm^2.  Valve area (Vmean): 0.93 cm^2. -  Aorta: Ascending aortic diameter: 46 mm (S). - Ascending aorta: The ascending aorta was mild to moderately  dilated. - Mitral valve: Calcified annulus. There was mild regurgitation. - Left atrium: The atrium was moderately dilated. - Right ventricle: Systolic function was mildly to moderately  reduced. - Tricuspid valve: There was mild-moderate regurgitation.  Transthoracic echocardiography. M-mode, complete 2D, spectral Doppler, and color Doppler. Birthdate: Patient birthdate: May 02, 1945. Age: Patient is 70 yr old. Sex: Gender: female. BMI: 42.2 kg/m^2. Blood pressure: 126/76 Patient status: Inpatient. Study date: Study date: 09/08/2015. Study time: 10:33 AM.  -------------------------------------------------------------------  ------------------------------------------------------------------- Left ventricle: The cavity size was normal. Wall thickness was increased in a pattern of  moderate LVH. Systolic function was normal. The estimated ejection fraction was in the range of 60% to 65%. Wall motion was normal; there were no regional wall motion abnormalities. Features are consistent with a pseudonormal left ventricular filling pattern, with concomitant abnormal relaxation and increased filling pressure (grade 2 diastolic dysfunction). Doppler parameters are consistent with high ventricular filling pressure.  ------------------------------------------------------------------- Aortic valve: Moderately thickened, severely calcified leaflets. Doppler: There was moderate to severe stenosis. There was mild regurgitation. VTI ratio of LVOT to aortic valve: 0.32. Valve area (VTI): 1.01 cm^2. Indexed valve area (VTI): 0.48 cm^2/m^2. Peak velocity ratio of LVOT to aortic valve: 0.28. Valve area (Vmax): 0.89 cm^2. Indexed valve area (Vmax): 0.42 cm^2/m^2. Mean velocity ratio of LVOT to aortic valve: 0.3. Valve area (Vmean): 0.93 cm^2. Indexed valve area (Vmean): 0.44 cm^2/m^2. Mean gradient (S): 34 mm Hg. Peak gradient (S): 61 mm Hg.  ------------------------------------------------------------------- Aorta: Aortic root: The aortic root was normal in size. Ascending aorta: The ascending aorta was mild to moderately dilated.  ------------------------------------------------------------------- Mitral valve: Calcified annulus. Doppler: There was mild regurgitation. Peak gradient (D): 5 mm Hg.  ------------------------------------------------------------------- Left atrium: The atrium was moderately dilated.  ------------------------------------------------------------------- Atrial septum: No defect or patent foramen ovale was identified.  ------------------------------------------------------------------- Right ventricle: The cavity size was normal. Systolic function was mildly to moderately  reduced.  ------------------------------------------------------------------- Pulmonic valve: The valve appears to be grossly normal. Doppler: There was trivial regurgitation.  ------------------------------------------------------------------- Tricuspid valve: The valve appears to be grossly normal. Doppler: There was mild-moderate regurgitation.  ------------------------------------------------------------------- Right atrium: The atrium was normal in size.  ------------------------------------------------------------------- Pericardium: There was no pericardial effusion.  ------------------------------------------------------------------- Systemic veins: Inferior vena cava: The vessel was normal in size. The respirophasic diameter changes were in the normal range (>= 50%), consistent with normal central venous pressure.  ------------------------------------------------------------------- Measurements  Left ventricle Value Reference LV ID, ED, PLAX chordal (L) 38.4 mm 43 - 52 LV ID, ES, PLAX chordal 24.8 mm 23 - 38 LV fx shortening, PLAX chordal 35 % >=29 LV PW thickness, ED 13.7 mm --------- IVS/LV PW ratio, ED 1.03 <=1.3 Stroke volume, 2D 100 ml --------- Stroke volume/bsa, 2D 48 ml/m^2 --------- LV ejection fraction, 1-p A4C 76 % --------- LV end-diastolic volume, 2-p 43 ml --------- LV end-systolic volume, 2-p 12 ml --------- LV ejection fraction, 2-p 72 % --------- Stroke volume, 2-p 31 ml --------- LV end-diastolic volume/bsa, 2-p 20 ml/m^2 --------- LV end-systolic volume/bsa,  2-p 6 ml/m^2 --------- Stroke volume/bsa, 2-p 14.6 ml/m^2 --------- LV e&', lateral 9.09 cm/s --------- LV E/e&', lateral 11.99 --------- LV e&', medial 5.9 cm/s --------- LV E/e&', medial 18.47 --------- LV e&', average 7.5 cm/s --------- LV E/e&', average 14.54 --------- Longitudinal strain, TDI 21 % ---------  Ventricular septum Value Reference IVS thickness, ED 14.1 mm ---------  LVOT Value Reference LVOT ID, S 20  mm --------- LVOT area 3.14 cm^2 --------- LVOT ID 20 mm --------- LVOT peak velocity, S 111 cm/s --------- LVOT mean velocity, S 80.8 cm/s --------- LVOT VTI, S 31.8 cm --------- Stroke volume (SV), LVOT DP 99.9 ml --------- Stroke index (SV/bsa), LVOT DP 47.7 ml/m^2 ---------  Aortic valve Value Reference Aortic valve peak velocity, S 390 cm/s --------- Aortic valve mean velocity, S 273 cm/s --------- Aortic valve VTI, S 98.8 cm --------- Aortic mean gradient, S 34 mm Hg --------- Aortic peak gradient, S 61 mm Hg --------- VTI ratio, LVOT/AV 0.32 --------- Aortic valve area, VTI 1.01 cm^2 --------- Aortic valve area/bsa,  VTI 0.48 cm^2/m^2 --------- Velocity ratio, peak, LVOT/AV 0.28 --------- Aortic valve area, peak velocity 0.89 cm^2 --------- Aortic valve area/bsa, peak 0.42 cm^2/m^2 --------- velocity Velocity ratio, mean, LVOT/AV 0.3 --------- Aortic valve area, mean velocity 0.93 cm^2 --------- Aortic valve area/bsa, mean 0.44 cm^2/m^2 --------- velocity Aortic regurg pressure half-time 779 ms ---------  Aorta Value Reference Aortic root ID, ED 35 mm --------- Ascending aorta ID, A-P, S 46 mm ---------  Left atrium Value Reference LA ID, A-P, ES 37 mm --------- LA ID/bsa, A-P 1.76 cm/m^2 <=2.2 LA volume, S 68.5 ml --------- LA volume/bsa, S 32.7 ml/m^2 --------- LA volume, ES, 1-p A4C 58.6 ml --------- LA volume/bsa, ES, 1-p A4C 28 ml/m^2 --------- LA volume, ES, 1-p A2C 71.5 ml --------- LA volume/bsa, ES, 1-p A2C 34.1 ml/m^2 ---------  Mitral valve Value Reference Mitral E-wave peak velocity 109 cm/s --------- Mitral A-wave peak velocity 76.2 cm/s --------- Mitral deceleration time 198 ms 150 - 230 Mitral peak gradient, D 5 mm Hg --------- Mitral E/A ratio, peak 1.4 ---------  Tricuspid valve Value Reference Tricuspid regurg peak velocity 268 cm/s  --------- Tricuspid peak RV-RA gradient 29 mm Hg ---------  Right ventricle Value Reference TAPSE 19.7 mm --------- RV s&', lateral, S 8 cm/s ---------  Pulmonic valve Value Reference Pulmonic valve peak velocity, S 62.6 cm/s ---------  Legend: (L) and (H) mark values outside specified reference range.  ------------------------------------------------------------------- Prepared and Electronically Authenticated by  Prentice Docker, MD 2017-07-05T16:06:02  CATH: Procedures   Right/Left Heart Cath and Coronary Angiography  Conclusion    The left ventricular systolic function is normal.  Nonobstructive CAD.  Normal PA pressures. CI 2.3; PA sat 68%.  Dilated ascending aorta.   Continue with plans for AVR and aneurysm repair.    Indications   Aortic stenosis [I35.0 (ICD-10-CM)]  Complications   Complications documented in old activity   The risks, benefits, and details of the procedure were explained to the patient. The patient verbalized understanding and wanted to proceed. Informed written consent was obtained.   PROCEDURE TECHNIQUE: After Xylocaine anesthesia, a 5 French sheath was placed in the right antecubital area. A 5 French balloontipped Swan-Ganz catheter was advanced to the pulmonary artery under fluoroscopic guidance. Hemodynamic pressures were obtained. Oxygen saturations were obtained. After Xylocaine anesthesia, a 48F sheath was placed in the right radial artery with a single anterior needle wall stick.  Left coronary angiography was done using a Judkins L4 guide catheter. Right coronary angiography was done using an AR1 guide catheter. Left heart cath was done using a pigtail catheter.    The aorti valve was crossed with an AL 1 catheter.   Contrast: 95    During this procedure the patient is administered a total of Versed 1 mg and Fentanyl 25 mg to achieve and maintain moderate conscious sedation. The patient's heart rate, blood pressure, and oxygen saturation are  monitored continuously during the procedure. The period of conscious sedation is 48 minutes, of which I was present face-to-face 100% of this time.   Estimated blood loss <50 mL. There were no immediate complications during the procedure.      Coronary Findings   Dominance: Co-dominant  Left Anterior Descending  Mid LAD lesion, 25% stenosed.  Second Diagonal [employer] 2nd Diag to 2nd Diag lesion, 50% stenosed.  Right Heart   Right Heart Pressures LV EDP is normal.    Wall Motion              Left Heart   Left Ventricle The left ventricular size is normal. The left ventricular systolic function is normal. The left ventricular ejection fraction is 55-65% by visual estimate. There are no wall motion abnormalities in the left ventricle.    Aortic Valve There is moderate aortic valve stenosis.    Coronary Diagrams   Diagnostic Diagram     Implants     No implant documentation for this case.  PACS Images   Show images for Cardiac catheterization   Link to Procedure Log   Procedure Log    Hemo Data   Flowsheet Row Most Recent Value  Fick Cardiac Output 4.64 L/min  Fick Cardiac Output Index 2.38 (L/min)/BSA  Aortic Mean Gradient 27.8 mmHg  Aortic Peak Gradient 30 mmHg  Aortic Valve Area 0.91  Aortic Value Area Index 0.47 cm2/BSA  RA A Wave 10 mmHg  RA V Wave 8 mmHg  RA Mean 6 mmHg  RV Systolic Pressure 29 mmHg  RV Diastolic Pressure 4 mmHg  RV EDP 10 mmHg  PA Systolic Pressure 31 mmHg  PA Diastolic Pressure 11 mmHg  PA Mean 20 mmHg  PW A Wave 16 mmHg  PW V Wave 14 mmHg  PW Mean 11 mmHg  AO Systolic Pressure 101 mmHg  AO Diastolic Pressure 50 mmHg  AO Mean 69 mmHg  LV Systolic Pressure 138 mmHg  LV Diastolic Pressure 7 mmHg  LV EDP 18  mmHg  Arterial Occlusion Pressure Extended Systolic Pressure 112 mmHg  Arterial Occlusion Pressure Extended Diastolic Pressure 57 mmHg  Arterial Occlusion Pressure Extended Mean Pressure 80 mmHg  Left Ventricular Apex Extended Systolic Pressure 142 mmHg  Left Ventricular Apex Extended Diastolic Pressure 4 mmHg  Left Ventricular Apex Extended EDP Pressure 16 mmHg  QP/QS 1  TPVR Index 8.4 HRUI  TSVR Index 29 HRUI  PVR SVR Ratio 0.14  TPVR/TSVR Ratio 0.29     ECHO 09/2013 Study Conclusions  - Left ventricle: The cavity size was normal. Wall thickness was increased in a pattern of mild LVH. Systolic function was vigorous. The estimated ejection fraction was in the range of 65% to 70%. Wall motion was normal; there were no regional wall motion abnormalities. Doppler parameters are consistent with abnormal left ventricular relaxation (grade 1 diastolic dysfunction). - Aortic valve: Bicuspid; moderately calcified leaflets. Cusp separation was reduced. There was moderate to severe stenosis. There was mild regurgitation. Mean gradient (S): 21 mm Hg. LVOT/AV VTI ratio 0.32. Peak gradient (S): 40 mm Hg. Valve area (VTI): 0.93 cm^2. Valve area (Vmax): 0.96 cm^2. - Ascending aorta: The ascending aorta was moderately dilated. Approximately 46 mm. - Mitral valve: Calcified annulus. There was trivial regurgitation. - Right atrium: Central venous pressure (est): 3 mm Hg. - Atrial septum: No defect or patent foramen ovale was identified. - Tricuspid valve: There was trivial regurgitation. - Pulmonary arteries: PA peak pressure: 29 mm Hg (S). - Pericardium,  extracardiac: There was no pericardial effusion  Peak velocity aortic valve 315 cm/sec  Study Conclusions  - Left ventricle: The cavity size was normal. Systolic function was vigorous. The estimated ejection fraction was in the range of 65% to 70%. Wall motion was normal; there were no regional wall motion abnormalities. Features  are consistent with a pseudonormal left ventricular filling pattern, with concomitant abnormal relaxation and increased filling pressure (grade 2 diastolic dysfunction). Doppler parameters are consistent with high ventricular filling pressure. Mild to moderate concentric left ventricular hypertrophy. - Aortic valve: Mildly to moderately calcified annulus. Moderately thickened, severely calcified leaflets. Cusp separation was reduced. There was moderate to severe stenosis. There was mild regurgitation. Peak velocity (S): 364 cm/s. Mean gradient: 32 mmHg. Valve area (Vmax): 0.91 cm^2. - Aorta: Ascending aortic diameter: 43 mm (S). Mild ascending aortic dilatation. - Mitral valve: Mildly to moderately calcified annulus. There was mild regurgitation.  Impressions:  - When compared to the report dated 10/02/13, the mean aortic valve gradient has increased by 11 mmHg. Moderate to severe stenosis is seen. Aortic Size Index=    4.8    /Body surface area is 2.04 meters squared. = 2.38  < 2.75 cm/m2      4% risk per year 2.75 to 4.25          8% risk per year > 4.25 cm/m2    20% risk per year   Assessment / Plan:    Patient symptomatic  aortic stenosis, peak velocity across the aortic valve estimated at 390   Cm/s Mean  gradient (S): 34 mm Hg. Peak gradient (S): 61 mm Hg. and  probable bicuspid aortic valve with the aorta dilated to 4.7cm. She notes increasing symptoms of sob with activity.    The severity of her aortic stenosis has  Increasing  gradient  from previous echo. With increasing gradient and symptoms I have recommended to the patient proceeding with cardiac cath  And AVR  and replacement of Ascending  aorta.  Use of mechanical versus tissue valve is been discussed with the patient she prefers at her age of 23 a tissue valve and not having to take Coumadin. She says she wants nothing "mechanical".  Since her last CT of the chest was almost year ago we will  repeat this study prior to surgery. With her changing symptoms she now would like to proceed with surgery in the next several weeks. I reviewed with her again the progressive symptoms of aortic stenosis, and with near critical aortic stenosis have recommended to her that we proceed with aortic valve replacement, with a dilated aorta and question of a bicuspid aortic valve we also discussed replacement of her ascending aorta. She is agreeable with proceeding the risks and options of been discussed with her in detail.    We'll tentatively arrange for surgery on November 13 with a CTA of the chest suite to confirm the size of her aorta.   The [employer] of [employer] [facility]) and the [place] Heart Association [facility]) have issued a statement to clarify 2 previous guidelines from the [facility], AHA, and collaborating societies addressing the risk of aortic dissection in patients with bicuspid aortic valves (BAV) and severe aortic enlargement. The 2 guidelines differ with regard to the recommended threshold of aortic root or ascending aortic dilatation that would justify surgical intervention in patients with bicuspid aortic valves. This new statement of clarification uses the ACC/AHA revised structure for delineating the Class of Recommendation and Level of Evidence to provide  recommendation that replace those contained in Section 9.2.2.1 of the thoracic aortic disease guidelines and Section 5.1.3 of the valvular heart disease guideline. New recommendations in intervention in patients with BAV and dilatation of the aortic root (sinuses) or ascending aorta include:  . Operative intervention to repair or replace the aortic root (sinuses) or replace the ascending aorta is indicated in asymptomatic patients with BAV if the diameter of the aortic root or ascending aorta is 5.5 cm or greater. (Class of recommendation 1, Level of evidence B-NR).  Marland Kitchen Operative intervention to repair or replace the aortic root (sinuses) or  replace the ascending aorta is reasonable in asymptomatic patients with BAV if the diameter of the aortic root or ascending aorta is 5.0 cm or greater and an additional risk factor for dissection is present or if the patient is at low surgical risk and the surgery is performed by an experienced aortic surgical team in a center with established expertise in these procedures. (Class of recommendation IIa; Level of Evidence B-NR).  . Replacement of the ascending aorta is reasonable in patients with BAV undergoing AVR because of severe aortic stenosis or aortic regurgitation when the diameter of the ascending aorta is greater than 4.5 cm (Class of recommendation IIa; Level of evidence C-EO).  Citation: Gae Bon, Kristen Loader, et al. Surgery for aortic dilatation in patients with bicuspid aortic valves. A statement of clarification from the [employer]/American Heart Association Task Force on Clinical Practice Guidelines. [Published online ahead of print February 06, 2014]. Circulation. doi: 10.1161/CIR.0000000000000331.   Delight Ovens MD      [address] [place] [place] [address] Office [phone]   Beeper [phone]  01/06/2016 10:41 AM

## 2016-01-06 NOTE — Patient Instructions (Signed)
Stop lisinopril nov11 for surgery nov13

## 2016-01-10 ENCOUNTER — Ambulatory Visit
Admission: RE | Admit: 2016-01-10 | Discharge: 2016-01-10 | Disposition: A | Discharge status: Home / Self Care | Payer: Medicare Other | Source: Ambulatory Visit | Attending: Cardiothoracic Surgery | Admitting: Cardiothoracic Surgery

## 2016-01-10 DIAGNOSIS — Z01818 Encounter for other preprocedural examination: Secondary | ICD-10-CM

## 2016-01-10 DIAGNOSIS — I712 Thoracic aortic aneurysm, without rupture: Secondary | ICD-10-CM | POA: Diagnosis not present

## 2016-01-10 DIAGNOSIS — I77810 Thoracic aortic ectasia: Secondary | ICD-10-CM

## 2016-01-10 MED ORDER — IOPAMIDOL (ISOVUE-370) INJECTION 76%
75.0000 mL | Freq: Once | INTRAVENOUS | Status: AC | PRN
  Administered 2016-01-10: 75 mL via INTRAVENOUS

## 2016-01-13 NOTE — Pre-Procedure Instructions (Signed)
Deitra MayoLinda M Spadafore  01/13/2016      [facility] [address] - [address] [place] [address] Phone: [phone] Fax: [phone]  [facility] [place], [place] - [address] [place] [id number] Phone: [phone] Fax: [phone]    Your procedure is scheduled on Monday November 13  [facility].M.  Call this number if you have problems the morning of surgery:  [phone]   Remember:  Do not eat food or drink liquids after midnight..   Take these medicines the morning of surgery with A SIP OF WATER cetirizine (ZYRTEC),   7 days prior to surgery STOP taking any Aspirin, Aleve, Naproxen, Ibuprofen, Motrin, Advil, Goody's, BC's, all herbal medications, fish oil, and all vitamins    [facility]- Preparing For Surgery  Before surgery, you can play an important role. Because skin is not sterile, your skin needs to be as free of germs as possible. You can reduce the number of germs on your skin by washing with CHG (chlorahexidine gluconate) Soap before surgery.  CHG is an antiseptic cleaner which kills germs and bonds with the skin to continue killing germs even after washing.  Please do not use if you have an allergy to CHG or antibacterial soaps. If your skin becomes reddened/irritated stop using the CHG.  Do not shave (including legs and underarms) for at least 48 hours prior to first CHG shower. It is OK to shave your face.  Please follow these instructions carefully.   1. Shower the NIGHT BEFORE SURGERY and the MORNING OF SURGERY with CHG.   2. If you chose to wash your hair, wash your hair first as usual with your normal shampoo.  3. After you shampoo, rinse your hair and body thoroughly to remove the shampoo.  4. Use CHG as you would any other liquid soap. You can apply CHG directly to the skin and wash gently with a scrungie or a clean washcloth.    5. Apply the CHG Soap to your body ONLY FROM THE NECK DOWN.  Do not use on open wounds or open sores. Avoid contact with your eyes, ears, mouth and genitals (private parts). Wash genitals (private parts) with your normal soap.  6. Wash thoroughly, paying special attention to the area where your surgery will be performed.  7. Thoroughly rinse your body with warm water from the neck down.  8. DO NOT shower/wash with your normal soap after using and rinsing off the CHG Soap.  9. Pat yourself dry with a CLEAN TOWEL.   10. Wear CLEAN PAJAMAS   11. Place CLEAN SHEETS on your bed the night of your first shower and DO NOT SLEEP WITH PETS.    Day of Surgery: Do not apply any deodorants/lotions. Please wear clean clothes to the hospital/surgery center.       Do not wear jewelry, make-up or nail polish.  Do not wear lotions, powders, or perfumes, or deoderant.  Do not shave 48 hours prior to surgery.  .  Do not bring valuables to the hospital.  [facility] is not responsible for any belongings or valuables.  Contacts, dentures or bridgework may not be worn into surgery.  Leave your suitcase in the car.  After surgery it may be brought to your room.  For patients admitted to the hospital, discharge time will be determined by your treatment team.  Patients discharged the day of surgery will not be allowed to drive home.    Special instructions:   [place]- Preparing For Surgery  Before surgery, you can play an important role. Because skin is not sterile, your skin needs to be as free of germs as possible. You can reduce the number of germs on your skin by washing with CHG (chlorahexidine gluconate) Soap before surgery.  CHG is an antiseptic cleaner which kills germs and bonds with the skin to continue killing germs even after washing.  Please do not use if you have an allergy to CHG or antibacterial soaps. If your skin becomes reddened/irritated stop using the CHG.  Do not shave  (including legs and underarms) for at least 48 hours prior to first CHG shower. It is OK to shave your face.  Please follow these instructions carefully.   12. Shower the NIGHT BEFORE SURGERY and the MORNING OF SURGERY with CHG.   13. If you chose to wash your hair, wash your hair first as usual with your normal shampoo.  14. After you shampoo, rinse your hair and body thoroughly to remove the shampoo.  15. Use CHG as you would any other liquid soap. You can apply CHG directly to the skin and wash gently with a scrungie or a clean washcloth.   16. Apply the CHG Soap to your body ONLY FROM THE NECK DOWN.  Do not use on open wounds or open sores. Avoid contact with your eyes, ears, mouth and genitals (private parts). Wash genitals (private parts) with your normal soap.  17. Wash thoroughly, paying special attention to the area where your surgery will be performed.  18. Thoroughly rinse your body with warm water from the neck down.  19. DO NOT shower/wash with your normal soap after using and rinsing off the CHG Soap.  20. Pat yourself dry with a CLEAN TOWEL.   21. Wear CLEAN PAJAMAS   22. Place CLEAN SHEETS on your bed the night of your first shower and DO NOT SLEEP WITH PETS.    Day of Surgery: Do not apply any deodorants/lotions. Please wear clean clothes to the hospital/surgery center.      Please read over the following fact sheets that you were given.

## 2016-01-14 ENCOUNTER — Ambulatory Visit (HOSPITAL_COMMUNITY)
Admission: RE | Admit: 2016-01-14 | Discharge: 2016-01-14 | Disposition: A | Discharge status: Home / Self Care | Payer: Medicare Other | Source: Ambulatory Visit | Attending: Cardiothoracic Surgery | Admitting: Cardiothoracic Surgery

## 2016-01-14 ENCOUNTER — Encounter (HOSPITAL_COMMUNITY)
Admission: RE | Admit: 2016-01-14 | Discharge: 2016-01-14 | Disposition: A | Discharge status: Home / Self Care | Payer: Medicare Other | Source: Ambulatory Visit | Attending: Cardiothoracic Surgery | Admitting: Cardiothoracic Surgery

## 2016-01-14 ENCOUNTER — Encounter (HOSPITAL_COMMUNITY): Payer: Self-pay

## 2016-01-14 ENCOUNTER — Ambulatory Visit (HOSPITAL_BASED_OUTPATIENT_CLINIC_OR_DEPARTMENT_OTHER)
Admission: RE | Admit: 2016-01-14 | Discharge: 2016-01-14 | Disposition: A | Discharge status: Home / Self Care | Payer: Medicare Other | Source: Ambulatory Visit | Attending: Cardiothoracic Surgery | Admitting: Cardiothoracic Surgery

## 2016-01-14 DIAGNOSIS — Z820 Family history of epilepsy and other diseases of the nervous system: Secondary | ICD-10-CM | POA: Diagnosis not present

## 2016-01-14 DIAGNOSIS — I10 Essential (primary) hypertension: Secondary | ICD-10-CM | POA: Diagnosis not present

## 2016-01-14 DIAGNOSIS — E119 Type 2 diabetes mellitus without complications: Secondary | ICD-10-CM | POA: Diagnosis not present

## 2016-01-14 DIAGNOSIS — I2510 Atherosclerotic heart disease of native coronary artery without angina pectoris: Secondary | ICD-10-CM | POA: Diagnosis not present

## 2016-01-14 DIAGNOSIS — I77810 Thoracic aortic ectasia: Secondary | ICD-10-CM | POA: Diagnosis not present

## 2016-01-14 DIAGNOSIS — D62 Acute posthemorrhagic anemia: Secondary | ICD-10-CM | POA: Diagnosis not present

## 2016-01-14 DIAGNOSIS — E8770 Fluid overload, unspecified: Secondary | ICD-10-CM | POA: Diagnosis not present

## 2016-01-14 DIAGNOSIS — E669 Obesity, unspecified: Secondary | ICD-10-CM | POA: Diagnosis not present

## 2016-01-14 DIAGNOSIS — Z01810 Encounter for preprocedural cardiovascular examination: Secondary | ICD-10-CM | POA: Insufficient documentation

## 2016-01-14 DIAGNOSIS — M5134 Other intervertebral disc degeneration, thoracic region: Secondary | ICD-10-CM | POA: Insufficient documentation

## 2016-01-14 DIAGNOSIS — Z87891 Personal history of nicotine dependence: Secondary | ICD-10-CM | POA: Diagnosis not present

## 2016-01-14 DIAGNOSIS — Z880 Allergy status to penicillin: Secondary | ICD-10-CM | POA: Diagnosis not present

## 2016-01-14 DIAGNOSIS — I350 Nonrheumatic aortic (valve) stenosis: Secondary | ICD-10-CM

## 2016-01-14 DIAGNOSIS — Z6841 Body Mass Index (BMI) 40.0 and over, adult: Secondary | ICD-10-CM | POA: Diagnosis not present

## 2016-01-14 DIAGNOSIS — I700 Atherosclerosis of aorta: Secondary | ICD-10-CM

## 2016-01-14 HISTORY — DX: Cardiac murmur, unspecified: R01.1

## 2016-01-14 HISTORY — DX: Personal history of other diseases of the respiratory system: Z87.09

## 2016-01-14 HISTORY — DX: Adverse effect of unspecified anesthetic, initial encounter: T41.45XA

## 2016-01-14 HISTORY — DX: Personal history of urinary calculi: Z87.442

## 2016-01-14 HISTORY — DX: Other complications of anesthesia, initial encounter: T88.59XA

## 2016-01-14 HISTORY — DX: Dyspnea, unspecified: R06.00

## 2016-01-14 LAB — PULMONARY FUNCTION TEST
DL/VA % pred: 103 %
DL/VA: 4.39 ml/min/mmHg/L
DLCO cor % pred: 69 %
DLCO cor: 13.19 ml/min/mmHg
DLCO unc % pred: 70 %
DLCO unc: 13.39 ml/min/mmHg
FEF 25-75 Post: 1.89 L/sec
FEF 25-75 Pre: 1.34 L/sec
FEF2575-%Change-Post: 41 %
FEF2575-%Pred-Post: 111 %
FEF2575-%Pred-Pre: 78 %
FEV1-%Change-Post: 7 %
FEV1-%Pred-Post: 82 %
FEV1-%Pred-Pre: 76 %
FEV1-Post: 1.58 L
FEV1-Pre: 1.46 L
FEV1FVC-%Change-Post: 1 %
FEV1FVC-%Pred-Pre: 105 %
FEV6-%Change-Post: 5 %
FEV6-%Pred-Post: 79 %
FEV6-%Pred-Pre: 75 %
FEV6-Post: 1.93 L
FEV6-Pre: 1.82 L
FEV6FVC-%Pred-Post: 105 %
FEV6FVC-%Pred-Pre: 105 %
FVC-%Change-Post: 5 %
FVC-%Pred-Post: 75 %
FVC-%Pred-Pre: 71 %
FVC-Post: 1.93 L
FVC-Pre: 1.82 L
Post FEV1/FVC ratio: 82 %
Post FEV6/FVC ratio: 100 %
Pre FEV1/FVC ratio: 80 %
Pre FEV6/FVC Ratio: 100 %
RV % pred: 117 %
RV: 2.32 L
TLC % pred: 95 %
TLC: 4.25 L

## 2016-01-14 LAB — COMPREHENSIVE METABOLIC PANEL
ALT: 22 U/L (ref 14–54)
AST: 30 U/L (ref 15–41)
Albumin: 3.7 g/dL (ref 3.5–5.0)
Alkaline Phosphatase: 54 U/L (ref 38–126)
Anion gap: 9 (ref 5–15)
BUN: 16 mg/dL (ref 6–20)
CO2: 26 mmol/L (ref 22–32)
Calcium: 8.8 mg/dL — ABNORMAL LOW (ref 8.9–10.3)
Chloride: 103 mmol/L (ref 101–111)
Creatinine, Ser: 0.87 mg/dL (ref 0.44–1.00)
GFR calc Af Amer: >60 mL/min (ref >60)
GFR calc non Af Amer: >60 mL/min (ref >60)
Glucose, Bld: 122 mg/dL — ABNORMAL HIGH (ref 65–99)
Potassium: 3.7 mmol/L (ref 3.5–5.1)
Sodium: 138 mmol/L (ref 135–145)
Total Bilirubin: 0.4 mg/dL (ref 0.3–1.2)
Total Protein: 6.6 g/dL (ref 6.5–8.1)

## 2016-01-14 LAB — CBC
HCT: 39.5 % (ref 36.0–46.0)
Hemoglobin: 13.9 g/dL (ref 12.0–15.0)
MCH: 34.2 pg — ABNORMAL HIGH (ref 26.0–34.0)
MCHC: 35.2 g/dL (ref 30.0–36.0)
MCV: 97.3 fL (ref 78.0–100.0)
Platelets: 198 10*3/uL (ref 150–400)
RBC: 4.06 MIL/uL (ref 3.87–5.11)
RDW: 12.9 % (ref 11.5–15.5)
WBC: 7.4 10*3/uL (ref 4.0–10.5)

## 2016-01-14 LAB — URINALYSIS, ROUTINE W REFLEX MICROSCOPIC
Bilirubin Urine: NEGATIVE
Glucose, UA: NEGATIVE mg/dL
Ketones, ur: NEGATIVE mg/dL
Nitrite: NEGATIVE
Protein, ur: NEGATIVE mg/dL
Specific Gravity, Urine: 1.023 (ref 1.005–1.030)
pH: 5 (ref 5.0–8.0)

## 2016-01-14 LAB — PROTIME-INR
INR: 0.91
Prothrombin Time: 12.3 seconds (ref 11.4–15.2)

## 2016-01-14 LAB — URINE MICROSCOPIC-ADD ON

## 2016-01-14 LAB — BLOOD GAS, ARTERIAL
Acid-Base Excess: 1.4 mmol/L (ref 0.0–2.0)
Bicarbonate: 25.4 mmol/L (ref 20.0–28.0)
Drawn by: 421801
FIO2: 21
O2 Saturation: 96.4 %
Patient temperature: 98.6
pCO2 arterial: 39.6 mmHg (ref 32.0–48.0)
pH, Arterial: 7.422 (ref 7.350–7.450)
pO2, Arterial: 84.9 mmHg (ref 83.0–108.0)

## 2016-01-14 LAB — SURGICAL PCR SCREEN
MRSA, PCR: NEGATIVE
Staphylococcus aureus: NEGATIVE

## 2016-01-14 LAB — ABO/RH: ABO/RH(D): A POS

## 2016-01-14 LAB — APTT: aPTT: 22 seconds — ABNORMAL LOW (ref 24–36)

## 2016-01-14 MED ORDER — ALBUTEROL SULFATE (2.5 MG/3ML) 0.083% IN NEBU
2.5000 mg | INHALATION_SOLUTION | Freq: Once | RESPIRATORY_TRACT | Status: AC
  Administered 2016-01-14: 2.5 mg via RESPIRATORY_TRACT

## 2016-01-14 NOTE — Pre-Procedure Instructions (Addendum)
Deitra MayoLinda M Lewan  01/14/2016      [facility] [address] - [place], [place] - [address] [place] [address] Phone: [phone] Fax: [phone]  [facility] [place], [place] - [address] [place] [id number] Phone: [phone] Fax: [phone]    Your procedure is scheduled on Monday, November 13th, 2017.  [facility]30 [facility].M.   Call this number if you have problems the morning of surgery:  [phone]   Remember:  Do not eat food or drink liquids after midnight.   Take these medicines the morning of surgery with A SIP OF WATER: Atenolol (Tenormin), Cetirizine (Zyrtec)  Stop taking: Aleve, Naproxen, Ibuprofen, Advil, Motrin, BC's, Goody's, Fish oil, all herbal medications, and all vitamins.      Do not wear jewelry, make-up or nail polish.  Do not wear lotions, powders, or perfumes, or deoderant.  Do not shave 48 hours prior to surgery.    Do not bring valuables to the hospital.  [facility] is not responsible for any belongings or valuables.  Contacts, dentures or bridgework may not be worn into surgery.  Leave your suitcase in the car.  After surgery it may be brought to your room.  For patients admitted to the hospital, discharge time will be determined by your treatment team.  Patients discharged the day of surgery will not be allowed to drive home.   Special instructions:  Preparing for Surgery.   [place]- Preparing For Surgery  Before surgery, you can play an important role. Because skin is not sterile, your skin needs to be as free of germs as possible. You can reduce the number of germs on your skin by washing with CHG (chlorahexidine gluconate) Soap before surgery.  CHG is an antiseptic cleaner which kills germs and bonds with the skin to continue killing germs even after washing.  Please do not use if you have an allergy to CHG or antibacterial  soaps. If your skin becomes reddened/irritated stop using the CHG.  Do not shave (including legs and underarms) for at least 48 hours prior to first CHG shower. It is OK to shave your face.  Please follow these instructions carefully.   1. Shower the NIGHT BEFORE SURGERY and the MORNING OF SURGERY with CHG.   2. If you chose to wash your hair, wash your hair first as usual with your normal shampoo.  3. After you shampoo, rinse your hair and body thoroughly to remove the shampoo.  4. Use CHG as you would any other liquid soap. You can apply CHG directly to the skin and wash gently with a scrungie or a clean washcloth.   5. Apply the CHG Soap to your body ONLY FROM THE NECK DOWN.  Do not use on open wounds or open sores. Avoid contact with your eyes, ears, mouth and genitals (private parts). Wash genitals (private parts) with your normal soap.  6. Wash thoroughly, paying special attention to the area where your surgery will be performed.  7. Thoroughly rinse your body with warm water from the neck down.  8. DO NOT shower/wash with your normal soap after using and rinsing off the CHG Soap.  9. Pat yourself dry with a CLEAN TOWEL.   10. Wear CLEAN PAJAMAS   11. Place CLEAN SHEETS on your bed the night of your first shower and DO NOT SLEEP WITH PETS.  Day of  Surgery: Do not apply any deodorants/lotions. Please wear clean clothes to the hospital/surgery center.     Please read over the following fact sheets that you were given. MRSA Information

## 2016-01-14 NOTE — Progress Notes (Signed)
Pre-op Cardiac Surgery  Carotid Findings:  Right - 1% to 39% ICA stenosis. Left - 40% to 50% ICA stenosis upper end of scale. Bilateral - Vertebral artery flow is anterade.  Upper Extremity Right Left  Brachial Pressures    Radial Waveforms    Ulnar Waveforms    Palmar Arch (Allen's Test)     Findings: Doppler waveforms on the right reverse with radial compression and remain normal with ulnar compression. Left Doppler waveforms remained normal with both radial and ulnar compression.    [employer] Elsey Dawson, RVS  01/14/2016, 12.55 PM

## 2016-01-14 NOTE — Progress Notes (Signed)
PCP - [facility] Benn[facility] Cardiologist - Dr. Wyline MoodBranch  EKG - 01/14/16 CXR - 01/14/16  Echo- 09/08/15 Cardiac Cath - 09/2015  Patient denies chest pain and acute shortness of breath.    Patient states that she was instructed to stop taking her Lisinopril as of 01/14/16.

## 2016-01-15 LAB — HEMOGLOBIN A1C
Hgb A1c MFr Bld: 5.3 % (ref 4.8–5.6)
Mean Plasma Glucose: 105 mg/dL

## 2016-01-16 LAB — VAS US DOPPLER PRE CABG
LEFT ECA DIAS: -6 cm/s
LEFT VERTEBRAL DIAS: -11 cm/s
Left CCA dist dias: -15 cm/s
Left CCA dist sys: -57 cm/s
Left CCA prox dias: 16 cm/s
Left CCA prox sys: 65 cm/s
Left ICA dist dias: -39 cm/s
Left ICA dist sys: -137 cm/s
Left ICA prox dias: -43 cm/s
Left ICA prox sys: -147 cm/s
RIGHT ECA DIAS: -10 cm/s
RIGHT VERTEBRAL DIAS: 11 cm/s
Right CCA prox dias: 12 cm/s
Right CCA prox sys: 67 cm/s
Right cca dist sys: -81 cm/s

## 2016-01-16 MED ORDER — LEVOFLOXACIN IN D5W 500 MG/100ML IV SOLN
500.0000 mg | INTRAVENOUS | Status: AC
  Administered 2016-01-17: 500 mg via INTRAVENOUS
  Filled 2016-01-16: qty 100

## 2016-01-16 MED ORDER — TRANEXAMIC ACID (OHS) BOLUS VIA INFUSION
15.0000 mg/kg | INTRAVENOUS | Status: AC
  Administered 2016-01-17: 1483.5 mg via INTRAVENOUS
  Filled 2016-01-16: qty 1484

## 2016-01-16 MED ORDER — POTASSIUM CHLORIDE 2 MEQ/ML IV SOLN
80.0000 meq | INTRAVENOUS | Status: DC
  Filled 2016-01-16: qty 40

## 2016-01-16 MED ORDER — TRANEXAMIC ACID (OHS) PUMP PRIME SOLUTION
2.0000 mg/kg | INTRAVENOUS | Status: DC
  Filled 2016-01-16: qty 1.98

## 2016-01-16 MED ORDER — DEXMEDETOMIDINE HCL IN NACL 400 MCG/100ML IV SOLN
0.1000 ug/kg/h | INTRAVENOUS | Status: AC
  Administered 2016-01-17: .3 ug/kg/h via INTRAVENOUS
  Filled 2016-01-16: qty 100

## 2016-01-16 MED ORDER — PLASMA-LYTE 148 IV SOLN
INTRAVENOUS | Status: AC
  Administered 2016-01-17: 500 mL
  Filled 2016-01-16: qty 2.5

## 2016-01-16 MED ORDER — SODIUM CHLORIDE 0.9 % IV SOLN
INTRAVENOUS | Status: AC
  Administered 2016-01-17: 1.5 [IU]/h via INTRAVENOUS
  Filled 2016-01-16: qty 2.5

## 2016-01-16 MED ORDER — DOPAMINE-DEXTROSE 3.2-5 MG/ML-% IV SOLN
0.0000 ug/kg/min | INTRAVENOUS | Status: DC
  Filled 2016-01-16: qty 250

## 2016-01-16 MED ORDER — PHENYLEPHRINE HCL 10 MG/ML IJ SOLN
30.0000 ug/min | INTRAVENOUS | Status: AC
  Administered 2016-01-17: 20 ug/min via INTRAVENOUS
  Administered 2016-01-17: 10 ug/min via INTRAVENOUS
  Filled 2016-01-16: qty 2

## 2016-01-16 MED ORDER — SODIUM CHLORIDE 0.9 % IV SOLN
INTRAVENOUS | Status: DC
  Filled 2016-01-16: qty 30

## 2016-01-16 MED ORDER — VANCOMYCIN HCL 10 G IV SOLR
1500.0000 mg | INTRAVENOUS | Status: AC
  Administered 2016-01-17: 1500 mg via INTRAVENOUS
  Filled 2016-01-16: qty 1500

## 2016-01-16 MED ORDER — EPINEPHRINE PF 1 MG/ML IJ SOLN
0.0000 ug/min | INTRAVENOUS | Status: DC
  Filled 2016-01-16: qty 4

## 2016-01-16 MED ORDER — MAGNESIUM SULFATE 50 % IJ SOLN
40.0000 meq | INTRAMUSCULAR | Status: DC
  Filled 2016-01-16: qty 10

## 2016-01-16 MED ORDER — TRANEXAMIC ACID 1000 MG/10ML IV SOLN
1.5000 mg/kg/h | INTRAVENOUS | Status: AC
  Administered 2016-01-17: 1.5 mg/kg/h via INTRAVENOUS
  Filled 2016-01-16: qty 25

## 2016-01-16 MED ORDER — NITROGLYCERIN IN D5W 200-5 MCG/ML-% IV SOLN
2.0000 ug/min | INTRAVENOUS | Status: DC
  Filled 2016-01-16: qty 250

## 2016-01-17 ENCOUNTER — Inpatient Hospital Stay (HOSPITAL_COMMUNITY): Payer: Medicare Other

## 2016-01-17 ENCOUNTER — Encounter (HOSPITAL_COMMUNITY): Payer: Self-pay | Admitting: Urology

## 2016-01-17 ENCOUNTER — Inpatient Hospital Stay (HOSPITAL_COMMUNITY)
Admission: RE | Admit: 2016-01-17 | Discharge: 2016-01-22 | DRG: 220 | Disposition: A | Discharge status: Home / Self Care | Payer: Medicare Other | Source: Ambulatory Visit | Attending: Cardiothoracic Surgery | Admitting: Cardiothoracic Surgery

## 2016-01-17 ENCOUNTER — Inpatient Hospital Stay (HOSPITAL_COMMUNITY): Payer: Medicare Other | Admitting: Anesthesiology

## 2016-01-17 ENCOUNTER — Encounter (HOSPITAL_COMMUNITY)
Admission: RE | Disposition: A | Discharge status: Home / Self Care | Payer: Self-pay | Source: Ambulatory Visit | Attending: Cardiothoracic Surgery

## 2016-01-17 DIAGNOSIS — I719 Aortic aneurysm of unspecified site, without rupture: Secondary | ICD-10-CM | POA: Diagnosis not present

## 2016-01-17 DIAGNOSIS — I10 Essential (primary) hypertension: Secondary | ICD-10-CM | POA: Diagnosis present

## 2016-01-17 DIAGNOSIS — Z6841 Body Mass Index (BMI) 40.0 and over, adult: Secondary | ICD-10-CM

## 2016-01-17 DIAGNOSIS — Z8249 Family history of ischemic heart disease and other diseases of the circulatory system: Secondary | ICD-10-CM | POA: Diagnosis not present

## 2016-01-17 DIAGNOSIS — K5900 Constipation, unspecified: Secondary | ICD-10-CM | POA: Diagnosis not present

## 2016-01-17 DIAGNOSIS — I350 Nonrheumatic aortic (valve) stenosis: Principal | ICD-10-CM | POA: Diagnosis present

## 2016-01-17 DIAGNOSIS — R918 Other nonspecific abnormal finding of lung field: Secondary | ICD-10-CM | POA: Diagnosis not present

## 2016-01-17 DIAGNOSIS — Z888 Allergy status to other drugs, medicaments and biological substances status: Secondary | ICD-10-CM | POA: Diagnosis not present

## 2016-01-17 DIAGNOSIS — Z09 Encounter for follow-up examination after completed treatment for conditions other than malignant neoplasm: Secondary | ICD-10-CM

## 2016-01-17 DIAGNOSIS — I2510 Atherosclerotic heart disease of native coronary artery without angina pectoris: Secondary | ICD-10-CM | POA: Diagnosis not present

## 2016-01-17 DIAGNOSIS — Z4682 Encounter for fitting and adjustment of non-vascular catheter: Secondary | ICD-10-CM | POA: Diagnosis not present

## 2016-01-17 DIAGNOSIS — J9811 Atelectasis: Secondary | ICD-10-CM | POA: Diagnosis not present

## 2016-01-17 DIAGNOSIS — Z9689 Presence of other specified functional implants: Secondary | ICD-10-CM

## 2016-01-17 DIAGNOSIS — D62 Acute posthemorrhagic anemia: Secondary | ICD-10-CM | POA: Diagnosis not present

## 2016-01-17 DIAGNOSIS — I77810 Thoracic aortic ectasia: Secondary | ICD-10-CM | POA: Diagnosis not present

## 2016-01-17 DIAGNOSIS — Z87891 Personal history of nicotine dependence: Secondary | ICD-10-CM | POA: Diagnosis not present

## 2016-01-17 DIAGNOSIS — Z952 Presence of prosthetic heart valve: Secondary | ICD-10-CM | POA: Diagnosis not present

## 2016-01-17 DIAGNOSIS — E8770 Fluid overload, unspecified: Secondary | ICD-10-CM | POA: Diagnosis not present

## 2016-01-17 DIAGNOSIS — I358 Other nonrheumatic aortic valve disorders: Secondary | ICD-10-CM | POA: Diagnosis not present

## 2016-01-17 DIAGNOSIS — Z95828 Presence of other vascular implants and grafts: Secondary | ICD-10-CM

## 2016-01-17 DIAGNOSIS — E669 Obesity, unspecified: Secondary | ICD-10-CM | POA: Diagnosis not present

## 2016-01-17 DIAGNOSIS — Z820 Family history of epilepsy and other diseases of the nervous system: Secondary | ICD-10-CM

## 2016-01-17 DIAGNOSIS — E119 Type 2 diabetes mellitus without complications: Secondary | ICD-10-CM | POA: Diagnosis present

## 2016-01-17 DIAGNOSIS — Z880 Allergy status to penicillin: Secondary | ICD-10-CM

## 2016-01-17 DIAGNOSIS — I77819 Aortic ectasia, unspecified site: Secondary | ICD-10-CM | POA: Diagnosis not present

## 2016-01-17 DIAGNOSIS — E785 Hyperlipidemia, unspecified: Secondary | ICD-10-CM | POA: Diagnosis not present

## 2016-01-17 HISTORY — PX: REPLACEMENT ASCENDING AORTA: SHX6068

## 2016-01-17 HISTORY — PX: TEE WITHOUT CARDIOVERSION: SHX5443

## 2016-01-17 HISTORY — PX: AORTIC VALVE REPLACEMENT: SHX41

## 2016-01-17 LAB — POCT I-STAT 3, ART BLOOD GAS (G3+)
Acid-Base Excess: 5 mmol/L — ABNORMAL HIGH (ref 0.0–2.0)
Acid-Base Excess: 1 mmol/L (ref 0.0–2.0)
Acid-Base Excess: 1 mmol/L (ref 0.0–2.0)
Acid-base deficit: 4 mmol/L — ABNORMAL HIGH (ref 0.0–2.0)
Acid-base deficit: 2 mmol/L (ref 0.0–2.0)
Bicarbonate: 28.8 mmol/L — ABNORMAL HIGH (ref 20.0–28.0)
Bicarbonate: 25.6 mmol/L (ref 20.0–28.0)
Bicarbonate: 24.4 mmol/L (ref 20.0–28.0)
Bicarbonate: 28.6 mmol/L — ABNORMAL HIGH (ref 20.0–28.0)
Bicarbonate: 23.3 mmol/L (ref 20.0–28.0)
O2 Saturation: 100 %
O2 Saturation: 100 %
O2 Saturation: 96 %
O2 Saturation: 99 %
O2 Saturation: 100 %
Patient temperature: 36.2
Patient temperature: 36.4
Patient temperature: 36.3
TCO2: 30 mmol/L (ref 0–100)
TCO2: 27 mmol/L (ref 0–100)
TCO2: 26 mmol/L (ref 0–100)
TCO2: 30 mmol/L (ref 0–100)
TCO2: 24 mmol/L (ref 0–100)
pCO2 arterial: 36.6 mmHg (ref 32.0–48.0)
pCO2 arterial: 42.6 mmHg (ref 32.0–48.0)
pCO2 arterial: 58.8 mmHg — ABNORMAL HIGH (ref 32.0–48.0)
pCO2 arterial: 60.1 mmHg — ABNORMAL HIGH (ref 32.0–48.0)
pCO2 arterial: 39 mmHg (ref 32.0–48.0)
pH, Arterial: 7.504 — ABNORMAL HIGH (ref 7.350–7.450)
pH, Arterial: 7.387 (ref 7.350–7.450)
pH, Arterial: 7.221 — ABNORMAL LOW (ref 7.350–7.450)
pH, Arterial: 7.282 — ABNORMAL LOW (ref 7.350–7.450)
pH, Arterial: 7.381 (ref 7.350–7.450)
pO2, Arterial: 363 mmHg — ABNORMAL HIGH (ref 83.0–108.0)
pO2, Arterial: 428 mmHg — ABNORMAL HIGH (ref 83.0–108.0)
pO2, Arterial: 98 mmHg (ref 83.0–108.0)
pO2, Arterial: 152 mmHg — ABNORMAL HIGH (ref 83.0–108.0)
pO2, Arterial: 187 mmHg — ABNORMAL HIGH (ref 83.0–108.0)

## 2016-01-17 LAB — POCT I-STAT 4, (NA,K, GLUC, HGB,HCT)
Glucose, Bld: 88 mg/dL (ref 65–99)
HCT: 29 % — ABNORMAL LOW (ref 36.0–46.0)
Hemoglobin: 9.9 g/dL — ABNORMAL LOW (ref 12.0–15.0)
Potassium: 4.1 mmol/L (ref 3.5–5.1)
Sodium: 140 mmol/L (ref 135–145)

## 2016-01-17 LAB — POCT I-STAT, CHEM 8
BUN: 22 mg/dL — ABNORMAL HIGH (ref 6–20)
BUN: 19 mg/dL (ref 6–20)
BUN: 20 mg/dL (ref 6–20)
BUN: 18 mg/dL (ref 6–20)
BUN: 17 mg/dL (ref 6–20)
BUN: 16 mg/dL (ref 6–20)
BUN: 15 mg/dL (ref 6–20)
BUN: 13 mg/dL (ref 6–20)
Calcium, Ion: 1.19 mmol/L (ref 1.15–1.40)
Calcium, Ion: 0.94 mmol/L — ABNORMAL LOW (ref 1.15–1.40)
Calcium, Ion: 1.08 mmol/L — ABNORMAL LOW (ref 1.15–1.40)
Calcium, Ion: 1 mmol/L — ABNORMAL LOW (ref 1.15–1.40)
Calcium, Ion: 0.96 mmol/L — ABNORMAL LOW (ref 1.15–1.40)
Calcium, Ion: 0.89 mmol/L — CL (ref 1.15–1.40)
Calcium, Ion: 0.95 mmol/L — ABNORMAL LOW (ref 1.15–1.40)
Calcium, Ion: 0.98 mmol/L — ABNORMAL LOW (ref 1.15–1.40)
Chloride: 101 mmol/L (ref 101–111)
Chloride: 102 mmol/L (ref 101–111)
Chloride: 98 mmol/L — ABNORMAL LOW (ref 101–111)
Chloride: 99 mmol/L — ABNORMAL LOW (ref 101–111)
Chloride: 100 mmol/L — ABNORMAL LOW (ref 101–111)
Chloride: 99 mmol/L — ABNORMAL LOW (ref 101–111)
Chloride: 100 mmol/L — ABNORMAL LOW (ref 101–111)
Chloride: 101 mmol/L (ref 101–111)
Creatinine, Ser: 0.5 mg/dL (ref 0.44–1.00)
Creatinine, Ser: 0.4 mg/dL — ABNORMAL LOW (ref 0.44–1.00)
Creatinine, Ser: 0.7 mg/dL (ref 0.44–1.00)
Creatinine, Ser: 0.5 mg/dL (ref 0.44–1.00)
Creatinine, Ser: 0.5 mg/dL (ref 0.44–1.00)
Creatinine, Ser: 0.4 mg/dL — ABNORMAL LOW (ref 0.44–1.00)
Creatinine, Ser: 0.4 mg/dL — ABNORMAL LOW (ref 0.44–1.00)
Creatinine, Ser: 0.6 mg/dL (ref 0.44–1.00)
Glucose, Bld: 136 mg/dL — ABNORMAL HIGH (ref 65–99)
Glucose, Bld: 108 mg/dL — ABNORMAL HIGH (ref 65–99)
Glucose, Bld: 112 mg/dL — ABNORMAL HIGH (ref 65–99)
Glucose, Bld: 158 mg/dL — ABNORMAL HIGH (ref 65–99)
Glucose, Bld: 159 mg/dL — ABNORMAL HIGH (ref 65–99)
Glucose, Bld: 133 mg/dL — ABNORMAL HIGH (ref 65–99)
Glucose, Bld: 111 mg/dL — ABNORMAL HIGH (ref 65–99)
Glucose, Bld: 141 mg/dL — ABNORMAL HIGH (ref 65–99)
HCT: 34 % — ABNORMAL LOW (ref 36.0–46.0)
HCT: 26 % — ABNORMAL LOW (ref 36.0–46.0)
HCT: 31 % — ABNORMAL LOW (ref 36.0–46.0)
HCT: 25 % — ABNORMAL LOW (ref 36.0–46.0)
HCT: 26 % — ABNORMAL LOW (ref 36.0–46.0)
HCT: 21 % — ABNORMAL LOW (ref 36.0–46.0)
HCT: 25 % — ABNORMAL LOW (ref 36.0–46.0)
HCT: 33 % — ABNORMAL LOW (ref 36.0–46.0)
Hemoglobin: 11.6 g/dL — ABNORMAL LOW (ref 12.0–15.0)
Hemoglobin: 10.5 g/dL — ABNORMAL LOW (ref 12.0–15.0)
Hemoglobin: 8.8 g/dL — ABNORMAL LOW (ref 12.0–15.0)
Hemoglobin: 8.5 g/dL — ABNORMAL LOW (ref 12.0–15.0)
Hemoglobin: 8.8 g/dL — ABNORMAL LOW (ref 12.0–15.0)
Hemoglobin: 7.1 g/dL — ABNORMAL LOW (ref 12.0–15.0)
Hemoglobin: 8.5 g/dL — ABNORMAL LOW (ref 12.0–15.0)
Hemoglobin: 11.2 g/dL — ABNORMAL LOW (ref 12.0–15.0)
Potassium: 3.9 mmol/L (ref 3.5–5.1)
Potassium: 3.5 mmol/L (ref 3.5–5.1)
Potassium: 3.9 mmol/L (ref 3.5–5.1)
Potassium: 4.4 mmol/L (ref 3.5–5.1)
Potassium: 4.5 mmol/L (ref 3.5–5.1)
Potassium: 4 mmol/L (ref 3.5–5.1)
Potassium: 3.6 mmol/L (ref 3.5–5.1)
Potassium: 4 mmol/L (ref 3.5–5.1)
Sodium: 139 mmol/L (ref 135–145)
Sodium: 138 mmol/L (ref 135–145)
Sodium: 139 mmol/L (ref 135–145)
Sodium: 136 mmol/L (ref 135–145)
Sodium: 135 mmol/L (ref 135–145)
Sodium: 137 mmol/L (ref 135–145)
Sodium: 139 mmol/L (ref 135–145)
Sodium: 138 mmol/L (ref 135–145)
TCO2: 29 mmol/L (ref 0–100)
TCO2: 26 mmol/L (ref 0–100)
TCO2: 29 mmol/L (ref 0–100)
TCO2: 27 mmol/L (ref 0–100)
TCO2: 27 mmol/L (ref 0–100)
TCO2: 28 mmol/L (ref 0–100)
TCO2: 26 mmol/L (ref 0–100)
TCO2: 22 mmol/L (ref 0–100)

## 2016-01-17 LAB — CBC
HCT: 32.6 % — ABNORMAL LOW (ref 36.0–46.0)
HEMATOCRIT: 31.2 % — AB (ref 36.0–46.0)
HEMOGLOBIN: 10.6 g/dL — AB (ref 12.0–15.0)
Hemoglobin: 11.2 g/dL — ABNORMAL LOW (ref 12.0–15.0)
MCH: 32.5 pg (ref 26.0–34.0)
MCH: 32.7 pg (ref 26.0–34.0)
MCHC: 34 g/dL (ref 30.0–36.0)
MCHC: 34.4 g/dL (ref 30.0–36.0)
MCV: 95.7 fL (ref 78.0–100.0)
MCV: 95.3 fL (ref 78.0–100.0)
Platelets: 122 10*3/uL — ABNORMAL LOW (ref 150–400)
Platelets: 134 10*3/uL — ABNORMAL LOW (ref 150–400)
RBC: 3.26 MIL/uL — ABNORMAL LOW (ref 3.87–5.11)
RBC: 3.42 MIL/uL — ABNORMAL LOW (ref 3.87–5.11)
RDW: 12.4 % (ref 11.5–15.5)
RDW: 12.7 % (ref 11.5–15.5)
WBC: 14.4 10*3/uL — AB (ref 4.0–10.5)
WBC: 12.1 10*3/uL — ABNORMAL HIGH (ref 4.0–10.5)

## 2016-01-17 LAB — ECHO INTRAOPERATIVE TEE
HEIGHTINCHES: 60 in
Weight: 3488 oz

## 2016-01-17 LAB — GLUCOSE, CAPILLARY
Glucose-Capillary: 125 mg/dL — ABNORMAL HIGH (ref 65–99)
Glucose-Capillary: 127 mg/dL — ABNORMAL HIGH (ref 65–99)
Glucose-Capillary: 134 mg/dL — ABNORMAL HIGH (ref 65–99)
Glucose-Capillary: 140 mg/dL — ABNORMAL HIGH (ref 65–99)
Glucose-Capillary: 147 mg/dL — ABNORMAL HIGH (ref 65–99)
Glucose-Capillary: 177 mg/dL — ABNORMAL HIGH (ref 65–99)

## 2016-01-17 LAB — CREATININE, SERUM
Creatinine, Ser: 0.73 mg/dL (ref 0.44–1.00)
GFR calc Af Amer: >60 mL/min (ref >60)
GFR calc non Af Amer: >60 mL/min (ref >60)

## 2016-01-17 LAB — HEMOGLOBIN AND HEMATOCRIT, BLOOD
HCT: 25 % — ABNORMAL LOW (ref 36.0–46.0)
Hemoglobin: 8.7 g/dL — ABNORMAL LOW (ref 12.0–15.0)

## 2016-01-17 LAB — PLATELET COUNT: Platelets: 172 10*3/uL (ref 150–400)

## 2016-01-17 LAB — PROTIME-INR
INR: 1.73
Prothrombin Time: 20.5 seconds — ABNORMAL HIGH (ref 11.4–15.2)

## 2016-01-17 LAB — PREPARE RBC (CROSSMATCH)

## 2016-01-17 LAB — MAGNESIUM: Magnesium: 2.9 mg/dL — ABNORMAL HIGH (ref 1.7–2.4)

## 2016-01-17 LAB — APTT: APTT: 44 s — AB (ref 24–36)

## 2016-01-17 SURGERY — REPLACEMENT, AORTIC VALVE, OPEN
Anesthesia: General | Site: Chest

## 2016-01-17 MED ORDER — SODIUM CHLORIDE 0.9 % IJ SOLN
INTRAMUSCULAR | Status: AC
  Filled 2016-01-17: qty 10

## 2016-01-17 MED ORDER — BISACODYL 10 MG RE SUPP: 10.0000 mg | Freq: Every day | RECTAL | Status: DC

## 2016-01-17 MED ORDER — MIDAZOLAM HCL 10 MG/2ML IJ SOLN
INTRAMUSCULAR | Status: AC
  Filled 2016-01-17: qty 2

## 2016-01-17 MED ORDER — NITROGLYCERIN IN D5W 200-5 MCG/ML-% IV SOLN: 0.0000 ug/min | INTRAVENOUS | Status: DC

## 2016-01-17 MED ORDER — ACETAMINOPHEN 160 MG/5ML PO SOLN
1000.0000 mg | Freq: Four times a day (QID) | ORAL | Status: DC
  Administered 2016-01-18 (×2): 1000 mg
  Filled 2016-01-17 (×2): qty 40.6

## 2016-01-17 MED ORDER — METOPROLOL TARTRATE 12.5 MG HALF TABLET
12.5000 mg | ORAL_TABLET | Freq: Two times a day (BID) | ORAL | Status: DC
  Filled 2016-01-17: qty 1

## 2016-01-17 MED ORDER — METOCLOPRAMIDE HCL 5 MG/ML IJ SOLN
10.0000 mg | Freq: Four times a day (QID) | INTRAMUSCULAR | Status: AC
  Administered 2016-01-17 – 2016-01-18 (×4): 10 mg via INTRAVENOUS
  Filled 2016-01-17 (×3): qty 2

## 2016-01-17 MED ORDER — METOPROLOL TARTRATE 12.5 MG HALF TABLET: 12.5000 mg | ORAL_TABLET | Freq: Once | ORAL | Status: DC

## 2016-01-17 MED ORDER — METOPROLOL TARTRATE 5 MG/5ML IV SOLN: 2.5000 mg | INTRAVENOUS | Status: DC | PRN

## 2016-01-17 MED ORDER — PROTAMINE SULFATE 10 MG/ML IV SOLN
INTRAVENOUS | Status: AC
  Filled 2016-01-17: qty 10

## 2016-01-17 MED ORDER — FENTANYL CITRATE (PF) 250 MCG/5ML IJ SOLN
INTRAMUSCULAR | Status: AC
  Filled 2016-01-17: qty 25

## 2016-01-17 MED ORDER — PHENYLEPHRINE HCL 10 MG/ML IJ SOLN
0.0000 ug/min | INTRAVENOUS | Status: DC
  Administered 2016-01-18: 25 ug/min via INTRAVENOUS
  Filled 2016-01-17 (×2): qty 2

## 2016-01-17 MED ORDER — PANTOPRAZOLE SODIUM 40 MG PO TBEC
40.0000 mg | DELAYED_RELEASE_TABLET | Freq: Every day | ORAL | Status: DC
  Filled 2016-01-17: qty 1

## 2016-01-17 MED ORDER — VANCOMYCIN HCL IN DEXTROSE 1-5 GM/200ML-% IV SOLN
1000.0000 mg | Freq: Once | INTRAVENOUS | Status: AC
  Administered 2016-01-17: 1000 mg via INTRAVENOUS
  Filled 2016-01-17: qty 200

## 2016-01-17 MED ORDER — SODIUM CHLORIDE 0.45 % IV SOLN: INTRAVENOUS | Status: DC | PRN

## 2016-01-17 MED ORDER — HEMOSTATIC AGENTS (NO CHARGE) OPTIME
TOPICAL | Status: DC | PRN
  Administered 2016-01-17 (×3): 1 via TOPICAL

## 2016-01-17 MED ORDER — ARTIFICIAL TEARS OP OINT
TOPICAL_OINTMENT | OPHTHALMIC | Status: DC | PRN
  Administered 2016-01-17: 1 via OPHTHALMIC

## 2016-01-17 MED ORDER — CHLORHEXIDINE GLUCONATE 0.12 % MT SOLN
OROMUCOSAL | Status: AC
  Administered 2016-01-17: 15 mL via OROMUCOSAL
  Filled 2016-01-17: qty 15

## 2016-01-17 MED ORDER — METOPROLOL TARTRATE 25 MG/10 ML ORAL SUSPENSION: 12.5000 mg | Freq: Two times a day (BID) | ORAL | Status: DC

## 2016-01-17 MED ORDER — ROCURONIUM BROMIDE 10 MG/ML (PF) SYRINGE
PREFILLED_SYRINGE | INTRAVENOUS | Status: AC
  Filled 2016-01-17: qty 10

## 2016-01-17 MED ORDER — HEPARIN SODIUM (PORCINE) 1000 UNIT/ML IJ SOLN
INTRAMUSCULAR | Status: AC
Start: 2016-01-17 — End: 2016-01-17
  Filled 2016-01-17: qty 1

## 2016-01-17 MED ORDER — OXYCODONE HCL 5 MG PO TABS
5.0000 mg | ORAL_TABLET | ORAL | Status: DC | PRN
  Administered 2016-01-18 – 2016-01-19 (×2): 10 mg via ORAL
  Filled 2016-01-17 (×3): qty 2

## 2016-01-17 MED ORDER — CHLORHEXIDINE GLUCONATE 0.12 % MT SOLN
15.0000 mL | Freq: Once | OROMUCOSAL | Status: AC
  Administered 2016-01-17: 15 mL via OROMUCOSAL

## 2016-01-17 MED ORDER — CHLORHEXIDINE GLUCONATE 0.12% ORAL RINSE (MEDLINE KIT)
15.0000 mL | Freq: Two times a day (BID) | OROMUCOSAL | Status: DC
  Administered 2016-01-17 – 2016-01-22 (×7): 15 mL via OROMUCOSAL

## 2016-01-17 MED ORDER — POTASSIUM CHLORIDE 10 MEQ/50ML IV SOLN: 10.0000 meq | INTRAVENOUS | Status: AC

## 2016-01-17 MED ORDER — HEPARIN SODIUM (PORCINE) 1000 UNIT/ML IJ SOLN
INTRAMUSCULAR | Status: DC | PRN
  Administered 2016-01-17: 35000 [IU] via INTRAVENOUS

## 2016-01-17 MED ORDER — ONDANSETRON HCL 4 MG/2ML IJ SOLN
4.0000 mg | Freq: Four times a day (QID) | INTRAMUSCULAR | Status: DC | PRN
  Administered 2016-01-18: 4 mg via INTRAVENOUS
  Filled 2016-01-17: qty 2

## 2016-01-17 MED ORDER — ORAL CARE MOUTH RINSE
15.0000 mL | Freq: Four times a day (QID) | OROMUCOSAL | Status: DC
  Administered 2016-01-18 – 2016-01-19 (×6): 15 mL via OROMUCOSAL

## 2016-01-17 MED ORDER — LACTATED RINGERS IV SOLN: INTRAVENOUS | Status: DC

## 2016-01-17 MED ORDER — ASPIRIN 81 MG PO CHEW: 324.0000 mg | CHEWABLE_TABLET | Freq: Every day | ORAL | Status: DC

## 2016-01-17 MED ORDER — ACETAMINOPHEN 160 MG/5ML PO SOLN: 650.0000 mg | Freq: Once | ORAL | Status: AC

## 2016-01-17 MED ORDER — ALBUMIN HUMAN 5 % IV SOLN
INTRAVENOUS | Status: DC | PRN
  Administered 2016-01-17: 14:00:00 via INTRAVENOUS

## 2016-01-17 MED ORDER — ACETAMINOPHEN 650 MG RE SUPP
650.0000 mg | Freq: Once | RECTAL | Status: AC
  Administered 2016-01-17: 650 mg via RECTAL

## 2016-01-17 MED ORDER — DEXMEDETOMIDINE HCL IN NACL 200 MCG/50ML IV SOLN
INTRAVENOUS | Status: AC
  Filled 2016-01-17: qty 50

## 2016-01-17 MED ORDER — LORATADINE 10 MG PO TABS
10.0000 mg | ORAL_TABLET | Freq: Every day | ORAL | Status: DC
  Administered 2016-01-19 – 2016-01-22 (×4): 10 mg via ORAL
  Filled 2016-01-17 (×4): qty 1

## 2016-01-17 MED ORDER — MORPHINE SULFATE (PF) 2 MG/ML IV SOLN
2.0000 mg | INTRAVENOUS | Status: DC | PRN
  Administered 2016-01-18 – 2016-01-19 (×7): 2 mg via INTRAVENOUS
  Filled 2016-01-17 (×9): qty 1

## 2016-01-17 MED ORDER — BISACODYL 5 MG PO TBEC
10.0000 mg | DELAYED_RELEASE_TABLET | Freq: Every day | ORAL | Status: DC
Start: 2016-01-18 — End: 2016-01-19
  Filled 2016-01-17: qty 2

## 2016-01-17 MED ORDER — HEPARIN SODIUM (PORCINE) 1000 UNIT/ML IJ SOLN
INTRAMUSCULAR | Status: AC
  Filled 2016-01-17: qty 1

## 2016-01-17 MED ORDER — ALBUMIN HUMAN 5 % IV SOLN
250.0000 mL | INTRAVENOUS | Status: AC | PRN
  Administered 2016-01-17 (×2): 250 mL via INTRAVENOUS

## 2016-01-17 MED ORDER — MIDAZOLAM HCL 2 MG/2ML IJ SOLN: 2.0000 mg | INTRAMUSCULAR | Status: DC | PRN

## 2016-01-17 MED ORDER — FAMOTIDINE IN NACL 20-0.9 MG/50ML-% IV SOLN
20.0000 mg | Freq: Two times a day (BID) | INTRAVENOUS | Status: AC
  Administered 2016-01-17 (×2): 20 mg via INTRAVENOUS
  Filled 2016-01-17: qty 50

## 2016-01-17 MED ORDER — PROTAMINE SULFATE 10 MG/ML IV SOLN
INTRAVENOUS | Status: DC | PRN
  Administered 2016-01-17: 350 mg via INTRAVENOUS

## 2016-01-17 MED ORDER — ROCURONIUM BROMIDE 100 MG/10ML IV SOLN
INTRAVENOUS | Status: DC | PRN
  Administered 2016-01-17: 20 mg via INTRAVENOUS
  Administered 2016-01-17: 50 mg via INTRAVENOUS
  Administered 2016-01-17: 30 mg via INTRAVENOUS
  Administered 2016-01-17 (×2): 50 mg via INTRAVENOUS
  Administered 2016-01-17: 30 mg via INTRAVENOUS
  Administered 2016-01-17: 50 mg via INTRAVENOUS

## 2016-01-17 MED ORDER — ACETAMINOPHEN 500 MG PO TABS
1000.0000 mg | ORAL_TABLET | Freq: Four times a day (QID) | ORAL | Status: DC
  Administered 2016-01-18: 1000 mg via ORAL
  Filled 2016-01-17: qty 2

## 2016-01-17 MED ORDER — MAGNESIUM SULFATE 4 GM/100ML IV SOLN
4.0000 g | Freq: Once | INTRAVENOUS | Status: AC
  Administered 2016-01-17: 4 g via INTRAVENOUS
  Filled 2016-01-17: qty 100

## 2016-01-17 MED ORDER — ROCURONIUM BROMIDE 10 MG/ML (PF) SYRINGE
PREFILLED_SYRINGE | INTRAVENOUS | Status: AC
Start: 2016-01-17 — End: 2016-01-17
  Filled 2016-01-17: qty 10

## 2016-01-17 MED ORDER — MORPHINE SULFATE (PF) 2 MG/ML IV SOLN
1.0000 mg | INTRAVENOUS | Status: AC | PRN
  Administered 2016-01-17 (×2): 2 mg via INTRAVENOUS

## 2016-01-17 MED ORDER — LACTATED RINGERS IV SOLN
INTRAVENOUS | Status: DC | PRN
  Administered 2016-01-17: 07:00:00 via INTRAVENOUS

## 2016-01-17 MED ORDER — LACTATED RINGERS IV SOLN: 500.0000 mL | Freq: Once | INTRAVENOUS | Status: DC | PRN

## 2016-01-17 MED ORDER — DEXMEDETOMIDINE HCL IN NACL 200 MCG/50ML IV SOLN: 0.0000 ug/kg/h | INTRAVENOUS | Status: DC

## 2016-01-17 MED ORDER — SODIUM CHLORIDE 0.9% FLUSH
3.0000 mL | Freq: Two times a day (BID) | INTRAVENOUS | Status: DC
  Administered 2016-01-18: 10 mL via INTRAVENOUS

## 2016-01-17 MED ORDER — SODIUM CHLORIDE 0.9 % IV SOLN: 250.0000 mL | INTRAVENOUS | Status: DC

## 2016-01-17 MED ORDER — 0.9 % SODIUM CHLORIDE (POUR BTL) OPTIME
TOPICAL | Status: DC | PRN
  Administered 2016-01-17: 6000 mL

## 2016-01-17 MED ORDER — PROTAMINE SULFATE 10 MG/ML IV SOLN
INTRAVENOUS | Status: AC
  Filled 2016-01-17: qty 25

## 2016-01-17 MED ORDER — LEVOFLOXACIN IN D5W 750 MG/150ML IV SOLN
750.0000 mg | INTRAVENOUS | Status: AC
  Administered 2016-01-18: 750 mg via INTRAVENOUS
  Filled 2016-01-17: qty 150

## 2016-01-17 MED ORDER — CHLORHEXIDINE GLUCONATE 4 % EX LIQD: 30.0000 mL | CUTANEOUS | Status: DC

## 2016-01-17 MED ORDER — SODIUM CHLORIDE 0.9% FLUSH: 3.0000 mL | INTRAVENOUS | Status: DC | PRN

## 2016-01-17 MED ORDER — INSULIN REGULAR BOLUS VIA INFUSION
0.0000 [IU] | Freq: Three times a day (TID) | INTRAVENOUS | Status: DC
  Filled 2016-01-17: qty 10

## 2016-01-17 MED ORDER — MIDAZOLAM HCL 5 MG/5ML IJ SOLN
INTRAMUSCULAR | Status: DC | PRN
  Administered 2016-01-17: 1 mg via INTRAVENOUS
  Administered 2016-01-17: 2 mg via INTRAVENOUS
  Administered 2016-01-17: 1 mg via INTRAVENOUS
  Administered 2016-01-17: 3 mg via INTRAVENOUS

## 2016-01-17 MED ORDER — TRAMADOL HCL 50 MG PO TABS
50.0000 mg | ORAL_TABLET | ORAL | Status: DC | PRN
Start: 2016-01-17 — End: 2016-01-19

## 2016-01-17 MED ORDER — DOPAMINE-DEXTROSE 3.2-5 MG/ML-% IV SOLN
2.5000 ug/kg/min | INTRAVENOUS | Status: DC
  Filled 2016-01-17: qty 250

## 2016-01-17 MED ORDER — SODIUM CHLORIDE 0.9 % IJ SOLN
OROMUCOSAL | Status: DC | PRN
  Administered 2016-01-17 (×4): 4 mL via TOPICAL

## 2016-01-17 MED ORDER — FENTANYL CITRATE (PF) 250 MCG/5ML IJ SOLN
INTRAMUSCULAR | Status: DC | PRN
  Administered 2016-01-17: 250 ug via INTRAVENOUS
  Administered 2016-01-17: 450 ug via INTRAVENOUS
  Administered 2016-01-17: 250 ug via INTRAVENOUS
  Administered 2016-01-17: 50 ug via INTRAVENOUS
  Administered 2016-01-17: 250 ug via INTRAVENOUS

## 2016-01-17 MED ORDER — DOCUSATE SODIUM 100 MG PO CAPS
200.0000 mg | ORAL_CAPSULE | Freq: Every day | ORAL | Status: DC
  Filled 2016-01-17: qty 2

## 2016-01-17 MED ORDER — SODIUM CHLORIDE 0.9 % IV SOLN
INTRAVENOUS | Status: DC
  Filled 2016-01-17 (×2): qty 2.5

## 2016-01-17 MED ORDER — PROPOFOL 10 MG/ML IV BOLUS
INTRAVENOUS | Status: DC | PRN
  Administered 2016-01-17: 20 mg via INTRAVENOUS

## 2016-01-17 MED ORDER — ASPIRIN EC 325 MG PO TBEC
325.0000 mg | DELAYED_RELEASE_TABLET | Freq: Every day | ORAL | Status: DC
  Filled 2016-01-17: qty 1

## 2016-01-17 MED ORDER — PROPOFOL 10 MG/ML IV BOLUS
INTRAVENOUS | Status: AC
  Filled 2016-01-17: qty 20

## 2016-01-17 MED ORDER — CHLORHEXIDINE GLUCONATE 0.12 % MT SOLN
15.0000 mL | OROMUCOSAL | Status: AC
  Administered 2016-01-17: 15 mL via OROMUCOSAL

## 2016-01-17 MED ORDER — LIDOCAINE 2% (20 MG/ML) 5 ML SYRINGE
INTRAMUSCULAR | Status: AC
  Filled 2016-01-17: qty 5

## 2016-01-17 MED ORDER — SODIUM CHLORIDE 0.9 % IV SOLN: INTRAVENOUS | Status: DC

## 2016-01-17 MED FILL — Lidocaine HCl IV Inj 20 MG/ML: INTRAVENOUS | Qty: 5 | Status: AC

## 2016-01-17 MED FILL — Electrolyte-R (PH 7.4) Solution: INTRAVENOUS | Qty: 6000 | Status: AC

## 2016-01-17 MED FILL — Potassium Chloride Inj 2 mEq/ML: INTRAVENOUS | Qty: 40 | Status: AC

## 2016-01-17 MED FILL — Heparin Sodium (Porcine) Inj 1000 Unit/ML: INTRAMUSCULAR | Qty: 30 | Status: AC

## 2016-01-17 MED FILL — Heparin Sodium (Porcine) Inj 1000 Unit/ML: INTRAMUSCULAR | Qty: 10 | Status: AC

## 2016-01-17 MED FILL — Sodium Chloride IV Soln 0.9%: INTRAVENOUS | Qty: 2000 | Status: AC

## 2016-01-17 MED FILL — Magnesium Sulfate Inj 50%: INTRAMUSCULAR | Qty: 10 | Status: AC

## 2016-01-17 MED FILL — Mannitol IV Soln 20%: INTRAVENOUS | Qty: 500 | Status: AC

## 2016-01-17 MED FILL — Sodium Bicarbonate IV Soln 8.4%: INTRAVENOUS | Qty: 50 | Status: AC

## 2016-01-17 SURGICAL SUPPLY — 94 items
ADAPTER CARDIO PERF ANTE/RETRO (ADAPTER) ×3 IMPLANT
APPLICATOR COTTON TIP 6IN STRL (MISCELLANEOUS) IMPLANT
APPLICATOR TIP COSEAL (VASCULAR PRODUCTS) IMPLANT
BAG DECANTER FOR FLEXI CONT (MISCELLANEOUS) ×3 IMPLANT
BLADE STERNUM SYSTEM 6 (BLADE) ×3 IMPLANT
BLADE SURG 15 STRL LF DISP TIS (BLADE) ×2 IMPLANT
BLADE SURG 15 STRL SS (BLADE) ×1
BOOT SUTURE AID YELLOW STND (SUTURE) IMPLANT
CANISTER SUCTION 2500CC (MISCELLANEOUS) ×3 IMPLANT
CANNULA GUNDRY RCSP 15FR (MISCELLANEOUS) ×3 IMPLANT
CATH CPB KIT GERHARDT (MISCELLANEOUS) ×3 IMPLANT
CATH FOLEY 2WAY SLVR 18FR 30CC (CATHETERS) IMPLANT
CATH HEART VENT LEFT (CATHETERS) ×2 IMPLANT
CATH THORACIC 28FR (CATHETERS) ×3 IMPLANT
CATH/SQUID NICHOLS JEHLE COR (CATHETERS) IMPLANT
CAUTERY EYE LOW TEMP 1300F FIN (OPHTHALMIC RELATED) ×3 IMPLANT
CAUTERY SURG HI TEMP FINE TIP (MISCELLANEOUS) ×3 IMPLANT
CLIP FOGARTY SPRING 6M (CLIP) IMPLANT
CONT SPEC 4OZ CLIKSEAL STRL BL (MISCELLANEOUS) ×9 IMPLANT
CRADLE DONUT ADULT HEAD (MISCELLANEOUS) ×3 IMPLANT
DRAIN CHANNEL 28F RND 3/8 FF (WOUND CARE) ×3 IMPLANT
DRAPE CARDIOVASCULAR INCISE (DRAPES) ×1
DRAPE SLUSH/WARMER DISC (DRAPES) ×3 IMPLANT
DRAPE SRG 135X102X78XABS (DRAPES) ×2 IMPLANT
DRSG AQUACEL AG ADV 3.5X14 (GAUZE/BANDAGES/DRESSINGS) ×3 IMPLANT
ELECT BLADE 4.0 EZ CLEAN MEGAD (MISCELLANEOUS) ×3
ELECT CAUTERY BLADE 6.4 (BLADE) ×3 IMPLANT
ELECT REM PT RETURN 9FT ADLT (ELECTROSURGICAL) ×6
ELECTRODE BLDE 4.0 EZ CLN MEGD (MISCELLANEOUS) ×2 IMPLANT
ELECTRODE REM PT RTRN 9FT ADLT (ELECTROSURGICAL) ×4 IMPLANT
FELT TEFLON 1X6 (MISCELLANEOUS) ×6 IMPLANT
GAUZE SPONGE 4X4 12PLY STRL (GAUZE/BANDAGES/DRESSINGS) ×3 IMPLANT
GLOVE BIO SURGEON STRL SZ 6.5 (GLOVE) ×24 IMPLANT
GLOVE BIOGEL PI IND STRL 6.5 (GLOVE) ×14 IMPLANT
GLOVE BIOGEL PI INDICATOR 6.5 (GLOVE) ×7
GOWN STRL REUS W/ TWL LRG LVL3 (GOWN DISPOSABLE) ×16 IMPLANT
GOWN STRL REUS W/TWL LRG LVL3 (GOWN DISPOSABLE) ×8
GRAFT WOVEN D/V 28DX30L (Vascular Products) ×3 IMPLANT
HEMOSTAT POWDER SURGIFOAM 1G (HEMOSTASIS) ×12 IMPLANT
HEMOSTAT SURGICEL 2X14 (HEMOSTASIS) ×6 IMPLANT
INSERT FOGARTY XLG (MISCELLANEOUS) ×3 IMPLANT
KIT BASIN OR (CUSTOM PROCEDURE TRAY) ×3 IMPLANT
KIT CATH SUCT 8FR (CATHETERS) ×3 IMPLANT
KIT ROOM TURNOVER OR (KITS) ×3 IMPLANT
KIT SUCTION CATH 14FR (SUCTIONS) ×12 IMPLANT
LEAD PACING MYOCARDI (MISCELLANEOUS) ×3 IMPLANT
LINE VENT (MISCELLANEOUS) ×3 IMPLANT
NS IRRIG 1000ML POUR BTL (IV SOLUTION) ×18 IMPLANT
PACK OPEN HEART (CUSTOM PROCEDURE TRAY) ×3 IMPLANT
PAD ARMBOARD 7.5X6 YLW CONV (MISCELLANEOUS) ×6 IMPLANT
SEALANT SURG COSEAL 8ML (VASCULAR PRODUCTS) ×3 IMPLANT
SET CARDIOPLEGIA MPS 5001102 (MISCELLANEOUS) ×3 IMPLANT
SET VEIN GRAFT PERF (SET/KITS/TRAYS/PACK) ×3 IMPLANT
SPONGE LAP 18X18 X RAY DECT (DISPOSABLE) ×3 IMPLANT
STOPCOCK 4 WAY LG BORE MALE ST (IV SETS) IMPLANT
SUT BONE WAX W31G (SUTURE) IMPLANT
SUT ETHIBON 2 0 V 52N 30 (SUTURE) ×6 IMPLANT
SUT ETHIBOND 2 0 SH (SUTURE) ×5
SUT ETHIBOND 2 0 SH 36X2 (SUTURE) ×10 IMPLANT
SUT ETHILON 3 0 FSL (SUTURE) ×3 IMPLANT
SUT PROLENE 3 0 RB 1 (SUTURE) ×3 IMPLANT
SUT PROLENE 3 0 SH 48 (SUTURE) IMPLANT
SUT PROLENE 3 0 SH DA (SUTURE) ×6 IMPLANT
SUT PROLENE 3 0 SH1 36 (SUTURE) ×18 IMPLANT
SUT PROLENE 4 0 RB 1 (SUTURE) ×15
SUT PROLENE 4-0 RB1 .5 CRCL 36 (SUTURE) ×30 IMPLANT
SUT PROLENE 5 0 C 1 36 (SUTURE) ×6 IMPLANT
SUT PROLENE 6 0 C 1 30 (SUTURE) ×9 IMPLANT
SUT SILK  1 MH (SUTURE) ×3
SUT SILK 1 MH (SUTURE) ×6 IMPLANT
SUT SILK 1 TIES 10X30 (SUTURE) ×3 IMPLANT
SUT SILK 2 0 SH CR/8 (SUTURE) ×6 IMPLANT
SUT SILK 2 0 TIES 10X30 (SUTURE) ×3 IMPLANT
SUT SILK 2 0 TIES 17X18 (SUTURE) ×1
SUT SILK 2-0 18XBRD TIE BLK (SUTURE) ×2 IMPLANT
SUT SILK 3 0 SH CR/8 (SUTURE) ×3 IMPLANT
SUT SILK 4 0 TIE 10X30 (SUTURE) ×6 IMPLANT
SUT STEEL 6MS V (SUTURE) ×3 IMPLANT
SUT STEEL SZ 6 DBL 3X14 BALL (SUTURE) ×3 IMPLANT
SUT TEM PAC WIRE 2 0 SH (SUTURE) ×12 IMPLANT
SUT VIC AB 1 CTX 18 (SUTURE) ×12 IMPLANT
SUT VIC AB 2-0 CTX 27 (SUTURE) ×6 IMPLANT
SUT VIC AB 3-0 X1 27 (SUTURE) ×6 IMPLANT
SUTURE E-PAK OPEN HEART (SUTURE) IMPLANT
SYSTEM SAHARA CHEST DRAIN ATS (WOUND CARE) ×3 IMPLANT
TOWEL OR 17X24 6PK STRL BLUE (TOWEL DISPOSABLE) ×6 IMPLANT
TOWEL OR 17X26 10 PK STRL BLUE (TOWEL DISPOSABLE) ×6 IMPLANT
TRAY FOLEY IC TEMP SENS 16FR (CATHETERS) ×3 IMPLANT
TUBE CONNECTING 20X1/4 (TUBING) ×3 IMPLANT
UNDERPAD 30X30 (UNDERPADS AND DIAPERS) ×3 IMPLANT
VALVE MAGNA EASE 21MM (Prosthesis & Implant Heart) ×3 IMPLANT
VENT LEFT HEART 12002 (CATHETERS) ×3
WATER STERILE IRR 1000ML POUR (IV SOLUTION) ×6 IMPLANT
YANKAUER SUCT BULB TIP NO VENT (SUCTIONS) ×3 IMPLANT

## 2016-01-17 NOTE — H&P (Signed)
[address]       [place] [address]             [phone]                    Angela Dawson [facility] Medical Record #[id number] Date of Birth: 02-09-1946  Referring: Dr Wyline Mood Primary Care: Bennie Pierini, FNP  Chief Complaint:    No chief complaint on file.   History of Present Illness:    Angela Dawson 70 y.o. female followed in the office for AS and dilated ascending aorta with  bicuspid aortic valve. The patient was  asymptomatic from a cardiac standpoint.  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  She notes symptoms walking across parking lot to come to appointment .  She denies  PND, pedal edema, syncope, presyncope..   Other than hypertension the patient has no previous history of cardiac disease  She does have a family history of cardiac disease her father died of myocardial infarction age 74   No family history of aortic dissection  She has seen her dentist and dental work all done.   Current Activity/ Functional Status:  Patient is independent with mobility/ambulation, transfers, ADL's, IADL's.   Zubrod Score: At the time of surgery this patient's most appropriate activity status/level should be described as: []     0    Normal activity, no symptoms [x]     1    Restricted in physical strenuous activity but ambulatory, able to do out light work []     2    Ambulatory and capable of self care, unable to do work activities, up and about               >50 % of waking hours                              []     3    Only limited self care, in bed greater than 50% of waking hours []     4    Completely disabled, no self care, confined to bed or chair []     5    Moribund   Past Medical History:  Diagnosis Date  . Allergy    seasonal  . Chicken pox   . Complication of anesthesia    difficult to wake up after tubal ligations  .  Dyspnea    with exertion  . Heart murmur   . History of bronchitis   . History of kidney stones    "only one"  . Hypertension   . Measles     Past Surgical History:  Procedure Laterality Date  . CARDIAC CATHETERIZATION N/A 09/24/2015   Procedure: Right/Left Heart Cath and Coronary Angiography;  Surgeon: Corky Crafts, MD;  Location: [facility] INVASIVE CV LAB;  Service: Cardiovascular;  Laterality: N/A;  . COLONOSCOPY    . TUBAL LIGATION      Family History  Problem Relation Age of Onset  . Alzheimer's disease Mother   . Heart disease Mother   . Heart attack Father 25      History  Smoking Status  . Former Smoker  . Packs/day: 0.50  . Years: 5.00  . Types: Cigarettes  . Start date: 08/04/1968  . Quit date: 10/10/1973  Smokeless Tobacco  .  Never Used    Comment: smoked less than 1 pack every 2 weeks. Havent smoked in atleast 40 years    History  Alcohol Use No     Allergies  Allergen Reactions  . Crestor [Rosuvastatin]     Dizziness  . Lipitor [Atorvastatin]     Unable to stand or move  . Statins     Leg cramps   . Penicillins Rash    Has patient had a PCN reaction causing immediate rash, facial/tongue/throat swelling, SOB or lightheadedness with hypotension: Yes Has patient had a PCN reaction causing severe rash involving mucus membranes or skin necrosis: No Has patient had a PCN reaction that required hospitalization No Has patient had a PCN reaction occurring within the last 10 years: No If all of the above answers are "NO", then may proceed with Cephalosporin use.     Current Facility-Administered Medications  Medication Dose Route Frequency Provider Last Rate Last Dose  . chlorhexidine (HIBICLENS) 4 % liquid 2 application  30 mL Topical UD Delight Ovens, MD      . dexmedetomidine (PRECEDEX) 400 MCG/100ML (4 mcg/mL) infusion  0.1-0.7 mcg/kg/hr Intravenous To OR Delight Ovens, MD      . DOPamine (INTROPIN) 800 mg in dextrose 5 % 250 mL (3.2 mg/mL)  infusion  0-10 mcg/kg/min Intravenous To OR Delight Ovens, MD      . EPINEPHrine (ADRENALIN) 4 mg in dextrose 5 % 250 mL (0.016 mg/mL) infusion  0-10 mcg/min Intravenous To OR Delight Ovens, MD      . heparin 2,500 Units, papaverine 30 mg in electrolyte-148 (PLASMALYTE-148) 500 mL irrigation   Irrigation To OR Delight Ovens, MD      . heparin 30,000 units/NS 1000 mL solution for CELLSAVER   Other To OR Delight Ovens, MD      . insulin regular (NOVOLIN R,HUMULIN R) 250 Units in sodium chloride 0.9 % 250 mL (1 Units/mL) infusion   Intravenous To OR Delight Ovens, MD      . levofloxacin (LEVAQUIN) IVPB 500 mg  500 mg Intravenous To OR Delight Ovens, MD      . magnesium sulfate (IV Push/IM) injection 40 mEq  40 mEq Other To OR Delight Ovens, MD      . metoprolol tartrate (LOPRESSOR) tablet 12.5 mg  12.5 mg Oral Once Delight Ovens, MD      . nitroGLYCERIN 50 mg in dextrose 5 % 250 mL (0.2 mg/mL) infusion  2-200 mcg/min Intravenous To OR Delight Ovens, MD      . phenylephrine (NEO-SYNEPHRINE) 20 mg in dextrose 5 % 250 mL (0.08 mg/mL) infusion  30-200 mcg/min Intravenous To OR Delight Ovens, MD      . potassium chloride injection 80 mEq  80 mEq Other To OR Delight Ovens, MD      . tranexamic acid (CYKLOKAPRON) 2,500 mg in sodium chloride 0.9 % 250 mL (10 mg/mL) infusion  1.5 mg/kg/hr Intravenous To OR Delight Ovens, MD      . tranexamic acid (CYKLOKAPRON) bolus via infusion - over 30 minutes 1,483.5 mg  15 mg/kg Intravenous To OR Delight Ovens, MD      . tranexamic acid (CYKLOKAPRON) pump prime solution 198 mg  2 mg/kg Intracatheter To OR Delight Ovens, MD      . vancomycin (VANCOCIN) 1,500 mg in sodium chloride 0.9 % 250 mL IVPB  1,500 mg Intravenous To OR Delight Ovens, MD  Review of Systems:     Cardiac Review of Systems: Y or N  Chest Pain [ n   ]  Resting SOB [n   ] Exertional SOB  [ n ]  Orthopnea [n  ]   Pedal Edema [ n  ]      Palpitations [n  ] Syncope  Milo.Brash  ]   Presyncope [ n  ]  General Review of Systems: [Y] = yes [  ]=no Constitional: recent weight change [  ];  Wt loss over the last 3 months [  n ] anorexia [  ]; fatigue [n  ]; nausea [  ]; night sweats [  ]; fever [  ]; or chills [  ];          Dental: poor dentition[  ]; Last Dentist visit: last month  Eye : blurred vision [ n ]; diplopia [  n ]; vision changes [ n ];  Amaurosis fugax[n  ]; Resp: cough [  ];  wheezing[n  ];  hemoptysis[n  ]; shortness of breath[ n ]; paroxysmal nocturnal dyspnea[  ]; dyspnea on exertion[  ]; or orthopnea[  ];  GI:  gallstones[  ], vomiting[  ];  dysphagia[  ]; melena[  ];  hematochezia [  ]; heartburn[  ];   Hx of  Colonoscopy[  ]; GU: kidney stones [  ]; hematuria[  ];   dysuria [  ];  nocturia[  ];  history of     obstruction [  ]; urinary frequency [  ]             Skin: rash, swelling[  ];, hair loss[  ];  peripheral edema[  ];  or itching[  ]; Musculosketetal: myalgias[  ];  joint swelling[  ];  joint erythema[  ];  joint pain[  ];  back pain[  ];  Heme/Lymph: bruising[  ];  bleeding[  ];  anemia[  ];  Neuro: TIA[n  ];  headaches[  ];  stroke[  ];  vertigo[  ];  seizures[  ];   paresthesias[ n ];  difficulty walking[n  ];  Psych:depression[ n ]; anxiety[  ];  Endocrine: diabetes[  ];  thyroid dysfunction[  ];  Immunizations: Flu up to date Cove.Etienne  ]; Pneumococcal up to date Cove.Etienne  ];  Other:  Physical Exam: BP (!) 148/71   Pulse 65   Temp 98.2 F (36.8 C) (Oral)   Resp 18   Ht 5' (1.524 m)   Wt 218 lb (98.9 kg)   SpO2 98%   BMI 42.58 kg/m   PHYSICAL EXAMINATION:  General appearance: alert, cooperative, appears stated age and no distress Neurologic: intact Heart: systolic murmur: early systolic 3/6, crescendo at 2nd right intercostal space Lungs: clear to auscultation bilaterally Abdomen: soft, non-tender; bowel sounds normal; no masses,  no organomegaly Extremities: extremities normal, atraumatic, no cyanosis or  edema and Homans sign is negative, no sign of DVT Patient has no carotid bruits, has full and equal brachial radial and pedal pulses Has no cervical or supraclavicular adenopathy  Diagnostic Studies & Laboratory data:     Recent Radiology Findings:  Dg Chest 2 View  Result Date: 01/14/2016 CLINICAL DATA:  Preoperative examination. Severe aortic stenosis. Ascending aortic dilation. History of hypertension and bronchitis. Former smoker. EXAM: CHEST  2 VIEW COMPARISON:  CT scan of the chest of January 10, 2016 FINDINGS: The lungs are adequately inflated and clear. The heart and pulmonary vascularity are normal. The mediastinum  is normal in width. There is calcification in the wall of the aortic arch. The known dilated ascending aorta is not clearly evident on today's plain radiograph. There is no pleural effusion. There is multilevel degenerative disc disease of the mid thoracic spine. IMPRESSION: There is no CHF nor pneumonia. Aortic atherosclerosis. Electronically Signed   By: David  [place] [name]   On: 01/14/2016 09:36   Ct Angio Chest Aorta W &/or Wo Contrast  Result Date: 01/10/2016 CLINICAL DATA:  History of dilated aorta, check size.  Preop EXAM: CT ANGIOGRAPHY CHEST WITH CONTRAST TECHNIQUE: Multidetector CT imaging of the chest was performed using the standard protocol during bolus administration of intravenous contrast. Multiplanar CT image reconstructions and MIPs were obtained to evaluate the vascular anatomy. CONTRAST:  75 mL Isovue 370 intravenous COMPARISON:  01/26/2015, 06/04/2014 FINDINGS: Cardiovascular: Again visualized is dilation of the ascending segment of the aorta. This measures 4.6 x 4.6 cm in greatest dimension, compared with prior measurements of 4.6 x 4.6 cm. There is no intimal flap to suggest dissection. No mediastinal hematoma. There is stable mild ectasia of the proximal arch with tapering of the aorta at to normal caliber at the mid arch, mid arch measurement of 2.5 cm,  compared with 2.5 cm previously. Descending thoracic aorta is normal in caliber measuring 2.3 cm in diameter. No gross filling defects within the pulmonary artery to suggest large embolus. Scattered atherosclerotic calcifications. Mild coronary artery calcifications. Heart size is slightly enlarged. No pericardial effusion. Mediastinum/Nodes: No enlarged mediastinal, hilar, or axillary lymph nodes. Thyroid gland, trachea, and esophagus demonstrate no significant findings. Lungs/Pleura: Lungs are clear. No pleural effusion or pneumothorax. Upper Abdomen: No acute abnormality. Musculoskeletal: No chest wall abnormality. No acute or significant osseous findings. Review of the MIP images confirms the above findings. IMPRESSION: 1. Grossly stable aneurysmal dilatation of the ascending aorta measuring 4.6 cm in maximum diameter. No dissection is seen. Ascending thoracic aortic aneurysm. Recommend semi-annual imaging followup by CTA or MRA and referral to cardiothoracic surgery if not already obtained. This recommendation follows 2010 ACCF/AHA/AATS/ACR/ASA/SCA/SCAI/SIR/STS/SVM Guidelines for the Diagnosis and Management of Patients With Thoracic Aortic Disease. Circulation. 2010; 121: A540-J811 2. Otherwise no significant interval findings. Electronically Signed   By: Jasmine Pang [name]   On: 01/10/2016 14:52    Ct Angio Chest Aorta W/cm &/or Wo/cm  01/26/2015  CLINICAL DATA:  Followup thoracic ascending aorta aneurysm EXAM: CT ANGIOGRAPHY CHEST WITH CONTRAST TECHNIQUE: Multidetector CT imaging of the chest was performed using the standard protocol during bolus administration of intravenous contrast. Multiplanar CT image reconstructions and MIPs were obtained to evaluate the vascular anatomy. CONTRAST:  75 mL Isovue 370 COMPARISON:  06/04/2014 FINDINGS: The lungs are well aerated bilaterally without focal infiltrate, effusion or sizable parenchymal nodule. The thoracic inlet shows no acute abnormality. The thoracic  aorta again demonstrates dilatation of the ascending portion. At the level of the main pulmonary artery it measures approximately 4.6 x 4.6 cm. It tapers in the arch and the descending thoracic aorta is within normal limits. No dissection is seen. Heavy calcification of the aortic arch is noted The pulmonary artery as visualized shows no large central pulmonary emboli. No hilar or mediastinal adenopathy is seen. Mild coronary calcifications are seen. Scanning into the upper abdomen reveals no acute abnormality. The osseous structures show no acute abnormality. Degenerative changes of the thoracic spine are seen. Review of the MIP images confirms the above findings. IMPRESSION: Dilatation of the thoracic aorta in its ascending component measuring 4.6 x  4.6 cm in greatest dimension. This is likely stable from the prior exam as some motion artifact was seen on the prior study. No other focal abnormality is seen. Electronically Signed   By: Alcide CleverMark  Lukens [name]   On: 01/26/2015 14:10   Ct Angio Chest Aorta W/cm &/or Wo/cm   06/04/2014   CLINICAL DATA:  Aortic aneurysm  EXAM: CT ANGIOGRAPHY CHEST WITH CONTRAST  TECHNIQUE: Multidetector CT imaging of the chest was performed using the standard protocol during bolus administration of intravenous contrast. Multiplanar CT image reconstructions and MIPs were obtained to evaluate the vascular anatomy.  CONTRAST:  75 cc Isovue 370  COMPARISON:  10/21/2013  FINDINGS: Maximal aortic diameters at the sinus of Valsalva, sino-tubular junction, and ascending aorta are 3.6 cm, 3.2 cm, and 4.9 cm respectively. Previous maximal diameter was 4.8 cm.  No evidence of aortic dissection or intramural hematoma. Moderate aortic valvular calcifications are noted.  Great vessels are patent within the confines of the study. Bilateral vertebral arteries are also patent.  No obvious evidence of acute pulmonary thromboembolism.  Minimal LAD territory coronary artery calcification. Trace left main  coronary artery calcification.  No abnormal mediastinal adenopathy. Small mediastinal and bilateral axillary nodes.  Patchy ground-glass and airspace opacities in the lingula. There is a 2 mm nodule in the lingula on image 25.  No vertebral compression deformity.  Review of the MIP images confirms the above findings.  IMPRESSION: Maximal diameter of the ascending aorta is 4.9 cm compare with 4.8 cm on the prior study. No evidence of dissection.  There are airspace and ground-glass opacities in the left upper lobe most consistent with an inflammatory process. There is also a 2 mm nodule. If the patient is at high risk for bronchogenic carcinoma, follow-up chest CT at 1 year is recommended. If the patient is at low risk, no follow-up is needed. This recommendation follows the consensus statement: Guidelines for Management of Small Pulmonary Nodules Detected on CT Scans: A Statement from the Fleischner Society as published in Radiology 2005; 237:395-400.   Electronically Signed   By: Jolaine ClickArthur  Hoss [name]   On: 06/04/2014 14:57    EXAM:  CT ANGIOGRAPHY CHEST WITH CONTRAST  TECHNIQUE:  Multidetector CT imaging of the chest was performed using the  standard protocol during bolus administration of intravenous  contrast. Multiplanar CT image reconstructions and MIPs were  obtained to evaluate the vascular anatomy.  CONTRAST: 100mL OMNIPAQUE IOHEXOL 350 MG/ML SOLN IV  COMPARISON: None  FINDINGS:  Fusiform aneurysmal dilatation ascending thoracic aorta 4.8 x 4.6 cm  image 35.  Aortic arch and descending thoracic aorta normal caliber.  Mild scattered atherosclerotic plaque formation thoracic aorta.  No evidence of aortic dissection or saccular aneurysm.  Plaque identified at origin of the celiac artery.  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  Pulmonary arteries well opacified and patent.  Visualized upper abdomen otherwise unremarkable.  No thoracic adenopathy.    Lungs clear.  No pleural effusion or pneumothorax.  Osseous structures unremarkable.  Review of the MIP images confirms the above findings.  IMPRESSION:  Fusiform aneurysmal dilatation ascending thoracic aorta measuring a  maximum of 4.8 x 4.6 cm.  Minimal scattered atherosclerotic calcifications.  Electronically Signed  By: Ulyses SouthwardMark Boles [name]  On: 10/21/2013 09:50    Recent Lab Findings: Lab Results  Component Value Date   WBC 7.4 01/14/2016   HGB 13.9 01/14/2016   HCT 39.5 01/14/2016   PLT 198  01/14/2016   GLUCOSE 122 (H) 01/14/2016   CHOL 211 (H) 11/30/2015   TRIG 114 11/30/2015   HDL 49 11/30/2015   LDLCALC 139 (H) 11/30/2015   ALT 22 01/14/2016   AST 30 01/14/2016   NA 138 01/14/2016   K 3.7 01/14/2016   CL 103 01/14/2016   CREATININE 0.87 01/14/2016   BUN 16 01/14/2016   CO2 26 01/14/2016   INR 0.91 01/14/2016   HGBA1C 5.3 01/14/2016    ECHO7/2017 Study Conclusions  - Left ventricle: The cavity size was normal. Wall thickness was  increased in a pattern of moderate LVH. Systolic function was  normal. The estimated ejection fraction was in the range of 60%  to 65%. Wall motion was normal; there were no regional wall  motion abnormalities. Features are consistent with a pseudonormal  left ventricular filling pattern, with concomitant abnormal  relaxation and increased filling pressure (grade 2 diastolic  dysfunction). Doppler parameters are consistent with high  ventricular filling pressure. - Aortic valve: Moderately thickened, severely calcified leaflets.  There was moderate to severe stenosis. There was mild  regurgitation. Peak velocity (S): 390 cm/s. Mean gradient (S): 34  mm Hg. Valve area (VTI): 1.01 cm^2. Valve area (Vmax): 0.89 cm^2.  Valve area (Vmean): 0.93 cm^2. - Aorta: Ascending aortic diameter: 46 mm (S). - Ascending aorta: The ascending aorta was mild to moderately  dilated. - Mitral valve: Calcified annulus. There was mild  regurgitation. - Left atrium: The atrium was moderately dilated. - Right ventricle: Systolic function was mildly to moderately  reduced. - Tricuspid valve: There was mild-moderate regurgitation.  Transthoracic echocardiography. M-mode, complete 2D, spectral Doppler, and color Doppler. Birthdate: Patient birthdate: [birth year]. Age: Patient is 70 yr old. Sex: Gender: female. BMI: 42.2 kg/m^2. Blood pressure: 126/76 Patient status: Inpatient. Study date: Study date: 09/08/2015. Study time: 10:33 AM.  -------------------------------------------------------------------  ------------------------------------------------------------------- Left ventricle: The cavity size was normal. Wall thickness was increased in a pattern of moderate LVH. Systolic function was normal. The estimated ejection fraction was in the range of 60% to 65%. Wall motion was normal; there were no regional wall motion abnormalities. Features are consistent with a pseudonormal left ventricular filling pattern, with concomitant abnormal relaxation and increased filling pressure (grade 2 diastolic dysfunction). Doppler parameters are consistent with high ventricular filling pressure.  ------------------------------------------------------------------- Aortic valve: Moderately thickened, severely calcified leaflets. Doppler: There was moderate to severe stenosis. There was mild regurgitation. VTI ratio of LVOT to aortic valve: 0.32. Valve area (VTI): 1.01 cm^2. Indexed valve area (VTI): 0.48 cm^2/m^2. Peak velocity ratio of LVOT to aortic valve: 0.28. Valve area (Vmax): 0.89 cm^2. Indexed valve area (Vmax): 0.42 cm^2/m^2. Mean velocity ratio of LVOT to aortic valve: 0.3. Valve area (Vmean): 0.93 cm^2. Indexed valve area (Vmean): 0.44 cm^2/m^2. Mean gradient (S): 34 mm Hg. Peak gradient (S): 61 mm Hg.  ------------------------------------------------------------------- Aorta: Aortic  root: The aortic root was normal in size. Ascending aorta: The ascending aorta was mild to moderately dilated.  ------------------------------------------------------------------- Mitral valve: Calcified annulus. Doppler: There was mild regurgitation. Peak gradient (D): 5 mm Hg.  ------------------------------------------------------------------- Left atrium: The atrium was moderately dilated.  ------------------------------------------------------------------- Atrial septum: No defect or patent foramen ovale was identified.  ------------------------------------------------------------------- Right ventricle: The cavity size was normal. Systolic function was mildly to moderately reduced.  ------------------------------------------------------------------- Pulmonic valve: The valve appears to be grossly normal. Doppler: There was trivial regurgitation.  ------------------------------------------------------------------- Tricuspid valve: The valve appears to be grossly normal. Doppler: There was mild-moderate regurgitation.  ------------------------------------------------------------------- Right atrium: The  atrium was normal in size.  ------------------------------------------------------------------- Pericardium: There was no pericardial effusion.  ------------------------------------------------------------------- Systemic veins: Inferior vena cava: The vessel was normal in size. The respirophasic diameter changes were in the normal range (>= 50%), consistent with normal central venous pressure.  ------------------------------------------------------------------- Measurements  Left ventricle Value Reference LV ID, ED, PLAX chordal (L) 38.4 mm 43 - 52 LV ID, ES, PLAX chordal 24.8 mm 23 - 38 LV fx shortening, PLAX chordal 35 % >=29 LV  PW thickness, ED 13.7 mm --------- IVS/LV PW ratio, ED 1.03 <=1.3 Stroke volume, 2D 100 ml --------- Stroke volume/bsa, 2D 48 ml/m^2 --------- LV ejection fraction, 1-p A4C 76 % --------- LV end-diastolic volume, 2-p 43 ml --------- LV end-systolic volume, 2-p 12 ml --------- LV ejection fraction, 2-p 72 % --------- Stroke volume, 2-p 31 ml --------- LV end-diastolic volume/bsa, 2-p 20 ml/m^2 --------- LV end-systolic volume/bsa, 2-p 6 ml/m^2 --------- Stroke volume/bsa, 2-p 14.6 ml/m^2 --------- LV e&', lateral 9.09 cm/s --------- LV E/e&', lateral 11.99 --------- LV e&', medial 5.9 cm/s --------- LV E/e&', medial 18.47 --------- LV e&', average 7.5 cm/s --------- LV E/e&', average 14.54 --------- Longitudinal strain, TDI 21 % ---------  Ventricular septum Value Reference IVS thickness, ED 14.1 mm ---------  LVOT Value Reference LVOT ID, S 20 mm --------- LVOT area 3.14 cm^2 --------- LVOT ID 20 mm --------- LVOT peak velocity, S 111 cm/s --------- LVOT mean velocity, S 80.8 cm/s --------- LVOT VTI, S 31.8 cm --------- Stroke  volume (SV), LVOT DP 99.9 ml --------- Stroke index (SV/bsa), LVOT DP 47.7 ml/m^2 ---------  Aortic valve Value Reference Aortic valve peak velocity, S 390 cm/s --------- Aortic valve mean velocity, S 273 cm/s --------- Aortic valve VTI, S 98.8 cm --------- Aortic mean gradient, S 34 mm Hg --------- Aortic peak gradient, S 61 mm Hg --------- VTI ratio, LVOT/AV 0.32 --------- Aortic valve area, VTI 1.01 cm^2 --------- Aortic valve area/bsa, VTI 0.48 cm^2/m^2 --------- Velocity ratio, peak, LVOT/AV 0.28 --------- Aortic valve area, peak velocity 0.89 cm^2 --------- Aortic valve area/bsa, peak 0.42 cm^2/m^2 --------- velocity Velocity ratio, mean, LVOT/AV 0.3 --------- Aortic valve area, mean velocity 0.93 cm^2 --------- Aortic valve area/bsa, mean 0.44 cm^2/m^2 --------- velocity Aortic regurg pressure half-time 779 ms ---------  Aorta Value Reference Aortic root ID, ED 35 mm --------- Ascending aorta ID, A-P, S 46 mm ---------  Left atrium Value Reference LA ID, A-P, ES 37 mm --------- LA ID/bsa, A-P 1.76 cm/m^2 <=2.2 LA volume, S 68.5 ml --------- LA volume/bsa, S 32.7 ml/m^2 --------- LA volume, ES, 1-p A4C 58.6 ml --------- LA volume/bsa, ES, 1-p A4C 28 ml/m^2  --------- LA volume, ES, 1-p A2C 71.5 ml --------- LA volume/bsa, ES, 1-p A2C 34.1 ml/m^2 ---------  Mitral valve Value Reference Mitral E-wave peak velocity 109 cm/s --------- Mitral A-wave peak velocity 76.2 cm/s --------- Mitral deceleration time 198 ms 150 - 230 Mitral peak gradient, D 5 mm Hg --------- Mitral E/A ratio, peak 1.4 ---------  Tricuspid valve Value Reference Tricuspid regurg peak velocity 268 cm/s --------- Tricuspid peak RV-RA gradient 29 mm Hg ---------  Right ventricle Value Reference TAPSE 19.7 mm --------- RV s&', lateral, S 8 cm/s ---------  Pulmonic valve Value Reference Pulmonic valve peak velocity, S 62.6 cm/s ---------  Legend: (L) and (H) mark values outside specified reference range.  ------------------------------------------------------------------- Prepared and Electronically Authenticated by  Prentice Docker, MD 2017-07-05T16:06:02  CATH: Procedures   Right/Left Heart Cath and Coronary Angiography  Conclusion    The left ventricular systolic function is normal.  Nonobstructive CAD.  Normal PA pressures. CI 2.3; PA sat 68%.  Dilated ascending aorta.   Continue with plans for AVR and aneurysm repair.    Indications   Aortic stenosis [I35.0 (ICD-10-CM)]  Complications   Complications documented in old activity   The risks, benefits, and details of the procedure were explained to the patient. The patient verbalized understanding and wanted to proceed. Informed written  consent was obtained.   PROCEDURE TECHNIQUE: After Xylocaine anesthesia, a 5 French sheath was placed in the right antecubital area. A 5 French balloontipped Swan-Ganz catheter was advanced to the pulmonary artery under fluoroscopic guidance. Hemodynamic pressures were obtained. Oxygen saturations were obtained. After Xylocaine anesthesia, a 57F sheath was placed in the right radial artery with a single anterior needle wall stick.  Left coronary angiography was done using a Judkins L4 guide catheter. Right coronary angiography was done using an AR1 guide catheter. Left heart cath was done using a pigtail catheter.    The aorti valve was crossed with an AL 1 catheter.   Contrast: 95   During this procedure the patient is administered a total of Versed 1 mg and Fentanyl 25 mg to achieve and maintain moderate conscious sedation. The patient's heart rate, blood pressure, and oxygen saturation are monitored continuously during the procedure. The period of conscious sedation is 48 minutes, of which I was present face-to-face 100% of this time.   Estimated blood loss <50 mL. There were no immediate complications during the procedure.      Coronary Findings   Dominance: Co-dominant  Left Anterior Descending  Mid LAD lesion, 25% stenosed.  Second Diagonal [employer]  Ost 2nd Diag to 2nd Diag lesion, 50% stenosed.  Right Heart   Right Heart Pressures LV EDP is normal.    Wall Motion              Left Heart   Left Ventricle The left ventricular size is normal. The left ventricular systolic function is normal. The left ventricular ejection fraction is 55-65% by visual estimate. There are no wall motion abnormalities in the left ventricle.    Aortic Valve There is moderate aortic valve stenosis.    Coronary Diagrams   Diagnostic Diagram     Implants     No implant documentation for this case.  PACS Images   Show images for Cardiac catheterization   Link to Procedure Log    Procedure Log    Hemo Data   Flowsheet Row Most Recent Value  Fick Cardiac Output 4.64 L/min  Fick Cardiac Output Index 2.38 (L/min)/BSA  Aortic Mean Gradient 27.8 mmHg  Aortic Peak Gradient 30 mmHg  Aortic Valve Area 0.91  Aortic Value Area Index 0.47 cm2/BSA  RA A Wave 10 mmHg  RA V Wave 8 mmHg  RA Mean 6 mmHg  RV Systolic Pressure 29 mmHg  RV Diastolic Pressure 4 mmHg  RV EDP 10 mmHg  PA Systolic Pressure 31 mmHg  PA Diastolic Pressure 11 mmHg  PA Mean 20 mmHg  PW A Wave 16 mmHg  PW V Wave 14 mmHg  PW Mean 11 mmHg  AO Systolic Pressure 101 mmHg  AO Diastolic Pressure 50 mmHg  AO Mean 69 mmHg  LV Systolic Pressure 138 mmHg  LV Diastolic Pressure 7 mmHg  LV EDP 18 mmHg  Arterial Occlusion Pressure Extended Systolic Pressure 112 mmHg  Arterial Occlusion Pressure Extended Diastolic Pressure 57 mmHg  Arterial Occlusion Pressure Extended Mean Pressure 80 mmHg  Left Ventricular Apex Extended Systolic  Pressure 142 mmHg  Left Ventricular Apex Extended Diastolic Pressure 4 mmHg  Left Ventricular Apex Extended EDP Pressure 16 mmHg  QP/QS 1  TPVR Index 8.4 HRUI  TSVR Index 29 HRUI  PVR SVR Ratio 0.14  TPVR/TSVR Ratio 0.29     ECHO 09/2013 Study Conclusions  - Left ventricle: The cavity size was normal. Wall thickness was increased in a pattern of mild LVH. Systolic function was vigorous. The estimated ejection fraction was in the range of 65% to 70%. Wall motion was normal; there were no regional wall motion abnormalities. Doppler parameters are consistent with abnormal left ventricular relaxation (grade 1 diastolic dysfunction). - Aortic valve: Bicuspid; moderately calcified leaflets. Cusp separation was reduced. There was moderate to severe stenosis. There was mild regurgitation. Mean gradient (S): 21 mm Hg. LVOT/AV VTI ratio 0.32. Peak gradient (S): 40 mm Hg. Valve area (VTI): 0.93 cm^2. Valve area (Vmax): 0.96 cm^2. - Ascending aorta: The ascending aorta was  moderately dilated. Approximately 46 mm. - Mitral valve: Calcified annulus. There was trivial regurgitation. - Right atrium: Central venous pressure (est): 3 mm Hg. - Atrial septum: No defect or patent foramen ovale was identified. - Tricuspid valve: There was trivial regurgitation. - Pulmonary arteries: PA peak pressure: 29 mm Hg (S). - Pericardium, extracardiac: There was no pericardial effusion  Peak velocity aortic valve 315 cm/sec  Study Conclusions  - Left ventricle: The cavity size was normal. Systolic function was vigorous. The estimated ejection fraction was in the range of 65% to 70%. Wall motion was normal; there were no regional wall motion abnormalities. Features are consistent with a pseudonormal left ventricular filling pattern, with concomitant abnormal relaxation and increased filling pressure (grade 2 diastolic dysfunction). Doppler parameters are consistent with high ventricular filling pressure. Mild to moderate concentric left ventricular hypertrophy. - Aortic valve: Mildly to moderately calcified annulus. Moderately thickened, severely calcified leaflets. Cusp separation was reduced. There was moderate to severe stenosis. There was mild regurgitation. Peak velocity (S): 364 cm/s. Mean gradient: 32 mmHg. Valve area (Vmax): 0.91 cm^2. - Aorta: Ascending aortic diameter: 43 mm (S). Mild ascending aortic dilatation. - Mitral valve: Mildly to moderately calcified annulus. There was mild regurgitation.  Impressions:  - When compared to the report dated 10/02/13, the mean aortic valve gradient has increased by 11 mmHg. Moderate to severe stenosis is seen. Aortic Size Index=    4.8    /Body surface area is 2.05 meters squared. = 2.38  < 2.75 cm/m2      4% risk per year 2.75 to 4.25          8% risk per year > 4.25 cm/m2    20% risk per year   Assessment / Plan:    Patient symptomatic  aortic stenosis, peak velocity across the  aortic valve estimated at 390   Cm/s Mean  gradient (S): 34 mm Hg. Peak gradient (S): 61 mm Hg. and  probable bicuspid aortic valve with the aorta dilated to 4.7cm. She notes increasing symptoms of sob with activity.    The severity of her aortic stenosis has  Increasing  gradient  from previous echo. With increasing gradient and symptoms I have recommended to the patient proceeding with cardiac cath  And AVR  and replacement of Ascending  aorta.  Use of mechanical versus tissue valve is been discussed with the patient she prefers at her age of 72 a tissue valve and not having to take Coumadin. She says she wants nothing "mechanical".  I reviewed with  her again the progressive symptoms of aortic stenosis, and with near critical aortic stenosis have recommended to her that we proceed with aortic valve replacement, with a dilated aorta and question of a bicuspid aortic valve we also discussed replacement of her ascending aorta. She is agreeable with proceeding the risks and options of been discussed with her in detail.   The goals risks and alternatives of the planned surgical procedure AVR and replacement of ascending aorta   have been discussed with the patient in detail. The risks of the procedure including death, infection, stroke, myocardial infarction, bleeding, blood transfusion, heart block,  have all been discussed specifically.  I have quoted [name] a 3 % of perioperative mortality and a complication rate as high as40  %. The patient's questions have been answered.REGLA FITZGIBBON is willing  to proceed with the planned procedure.  The [employer] [facility]) and the [place] Heart Association [facility]) have issued a statement to clarify 2 previous guidelines from the [facility], AHA, and collaborating societies addressing the risk of aortic dissection in patients with bicuspid aortic valves (BAV) and severe aortic enlargement. The 2 guidelines differ with regard to the recommended threshold of  aortic root or ascending aortic dilatation that would justify surgical intervention in patients with bicuspid aortic valves. This new statement of clarification uses the ACC/AHA revised structure for delineating the Class of Recommendation and Level of Evidence to provide recommendation that replace those contained in Section 9.2.2.1 of the thoracic aortic disease guidelines and Section 5.1.3 of the valvular heart disease guideline. New recommendations in intervention in patients with BAV and dilatation of the aortic root (sinuses) or ascending aorta include:  . Operative intervention to repair or replace the aortic root (sinuses) or replace the ascending aorta is indicated in asymptomatic patients with BAV if the diameter of the aortic root or ascending aorta is 5.5 cm or greater. (Class of recommendation 1, Level of evidence B-NR).  Marland Kitchen Operative intervention to repair or replace the aortic root (sinuses) or replace the ascending aorta is reasonable in asymptomatic patients with BAV if the diameter of the aortic root or ascending aorta is 5.0 cm or greater and an additional risk factor for dissection is present or if the patient is at low surgical risk and the surgery is performed by an experienced aortic surgical team in a center with established expertise in these procedures. (Class of recommendation IIa; Level of Evidence B-NR).  . Replacement of the ascending aorta is reasonable in patients with BAV undergoing AVR because of severe aortic stenosis or aortic regurgitation when the diameter of the ascending aorta is greater than 4.5 cm (Class of recommendation IIa; Level of evidence C-EO).  Citation: Gae Bon, Kristen Loader, et al. Surgery for aortic dilatation in patients with bicuspid aortic valves. A statement of clarification from the [employer]/American Heart Association Task Force on Clinical Practice Guidelines. [Published online ahead of print February 06, 2014].  Circulation. doi: 10.1161/CIR.0000000000000331.   Delight Ovens MD      [address] [place] [place] [address] Office [phone]   Beeper [phone]  01/17/2016 7:16 AM

## 2016-01-17 NOTE — Progress Notes (Signed)
  Echocardiogram Echocardiogram Transesophageal has been performed.  Sheralyn BoatmanWest, Saylee Sherrill R 01/17/2016, 9:00 AM

## 2016-01-17 NOTE — Anesthesia Procedure Notes (Addendum)
Procedure Name: Intubation Date/Time: 01/17/2016 8:00 AM Performed by: Wray KearnsFOLEY, Havilah Topor A Pre-anesthesia Checklist: Patient identified, Emergency Drugs available, Suction available and Patient being monitored Patient Re-evaluated:Patient Re-evaluated prior to inductionOxygen Delivery Method: Circle System Utilized Preoxygenation: Pre-oxygenation with 100% oxygen Intubation Type: IV induction Ventilation: Oral airway inserted - appropriate to patient size and Two handed mask ventilation required Laryngoscope Size: Glidescope Grade View: Grade I Tube type: Oral Tube size: 8.0 mm Number of attempts: 1 Airway Equipment and Method: Video-laryngoscopy and Rigid stylet Placement Confirmation: ETT inserted through vocal cords under direct vision,  positive ETCO2 and breath sounds checked- equal and bilateral Secured at: 20 cm Tube secured with: Tape Dental Injury: Teeth and Oropharynx as per pre-operative assessment  Comments: Intubation performed by Antonietta BreachKpike, SRNA.

## 2016-01-17 NOTE — Transfer of Care (Signed)
Immediate Anesthesia Transfer of Care Note  Patient: Angela MayoLinda M Dawson  Procedure(s) Performed: Procedure(s): AORTIC VALVE REPLACEMENT (AVR) USING 21MM EDWARDS MAGNA EASE PERICARDIAL BIOPROSTHESIS VALVE (N/A) REPLACEMENT ASCENDING AORTA USING HEMASHIELD PLATINUM 28MM WOVEN DOUBLE VELOUR VASCULAR GRAFT (N/A) TRANSESOPHAGEAL ECHOCARDIOGRAM (TEE) (N/A)  Patient Location: SICU  Anesthesia Type:General  Level of Consciousness: sedated and Patient remains intubated per anesthesia plan  Airway & Oxygen Therapy: Patient remains intubated per anesthesia plan and Patient placed on Ventilator (see vital sign flow sheet for setting)  Post-op Assessment: Report given to RN and Post -op Vital signs reviewed and stable  Post vital signs: Reviewed and stable  Last Vitals:  Vitals:   01/17/16 0601  BP: (!) 148/71  Pulse: 65  Resp: 18  Temp: 36.8 C    Last Pain:  Vitals:   01/17/16 0601  TempSrc: Oral         Complications: No apparent anesthesia complications

## 2016-01-17 NOTE — Anesthesia Preprocedure Evaluation (Addendum)
Anesthesia Evaluation  Patient identified by MRN, date of birth, ID band Patient awake    Reviewed: Allergy & Precautions, NPO status , Patient's Chart, lab work & pertinent test results  History of Anesthesia Complications Negative for: history of anesthetic complications  Airway Mallampati: IV  TM Distance: >3 FB Neck ROM: Limited    Dental  (+) Dental Advisory Given, Missing   Pulmonary shortness of breath, former smoker (quit 1975),    breath sounds clear to auscultation       Cardiovascular hypertension, Pt. on home beta blockers and Pt. on medications + CAD (very mild, non-obstructive ASCAD) and + Peripheral Vascular Disease (ascending aortic aneurysm, 4.6 x4.6)   Rhythm:Regular Rate:Normal  ECHO:  EF 60-65%, probable bicuspid aortic valve with Mean gradient (S): 34 mm Hg. Peak gradient (S): 61 mm Hg, peak velocity 390 cm/s   Neuro/Psych negative neurological ROS     GI/Hepatic negative GI ROS, Neg liver ROS,   Endo/Other  Morbid obesity  Renal/GU negative Renal ROS     Musculoskeletal   Abdominal (+) + obese,   Peds  Hematology negative hematology ROS (+)   Anesthesia Other Findings   Reproductive/Obstetrics                            Anesthesia Physical Anesthesia Plan  ASA: III  Anesthesia Plan: General   Post-op Pain Management:    Induction: Intravenous  Airway Management Planned: Oral ETT and Video Laryngoscope Planned  Additional Equipment: Arterial line, PA Cath, TEE and Ultrasound Guidance Line Placement  Intra-op Plan: Utilization Of Total Body Hypothermia per surgeon request  Post-operative Plan: Post-operative intubation/ventilation  Informed Consent: I have reviewed the patients History and Physical, chart, labs and discussed the procedure including the risks, benefits and alternatives for the proposed anesthesia with the patient or authorized representative  who has indicated his/her understanding and acceptance.   Dental advisory given  Plan Discussed with: CRNA and Surgeon  Anesthesia Plan Comments: (Plan routine monitors, A line, PA cath, GETA with VideoGlide intubation, TEE, post op ventilation)        Anesthesia Quick Evaluation

## 2016-01-17 NOTE — Brief Op Note (Signed)
01/17/2016  12:29 PM  PATIENT:  Angela Dawson  70 y.o. female  PRE-OPERATIVE DIAGNOSIS:  SEVERE AS DILATED ASCENDING AORTA  POST-OPERATIVE DIAGNOSIS:  SEVERE AS DILATED ASCENDING AORTA  PROCEDURE:  Procedure(s):  AORTIC VALVE REPLACEMENT (AVR)  -21 mm Edwards Magna Ease Pericardial Tissue Valve  REPLACEMENT ASCENDING AORTA -28 mm Hemashield Graft  TRANSESOPHAGEAL ECHOCARDIOGRAM (TEE) (N/A)  SURGEON:  Surgeon(s) and Role:    * Delight OvensEdward B Gerhardt, MD - Primary  PHYSICIAN ASSISTANT: Grason Brailsford PA-C  ANESTHESIA:   general  EBL:  Total I/O In: 1000 [I.V.:1000] Out: 1450 [Urine:1450]  BLOOD ADMINISTERED: CELLSAVER  DRAINS: Mediastinal Chest Drains   LOCAL MEDICATIONS USED:  NONE  SPECIMEN:  Source of Specimen:  Ascending Aorta, Bicuspid Aortic Valve leafelts  DISPOSITION OF SPECIMEN:  PATHOLOGY  COUNTS:  YES  TOURNIQUET:  * No tourniquets in log *  DICTATION: .Dragon Dictation  PLAN OF CARE: Admit to inpatient   PATIENT DISPOSITION:  ICU - intubated and hemodynamically stable.   Delay start of Pharmacological VTE agent (>24hrs) due to surgical blood loss or risk of bleeding: yes

## 2016-01-17 NOTE — Anesthesia Procedure Notes (Addendum)
Central Venous Catheter Insertion Performed by: anesthesiologist 01/17/2016 7:00 AM Patient location: Pre-op. Preanesthetic checklist: patient identified, IV checked, site marked, risks and benefits discussed, surgical consent, monitors and equipment checked, pre-op evaluation, timeout performed and anesthesia consent Position: Trendelenburg Lidocaine 1% used for infiltration Landmarks identified and Seldinger technique used Catheter size: 8.5 Fr Central line and PA cath was placed.Sheath introducer Swan type and PA catheter depth:thermodilationProcedure performed using ultrasound guided technique. Attempts: 1 Following insertion, line sutured, dressing applied and Biopatch. Post procedure assessment: blood return through all ports, free fluid flow and no air. Patient tolerated the procedure well with no immediate complications.

## 2016-01-17 NOTE — Progress Notes (Signed)
[facility] BRIEF SICU PROGRESS NOTE  Day of Surgery  S/P Procedure(s) (LRB): AORTIC VALVE REPLACEMENT (AVR) USING 21MM EDWARDS MAGNA EASE PERICARDIAL BIOPROSTHESIS VALVE (N/A) REPLACEMENT ASCENDING AORTA USING HEMASHIELD PLATINUM 28MM WOVEN DOUBLE VELOUR VASCULAR GRAFT (N/A) TRANSESOPHAGEAL ECHOCARDIOGRAM (TEE) (N/A)   Sedated on vent AAI paced w/ stable hemodynamics on low dose dopamine O2 sat 100% Chest tube output low UOP > 100 mL/hr Labs okay  Plan: Continue routine early postop  Purcell Nailslarence H Riyana Biel, MD 01/17/2016 6:28 PM

## 2016-01-17 NOTE — Progress Notes (Signed)
Called Gerhardt and reported abnormal ABG and cardiac index lower than 1.4. Received order to increase RR (22) and Tidal Volume (450)

## 2016-01-18 ENCOUNTER — Encounter (HOSPITAL_COMMUNITY): Payer: Self-pay | Admitting: Cardiothoracic Surgery

## 2016-01-18 ENCOUNTER — Inpatient Hospital Stay (HOSPITAL_COMMUNITY): Payer: Medicare Other

## 2016-01-18 LAB — CBC
HEMATOCRIT: 30 % — AB (ref 36.0–46.0)
HEMATOCRIT: 31 % — AB (ref 36.0–46.0)
Hemoglobin: 10.4 g/dL — ABNORMAL LOW (ref 12.0–15.0)
Hemoglobin: 10.6 g/dL — ABNORMAL LOW (ref 12.0–15.0)
MCH: 32.8 pg (ref 26.0–34.0)
MCH: 33.1 pg (ref 26.0–34.0)
MCHC: 34.7 g/dL (ref 30.0–36.0)
MCHC: 34.2 g/dL (ref 30.0–36.0)
MCV: 94.6 fL (ref 78.0–100.0)
MCV: 96.9 fL (ref 78.0–100.0)
PLATELETS: 133 10*3/uL — AB (ref 150–400)
Platelets: 131 10*3/uL — ABNORMAL LOW (ref 150–400)
RBC: 3.17 MIL/uL — ABNORMAL LOW (ref 3.87–5.11)
RBC: 3.2 MIL/uL — ABNORMAL LOW (ref 3.87–5.11)
RDW: 12.5 % (ref 11.5–15.5)
RDW: 12.7 % (ref 11.5–15.5)
WBC: 10.9 10*3/uL — ABNORMAL HIGH (ref 4.0–10.5)
WBC: 13.7 10*3/uL — ABNORMAL HIGH (ref 4.0–10.5)

## 2016-01-18 LAB — GLUCOSE, CAPILLARY
Glucose-Capillary: 111 mg/dL — ABNORMAL HIGH (ref 65–99)
Glucose-Capillary: 114 mg/dL — ABNORMAL HIGH (ref 65–99)
Glucose-Capillary: 117 mg/dL — ABNORMAL HIGH (ref 65–99)
Glucose-Capillary: 117 mg/dL — ABNORMAL HIGH (ref 65–99)
Glucose-Capillary: 120 mg/dL — ABNORMAL HIGH (ref 65–99)
Glucose-Capillary: 121 mg/dL — ABNORMAL HIGH (ref 65–99)
Glucose-Capillary: 123 mg/dL — ABNORMAL HIGH (ref 65–99)
Glucose-Capillary: 125 mg/dL — ABNORMAL HIGH (ref 65–99)
Glucose-Capillary: 127 mg/dL — ABNORMAL HIGH (ref 65–99)
Glucose-Capillary: 129 mg/dL — ABNORMAL HIGH (ref 65–99)
Glucose-Capillary: 131 mg/dL — ABNORMAL HIGH (ref 65–99)
Glucose-Capillary: 131 mg/dL — ABNORMAL HIGH (ref 65–99)
Glucose-Capillary: 133 mg/dL — ABNORMAL HIGH (ref 65–99)
Glucose-Capillary: 169 mg/dL — ABNORMAL HIGH (ref 65–99)

## 2016-01-18 LAB — BASIC METABOLIC PANEL
Anion gap: 4 — ABNORMAL LOW (ref 5–15)
BUN: 10 mg/dL (ref 6–20)
CALCIUM: 6.8 mg/dL — AB (ref 8.9–10.3)
CO2: 24 mmol/L (ref 22–32)
CREATININE: 0.78 mg/dL (ref 0.44–1.00)
Chloride: 106 mmol/L (ref 101–111)
Glucose, Bld: 127 mg/dL — ABNORMAL HIGH (ref 65–99)
Potassium: 3.5 mmol/L (ref 3.5–5.1)
SODIUM: 134 mmol/L — AB (ref 135–145)

## 2016-01-18 LAB — POCT I-STAT 3, ART BLOOD GAS (G3+)
Acid-Base Excess: 3 mmol/L — ABNORMAL HIGH (ref 0.0–2.0)
Acid-base deficit: 1 mmol/L (ref 0.0–2.0)
Acid-base deficit: 1 mmol/L (ref 0.0–2.0)
Bicarbonate: 25.4 mmol/L (ref 20.0–28.0)
Bicarbonate: 23.7 mmol/L (ref 20.0–28.0)
Bicarbonate: 23.5 mmol/L (ref 20.0–28.0)
O2 Saturation: 99 %
O2 Saturation: 97 %
O2 Saturation: 96 %
Patient temperature: 99.7
Patient temperature: 37.2
Patient temperature: 37.2
TCO2: 26 mmol/L (ref 0–100)
TCO2: 25 mmol/L (ref 0–100)
TCO2: 25 mmol/L (ref 0–100)
pCO2 arterial: 33.2 mmHg (ref 32.0–48.0)
pCO2 arterial: 40.7 mmHg (ref 32.0–48.0)
pCO2 arterial: 38.6 mmHg (ref 32.0–48.0)
pH, Arterial: 7.495 — ABNORMAL HIGH (ref 7.350–7.450)
pH, Arterial: 7.375 (ref 7.350–7.450)
pH, Arterial: 7.394 (ref 7.350–7.450)
pO2, Arterial: 134 mmHg — ABNORMAL HIGH (ref 83.0–108.0)
pO2, Arterial: 91 mmHg (ref 83.0–108.0)
pO2, Arterial: 80 mmHg — ABNORMAL LOW (ref 83.0–108.0)

## 2016-01-18 LAB — POCT I-STAT, CHEM 8
BUN: 10 mg/dL (ref 6–20)
Calcium, Ion: 0.97 mmol/L — ABNORMAL LOW (ref 1.15–1.40)
Chloride: 98 mmol/L — ABNORMAL LOW (ref 101–111)
Creatinine, Ser: 0.8 mg/dL (ref 0.44–1.00)
Glucose, Bld: 146 mg/dL — ABNORMAL HIGH (ref 65–99)
HCT: 31 % — ABNORMAL LOW (ref 36.0–46.0)
Hemoglobin: 10.5 g/dL — ABNORMAL LOW (ref 12.0–15.0)
Potassium: 4.1 mmol/L (ref 3.5–5.1)
Sodium: 136 mmol/L (ref 135–145)
TCO2: 24 mmol/L (ref 0–100)

## 2016-01-18 LAB — MAGNESIUM
MAGNESIUM: 2 mg/dL (ref 1.7–2.4)
Magnesium: 2.3 mg/dL (ref 1.7–2.4)

## 2016-01-18 LAB — CREATININE, SERUM
Creatinine, Ser: 0.89 mg/dL (ref 0.44–1.00)
GFR calc Af Amer: >60 mL/min (ref >60)
GFR calc non Af Amer: >60 mL/min (ref >60)

## 2016-01-18 MED ORDER — POTASSIUM CHLORIDE 20 MEQ/15ML (10%) PO SOLN
20.0000 meq | Freq: Once | ORAL | Status: AC
  Administered 2016-01-18: 20 meq via ORAL
  Filled 2016-01-18: qty 15

## 2016-01-18 MED ORDER — FUROSEMIDE 10 MG/ML IJ SOLN
40.0000 mg | Freq: Once | INTRAMUSCULAR | Status: AC
  Administered 2016-01-18: 40 mg via INTRAVENOUS
  Filled 2016-01-18: qty 4

## 2016-01-18 MED ORDER — INSULIN DETEMIR 100 UNIT/ML ~~LOC~~ SOLN
15.0000 [IU] | Freq: Once | SUBCUTANEOUS | Status: AC
  Administered 2016-01-18: 15 [IU] via SUBCUTANEOUS
  Filled 2016-01-18: qty 0.15

## 2016-01-18 MED ORDER — ENOXAPARIN SODIUM 30 MG/0.3ML ~~LOC~~ SOLN
30.0000 mg | SUBCUTANEOUS | Status: DC
  Administered 2016-01-18 – 2016-01-21 (×4): 30 mg via SUBCUTANEOUS
  Filled 2016-01-18 (×4): qty 0.3

## 2016-01-18 MED ORDER — INSULIN ASPART 100 UNIT/ML ~~LOC~~ SOLN
0.0000 [IU] | SUBCUTANEOUS | Status: DC
Start: 2016-01-18 — End: 2016-01-19
  Administered 2016-01-18 (×2): 2 [IU] via SUBCUTANEOUS
  Administered 2016-01-18: 4 [IU] via SUBCUTANEOUS
  Administered 2016-01-19 (×2): 2 [IU] via SUBCUTANEOUS

## 2016-01-18 MED ORDER — INSULIN DETEMIR 100 UNIT/ML ~~LOC~~ SOLN
15.0000 [IU] | Freq: Every day | SUBCUTANEOUS | Status: DC
  Filled 2016-01-18: qty 0.15

## 2016-01-18 MED ORDER — POTASSIUM CHLORIDE 10 MEQ/50ML IV SOLN
10.0000 meq | INTRAVENOUS | Status: AC | PRN
  Administered 2016-01-18 (×3): 10 meq via INTRAVENOUS

## 2016-01-18 NOTE — Op Note (Signed)
[name]:  Dawson, Angela                ACCOUNT NO.:  [id number]  MEDICAL RECORD NO.:  [id number]  LOCATION:  2S05C                        FACILITY:  [facility]  PHYSICIAN:  [name], MD    DATE OF BIRTH:  1945/08/04  DATE OF PROCEDURE:  01/17/2016 DATE OF DISCHARGE:                              OPERATIVE REPORT   PREOPERATIVE DIAGNOSES:  Severe aortic stenosis, symptomatic and dilated ascending aorta.  POSTOPERATIVE DIAGNOSES:  Severe aortic stenosis, symptomatic and dilated ascending aorta.  SURGICAL PROCEDURE:  Aortic valve replacement with a pericardial tissue valve, Edwards Lifesciences, model 3300TFX 21 mm, serial #[id number], and supracoronary replacement of ascending aorta.  SURGEON:  Sheliah PlaneEdward [name], [name]  FIRST ASSISTANT:  [name], PA.  BRIEF HISTORY:  The patient is a 70 year old female followed in the office initially when she was found to have a dilated ascending aorta. Echocardiogram revealed that she also had a bicuspid aorta.  Over time, she became increasingly symptomatic from aortic stenosis with shortness of breath just walking across the parking lot.  Echocardiogram revealed progressive aortic stenosis with a valve area of 0.89 , peak gradients of 61 mm mean gradient 34, peak velocity  390 cm/se.  With her progressive aortic stenosis which was symptomatic with a bicuspid aortic valve and ascending aorta dilated to 4.6 cm. Replacement of aortic valve and replacement of the ascending aorta was recommended to the patient who agreed and signed informed consent.  I had a discussion of mechanical versus tissue valve.  It was discussed with her but she did not wish to have a mechanical valve and take Coumadin.  DESCRIPTION OF PROCEDURE:  With Swan-Ganz and arterial line monitors in place, the patient underwent general endotracheal anesthesia.  Because of her short neck, a GlideScope was used but without difficulty intubating her.  The skin of the chest and legs  was prepped with Betadine and draped in usual sterile manner.  A TEE probe was placed by Dr. [name].  The findings are in a separate note but confirmed a bicuspid aortic valve with fusion of the right and left coronary cusps. Valve area estimated at 0.8 cm.  With appropriate time-out performed, we then proceeded with standard median sternotomy.  With her truncal obesity, this was somewhat difficult.  Sternotomy was performed.  With retractors in place, we obtained good visualization.  The pericardium was opened, the mid ascending aorta was dilated to as noted on CT about 4.6 cm.  The distal ascending aorta was relatively normal in size.  The aortic root was at 3 cm.  We decided that we had sufficient room to place a cross-clamp distally, so the patient was systemically heparinized.  We cannulated the aorta high on the arch adjacent to the takeoff of the innominate artery.  A dual stage venous cannula was placed.  Retrograde cardioplegic catheter was placed.  The patient was then placed on cardiopulmonary bypass 2.4 L/min/m2.  Right superior pulmonary vein vent was placed.  The patient's body temperature was then cooled to 32 degrees.  Aortic crossclamp was applied and 500 mL cold blood potassium cardioplegia was administered through an aortic root vent cardioplegia needle introduced into the ascending aorta. Additional retrograde cardioplegia was  also administered.  We then proceeded to resect the dilated portion of the ascending aorta.  The aorta was opened approximately just at the sinotubular ridge.  We then with careful inspection of the aortic valve was a bicuspid valve highly calcified with anterior-posterior orientation of the opening.  The ostium of the right coronary artery was small, the left main ostium was without disease.  We then proceeded with sharp excision of the aortic valve and debridement of the anulus.  At this level, the aortic root was not dilated and was not felt  beneficial to do a complete Bentall.  A 21 Magna Ease sizer seated well.  A #2 Tycron pledgeted sutures were placed circumferentially around the anulus with pledgets on the ventricular surface.  Care was taken to remove all loose calcific debris.  With a total of 13 sutures, the pericardial tissue valve Edwards Lifesciences, model 3300TFX 21 mm, serial #[id number] was secured in place and seated well.  There was no impingement on the right or left coronary ostium. Intermittently, additional cold retrograde cardioplegia was given.  We then selected a 28-mm Hemashield platinum double floor vascular graft and trimmed to appropriate length with a felt strip on the aortic surface and a running 3-0 Prolene.  A proximal anastomosis was created taking particular care around the ostium of the right coronary takeoff to not compromise it.  The graft was then trimmed to the appropriate length with a slight taper, and the distal anastomosis was performed in a similar fashion.  CoSeal was placed on the suture lines.  An aortic root vent cardioplegia needle was placed with the pledget suture in the graft for de-airing purposes.  The heart was allowed to passively fill and de-air.  Warm retrograde cardioplegia was administered.  Aortic cross-clamp was then removed with a total cross-clamp time of 134 minutes.  The patient after appeared to be rewarming spontaneously converted to a sinus rhythm.  She was atrially paced at increased rate. Sites of anastomosis were inspected.  Several additional 4-0 Prolene pledgeted sutures were placed along the distal anastomosis.  TEE showed good function of the valve without perivalvular leak.  The superior pulmonary vein vent and retrograde cardioplegia catheter were removed. With the patient's body temperature rewarmed to 37 degrees, she was then ventilated and weaned from cardiopulmonary bypass without difficulty. She remained hemodynamically stable, was decannulated in  the usual fashion.  Protamine sulfate was administered.  With the operative field hemostatic, two Blake drains were left in place.  Atrial and ventricular pacing wires had been applied.  Pericardium was loosely reapproximated. Sternum was closed with #6 stainless steel wire.  The subcutaneous tissue was closed in 2 layers with interrupted 0 Vicryl, running 2-0 Vicryl and a 3-0 subcuticular stitch.  Dry dressings were applied. Sponge and needle count were correct at the completion of the procedure. The patient tolerated the procedure without obvious complication.  She did not require any blood bank blood products during the procedure.  She was transferred to the Surgical Intensive Care Unit for further postoperative care.     Sheliah PlaneEdward Skylin Kennerson, MD     EG/MEDQ  D:  01/18/2016  T:  01/18/2016  Job:  [id number]

## 2016-01-18 NOTE — Anesthesia Postprocedure Evaluation (Signed)
Anesthesia Post Note  Patient: Angela Dawson  Procedure(s) Performed: Procedure(s) (LRB): AORTIC VALVE REPLACEMENT (AVR) USING 21MM EDWARDS MAGNA EASE PERICARDIAL BIOPROSTHESIS VALVE (N/A) REPLACEMENT ASCENDING AORTA USING HEMASHIELD PLATINUM 28MM WOVEN DOUBLE VELOUR VASCULAR GRAFT (N/A) TRANSESOPHAGEAL ECHOCARDIOGRAM (TEE) (N/A)  Patient location during evaluation: SICU Anesthesia Type: General Level of consciousness: awake and alert, oriented and patient cooperative Pain management: pain level controlled Vital Signs Assessment: post-procedure vital signs reviewed and stable Respiratory status: spontaneous breathing, nonlabored ventilation, respiratory function stable and patient connected to face mask oxygen (extubated this am) Cardiovascular status: blood pressure returned to baseline and stable Postop Assessment: no signs of nausea or vomiting (ambulated today) Anesthetic complications: no    Last Vitals:  Vitals:   01/18/16 1655 01/18/16 1700  BP:  105/60  Pulse:  77  Resp:  19  Temp: 36.6 C     Last Pain:  Vitals:   01/18/16 1655  TempSrc: Oral  PainSc:                  Melita Villalona,E. Celeste Tavenner

## 2016-01-18 NOTE — Progress Notes (Signed)
Patient ambulated in hallway 100 ft with wheelchair, patient stopped once to rest, patient tolerated walk over all. Patient back in chair with call bell in reach. Will continue to monitor.  Hermina BartersBOWMAN, Laural Eiland M, RN

## 2016-01-18 NOTE — Progress Notes (Signed)
Patient ID: KANESHIA CATER, female   DOB: 07-11-1945, 70 y.o.   MRN: [id number] TCTS DAILY ICU PROGRESS NOTE                   [address]            [place] [address]          [phone]   1 Day Post-Op Procedure(s) (LRB): AORTIC VALVE REPLACEMENT (AVR) USING 21MM EDWARDS MAGNA EASE PERICARDIAL BIOPROSTHESIS VALVE (N/A) REPLACEMENT ASCENDING AORTA USING HEMASHIELD PLATINUM 28MM WOVEN DOUBLE VELOUR VASCULAR GRAFT (N/A) TRANSESOPHAGEAL ECHOCARDIOGRAM (TEE) (N/A)  Total Length of Stay:  LOS: 1 day   Subjective: Awake, follows commands , still on vent "sleppy" during the night  Objective: Vital signs in last 24 hours: Temp:  [97 F (36.1 C)-99.9 F (37.7 C)] 99.5 F (37.5 C) (11/14 0700) Pulse Rate:  [88-90] 88 (11/14 0700) Cardiac Rhythm: Atrial paced (11/14 0400) Resp:  [0-22] 22 (11/14 0700) BP: (103-138)/(49-67) 117/62 (11/14 0700) SpO2:  [97 %-100 %] 99 % (11/14 0700) Arterial Line BP: (89-137)/(47-68) 107/47 (11/14 0700) FiO2 (%):  [50 %] 50 % (11/14 0400) Weight:  [225 lb 8.5 oz (102.3 kg)] 225 lb 8.5 oz (102.3 kg) (11/14 0500)  Filed Weights   01/17/16 0601 01/18/16 0500  Weight: 218 lb (98.9 kg) 225 lb 8.5 oz (102.3 kg)    Weight change: 7 lb 8.5 oz (3.416 kg)   Hemodynamic parameters for last 24 hours: PAP: (25-40)/(15-26) 29/16 CO:  [2.6 L/min-4 L/min] 4 L/min CI:  [1.3 L/min/m2-2.1 L/min/m2] 2.1 L/min/m2  Intake/Output from previous day: 11/13 0701 - 11/14 0700 In: 5510.3 [I.V.:4428.3; Blood:482; IV Piggyback:600] Out: 1062 [Urine:3320; Blood:1850; Chest Tube:280]  Intake/Output this shift: No intake/output data recorded.  Current Meds: Scheduled Meds: . acetaminophen  1,000 mg Oral Q6H   Or  . acetaminophen (TYLENOL) oral liquid 160 mg/5 mL  1,000 mg Per Tube Q6H  . aspirin EC  325 mg Oral Daily   Or  . aspirin  324 mg Per Tube Daily  . bisacodyl  10 mg Oral Daily   Or  . bisacodyl  10 mg Rectal Daily  . chlorhexidine  gluconate (MEDLINE KIT)  15 mL Mouth Rinse BID  . docusate sodium  200 mg Oral Daily  . insulin regular  0-10 Units Intravenous TID WC  . levofloxacin (LEVAQUIN) IV  750 mg Intravenous Q24H  . loratadine  10 mg Oral Daily  . mouth rinse  15 mL Mouth Rinse QID  . metoCLOPramide (REGLAN) injection  10 mg Intravenous Q6H  . metoprolol tartrate  12.5 mg Oral BID   Or  . metoprolol tartrate  12.5 mg Per Tube BID  . [START ON 01/19/2016] pantoprazole  40 mg Oral Daily  . sodium chloride flush  3 mL Intravenous Q12H   Continuous Infusions: . sodium chloride 20 mL/hr at 01/18/16 0400  . sodium chloride    . sodium chloride 10 mL/hr at 01/17/16 2100  . dexmedetomidine 0 mcg/kg/hr (01/17/16 1858)  . DOPamine 2.5 mcg/kg/min (01/18/16 0400)  . insulin (NOVOLIN-R) infusion 3 Units/hr (01/18/16 0700)  . lactated ringers 20 mL/hr at 01/18/16 0400  . lactated ringers 20 mL/hr at 01/17/16 2100  . nitroGLYCERIN Stopped (01/17/16 1515)  . phenylephrine (NEO-SYNEPHRINE) Adult infusion 20 mcg/min (01/18/16 0700)   PRN Meds:.sodium chloride, albumin human, lactated ringers, metoprolol, midazolam, morphine injection, ondansetron (ZOFRAN) IV, oxyCODONE, potassium chloride, sodium chloride flush, traMADol  General appearance: alert, cooperative and no distress  Neurologic: intact Heart: regular rate and rhythm, S1, S2 normal, no murmur, click, rub or gallop Lungs: diminished breath sounds bibasilar Abdomen: soft, non-tender; bowel sounds normal; no masses,  no organomegaly Extremities: extremities normal, atraumatic, no cyanosis or edema and Homans sign is negative, no sign of DVT Wound: sternum stable   Lab Results: CBC: Recent Labs  01/17/16 2045 01/17/16 2051 01/18/16 0400  WBC 12.1*  --  10.9*  HGB 11.2* 11.2* 10.4*  HCT 32.6* 33.0* 30.0*  PLT 134*  --  133*   BMET:  Recent Labs  01/17/16 2051 01/18/16 0400  NA 138 134*  K 4.0 3.5  CL 101 106  CO2  --  24  GLUCOSE 141* 127*  BUN 13  10  CREATININE 0.60 0.78  CALCIUM  --  6.8*    CMET: Lab Results  Component Value Date   WBC 10.9 (H) 01/18/2016   HGB 10.4 (L) 01/18/2016   HCT 30.0 (L) 01/18/2016   PLT 133 (L) 01/18/2016   GLUCOSE 127 (H) 01/18/2016   CHOL 211 (H) 11/30/2015   TRIG 114 11/30/2015   HDL 49 11/30/2015   LDLCALC 139 (H) 11/30/2015   ALT 22 01/14/2016   AST 30 01/14/2016   NA 134 (L) 01/18/2016   K 3.5 01/18/2016   CL 106 01/18/2016   CREATININE 0.78 01/18/2016   BUN 10 01/18/2016   CO2 24 01/18/2016   INR 1.73 01/17/2016   HGBA1C 5.3 01/14/2016    PT/INR:  Recent Labs  01/17/16 1515  LABPROT 20.5*  INR 1.73   Radiology: Dg Chest Port 1 View  Result Date: 01/18/2016 CLINICAL DATA:  70 year old female post aortic valve replacement. Subsequent encounter. EXAM: PORTABLE CHEST 1 VIEW COMPARISON:  01/17/2016 FINDINGS: Post median sternotomy and aortic valve replacement.  Cardiomegaly. Endotracheal tube tip 2 cm above the carina. Right-sided Swan-Ganz catheter tip main pulmonary artery/right main pulmonary artery junction level. Nasogastric tube courses below the diaphragm. Tip is not included on the present exam. Mediastinal drains in place. No gross pneumothorax. Left base subsegmental atelectasis. Central pulmonary vascular prominence. IMPRESSION: Post aortic valve replacement.  Mild cardiomegaly. Left base subsegmental atelectasis. Mild central pulmonary vascular prominence. Endotracheal tube tip 2 cm above the carina. Electronically Signed   By: Genia Del [name]   On: 01/18/2016 06:53   Dg Chest Port 1 View  Result Date: 01/17/2016 CLINICAL DATA:  Status post aortic valve replacement. Former smoker. Obesity. EXAM: PORTABLE CHEST 1 VIEW COMPARISON:  PA and lateral chest x-ray of January 14, 2016 FINDINGS: The lungs are mildly hypoinflated. The retrocardiac density on the left is increased. There is partial obscuration of the left hemidiaphragm. The cardiac silhouette is enlarged. The  pulmonary vascularity is not engorged. The prosthetic aortic valve is faintly visible in in reasonable position radiographically. The endotracheal tube tip lies 3 point 4 cm above the carina. The Swan-Ganz catheter tip projects in the distal aspect of the main pulmonary outflow tract. The orogastric tube tip projects below the inferior margin of the image. A mediastinal drain is present with its tip projecting at approximately the T4 level. IMPRESSION: Mild hypoinflation. New left lower lobe atelectasis. Tiny left pleural effusion. No pneumothorax. Cardiomegaly without pulmonary edema. The support tubes are in reasonable position. Electronically Signed   By: David  Martinique [name]   On: 01/17/2016 16:17     Assessment/Plan: S/P Procedure(s) (LRB): AORTIC VALVE REPLACEMENT (AVR) USING 21MM EDWARDS MAGNA EASE PERICARDIAL BIOPROSTHESIS VALVE (N/A) REPLACEMENT ASCENDING AORTA USING HEMASHIELD PLATINUM 28MM  WOVEN DOUBLE VELOUR VASCULAR GRAFT (N/A) TRANSESOPHAGEAL ECHOCARDIOGRAM (TEE) (N/A) Mobilize Diuresis Diabetes control Continue foley due to strict I&O  Start weaning vent  Expected Acute  Blood - loss Anemia- no transfusion given post op    Grace Isaac 01/18/2016 7:25 AM

## 2016-01-18 NOTE — Procedures (Signed)
Extubation Procedure Note  Patient Details:   Name: Deitra MayoLinda M Lehan DOB: 11/16/1945 MRN: [id number]   Airway Documentation:     Evaluation  O2 sats: stable throughout Complications: No apparent complications Patient did tolerate procedure well. Bilateral Breath Sounds: Diminished, Clear   Yes  PT was extubated to a 3L [place]  PT was able to speak  NIF (-20) VC 7L  Sats are stable   RT to monitor   Falana Clagg, Duane LopeJeffrey D 01/18/2016, 9:20 AM

## 2016-01-18 NOTE — Progress Notes (Signed)
CT surgery PM Rounds  Up in chair resting comfortably Walked 100 feet Normal sinus rhythm O2 sat 95% P.m. labs satisfactory

## 2016-01-19 ENCOUNTER — Inpatient Hospital Stay (HOSPITAL_COMMUNITY): Payer: Medicare Other

## 2016-01-19 LAB — GLUCOSE, CAPILLARY
Glucose-Capillary: 117 mg/dL — ABNORMAL HIGH (ref 65–99)
Glucose-Capillary: 129 mg/dL — ABNORMAL HIGH (ref 65–99)
Glucose-Capillary: 130 mg/dL — ABNORMAL HIGH (ref 65–99)
Glucose-Capillary: 115 mg/dL — ABNORMAL HIGH (ref 65–99)
Glucose-Capillary: 128 mg/dL — ABNORMAL HIGH (ref 65–99)

## 2016-01-19 LAB — BASIC METABOLIC PANEL
Anion gap: 7 (ref 5–15)
BUN: 8 mg/dL (ref 6–20)
CO2: 26 mmol/L (ref 22–32)
Calcium: 7.2 mg/dL — ABNORMAL LOW (ref 8.9–10.3)
Chloride: 101 mmol/L (ref 101–111)
Creatinine, Ser: 0.74 mg/dL (ref 0.44–1.00)
GFR calc Af Amer: >60 mL/min (ref >60)
GFR calc non Af Amer: >60 mL/min (ref >60)
Glucose, Bld: 120 mg/dL — ABNORMAL HIGH (ref 65–99)
Potassium: 4 mmol/L (ref 3.5–5.1)
Sodium: 134 mmol/L — ABNORMAL LOW (ref 135–145)

## 2016-01-19 LAB — CBC
HCT: 29.5 % — ABNORMAL LOW (ref 36.0–46.0)
Hemoglobin: 9.9 g/dL — ABNORMAL LOW (ref 12.0–15.0)
MCH: 32.8 pg (ref 26.0–34.0)
MCHC: 33.6 g/dL (ref 30.0–36.0)
MCV: 97.7 fL (ref 78.0–100.0)
Platelets: 120 10*3/uL — ABNORMAL LOW (ref 150–400)
RBC: 3.02 MIL/uL — ABNORMAL LOW (ref 3.87–5.11)
RDW: 12.8 % (ref 11.5–15.5)
WBC: 13.2 10*3/uL — ABNORMAL HIGH (ref 4.0–10.5)

## 2016-01-19 MED ORDER — POTASSIUM CHLORIDE CRYS ER 20 MEQ PO TBCR
20.0000 meq | EXTENDED_RELEASE_TABLET | Freq: Every day | ORAL | Status: AC
  Administered 2016-01-19 – 2016-01-21 (×3): 20 meq via ORAL
  Filled 2016-01-19 (×3): qty 1

## 2016-01-19 MED ORDER — SODIUM CHLORIDE 0.9% FLUSH
3.0000 mL | Freq: Two times a day (BID) | INTRAVENOUS | Status: DC
  Administered 2016-01-19 – 2016-01-21 (×5): 3 mL via INTRAVENOUS

## 2016-01-19 MED ORDER — PANTOPRAZOLE SODIUM 40 MG PO TBEC
40.0000 mg | DELAYED_RELEASE_TABLET | Freq: Every day | ORAL | Status: DC
  Administered 2016-01-20 – 2016-01-22 (×3): 40 mg via ORAL
  Filled 2016-01-19 (×3): qty 1

## 2016-01-19 MED ORDER — SODIUM CHLORIDE 0.9 % IV SOLN: 250.0000 mL | INTRAVENOUS | Status: DC | PRN

## 2016-01-19 MED ORDER — BISACODYL 10 MG RE SUPP: 10.0000 mg | Freq: Every day | RECTAL | Status: DC | PRN

## 2016-01-19 MED ORDER — BISACODYL 5 MG PO TBEC
10.0000 mg | DELAYED_RELEASE_TABLET | Freq: Every day | ORAL | Status: DC | PRN
  Administered 2016-01-19: 10 mg via ORAL

## 2016-01-19 MED ORDER — ACETAMINOPHEN 325 MG PO TABS
650.0000 mg | ORAL_TABLET | Freq: Four times a day (QID) | ORAL | Status: DC | PRN
  Administered 2016-01-20 – 2016-01-21 (×2): 650 mg via ORAL
  Filled 2016-01-19 (×2): qty 2

## 2016-01-19 MED ORDER — INSULIN ASPART 100 UNIT/ML ~~LOC~~ SOLN
0.0000 [IU] | SUBCUTANEOUS | Status: DC
  Administered 2016-01-19 – 2016-01-20 (×3): 2 [IU] via SUBCUTANEOUS

## 2016-01-19 MED ORDER — MOVING RIGHT ALONG BOOK
Freq: Once | Status: AC
  Administered 2016-01-19: 09:00:00
  Filled 2016-01-19: qty 1

## 2016-01-19 MED ORDER — DOCUSATE SODIUM 100 MG PO CAPS
200.0000 mg | ORAL_CAPSULE | Freq: Every day | ORAL | Status: DC
  Administered 2016-01-19 – 2016-01-22 (×4): 200 mg via ORAL
  Filled 2016-01-19 (×3): qty 2

## 2016-01-19 MED ORDER — ONDANSETRON HCL 4 MG PO TABS: 4.0000 mg | ORAL_TABLET | Freq: Four times a day (QID) | ORAL | Status: DC | PRN

## 2016-01-19 MED ORDER — OXYCODONE HCL 5 MG PO TABS
5.0000 mg | ORAL_TABLET | ORAL | Status: DC | PRN
  Administered 2016-01-19 (×2): 10 mg via ORAL
  Filled 2016-01-19: qty 2

## 2016-01-19 MED ORDER — SODIUM CHLORIDE 0.9% FLUSH: 3.0000 mL | INTRAVENOUS | Status: DC | PRN

## 2016-01-19 MED ORDER — ASPIRIN EC 325 MG PO TBEC
325.0000 mg | DELAYED_RELEASE_TABLET | Freq: Every day | ORAL | Status: DC
  Administered 2016-01-19 – 2016-01-22 (×4): 325 mg via ORAL
  Filled 2016-01-19 (×3): qty 1

## 2016-01-19 MED ORDER — FUROSEMIDE 20 MG PO TABS
20.0000 mg | ORAL_TABLET | Freq: Every day | ORAL | Status: DC
  Administered 2016-01-19 – 2016-01-20 (×2): 20 mg via ORAL
  Filled 2016-01-19 (×2): qty 1

## 2016-01-19 MED ORDER — METOPROLOL TARTRATE 12.5 MG HALF TABLET
12.5000 mg | ORAL_TABLET | Freq: Two times a day (BID) | ORAL | Status: DC
  Administered 2016-01-19 – 2016-01-22 (×7): 12.5 mg via ORAL
  Filled 2016-01-19 (×6): qty 1

## 2016-01-19 MED ORDER — TRAMADOL HCL 50 MG PO TABS: 50.0000 mg | ORAL_TABLET | ORAL | Status: DC | PRN

## 2016-01-19 MED ORDER — ONDANSETRON HCL 4 MG/2ML IJ SOLN
4.0000 mg | Freq: Four times a day (QID) | INTRAMUSCULAR | Status: DC | PRN
  Administered 2016-01-22: 4 mg via INTRAVENOUS
  Filled 2016-01-19: qty 2

## 2016-01-19 NOTE — Progress Notes (Signed)
Patient arrived on the unit from 2S assesment completed see flowsheet, placed on tele ccmd notified patient oriented to room and staff, patient sitting quietly on the recliner call light within reach will continue to monitor

## 2016-01-19 NOTE — Progress Notes (Signed)
Patient ID: Angela Dawson, female   DOB: February 20, 1946, 70 y.o.   MRN: [id number] TCTS DAILY ICU PROGRESS NOTE                   [address]            [place] [address]          [phone]   2 Days Post-Op Procedure(s) (LRB): AORTIC VALVE REPLACEMENT (AVR) USING 21MM EDWARDS MAGNA EASE PERICARDIAL BIOPROSTHESIS VALVE (N/A) REPLACEMENT ASCENDING AORTA USING HEMASHIELD PLATINUM 28MM WOVEN DOUBLE VELOUR VASCULAR GRAFT (N/A) TRANSESOPHAGEAL ECHOCARDIOGRAM (TEE) (N/A)  Total Length of Stay:  LOS: 2 days   Subjective: Up to chair, alert , walked 100 feet   Objective: Vital signs in last 24 hours: Temp:  [97.9 F (36.6 C)-99.5 F (37.5 C)] 98.7 F (37.1 C) (11/15 0400) Pulse Rate:  [77-97] 85 (11/15 0700) Cardiac Rhythm: Normal sinus rhythm (11/15 0400) Resp:  [0-25] 23 (11/15 0700) BP: (91-141)/(43-74) 109/55 (11/15 0700) SpO2:  [95 %-100 %] 98 % (11/15 0700) Arterial Line BP: (97-147)/(45-69) 132/53 (11/14 1245) Weight:  [231 lb 4.2 oz (104.9 kg)] 231 lb 4.2 oz (104.9 kg) (11/15 0500)  Filed Weights   01/17/16 0601 01/18/16 0500 01/19/16 0500  Weight: 218 lb (98.9 kg) 225 lb 8.5 oz (102.3 kg) 231 lb 4.2 oz (104.9 kg)    Weight change: 5 lb 11.7 oz (2.6 kg)   Hemodynamic parameters for last 24 hours: PAP: (26-42)/(16-26) 34/20 CO:  [4.1 L/min] 4.1 L/min CI:  [2.1 L/min/m2] 2.1 L/min/m2  Intake/Output from previous day: 11/14 0701 - 11/15 0700 In: 1636.2 [P.O.:540; I.V.:446.2; IV Piggyback:650] Out: 1855 [OZHYQ:6578; Chest Tube:60]  Intake/Output this shift: No intake/output data recorded.  Current Meds: Scheduled Meds: . acetaminophen  1,000 mg Oral Q6H   Or  . acetaminophen (TYLENOL) oral liquid 160 mg/5 mL  1,000 mg Per Tube Q6H  . aspirin EC  325 mg Oral Daily   Or  . aspirin  324 mg Per Tube Daily  . bisacodyl  10 mg Oral Daily   Or  . bisacodyl  10 mg Rectal Daily  . chlorhexidine gluconate (MEDLINE KIT)  15 mL Mouth Rinse BID  .  docusate sodium  200 mg Oral Daily  . enoxaparin (LOVENOX) injection  30 mg Subcutaneous Q24H  . insulin aspart  0-24 Units Subcutaneous Q4H  . insulin detemir  15 Units Subcutaneous Daily  . insulin regular  0-10 Units Intravenous TID WC  . loratadine  10 mg Oral Daily  . mouth rinse  15 mL Mouth Rinse QID  . metoprolol tartrate  12.5 mg Oral BID   Or  . metoprolol tartrate  12.5 mg Per Tube BID  . pantoprazole  40 mg Oral Daily  . sodium chloride flush  3 mL Intravenous Q12H   Continuous Infusions: . sodium chloride 20 mL/hr at 01/18/16 0400  . sodium chloride    . sodium chloride 10 mL/hr at 01/17/16 2100  . dexmedetomidine 0 mcg/kg/hr (01/17/16 1858)  . DOPamine Stopped (01/18/16 1300)  . insulin (NOVOLIN-R) infusion Stopped (01/18/16 1300)  . lactated ringers Stopped (01/18/16 1500)  . lactated ringers Stopped (01/18/16 0800)  . nitroGLYCERIN Stopped (01/17/16 1515)  . phenylephrine (NEO-SYNEPHRINE) Adult infusion Stopped (01/18/16 0855)   PRN Meds:.sodium chloride, lactated ringers, metoprolol, midazolam, morphine injection, ondansetron (ZOFRAN) IV, oxyCODONE, sodium chloride flush, traMADol  General appearance: alert and cooperative Neurologic: intact Heart: regular rate and rhythm, S1, S2 normal, no murmur, click,  rub or gallop Lungs: diminished breath sounds bibasilar Abdomen: soft, non-tender; bowel sounds normal; no masses,  no organomegaly Extremities: extremities normal, atraumatic, no cyanosis or edema and Homans sign is negative, no sign of DVT Wound: wound dressing intact   Lab Results: CBC: Recent Labs  01/18/16 1631 01/19/16 0430  WBC 13.7* 13.2*  HGB 10.6* 9.9*  HCT 31.0* 29.5*  PLT 131* 120*   BMET:  Recent Labs  01/18/16 0400 01/18/16 1630 01/18/16 1631 01/19/16 0430  NA 134* 136  --  134*  K 3.5 4.1  --  4.0  CL 106 98*  --  101  CO2 24  --   --  26  GLUCOSE 127* 146*  --  120*  BUN 10 10  --  8  CREATININE 0.78 0.80 0.89 0.74  CALCIUM  6.8*  --   --  7.2*    CMET: Lab Results  Component Value Date   WBC 13.2 (H) 01/19/2016   HGB 9.9 (L) 01/19/2016   HCT 29.5 (L) 01/19/2016   PLT 120 (L) 01/19/2016   GLUCOSE 120 (H) 01/19/2016   CHOL 211 (H) 11/30/2015   TRIG 114 11/30/2015   HDL 49 11/30/2015   LDLCALC 139 (H) 11/30/2015   ALT 22 01/14/2016   AST 30 01/14/2016   NA 134 (L) 01/19/2016   K 4.0 01/19/2016   CL 101 01/19/2016   CREATININE 0.74 01/19/2016   BUN 8 01/19/2016   CO2 26 01/19/2016   INR 1.73 01/17/2016   HGBA1C 5.3 01/14/2016    PT/INR:  Recent Labs  01/17/16 1515  LABPROT 20.5*  INR 1.73   Radiology: No results found.   Assessment/Plan: S/P Procedure(s) (LRB): AORTIC VALVE REPLACEMENT (AVR) USING 21MM EDWARDS MAGNA EASE PERICARDIAL BIOPROSTHESIS VALVE (N/A) REPLACEMENT ASCENDING AORTA USING HEMASHIELD PLATINUM 28MM WOVEN DOUBLE VELOUR VASCULAR GRAFT (N/A) TRANSESOPHAGEAL ECHOCARDIOGRAM (TEE) (N/A) Mobilize Diuresis Diabetes control d/c tubes/lines Plan for transfer to step-down: see transfer orders Holding sinus    Grace Isaac 01/19/2016 7:43 AM

## 2016-01-19 NOTE — Progress Notes (Signed)
Called report to RN on [facility], patient due to void by 1730, no questions from receiving RN.  Hermina BartersBOWMAN, Emmylou Bieker M, RN

## 2016-01-19 NOTE — Progress Notes (Signed)
[address]       [place] [address]             [phone]                 2 Days Post-Op Procedure(s) (LRB): AORTIC VALVE REPLACEMENT (AVR) USING 21MM EDWARDS MAGNA EASE PERICARDIAL BIOPROSTHESIS VALVE (N/A) REPLACEMENT ASCENDING AORTA USING HEMASHIELD PLATINUM 28MM WOVEN DOUBLE VELOUR VASCULAR GRAFT (N/A) TRANSESOPHAGEAL ECHOCARDIOGRAM (TEE) (N/A)  LOS: 2 days   Subjective: Arrived on 2w   Objective: Vital signs in last 24 hours: Patient Vitals for the past 24 hrs:  BP Temp Temp src Pulse Resp SpO2 Weight  01/19/16 1728 117/67 98.6 F (37 C) Oral - 20 100 % -  01/19/16 1600 115/60 - - 82 13 92 % -  01/19/16 1541 - 98.5 F (36.9 C) Oral - - - -  01/19/16 1500 (!) 113/58 - - - 13 - -  01/19/16 1400 (!) 109/53 - - - (!) 24 - -  01/19/16 1300 106/62 - - - (!) 26 - -  01/19/16 1200 115/61 - - - (!) 24 - -  01/19/16 1135 - 99 F (37.2 C) Oral - - - -  01/19/16 1100 (!) 94/54 - - - 15 - -  01/19/16 1000 (!) 93/57 - - - 20 - -  01/19/16 0900 (!) 125/55 - - 88 (!) 26 94 % -  01/19/16 0800 (!) 112/51 - - 81 (!) 24 98 % -  01/19/16 0749 - 98.9 F (37.2 C) Oral - - - -  01/19/16 0700 (!) 109/55 - - 85 (!) 23 98 % -  01/19/16 0600 (!) 118/59 - - 87 20 98 % -  01/19/16 0500 (!) 121/57 - - 82 (!) 22 98 % 231 lb 4.2 oz (104.9 kg)  01/19/16 0400 105/60 98.7 F (37.1 C) Oral 78 20 98 % -  01/19/16 0300 (!) 133/59 - - 92 (!) 21 98 % -  01/19/16 0200 122/62 - - 80 17 99 % -  01/19/16 0100 122/62 - - 79 (!) 21 99 % -  01/19/16 0002 - 99.1 F (37.3 C) Oral - - - -  01/19/16 0000 119/65 - - 81 (!) 22 99 % -  01/18/16 2300 111/60 - - 81 (!) 23 98 % -  01/18/16 2200 115/61 - - 83 (!) 24 98 % -  01/18/16 2100 (!) 101/59 - - 83 (!) 22 99 % -  01/18/16 2000 116/62 - - 88 15 100 % -  01/18/16 1943 - 99.2 F (37.3 C) Oral - - - -  01/18/16 1900 109/62 - - 83 (!) 23 100 % -    Filed Weights   01/17/16 0601 01/18/16 0500 01/19/16 0500  Weight: 218 lb (98.9 kg)  225 lb 8.5 oz (102.3 kg) 231 lb 4.2 oz (104.9 kg)    Hemodynamic parameters for last 24 hours:    Intake/Output from previous day: 11/14 0701 - 11/15 0700 In: 1636.2 [P.O.:540; I.V.:446.2; IV Piggyback:650] Out: 1855 [QPYPP:5093; Chest Tube:60] Intake/Output this shift: Total I/O In: -  Out: 150 [Urine:150]  Scheduled Meds: . aspirin EC  325 mg Oral Daily  . chlorhexidine gluconate (MEDLINE KIT)  15 mL Mouth Rinse BID  . docusate sodium  200 mg Oral Daily  . enoxaparin (LOVENOX) injection  30 mg Subcutaneous Q24H  . furosemide  20 mg Oral Daily  . insulin aspart  0-24 Units Subcutaneous Q4H  .  loratadine  10 mg Oral Daily  . metoprolol tartrate  12.5 mg Oral BID  . [START ON 01/20/2016] pantoprazole  40 mg Oral QAC breakfast  . potassium chloride  20 mEq Oral Daily  . sodium chloride flush  3 mL Intravenous Q12H   Continuous Infusions: PRN Meds:.sodium chloride, acetaminophen, bisacodyl **OR** bisacodyl, ondansetron **OR** ondansetron (ZOFRAN) IV, oxyCODONE, sodium chloride flush, traMADol    Lab Results: CBC: Recent Labs  01/18/16 1631 01/19/16 0430  WBC 13.7* 13.2*  HGB 10.6* 9.9*  HCT 31.0* 29.5*  PLT 131* 120*   BMET:  Recent Labs  01/18/16 0400 01/18/16 1630 01/18/16 1631 01/19/16 0430  NA 134* 136  --  134*  K 3.5 4.1  --  4.0  CL 106 98*  --  101  CO2 24  --   --  26  GLUCOSE 127* 146*  --  120*  BUN 10 10  --  8  CREATININE 0.78 0.80 0.89 0.74  CALCIUM 6.8*  --   --  7.2*    PT/INR:  Recent Labs  01/17/16 1515  LABPROT 20.5*  INR 1.73     Radiology Dg Chest Port 1 View  Result Date: 01/19/2016 CLINICAL DATA:  Chest tube removal after cardiac surgery. EXAM: PORTABLE CHEST 1 VIEW COMPARISON:  Yesterday FINDINGS: Thoracic drain has been removed.  No visible pneumothorax. Worsening basilar aeration with indistinct diaphragm. Stable cardiopericardial enlargement. The patient is status post aortic valve replacement. Right IJ sheath in  unchanged position. A Swan-Ganz catheter has been removed. Tracheal and esophageal extubation. IMPRESSION: 1. Increased atelectasis after extubation. 2. Thoracic drain removal without visible pneumothorax. Electronically Signed   By: Angela Dawson [name]   On: 01/19/2016 08:29   Dg Chest Port 1 View  Result Date: 01/18/2016 CLINICAL DATA:  70 year old female post aortic valve replacement. Subsequent encounter. EXAM: PORTABLE CHEST 1 VIEW COMPARISON:  01/17/2016 FINDINGS: Post median sternotomy and aortic valve replacement.  Cardiomegaly. Endotracheal tube tip 2 cm above the carina. Right-sided Swan-Ganz catheter tip main pulmonary artery/right main pulmonary artery junction level. Nasogastric tube courses below the diaphragm. Tip is not included on the present exam. Mediastinal drains in place. No gross pneumothorax. Left base subsegmental atelectasis. Central pulmonary vascular prominence. IMPRESSION: Post aortic valve replacement.  Mild cardiomegaly. Left base subsegmental atelectasis. Mild central pulmonary vascular prominence. Endotracheal tube tip 2 cm above the carina. Electronically Signed   By: Angela Dawson [name]   On: 01/18/2016 06:53     Assessment/Plan: S/P Procedure(s) (LRB): AORTIC VALVE REPLACEMENT (AVR) USING 21MM EDWARDS MAGNA EASE PERICARDIAL BIOPROSTHESIS VALVE (N/A) REPLACEMENT ASCENDING AORTA USING HEMASHIELD PLATINUM 28MM WOVEN DOUBLE VELOUR VASCULAR GRAFT (N/A) TRANSESOPHAGEAL ECHOCARDIOGRAM (TEE) (N/A)  Stable on 2w, voiding , sinus rhythm    Angela Isaac MD 01/19/2016 6:05 PM

## 2016-01-20 ENCOUNTER — Inpatient Hospital Stay (HOSPITAL_COMMUNITY): Payer: Medicare Other

## 2016-01-20 LAB — GLUCOSE, CAPILLARY
Glucose-Capillary: 102 mg/dL — ABNORMAL HIGH (ref 65–99)
Glucose-Capillary: 105 mg/dL — ABNORMAL HIGH (ref 65–99)
Glucose-Capillary: 108 mg/dL — ABNORMAL HIGH (ref 65–99)
Glucose-Capillary: 109 mg/dL — ABNORMAL HIGH (ref 65–99)
Glucose-Capillary: 124 mg/dL — ABNORMAL HIGH (ref 65–99)
Glucose-Capillary: 140 mg/dL — ABNORMAL HIGH (ref 65–99)
Glucose-Capillary: 92 mg/dL (ref 65–99)

## 2016-01-20 LAB — BASIC METABOLIC PANEL
Anion gap: 8 (ref 5–15)
BUN: 12 mg/dL (ref 6–20)
CO2: 24 mmol/L (ref 22–32)
Calcium: 7.5 mg/dL — ABNORMAL LOW (ref 8.9–10.3)
Chloride: 101 mmol/L (ref 101–111)
Creatinine, Ser: 0.88 mg/dL (ref 0.44–1.00)
GFR calc Af Amer: >60 mL/min (ref >60)
GFR calc non Af Amer: >60 mL/min (ref >60)
Glucose, Bld: 121 mg/dL — ABNORMAL HIGH (ref 65–99)
Potassium: 4.4 mmol/L (ref 3.5–5.1)
Sodium: 133 mmol/L — ABNORMAL LOW (ref 135–145)

## 2016-01-20 LAB — CBC
HCT: 27.5 % — ABNORMAL LOW (ref 36.0–46.0)
Hemoglobin: 9.3 g/dL — ABNORMAL LOW (ref 12.0–15.0)
MCH: 33.2 pg (ref 26.0–34.0)
MCHC: 33.8 g/dL (ref 30.0–36.0)
MCV: 98.2 fL (ref 78.0–100.0)
Platelets: 131 10*3/uL — ABNORMAL LOW (ref 150–400)
RBC: 2.8 MIL/uL — ABNORMAL LOW (ref 3.87–5.11)
RDW: 12.7 % (ref 11.5–15.5)
WBC: 12.1 10*3/uL — ABNORMAL HIGH (ref 4.0–10.5)

## 2016-01-20 MED ORDER — FUROSEMIDE 10 MG/ML IJ SOLN
40.0000 mg | Freq: Once | INTRAMUSCULAR | Status: AC
  Administered 2016-01-20: 40 mg via INTRAVENOUS
  Filled 2016-01-20: qty 4

## 2016-01-20 NOTE — Progress Notes (Signed)
CARDIAC REHAB PHASE I   PRE:  Rate/Rhythm: 88 SR  BP:  Supine:   Sitting: 116/71  Standing:    SaO2: 98% 2L, 95%RA  MODE:  Ambulation: 150 ft   POST:  Rate/Rhythm: 115 ST  BP:  Supine:   Sitting: 124/53  Standing:    SaO2: 98 % RA hall and 93%RA room 1017-1052 Pt walked 150 ft on RA with rolling walker and asst x 1 with steady gait. She could have gone farther but she felt a little nauseated after taking potassium pill. Stopped a couple of times and sats good on RA. Appeared a little dyspneic. Left off oxygen and encouraged IS. Pt stated she wanted to try RA. She knows to call RN if she begins to feel SOB. Encouraged IS and 2 more walks today.   Luetta Nuttingharlene Siriyah Ambrosius, RN BSN  01/20/2016 10:48 AM

## 2016-01-20 NOTE — Progress Notes (Addendum)
      [address]       [place] [id number]             [phone]      3 Days Post-Op Procedure(s) (LRB): AORTIC VALVE REPLACEMENT (AVR) USING 21MM EDWARDS MAGNA EASE PERICARDIAL BIOPROSTHESIS VALVE (N/A) REPLACEMENT ASCENDING AORTA USING HEMASHIELD PLATINUM 28MM WOVEN DOUBLE VELOUR VASCULAR GRAFT (N/A) TRANSESOPHAGEAL ECHOCARDIOGRAM (TEE) (N/A)   Subjective:  [name] states she is doing okay.  She does have some shortness of breath.  She is ambulating with assistance and she plans to stay with her son and daughter in law.  Objective: Vital signs in last 24 hours: Temp:  [98.5 F (36.9 C)-99 F (37.2 C)] 98.6 F (37 C) (11/16 0401) Pulse Rate:  [82-88] 82 (11/16 0401) Cardiac Rhythm: Normal sinus rhythm (11/16 0700) Resp:  [13-26] 18 (11/16 0401) BP: (93-127)/(42-72) 101/42 (11/16 0401) SpO2:  [92 %-100 %] 92 % (11/16 0401) Weight:  [229 lb 9.6 oz (104.1 kg)] 229 lb 9.6 oz (104.1 kg) (11/16 0219)  Intake/Output from previous day: 11/15 0701 - 11/16 0700 In: -  Out: 150 [Urine:150]  General appearance: alert, cooperative and no distress Heart: regular rate and rhythm Lungs: clear to auscultation bilaterally Abdomen: soft, non-tender; bowel sounds normal; no masses,  no organomegaly Extremities: edema trace Wound: clean and dry  Lab Results:  Recent Labs  01/19/16 0430 01/20/16 0317  WBC 13.2* 12.1*  HGB 9.9* 9.3*  HCT 29.5* 27.5*  PLT 120* 131*   BMET:  Recent Labs  01/19/16 0430 01/20/16 0317  NA 134* 133*  K 4.0 4.4  CL 101 101  CO2 26 24  GLUCOSE 120* 121*  BUN 8 12  CREATININE 0.74 0.88  CALCIUM 7.2* 7.5*    PT/INR:  Recent Labs  01/17/16 1515  LABPROT 20.5*  INR 1.73   ABG    Component Value Date/Time   PHART 7.394 01/18/2016 1020   HCO3 23.5 01/18/2016 1020   TCO2 24 01/18/2016 1630   ACIDBASEDEF 1.0 01/18/2016 1020   O2SAT 96.0 01/18/2016 1020   CBG (last 3)   Recent Labs  01/19/16 2031 01/20/16 0016  01/20/16 0355  GLUCAP 128* 108* 124*    Assessment/Plan: S/P Procedure(s) (LRB): AORTIC VALVE REPLACEMENT (AVR) USING 21MM EDWARDS MAGNA EASE PERICARDIAL BIOPROSTHESIS VALVE (N/A) REPLACEMENT ASCENDING AORTA USING HEMASHIELD PLATINUM 28MM WOVEN DOUBLE VELOUR VASCULAR GRAFT (N/A) TRANSESOPHAGEAL ECHOCARDIOGRAM (TEE) (N/A)  1. CV- NSR, BP labile- continue Lopressor, will d/c EPW today 2. Pulm- wean oxygen as tolerated, CXR looks okay, continue IS 3. Renal- creatinine WNL remains hypervolemic, will continue Lasix 4. DM- not a diabetic, will stop SSIP 5. Dispo- patient stable, continue current care   LOS: 3 days    BARRETT, ERIN 01/20/2016  I have seen and examined Angela Dawson and agree with the above assessment  and plan.  Delight OvensEdward B Javeria Briski MD [facility] [phone] Office [phone] 01/20/2016 6:08 PM

## 2016-01-20 NOTE — Consult Note (Signed)
[facility] CM Primary Care Navigator  01/20/2016  Angela Dawson December 09, 1945 [id number]  Met with patient and family at the bedside to identify possible discharge needs.  Patient states increasing shortness of breath with exertion had led to this admission/ surgery.   Patient reports that  Dr. Chevis Pretty with [facility] as her primary care provider.   Patient states using [facility] and [facility]) to obtain medications without difficulty. Patient manages her own medications at home straight out of the containers since "not taking much" as stated.   Patient lives alone and independent with care She is able to drive prior to this admission/ surgery. Son (BJ) will be able to provide transportation to her doctors' appointments. Her son and daughter-in law will serve as the primary caregivers at home.   Plan for discharge is to stay at son's house for at least a week to recover according to patient.   Patient voiced understanding to call primary care provider's office once discharged, for a post discharge follow-up appointment within a week or sooner if needs arise. Patient letter provided as a reminder. She mentioned that a follow-up appointment with PCP is scheduled for 11/26.  Patient denies any further needs or concerns at this time.    For additional questions please contact:  Edwena Felty A. Itsel Opfer, BSN, RN-BC [facility] PRIMARY CARE Navigator Cell: [phone]

## 2016-01-20 NOTE — Progress Notes (Signed)
Epicardial pacing wires have been removed per MD order. Wire ends were intact upon removal. Gauze dressings applied to exit sites. NO drainage or bleeding noted. Pt tolerated well. BP is being monitored per protocol. Pt is on bedrest for 1 hour per protocol. Will continue to monitor.  Berdine DanceLauren Moffitt BSN, RN

## 2016-01-21 LAB — GLUCOSE, CAPILLARY
Glucose-Capillary: 127 mg/dL — ABNORMAL HIGH (ref 65–99)
Glucose-Capillary: 100 mg/dL — ABNORMAL HIGH (ref 65–99)
Glucose-Capillary: 136 mg/dL — ABNORMAL HIGH (ref 65–99)
Glucose-Capillary: 115 mg/dL — ABNORMAL HIGH (ref 65–99)
Glucose-Capillary: 101 mg/dL — ABNORMAL HIGH (ref 65–99)

## 2016-01-21 LAB — TYPE AND SCREEN
ABO/RH(D): A POS
Antibody Screen: NEGATIVE
Unit division: 0
Unit division: 0

## 2016-01-21 MED ORDER — FUROSEMIDE 40 MG PO TABS
40.0000 mg | ORAL_TABLET | Freq: Every day | ORAL | Status: DC
  Administered 2016-01-21 – 2016-01-22 (×2): 40 mg via ORAL
  Filled 2016-01-21 (×2): qty 1

## 2016-01-21 MED ORDER — LACTULOSE 10 GM/15ML PO SOLN: 20.0000 g | Freq: Every day | ORAL | Status: DC | PRN

## 2016-01-21 NOTE — Care Management Important Message (Signed)
Important Message  Patient Details  Name: Angela Dawson MRN: [id number] Date of Birth: 05/11/1945   Medicare Important Message Given:  Yes    [name], Brenon Antosh Abena 01/21/2016, 9:57 AM

## 2016-01-21 NOTE — Progress Notes (Signed)
CARDIAC REHAB PHASE I   PRE:  Rate/Rhythm: 87 SR  BP:  Supine:   Sitting: 120/86  Standing:    SaO2: 97%RA  MODE:  Ambulation: 350 ft   POST:  Rate/Rhythm: 119 ST  BP:  Supine:   Sitting: 142/69  Standing:    SaO2: 100%RA 0835-0915 Pt walked 350 ft on RA with rolling walker with asst x 1 and steady gait. Right hip began to hurt with walk but pt stated that is not uncommon. To recliner after walk. Pt has friend who is going to lend her a walker. Asked pt to ask if it is standard or rolling. If standard, encouraged pt to let staff know so that a rolling walker can be obtained for her. Encouraged IS, reviewed ex ed, sternal precautions, and heart healthy diet watching salt. Put on discharge video for her to view. Discussed CRP 2 and will refer to [facility].   Angela Nuttingharlene Kirke Breach, RN BSN  01/21/2016 9:12 AM

## 2016-01-21 NOTE — Progress Notes (Signed)
Patient currently up in chair eating lunch will monitor patient. Marykathryn Carboni, [name] RN

## 2016-01-21 NOTE — Discharge Instructions (Signed)
Aortic Valve Replacement, Care After °Refer to this sheet in the next few weeks. These instructions provide you with information about caring for yourself after your procedure. Your health care provider may also give you more specific instructions. Your treatment has been planned according to current medical practices, but problems sometimes occur. Call your health care provider if you have any problems or questions after your procedure. °What can I expect after the procedure? °After the procedure, it is common to have: °· Pain around your incision area. °· A small amount of blood or clear fluid coming from your incision. ° °Follow these instructions at home: °Eating and drinking ° °· Follow instructions from your health care provider about eating or drinking restrictions. °? Limit alcohol intake to no more than 1 drink per day for nonpregnant women and 2 drinks per day for men. One drink equals 12 oz of beer, 5 oz of wine, or 1½ oz of hard liquor. °? Limit how much caffeine you drink. Caffeine can affect your heart's rate and rhythm. °· Drink enough fluid to keep your urine clear or pale yellow. °· Eat a heart-healthy diet. This should include plenty of fresh fruits and vegetables. If you eat meat, it should be lean cuts. Avoid foods that are: °? High in salt, saturated fat, or sugar. °? Canned or highly processed. °? Fried. °Activity °· Return to your normal activities as told by your health care provider. Ask your health care provider what activities are safe for you. °· Exercise regularly once you have recovered, as told by your health care provider. °· Avoid sitting for more than 2 hours at a time without moving. Get up and move around at least once every 1-2 hours. This helps to prevent blood clots in the legs. °· Do not lift anything that is heavier than 10 lb (4.5 kg) until your health care provider approves. °· Avoid pushing or pulling things with your arms until your health care provider approves. This  includes pulling on handrails to help you climb stairs. °Incision care ° °· Follow instructions from your health care provider about how to take care of your incision. Make sure you: °? Wash your hands with soap and water before you change your bandage (dressing). If soap and water are not available, use hand sanitizer. °? Change your dressing as told by your health care provider. °? Leave stitches (sutures), skin glue, or adhesive strips in place. These skin closures may need to stay in place for 2 weeks or longer. If adhesive strip edges start to loosen and curl up, you may trim the loose edges. Do not remove adhesive strips completely unless your health care provider tells you to do that. °· Check your incision area every day for signs of infection. Check for: °? More redness, swelling, or pain. °? More fluid or blood. °? Warmth. °? Pus or a bad smell. °Medicines °· Take over-the-counter and prescription medicines only as told by your health care provider. °· If you were prescribed an antibiotic medicine, take it as told by your health care provider. Do not stop taking the antibiotic even if you start to feel better. °Travel °· Avoid airplane travel for as long as told by your health care provider. °· When you travel, bring a list of your medicines and a record of your medical history with you. Carry your medicines with you. °Driving °· Ask your health care provider when it is safe for you to drive. Do not drive until your health   care provider approves. °· Do not drive or operate heavy machinery while taking prescription pain medicine. °Lifestyle ° °· Do not use any tobacco products, such as cigarettes, chewing tobacco, or e-cigarettes. If you need help quitting, ask your health care provider. °· Resume sexual activity as told by your health care provider. Do not use medicines for erectile dysfunction unless your health care provider approves, if this applies. °· Work with your health care provider to keep your  blood pressure and cholesterol under control, and to manage any other heart conditions that you have. °· Maintain a healthy weight. °General instructions °· Do not take baths, swim, or use a hot tub until your health care provider approves. °· Do not strain to have a bowel movement. °· Avoid crossing your legs while sitting down. °· Check your temperature every day for a fever. A fever may be a sign of infection. °· If you are a woman and you plan to become pregnant, talk with your health care provider before you become pregnant. °· Wear compression stockings if your health care provider instructs you to do this. These stockings help to prevent blood clots and reduce swelling in your legs. °· Tell all health care providers who care for you that you have an artificial (prosthetic) aortic valve. If you have or have had heart disease or endocarditis, tell all health care providers about these conditions as well. °· Keep all follow-up visits as told by your health care provider. This is important. °Contact a health care provider if: °· You develop a skin rash. °· You experience sudden, unexplained changes in your weight. °· You have more redness, swelling, or pain around your incision. °· You have more fluid or blood coming from your incision. °· Your incision feels warm to the touch. °· You have pus or a bad smell coming from your incision. °· You have a fever. °Get help right away if: °· You develop chest pain that is different from the pain coming from your incision. °· You develop shortness of breath or difficulty breathing. °· You start to feel light-headed. °These symptoms may represent a serious problem that is an emergency. Do not wait to see if the symptoms will go away. Get medical help right away. Call your local emergency services (911 in the [place] Do not drive yourself to the hospital. °This information is not intended to replace advice given to you by your health care provider. Make sure you discuss any  questions you have with your health care provider. °Document Released: 09/08/2004 Document Revised: 07/29/2015 Document Reviewed: 01/24/2015 °Elsevier Interactive Patient Education © 2017 [employer] ° °

## 2016-01-21 NOTE — Progress Notes (Signed)
      [address]       [place] [id number]             [phone]      4 Days Post-Op Procedure(s) (LRB): AORTIC VALVE REPLACEMENT (AVR) USING 21MM EDWARDS MAGNA EASE PERICARDIAL BIOPROSTHESIS VALVE (N/A) REPLACEMENT ASCENDING AORTA USING HEMASHIELD PLATINUM 28MM WOVEN DOUBLE VELOUR VASCULAR GRAFT (N/A) TRANSESOPHAGEAL ECHOCARDIOGRAM (TEE) (N/A)   Subjective:  Angela Dawson has no new complaints.  She walked some yesterday, but states she didn't get very far.  She has not yet moved his bowels  Objective: Vital signs in last 24 hours: Temp:  [97.8 F (36.6 C)-98.6 F (37 C)] 97.8 F (36.6 C) (11/17 0413) Pulse Rate:  [78-91] 82 (11/17 0413) Cardiac Rhythm: Normal sinus rhythm (11/16 1942) Resp:  [18] 18 (11/17 0413) BP: (104-127)/(58-86) 124/58 (11/17 0413) SpO2:  [95 %-98 %] 97 % (11/17 0413) Weight:  [225 lb 11.2 oz (102.4 kg)] 225 lb 11.2 oz (102.4 kg) (11/17 0413)  Intake/Output from previous day: 11/16 0701 - 11/17 0700 In: 480 [P.O.:480] Out: 1450 [Urine:1450]  General appearance: alert, cooperative and no distress Heart: regular rate and rhythm Lungs: clear to auscultation bilaterally Abdomen: soft, non-tender; bowel sounds normal; no masses,  no organomegaly Extremities: edema trace, improved Wound: clean and dry, ecchymosis superior and inferior portion of sternotomy  Lab Results:  Recent Labs  01/19/16 0430 01/20/16 0317  WBC 13.2* 12.1*  HGB 9.9* 9.3*  HCT 29.5* 27.5*  PLT 120* 131*   BMET:  Recent Labs  01/19/16 0430 01/20/16 0317  NA 134* 133*  K 4.0 4.4  CL 101 101  CO2 26 24  GLUCOSE 120* 121*  BUN 8 12  CREATININE 0.74 0.88  CALCIUM 7.2* 7.5*    PT/INR: No results for input(s): LABPROT, INR in the last 72 hours. ABG    Component Value Date/Time   PHART 7.394 01/18/2016 1020   HCO3 23.5 01/18/2016 1020   TCO2 24 01/18/2016 1630   ACIDBASEDEF 1.0 01/18/2016 1020   O2SAT 96.0 01/18/2016 1020   CBG (last 3)    Recent Labs  01/20/16 2008 01/20/16 2351 01/21/16 0412  GLUCAP 105* 109* 127*    Assessment/Plan: S/P Procedure(s) (LRB): AORTIC VALVE REPLACEMENT (AVR) USING 21MM EDWARDS MAGNA EASE PERICARDIAL BIOPROSTHESIS VALVE (N/A) REPLACEMENT ASCENDING AORTA USING HEMASHIELD PLATINUM 28MM WOVEN DOUBLE VELOUR VASCULAR GRAFT (N/A) TRANSESOPHAGEAL ECHOCARDIOGRAM (TEE) (N/A)  1. CV- NSR, with PVCs- continue Lopressor, but will increase dose to 25 mg BID 2. Pulm- off oxygen, no acute issues, continue IS 3. Renal- creatinine WNL, weigth came down 4lbs yesterday with lasix, continue oral regimen 4. GI- LOC constipation, will add lactulose prn 5. Dispo- patient stable, will plan to d/c home in Am if no issues arise   LOS: 4 days    Raford PitcherBARRETT, Denny PeonRIN 01/21/2016

## 2016-01-21 NOTE — Progress Notes (Signed)
Patient and nursing staff ambulated in hallway with walker 350 feet. patient with 3 standing rest breaks and short winded when back in room upon checking sats they were 98-99 % on room air. Back in chair will monitor patient Angela Dawson, [name] RN

## 2016-01-21 NOTE — Progress Notes (Signed)
Rowe ClackWayne E Gold, PA-C  Dewain PenningPatricia H Trent        Please arrange to week cardiology appointment. She is status post aVR on 01/17/2016 by ABG. Cardiologist is Dr. Wyline MoodBranch.   Thank you      Covering Trish's inbasket today as she is out. [facility] - there is no availability with Dr. Wyline MoodBranch in the timeframe requested. Spoke with the patient on the phone. [place] go [place]. Next available APP appt is 12/6 with Jacolyn ReedyMichele Lenze. Appt info added to Epic DC section. Dayna Dunn PA-C

## 2016-01-21 NOTE — Discharge Summary (Signed)
Physician Discharge Summary  Patient ID: Angela Dawson MRN: [id number] DOB/AGE: March 27, 1945 70 y.o.  Admit date: 01/17/2016 Discharge date: 01/22/2016  Admission Diagnoses:Severe aortic stenosis  Discharge Diagnoses:  Active Problems:   S/P AVR (aortic valve replacement)  Patient Active Problem List   Diagnosis Date Noted  . S/P AVR (aortic valve replacement) 01/17/2016  . Aortic stenosis   . Ascending aorta dilatation (HCC) 11/20/2013  . Aortic stenosis due to bicuspid aortic valve 11/20/2013  . Hyperlipidemia 09/30/2013  . Severe obesity (BMI >= 40) (HCC) 09/30/2013  . Hypertension 07/17/2012   History of Present Illness:    Angela Dawson 70 y.o. female followed in the office for AS and dilated ascending aorta with  bicuspid aortic valve. The patient was  asymptomatic from a cardiac standpoint.  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  She notes symptoms walking across parking lot to come to appointment .  She denies  PND, pedal edema, syncope, presyncope..   Other than hypertension the patient has no previous history of cardiac disease  She does have a family history of cardiac disease her father died of myocardial infarction age 57   No family history of aortic dissection  She has seen her dentist and dental work all done.   She was admitted for elective aortic valve replacement.  Discharged Condition: good  Hospital Course: The patient was admitted electively and taken to the operating room on 01/17/2016 at which time she underwent the below described procedure. She tolerated well was taken to the surgical intensive care unit in stable condition  Post operative Hospital course:  Overall the patient has done well. She has maintained stable hemodynamics. She was weaned from the ventilator without difficulty using standard protocols. She has had some PVCs but  otherwise no significant cardiac dysrhythmias. She has been weaned from oxygen. She does have some postoperative volume overload but is responding well to diuretics. Creatinine is within normal limits. She does have an expected acute blood loss anemia. She has a mild postoperative thrombocytopenia.  She was restarted on a reduced dose of her home ACE inhibitor due to mild hypertension. Incisions are healing well without evidence of infection. She is tolerating increasing activities using standard cardiac rehabilitation protocols. At time of discharge she is felt to be quite stable.  Consults: None  Significant Diagnostic Studies: Routine postoperative serial chest x-ray and laboratory.  Treatments: surgery:  DATE OF PROCEDURE:  01/17/2016 DATE OF DISCHARGE:                              OPERATIVE REPORT   PREOPERATIVE DIAGNOSES:  Severe aortic stenosis, symptomatic and dilated ascending aorta.  POSTOPERATIVE DIAGNOSES:  Severe aortic stenosis, symptomatic and dilated ascending aorta.  SURGICAL PROCEDURE:  Aortic valve replacement with a pericardial tissue valve, [employer], model 3300TFX 21 mm, serial #[id number], and supracoronary replacement of ascending aorta.  SURGEON:  Sheliah Plane, [name]  FIRST ASSISTANT:  Lowella Dandy, PA.   Disposition: 01-Home or Self Care  Discharge Instructions    Amb Referral to Cardiac Rehabilitation    Complete by:  As directed    Diagnosis:  Valve Replacement   Valve:  Aortic      Discharge medications:  The patient has been discharged on:   1.Beta Blocker:  Yes [ x  ]  No   [   ]                              If No, reason:  2.Ace Inhibitor/ARB: Yes [ x  ]                                     No  [    ]                                     If No, reason:  3.Statin:   Yes [   ]                  No  [ x  ]                  If No, reason: Allergy  4.Marlowe KaysEcasa:  Yes  [ x  ]                  No    [   ]                  If No, reason:        Medication List    STOP taking these medications   aspirin 81 MG tablet Replaced by:  aspirin 325 MG EC tablet   atenolol 25 MG tablet Commonly known as:  TENORMIN   hydrochlorothiazide 25 MG tablet Commonly known as:  HYDRODIURIL   ibuprofen 200 MG tablet Commonly known as:  ADVIL,MOTRIN     TAKE these medications   acetaminophen 325 MG tablet Commonly known as:  TYLENOL Take 2 tablets (650 mg total) by mouth every 6 (six) hours as needed for mild pain.   aspirin 325 MG EC tablet Take 1 tablet (325 mg total) by mouth daily. Replaces:  aspirin 81 MG tablet   cetirizine 10 MG tablet Commonly known as:  ZYRTEC Take 10 mg by mouth daily.   furosemide 40 MG tablet Commonly known as:  LASIX Take 1 tablet (40 mg total) by mouth daily. For 7 Days   lisinopril 5 MG tablet Commonly known as:  PRINIVIL,ZESTRIL Take 1 tablet (5 mg total) by mouth daily. What changed:  See the new instructions.   metoprolol tartrate 25 MG tablet Commonly known as:  LOPRESSOR Take 0.5 tablets (12.5 mg total) by mouth 2 (two) times daily.   ondansetron 4 MG tablet Commonly known as:  ZOFRAN Take 1 tablet (4 mg total) by mouth every 6 (six) hours as needed for nausea.   Potassium Chloride ER 20 MEQ Tbcr Take 20 mEq by mouth daily. For 7 Days   traMADol 50 MG tablet Commonly known as:  ULTRAM Take 1-2 tablets (50-100 mg total) by mouth every 4 (four) hours as needed for moderate pain.      Follow-up Information    Delight OvensEdward B Gerhardt, MD Follow up.   Specialty:  Cardiothoracic Surgery Why:  Appointment to see Dr. Tyrone SageGerhardt on 02/17/2016 at 1:30 PM. Please [facility] at 1 PM. [facility] is located in the same office complex. Contact information: [address] [place] [address] [phone]        Jacolyn ReedyMichele Lenze, PA-C Follow up.   Specialty:  Cardiology Why:  02/09/16 at 1pm at  [facility] [place] - Elon JesterMichele is one of the PAs with Dr. Wyline MoodBranch. This appointment will be in the [place] office, which is located on the main floor of the [facility]. Contact information: [address] [place] [address] [phone]           Signed: Lowella DandyBARRETT, Arlisa Leclere 01/22/2016, 7:41 AM

## 2016-01-22 LAB — GLUCOSE, CAPILLARY
Glucose-Capillary: 103 mg/dL — ABNORMAL HIGH (ref 65–99)
Glucose-Capillary: 109 mg/dL — ABNORMAL HIGH (ref 65–99)
Glucose-Capillary: 100 mg/dL — ABNORMAL HIGH (ref 65–99)

## 2016-01-22 MED ORDER — POTASSIUM CHLORIDE ER 20 MEQ PO TBCR: 20.0000 meq | EXTENDED_RELEASE_TABLET | Freq: Every day | ORAL | 0 refills | Status: DC

## 2016-01-22 MED ORDER — ACETAMINOPHEN 325 MG PO TABS: 650.0000 mg | ORAL_TABLET | Freq: Four times a day (QID) | ORAL | Status: AC | PRN

## 2016-01-22 MED ORDER — LISINOPRIL 5 MG PO TABS: 5.0000 mg | ORAL_TABLET | Freq: Every day | ORAL | 3 refills | Status: DC

## 2016-01-22 MED ORDER — METOPROLOL TARTRATE 25 MG PO TABS: 12.5000 mg | ORAL_TABLET | Freq: Two times a day (BID) | ORAL | 3 refills | Status: DC

## 2016-01-22 MED ORDER — ASPIRIN 325 MG PO TBEC: 325.0000 mg | DELAYED_RELEASE_TABLET | Freq: Every day | ORAL | 0 refills | Status: DC

## 2016-01-22 MED ORDER — ONDANSETRON HCL 4 MG PO TABS: 4.0000 mg | ORAL_TABLET | Freq: Four times a day (QID) | ORAL | 0 refills | Status: DC | PRN

## 2016-01-22 MED ORDER — FUROSEMIDE 40 MG PO TABS: 40.0000 mg | ORAL_TABLET | Freq: Every day | ORAL | 0 refills | Status: DC

## 2016-01-22 MED ORDER — TRAMADOL HCL 50 MG PO TABS: 50.0000 mg | ORAL_TABLET | ORAL | 0 refills | Status: DC | PRN

## 2016-01-22 NOTE — Progress Notes (Signed)
CARDIAC REHAB PHASE I   PRE:  Rate/Rhythm: 85  BP:  Sitting: 131/62     SaO2: 95  MODE:  Ambulation: 350 ft   POST:  Rate/Rhythm: 123  BP:  Sitting: 155/69     SaO2: 93  11:15am-11:35am Patient ambulated independent. No walker. Two rest breaks due to shortness of breath. States she feels tired but proud of herself for walking without a walker. Ready to go home. Placed back in chair.   Barnabas ListerMolly M Avarae Zwart, MS 01/22/2016 11:31 AM

## 2016-01-22 NOTE — Progress Notes (Addendum)
Patient in a stable condition, this RN went over discharge eduction with patient they verbalised understanding , paper prescriptions given to patient , patient belongings at bedside, tele dc ccmd notified, iv removed,patient taken off the unit on a wheelchair by this RN

## 2016-01-22 NOTE — Progress Notes (Addendum)
      [address]       [place], [id number]             [phone]       5 Days Post-Op Procedure(s) (LRB): AORTIC VALVE REPLACEMENT (AVR) USING 21MM EDWARDS MAGNA EASE PERICARDIAL BIOPROSTHESIS VALVE (N/A) REPLACEMENT ASCENDING AORTA USING HEMASHIELD PLATINUM 28MM WOVEN DOUBLE VELOUR VASCULAR GRAFT (N/A) TRANSESOPHAGEAL ECHOCARDIOGRAM (TEE) (N/A)   Subjective:  Patient states she had a rough night and was unable to sleep.  She states she wants to go home as she is just unable to get a good rest here and she feels she would be able to rest better.  + ambulation  +BM  Objective: Vital signs in last 24 hours: Temp:  [98.1 F (36.7 C)-98.9 F (37.2 C)] 98.1 F (36.7 C) (11/18 0456) Pulse Rate:  [83-90] 84 (11/18 0456) Cardiac Rhythm: Normal sinus rhythm (11/17 1900) Resp:  [18] 18 (11/18 0456) BP: (120-143)/(54-69) 139/54 (11/18 0456) SpO2:  [96 %-100 %] 96 % (11/18 0456) Weight:  [223 lb 12.3 oz (101.5 kg)] 223 lb 12.3 oz (101.5 kg) (11/18 0457)  Intake/Output from previous day: 11/17 0701 - 11/18 0700 In: 702 [P.O.:702] Out: 350 [Urine:350]  General appearance: alert, cooperative and no distress Heart: regular rate and rhythm Lungs: clear to auscultation bilaterally Abdomen: soft, non-tender; bowel sounds normal; no masses,  no organomegaly Extremities: edema trace Wound: clean and dry  Lab Results:  Recent Labs  01/20/16 0317  WBC 12.1*  HGB 9.3*  HCT 27.5*  PLT 131*   BMET:  Recent Labs  01/20/16 0317  NA 133*  K 4.4  CL 101  CO2 24  GLUCOSE 121*  BUN 12  CREATININE 0.88  CALCIUM 7.5*    PT/INR: No results for input(s): LABPROT, INR in the last 72 hours. ABG    Component Value Date/Time   PHART 7.394 01/18/2016 1020   HCO3 23.5 01/18/2016 1020   TCO2 24 01/18/2016 1630   ACIDBASEDEF 1.0 01/18/2016 1020   O2SAT 96.0 01/18/2016 1020   CBG (last 3)   Recent Labs  01/21/16 1616 01/21/16 2018 01/22/16 0539  GLUCAP 115*  101* 103*    Assessment/Plan: S/P Procedure(s) (LRB): AORTIC VALVE REPLACEMENT (AVR) USING 21MM EDWARDS MAGNA EASE PERICARDIAL BIOPROSTHESIS VALVE (N/A) REPLACEMENT ASCENDING AORTA USING HEMASHIELD PLATINUM 28MM WOVEN DOUBLE VELOUR VASCULAR GRAFT (N/A) TRANSESOPHAGEAL ECHOCARDIOGRAM (TEE) (N/A)  1. CV- NSR, BP a little elevated- will continue Lopressor, add low dose ACE for BP control 2. Pulm- no acute issues, continue IS 3. Renal- creatinine WNL, weight is trending down... Will taper lasix at discharge 4. GI-constipation resolved 5. DM- sugars controlled 6. Dispo- patient stable, maintaining NSR, ambulating with minimal assistance... Will d/c home today   LOS: 5 days    BARRETT, ERIN 01/22/2016   I have seen and examined the patient and agree with the assessment and plan as outlined.  Purcell Nailslarence H Owen, MD 01/22/2016 10:48 AM

## 2016-01-25 ENCOUNTER — Other Ambulatory Visit: Payer: Self-pay | Admitting: *Deleted

## 2016-01-25 DIAGNOSIS — R600 Localized edema: Secondary | ICD-10-CM

## 2016-01-25 MED ORDER — POTASSIUM CHLORIDE ER 20 MEQ PO TBCR: 20.0000 meq | EXTENDED_RELEASE_TABLET | Freq: Every day | ORAL | 1 refills | Status: DC

## 2016-01-25 MED ORDER — FUROSEMIDE 40 MG PO TABS: 40.0000 mg | ORAL_TABLET | Freq: Every day | ORAL | 1 refills | Status: DC

## 2016-01-25 MED ORDER — POTASSIUM CHLORIDE CRYS ER 10 MEQ PO TBCR: 20.0000 meq | EXTENDED_RELEASE_TABLET | Freq: Every day | ORAL | 1 refills | Status: DC

## 2016-02-09 ENCOUNTER — Encounter: Payer: Self-pay | Admitting: Physician Assistant

## 2016-02-09 ENCOUNTER — Ambulatory Visit (INDEPENDENT_AMBULATORY_CARE_PROVIDER_SITE_OTHER): Payer: Medicare Other | Admitting: Physician Assistant

## 2016-02-09 VITALS — BP 130/70 | HR 71 | Ht 60.0 in | Wt 209.0 lb

## 2016-02-09 DIAGNOSIS — Z952 Presence of prosthetic heart valve: Secondary | ICD-10-CM

## 2016-02-09 DIAGNOSIS — I10 Essential (primary) hypertension: Secondary | ICD-10-CM

## 2016-02-09 NOTE — Progress Notes (Signed)
Cardiology Office Note    Date:  02/09/2016   ID:  Angela, Dawson Oct 26, 1945, MRN [id number]  PCP:  Bennie Pierini, FNP  Cardiologist: Dr. Wyline Mood   Chief Complaint  Patient presents with  . Follow-up    History of Present Illness:  Angela Dawson is a 70 y.o. female With history of severe aortic stenosis and dilated ascending aorta with a bicuspid aortic valve underwent aVR with pericardial tissue valve replacement of ascending aorta 01/17/16 by Dr. Tyrone Sage.  2-D echo 09/2015 LVEF 60-65%. Cardiac cath 09/2015 nonobstructive CAD with normal LV systolic function.  Patient comes in today for post hospital follow-up. She had a lot of swelling after surgery and was given extra Lasix. Her weight is down to her baseline now. She is overall feeling better. She is having some pain in her back but it is gradually improving. She hasn't been contacted by cardiac rehabilitation yet.    Past Medical History:  Diagnosis Date  . Allergy    seasonal  . Chicken pox   . Complication of anesthesia    difficult to wake up after tubal ligations  . Dyspnea    with exertion  . Heart murmur   . History of bronchitis   . History of kidney stones    "only one"  . Hypertension   . Measles     Past Surgical History:  Procedure Laterality Date  . AORTIC VALVE REPLACEMENT N/A 01/17/2016   Procedure: AORTIC VALVE REPLACEMENT (AVR) USING EDWARDS MAGNA EASE PERICARDIAL BIOPROSTHESIS VALVE;  Surgeon: Delight Ovens, MD;  Location: [facility] OR;  Service: Open Heart Surgery;  Laterality: N/A;  . CARDIAC CATHETERIZATION N/A 09/24/2015   Procedure: Right/Left Heart Cath and Coronary Angiography;  Surgeon: Corky Crafts, MD;  [facility] INVASIVE CV LAB;  Service: Cardiovascular;  Laterality: N/A;  . COLONOSCOPY    . REPLACEMENT ASCENDING AORTA N/A 01/17/2016   Procedure: REPLACEMENT ASCENDING AORTA USING HEMASHIELD PLATINUM WOVEN DOUBLE VELOUR VASCULAR GRAFT;  Surgeon: Delight Ovens, MD;  Location: [facility] OR;  Service: Open Heart Surgery;  Laterality: N/A;  . TEE WITHOUT CARDIOVERSION N/A 01/17/2016   Procedure: TRANSESOPHAGEAL ECHOCARDIOGRAM (TEE);  Surgeon: Delight Ovens, MD;  Location: [facility];  Service: Open Heart Surgery;  Laterality: N/A;  . TUBAL LIGATION      Current Medications: Outpatient Medications Prior to Visit  Medication Sig Dispense Refill  . acetaminophen (TYLENOL) 325 MG tablet Take 2 tablets (650 mg total) by mouth every 6 (six) hours as needed for mild pain.    Marland Kitchen aspirin EC 325 MG EC tablet Take 1 tablet (325 mg total) by mouth daily. 30 tablet 0  . cetirizine (ZYRTEC) 10 MG tablet Take 10 mg by mouth daily.    . furosemide (LASIX) 40 MG tablet Take 1 tablet (40 mg total) by mouth daily. For 7 Days 14 tablet 1  . lisinopril (PRINIVIL,ZESTRIL) 5 MG tablet Take 1 tablet (5 mg total) by mouth daily. 30 tablet 3  . metoprolol tartrate (LOPRESSOR) 25 MG tablet Take 0.5 tablets (12.5 mg total) by mouth 2 (two) times daily. 60 tablet 3  . ondansetron (ZOFRAN) 4 MG tablet Take 1 tablet (4 mg total) by mouth every 6 (six) hours as needed for nausea. 20 tablet 0  . potassium chloride (K-DUR,KLOR-CON) 10 MEQ tablet Take 2 tablets (20 mEq total) by mouth daily. 28 tablet 1  . traMADol (ULTRAM) 50 MG tablet Take 1-2 tablets (50-100 mg total) by mouth  every 4 (four) hours as needed for moderate pain. 30 tablet 0   No facility-administered medications prior to visit.      Allergies:   Crestor [rosuvastatin]; Lipitor [atorvastatin]; Statins; and Penicillins   Social History   Social History  . Marital status: Widowed    Spouse name: N/A  . Number of children: N/A  . Years of education: N/A   Social History Main Topics  . Smoking status: Former Smoker    Packs/day: 0.50    Years: 5.00    Types: Cigarettes    Start date: 08/04/1968    Quit date: 10/10/1973  . Smokeless tobacco: Never Used     Comment: smoked less than 1 pack every 2 weeks. Havent  smoked in atleast 40 years  . Alcohol use No  . Drug use: No  . Sexual activity: No   Other Topics Concern  . None   Social History Narrative  . None     Family History:  The patient's   family history includes Alzheimer's disease in her mother; Heart attack (age of onset: 7964) in her father; Heart disease in her mother.   ROS:   Please see the history of present illness.    Review of Systems  Constitution: Negative.  HENT: Negative.   Eyes: Negative.   Cardiovascular: Negative.   Respiratory: Negative.   Hematologic/Lymphatic: Negative.   Musculoskeletal: Positive for back pain. Negative for joint pain.  Gastrointestinal: Negative.   Genitourinary: Negative.   Neurological: Negative.    All other systems reviewed and are negative.   PHYSICAL EXAM:   VS:  BP 130/70   Pulse 71   Ht 5' (1.524 m)   Wt 209 lb (94.8 kg)   SpO2 96%   BMI 40.82 kg/m   Physical Exam  GEN: Well nourished, well developed, in no acute distress Neck: no JVD, carotid bruits, or masses Cardiac: Incisions healing well RRR; no murmurs, rubs, or gallops  Respiratory:  clear to auscultation bilaterally, normal work of breathing GI: soft, nontender, nondistended, + BS Ext: without cyanosis, clubbing, or edema, Good distal pulses bilaterally MS: no deformity or atrophy Skin: warm and dry, no rash Neuro:  Alert and Oriented x 3, Strength and sensation are intact Psych: euthymic mood, full affect  Wt Readings from Last 3 Encounters:  02/09/16 209 lb (94.8 kg)  01/22/16 223 lb 12.3 oz (101.5 kg)  01/14/16 218 lb 1.6 oz (98.9 kg)      Studies/Labs Reviewed:   EKG:  EKG is not ordered today.     Recent Labs: 01/14/2016: ALT 22 01/18/2016: Magnesium 2.0 01/20/2016: BUN 12; Creatinine, Ser 0.88; Hemoglobin 9.3; Platelets 131; Potassium 4.4; Sodium 133   Lipid Panel    Component Value Date/Time   CHOL 211 (H) 11/30/2015 1054   CHOL 202 (H) 07/17/2012 1652   TRIG 114 11/30/2015 1054   TRIG  119 07/29/2014 1139   TRIG 129 07/17/2012 1652   HDL 49 11/30/2015 1054   HDL 52 07/29/2014 1139   HDL 45 07/17/2012 1652   CHOLHDL 4.3 11/30/2015 1054   LDLCALC 139 (H) 11/30/2015 1054   LDLCALC 156 (H) 09/30/2013 0909   LDLCALC 131 (H) 07/17/2012 1652    Additional studies/ records that were reviewed today include:  09/2015 echo Study Conclusions   - Left ventricle: The cavity size was normal. Wall thickness was   increased in a pattern of moderate LVH. Systolic function was   normal. The estimated ejection fraction was in the range  of 60%   to 65%. Wall motion was normal; there were no regional wall   motion abnormalities. Features are consistent with a pseudonormal   left ventricular filling pattern, with concomitant abnormal   relaxation and increased filling pressure (grade 2 diastolic   dysfunction). Doppler parameters are consistent with high   ventricular filling pressure. - Aortic valve: Moderately thickened, severely calcified leaflets.   There was moderate to severe stenosis. There was mild   regurgitation. Peak velocity (S): 390 cm/s. Mean gradient (S): 34   mm Hg. Valve area (VTI): 1.01 cm^2. Valve area (Vmax): 0.89 cm^2.   Valve area (Vmean): 0.93 cm^2. - Aorta: Ascending aortic diameter: 46 mm (S). - Ascending aorta: The ascending aorta was mild to moderately   dilated. - Mitral valve: Calcified annulus. There was mild regurgitation. - Left atrium: The atrium was moderately dilated. - Right ventricle: Systolic function was mildly to moderately   reduced. - Tricuspid valve: There was mild-moderate regurgitation.   09/2015 Cath  The left ventricular systolic function is normal.  Nonobstructive CAD.  Normal PA pressures. CI 2.3; PA sat 68%.  Dilated ascending aorta.   Continue with plans for AVR and aneurysm repair         ASSESSMENT:    1. S/P AVR (aortic valve replacement)   2. Essential hypertension   3. Severe obesity (BMI >= 40) (HCC)       PLAN:  In order of problems listed above:  Status post aVR with pericardial tissue valve and replacement of ascending aorta 01/17/16 by Dr. Tyrone SageGerhardt. Patient doing well now postop. Most of her fluid has resolved. She is walking some but would like to start cardiac rehabilitation. Will refer back. Follow-up with Dr. branch in 2 months.  Hypertension controlled on current medications  Obesity weight loss recommended hopefully she can get on exercise program once she starts cardiac rehabilitation   Medication Adjustments/Labs and Tests Ordered: Current medicines are reviewed at length with the patient today.  Concerns regarding medicines are outlined above.  Medication changes, Labs and Tests ordered today are listed in the Patient Instructions below. Patient Instructions  Your physician recommends that you schedule a follow-up appointment in: 2 Months with Dr. Wyline MoodBranch in [place]   Your physician recommends that you continue on your current medications as directed. Please refer to the Current Medication list given to you today.  You have been referred to Cardiac Rehab   If you need a refill on your cardiac medications before your next appointment, please call your pharmacy.  Thank you for choosing [facility]!        Elson ClanSigned, Kemper Hochman, PA-C  02/09/2016 4:03 PM    [facility] [address], [place], [place]  [address] Phone: [phone]; Fax: [phone]

## 2016-02-09 NOTE — Patient Instructions (Signed)
Your physician recommends that you schedule a follow-up appointment in: 2 Months with Dr. [place]yline [place]Branch in [place]   Your physician recommends that you continue on your current medications as directed. Please refer to the Current Medication list given to you today.  You have been referred to Cardiac Rehab   If you need a refill on your cardiac medications before your next appointment, please call your pharmacy.  Thank you for choosing [facility]!

## 2016-02-16 ENCOUNTER — Other Ambulatory Visit: Payer: Self-pay | Admitting: Cardiothoracic Surgery

## 2016-02-16 DIAGNOSIS — Z952 Presence of prosthetic heart valve: Secondary | ICD-10-CM

## 2016-02-17 ENCOUNTER — Encounter: Payer: Self-pay | Admitting: Cardiothoracic Surgery

## 2016-02-17 ENCOUNTER — Ambulatory Visit
Admission: RE | Admit: 2016-02-17 | Discharge: 2016-02-17 | Disposition: A | Discharge status: Home / Self Care | Payer: Medicare Other | Source: Ambulatory Visit | Attending: Cardiothoracic Surgery | Admitting: Cardiothoracic Surgery

## 2016-02-17 ENCOUNTER — Other Ambulatory Visit: Payer: Self-pay | Admitting: Physician Assistant

## 2016-02-17 ENCOUNTER — Ambulatory Visit (INDEPENDENT_AMBULATORY_CARE_PROVIDER_SITE_OTHER): Payer: Self-pay | Admitting: Cardiothoracic Surgery

## 2016-02-17 VITALS — BP 150/79 | HR 96 | Resp 20 | Ht 60.0 in | Wt 209.0 lb

## 2016-02-17 DIAGNOSIS — R918 Other nonspecific abnormal finding of lung field: Secondary | ICD-10-CM | POA: Diagnosis not present

## 2016-02-17 DIAGNOSIS — Z952 Presence of prosthetic heart valve: Secondary | ICD-10-CM

## 2016-02-17 NOTE — Patient Instructions (Signed)
Endocarditis is a potentially serious infection of heart valves or inside lining of the heart.  It occurs more commonly in patients with diseased heart valves (such as patient's with aortic or mitral valve disease) and in patients who have undergone heart valve repair or replacement.  Certain surgical and dental procedures may put you at risk, such as dental cleaning, other dental procedures, or any surgery involving the respiratory, urinary, gastrointestinal tract, gallbladder or prostate gland.   To minimize your chances for develooping endocarditis, maintain good oral health and seek prompt medical attention for any infections involving the mouth, teeth, gums, skin or urinary tract.    Always notify your doctor or dentist about your underlying heart valve condition before having any invasive procedures. You will need to take antibiotics before certain procedures, including all routine dental cleanings or other dental procedures.  Your cardiologist or dentist should prescribe these antibiotics for you to be taken ahead of time.     You are encouraged to enroll and participate in the outpatient cardiac rehab program beginning as soon as practical.  Continue to avoid any heavy lifting or strenuous use of your arms or shoulders for at least a total of three months from the time of surgery.  After three months you may gradually increase how much you lift or otherwise use your arms or chest as tolerated, with limits based upon whether or not activities lead to the return of significant discomfort.You may return to driving an automobile as long as you are no longer requiring oral narcotic pain relievers during the daytime.  It would be wise to start driving only short distances during the daylight and gradually increase from there as you feel comfortable.

## 2016-02-17 NOTE — Progress Notes (Signed)
[address]       [place] [id number]             [phone]        SURGICAL PROCEDURE:  Aortic valve replacement with a pericardial tissue valve, [employer], model 3300TFX 21 mm, serial #[id number], and supracoronary replacement of ascending aorta.  HPI:  Patient returns for routine postoperative follow-up having undergone an aortic valve replacement (tissue valve) and supra coronary replacement of the ascending aorta on 01/17/2016 by Dr. Tyrone SageGerhardt. The patient's early postoperative recovery while in the hospital was notable for mild thrombocytopenia (last platelet count up to 131,000). Since hospital discharge the patient reports less back pain (had post op).   Current Outpatient Prescriptions  Medication Sig Dispense Refill  . acetaminophen (TYLENOL) 325 MG tablet Take 2 tablets (650 mg total) by mouth every 6 (six) hours as needed for mild pain.    Marland Kitchen. aspirin EC 325 MG EC tablet Take 1 tablet (325 mg total) by mouth daily. 30 tablet 0  . cetirizine (ZYRTEC) 10 MG tablet Take 10 mg by mouth daily.    . furosemide (LASIX) 40 MG tablet Take 1 tablet (40 mg total) by mouth daily. For 7 Days 14 tablet 1  . lisinopril (PRINIVIL,ZESTRIL) 5 MG tablet Take 1 tablet (5 mg total) by mouth daily. 30 tablet 3  . metoprolol tartrate (LOPRESSOR) 25 MG tablet Take 0.5 tablets (12.5 mg total) by mouth 2 (two) times daily. 60 tablet 3  . ondansetron (ZOFRAN) 4 MG tablet Take 1 tablet (4 mg total) by mouth every 6 (six) hours as needed for nausea. 20 tablet 0  . potassium chloride (K-DUR,KLOR-CON) 10 MEQ tablet Take 2 tablets (20 mEq total) by mouth daily. 28 tablet 1  . traMADol (ULTRAM) 50 MG tablet Take 1-2 tablets (50-100 mg total) by mouth every 4 (four) hours as needed for moderate pain. 30 tablet 0  Vital Signs:BP 150/79, HR 96, RR 20, Oxygena saturation 97% on room air   Physical Exam: CV-RRR, no murmur Pulmonary-Clear to auscultation  bilaterally Extremities-No LE edema Wound-2 nylon sutures removed from mid sternal wound. Sternum solid and wound is clean and dry.   Diagnostic Tests: CLINICAL DATA:  Status post aortic valve replacement on January 17, 2016. No current chest complaints.  EXAM: CHEST  2 VIEW  COMPARISON:  PA and lateral chest x-ray of January 20, 2016  FINDINGS: The lungs are adequately inflated. There is no focal infiltrate. The lung markings remain coarse in the right infrahilar region. There is no pleural effusion or pneumothorax. The heart and pulmonary vascularity are normal. The mediastinum is normal in width. The sternal wires are intact. The bony thorax exhibits no acute abnormality.  IMPRESSION: No CHF or pleural effusion or pneumothorax. Minimal right infrahilar interstitial density compatible with atelectasis or scarring.   Electronically Signed   By: David  SwazilandJordan [name]   On: 02/17/2016 13:09  Impression and Plan: Overall, [name] is recovering well from AVR and supra coronary replacement of the ascending aorta. She has already been seen by Jacolyn ReedyMichele Lenze PA-C (with Dr. Wyline MoodBranch) on 12/06. There were no changes made to her medications at that visit. She will be seen by Dr. Wyline MoodBranch  On 04/24/2016 at 1:00 pm in [place]. Patient was instructed to continue with sternal precautions (i.e. No lifting more than 10-15 pounds for the next 3-4 weeks). As long as she is not taking a narcotic for pain, she may begin driving short  distances (i.e.30 minutes or less during the day) and increase the frequency and duration as tolerates. She was encouraged to participate in cardiac rehab. Finally, she was instructed she will require prophylactic antibiotic prior to dental and other procedures as she has had an aortic valve replacement. She will return to see Dr. Tyrone SageGerhardt in 2-3 months    Ardelle BallsZIMMERMAN,Arie Powell M, PA-C [facility] [phone]  I have seen and examined Deitra MayoLinda  M Behan and agree with the above assessment  and plan.  Delight OvensEdward B Gerhardt MD [facility] [phone] Office [phone] 02/17/2016 1:38 PM

## 2016-02-29 ENCOUNTER — Ambulatory Visit (INDEPENDENT_AMBULATORY_CARE_PROVIDER_SITE_OTHER): Payer: Medicare Other | Admitting: Nurse Practitioner

## 2016-02-29 ENCOUNTER — Encounter: Payer: Self-pay | Admitting: Nurse Practitioner

## 2016-02-29 VITALS — BP 139/72 | HR 73 | Temp 97.4°F | Ht 60.0 in | Wt 216.0 lb

## 2016-02-29 DIAGNOSIS — I10 Essential (primary) hypertension: Secondary | ICD-10-CM | POA: Diagnosis not present

## 2016-02-29 DIAGNOSIS — E785 Hyperlipidemia, unspecified: Secondary | ICD-10-CM | POA: Diagnosis not present

## 2016-02-29 DIAGNOSIS — Z952 Presence of prosthetic heart valve: Secondary | ICD-10-CM | POA: Diagnosis not present

## 2016-02-29 MED ORDER — METOPROLOL TARTRATE 25 MG PO TABS: 12.5000 mg | ORAL_TABLET | Freq: Two times a day (BID) | ORAL | 3 refills | Status: DC

## 2016-02-29 MED ORDER — LISINOPRIL 5 MG PO TABS: 5.0000 mg | ORAL_TABLET | Freq: Every day | ORAL | 3 refills | Status: DC

## 2016-02-29 NOTE — Patient Instructions (Signed)
Fall Prevention in the Home Introduction Falls can cause injuries. They can happen to people of all ages. There are many things you can do to make your home safe and to help prevent falls. What can I do on the outside of my home?  Regularly fix the edges of walkways and driveways and fix any cracks.  Remove anything that might make you trip as you walk through a door, such as a raised step or threshold.  Trim any bushes or trees on the path to your home.  Use bright outdoor lighting.  Clear any walking paths of anything that might make someone trip, such as rocks or tools.  Regularly check to see if handrails are loose or broken. Make sure that both sides of any steps have handrails.  Any raised decks and porches should have guardrails on the edges.  Have any leaves, snow, or ice cleared regularly.  Use sand or salt on walking paths during winter.  Clean up any spills in your garage right away. This includes oil or grease spills. What can I do in the bathroom?  Use night lights.  Install grab bars by the toilet and in the tub and shower. Do not use towel bars as grab bars.  Use non-skid mats or decals in the tub or shower.  If you need to sit down in the shower, use a plastic, non-slip stool.  Keep the floor dry. Clean up any water that spills on the floor as soon as it happens.  Remove soap buildup in the tub or shower regularly.  Attach bath mats securely with double-sided non-slip rug tape.  Do not have throw rugs and other things on the floor that can make you trip. What can I do in the bedroom?  Use night lights.  Make sure that you have a light by your bed that is easy to reach.  Do not use any sheets or blankets that are too big for your bed. They should not hang down onto the floor.  Have a firm chair that has side arms. You can use this for support while you get dressed.  Do not have throw rugs and other things on the floor that can make you trip. What can  I do in the kitchen?  Clean up any spills right away.  Avoid walking on wet floors.  Keep items that you use a lot in easy-to-reach places.  If you need to reach something above you, use a strong step stool that has a grab bar.  Keep electrical cords out of the way.  Do not use floor polish or wax that makes floors slippery. If you must use wax, use non-skid floor wax.  Do not have throw rugs and other things on the floor that can make you trip. What can I do with my stairs?  Do not leave any items on the stairs.  Make sure that there are handrails on both sides of the stairs and use them. Fix handrails that are broken or loose. Make sure that handrails are as long as the stairways.  Check any carpeting to make sure that it is firmly attached to the stairs. Fix any carpet that is loose or worn.  Avoid having throw rugs at the top or bottom of the stairs. If you do have throw rugs, attach them to the floor with carpet tape.  Make sure that you have a light switch at the top of the stairs and the bottom of the stairs. If you   do not have them, ask someone to add them for you. What else can I do to help prevent falls?  Wear shoes that:  Do not have high heels.  Have rubber bottoms.  Are comfortable and fit you well.  Are closed at the toe. Do not wear sandals.  If you use a stepladder:  Make sure that it is fully opened. Do not climb a closed stepladder.  Make sure that both sides of the stepladder are locked into place.  Ask someone to hold it for you, if possible.  Clearly mark and make sure that you can see:  Any grab bars or handrails.  First and last steps.  Where the edge of each step is.  Use tools that help you move around (mobility aids) if they are needed. These include:  Canes.  Walkers.  Scooters.  Crutches.  Turn on the lights when you go into a dark area. Replace any light bulbs as soon as they burn out.  Set up your furniture so you have a  clear path. Avoid moving your furniture around.  If any of your floors are uneven, fix them.  If there are any pets around you, be aware of where they are.  Review your medicines with your doctor. Some medicines can make you feel dizzy. This can increase your chance of falling. Ask your doctor what other things that you can do to help prevent falls. This information is not intended to replace advice given to you by your health care provider. Make sure you discuss any questions you have with your health care provider. Document Released: 12/17/2008 Document Revised: 07/29/2015 Document Reviewed: 03/27/2014  2017 Elsevier  

## 2016-02-29 NOTE — Progress Notes (Signed)
Subjective:    Patient ID: Angela Dawson, female    DOB: Jan 03, 1946, 70 y.o.   MRN: [id number]  Patient here today for follow up of chronic medical problems.   Outpatient Encounter Prescriptions as of 08/30/2015  Medication Sig  . aspirin 81 MG tablet Take 81 mg by mouth daily.  Marland Kitchen atenolol (TENORMIN) 25 MG tablet TAKE 1 TABLET EVERY DAY  . hydrochlorothiazide (HYDRODIURIL) 25 MG tablet Take 1 tablet (25 mg total) by mouth daily.  Marland Kitchen ibuprofen (ADVIL,MOTRIN) 200 MG tablet Take 200 mg by mouth every 6 (six) hours as needed.  Marland Kitchen lisinopril (PRINIVIL,ZESTRIL) 20 MG tablet Take 1 tablet (20 mg total) by mouth daily.    Hypertension  This is a chronic problem. The current episode started more than 1 year ago. The problem is unchanged. The problem is controlled. Pertinent negatives include no chest pain, headaches, palpitations or shortness of breath. Risk factors for coronary artery disease include dyslipidemia and post-menopausal state. Past treatments include ACE inhibitors and diuretics. The current treatment provides moderate improvement. Compliance problems include diet and exercise.   Hyperlipidemia  This is a chronic problem. The current episode started more than 1 year ago. The problem is controlled. Exacerbating diseases include obesity. Pertinent negatives include no chest pain or shortness of breath. Treatments tried: cannot toleratae statin. The current treatment provides moderate improvement of lipids. There are no compliance problems.  Risk factors for coronary artery disease include dyslipidemia, family history, hypertension, obesity and post-menopausal.  aortic dilitation/aortic stenosis due to bicuspid aortic valve Keep yearly follow up with cardiology. She had open heart surgery in November for aortic aneurysm and valve replacement- says she feels great and has more energy. She starts cardiac rehab on January 16,2018.   Review of Systems  Constitutional: Negative.   HENT: Negative.    Respiratory: Negative for shortness of breath.   Cardiovascular: Negative for chest pain, palpitations and leg swelling.  Gastrointestinal: Negative.   Genitourinary: Negative.   Musculoskeletal: Negative.   Neurological: Negative.  Negative for headaches.  Psychiatric/Behavioral: Negative.   All other systems reviewed and are negative.      Objective:   Physical Exam  Constitutional: She is oriented to person, place, and time. She appears well-developed and well-nourished.  HENT:  Nose: Nose normal.  Mouth/Throat: Oropharynx is clear and moist.  Eyes: EOM are normal.  Neck: Trachea normal, normal range of motion and full passive range of motion without pain. Neck supple. No JVD present. Carotid bruit is not present. No thyromegaly present.  Cardiovascular: Normal rate, regular rhythm and intact distal pulses.  Exam reveals no gallop and no friction rub.   Murmur (1/6 systolic murmur) heard. Pulmonary/Chest: Effort normal and breath sounds normal.  Abdominal: Soft. Bowel sounds are normal. She exhibits no distension and no mass. There is no tenderness.  Musculoskeletal: Normal range of motion.  Lymphadenopathy:    She has no cervical adenopathy.  Neurological: She is alert and oriented to person, place, and time. She has normal reflexes.  Skin: Skin is warm and dry.  Psychiatric: She has a normal mood and affect. Her behavior is normal. Judgment and thought content normal.   BP 139/72   Pulse 73   Temp 97.4 F (36.3 C) (Oral)   Ht 5' (1.524 m)   Wt 216 lb (98 kg)   BMI 42.18 kg/m       Assessment & Plan:  1. Essential hypertension Low sodium diet - lisinopril (PRINIVIL,ZESTRIL) 5 MG tablet; Take 1 tablet (  5 mg total) by mouth daily.  Dispense: 30 tablet; Refill: 3 - CMP14+EGFR  2. Hyperlipidemia, unspecified hyperlipidemia type Low fat diet - Lipid panel  3. S/P AVR (aortic valve replacement) - metoprolol tartrate (LOPRESSOR) 25 MG tablet; Take 0.5 tablets (12.5  mg total) by mouth 2 (two) times daily.  Dispense: 60 tablet; Refill: 3  4. Severe obesity (BMI >= 40) (HCC) Discussed diet and exercise for person with BMI >25 Will recheck weight in 3-6 months     Labs pending Health maintenance reviewed Diet and exercise encouraged Continue all meds Follow up  In 6 months    [place], FNP

## 2016-03-01 ENCOUNTER — Other Ambulatory Visit: Payer: Self-pay | Admitting: Nurse Practitioner

## 2016-03-01 LAB — CMP14+EGFR
A/G RATIO: 1.2 (ref 1.2–2.2)
ALBUMIN: 3.7 g/dL (ref 3.6–4.8)
ALK PHOS: 81 IU/L (ref 39–117)
ALT: 20 IU/L (ref 0–32)
AST: 21 IU/L (ref 0–40)
BILIRUBIN TOTAL: 0.3 mg/dL (ref 0.0–1.2)
BUN / CREAT RATIO: 17 (ref 12–28)
BUN: 12 mg/dL (ref 8–27)
CHLORIDE: 103 mmol/L (ref 96–106)
CO2: 24 mmol/L (ref 18–29)
Calcium: 9.1 mg/dL (ref 8.7–10.3)
Creatinine, Ser: 0.69 mg/dL (ref 0.57–1.00)
GFR calc non Af Amer: 89 mL/min/{1.73_m2} (ref >59)
GFR, EST AFRICAN AMERICAN: 103 mL/min/{1.73_m2} (ref >59)
GLOBULIN, TOTAL: 3 g/dL (ref 1.5–4.5)
GLUCOSE: 90 mg/dL (ref 65–99)
Potassium: 4.4 mmol/L (ref 3.5–5.2)
SODIUM: 142 mmol/L (ref 134–144)
TOTAL PROTEIN: 6.7 g/dL (ref 6.0–8.5)

## 2016-03-01 LAB — LIPID PANEL
CHOLESTEROL TOTAL: 188 mg/dL (ref 100–199)
Chol/HDL Ratio: 4.4 ratio units (ref 0.0–4.4)
HDL: 43 mg/dL (ref >39)
LDL CALC: 120 mg/dL — AB (ref 0–99)
Triglycerides: 127 mg/dL (ref 0–149)
VLDL CHOLESTEROL CAL: 25 mg/dL (ref 5–40)

## 2016-03-21 ENCOUNTER — Encounter (HOSPITAL_COMMUNITY): Payer: Medicare Other

## 2016-04-13 ENCOUNTER — Encounter (HOSPITAL_COMMUNITY)
Admission: RE | Admit: 2016-04-13 | Discharge: 2016-04-13 | Disposition: A | Discharge status: Home / Self Care | Payer: Medicare Other | Source: Ambulatory Visit | Attending: Cardiology | Admitting: Cardiology

## 2016-04-13 ENCOUNTER — Encounter (HOSPITAL_COMMUNITY): Payer: Self-pay

## 2016-04-13 VITALS — BP 158/60 | HR 74 | Ht 60.0 in | Wt 212.1 lb

## 2016-04-13 DIAGNOSIS — I10 Essential (primary) hypertension: Secondary | ICD-10-CM | POA: Insufficient documentation

## 2016-04-13 DIAGNOSIS — R011 Cardiac murmur, unspecified: Secondary | ICD-10-CM | POA: Diagnosis not present

## 2016-04-13 DIAGNOSIS — Z952 Presence of prosthetic heart valve: Secondary | ICD-10-CM | POA: Insufficient documentation

## 2016-04-13 DIAGNOSIS — Z87891 Personal history of nicotine dependence: Secondary | ICD-10-CM | POA: Diagnosis not present

## 2016-04-13 NOTE — Progress Notes (Signed)
Cardiac/Pulmonary Rehab Medication Review by a Pharmacist  Does the patient  feel that his/her medications are working for him/her?  yes  Has the patient been experiencing any side effects to the medications prescribed?  no  Does the patient measure his/her own blood pressure or blood glucose at home?  yes   Does the patient have any problems obtaining medications due to transportation or finances?   no  Understanding of regimen: excellent Understanding of indications: excellent Potential of compliance: excellent  Questions asked to Determine Patient Understanding of Medication Regimen:  1. What is the name of the medication?  2. What is the medication used for?  3. When should it be taken?  4. How much should be taken?  5. How will you take it?  6. What side effects should you report?  Understanding Defined as: Excellent: All questions above are correct Good: Questions 1-4 are correct Fair: Questions 1-2 are correct  Poor: 1 or none of the above questions are correct   Pharmacist comments: No side effects noted.  Pt will start checking BP at home, has machine.  Med list is current and up to date.  No problems noted.    Valrie HartHall, Tate Zagal A 04/13/2016 1:56 PM

## 2016-04-13 NOTE — Progress Notes (Signed)
Cardiac Individual Treatment Plan  Patient Details  Name: Angela Dawson MRN: [id number] Date of Birth: 10/01/1945 Referring Provider:   Flowsheet Row [facility]  Referring Provider  Dr. Wyline MoodBranch      Initial Encounter Date:  Flowsheet Row CARDIAC REHAB PHASE II [facility]  Date  04/13/16  Referring Provider  Dr. Wyline MoodBranch      Visit Diagnosis: S/P AVR (aortic valve replacement)  Patient's Home Medications on Admission:  Current Outpatient Prescriptions:  .  acetaminophen (TYLENOL) 325 MG tablet, Take 2 tablets (650 mg total) by mouth every 6 (six) hours as needed for mild pain., Disp: , Rfl:  .  aspirin EC 325 MG EC tablet, Take 1 tablet (325 mg total) by mouth daily., Disp: 30 tablet, Rfl: 0 .  cetirizine (ZYRTEC) 10 MG tablet, TAKE 1 TABLET EVERY DAY, Disp: 90 tablet, Rfl: 0 .  lisinopril (PRINIVIL,ZESTRIL) 5 MG tablet, Take 1 tablet (5 mg total) by mouth daily., Disp: 30 tablet, Rfl: 3 .  metoprolol tartrate (LOPRESSOR) 25 MG tablet, Take 0.5 tablets (12.5 mg total) by mouth 2 (two) times daily., Disp: 60 tablet, Rfl: 3  Past Medical History: Past Medical History:  Diagnosis Date  . Allergy    seasonal  . Chicken pox   . Complication of anesthesia    difficult to wake up after tubal ligations  . Dyspnea    with exertion  . Heart murmur   . History of bronchitis   . History of kidney stones    "only one"  . Hypertension   . Measles     Tobacco Use: History  Smoking Status  . Former Smoker  . Packs/day: 0.50  . Years: 5.00  . Types: Cigarettes  . Start date: 08/04/1968  . Quit date: 10/10/1973  Smokeless Tobacco  . Never Used    Comment: smoked less than 1 pack every 2 weeks. Havent smoked in atleast 40 years    Labs: Recent Review Flowsheet Data    Labs for ITP Cardiac and Pulmonary Rehab Latest Ref Rng & Units 01/18/2016 01/18/2016  01/18/2016 01/18/2016 02/29/2016   Cholestrol 100 - 199 mg/dL - - - - 409188   LDLCALC 0 - 99 mg/dL - - - - 811(B120(H)   HDL >14>39 mg/dL - - - - 43   Trlycerides 0 - 149 mg/dL - - - - 782127   Hemoglobin A1c 4.8 - 5.6 % - - - - -   PHART 7.350 - 7.450 7.495(H) 7.375 7.394 - -   PCO2ART 32.0 - 48.0 mmHg 33.2 40.7 38.6 - -   HCO3 20.0 - 28.0 mmol/L 25.4 23.7 23.5 - -   TCO2 0 - 100 mmol/L 26 25 25 24  -   ACIDBASEDEF 0.0 - 2.0 mmol/L - 1.0 1.0 - -   O2SAT % 99.0 97.0 96.0 - -      Capillary Blood Glucose: Lab Results  Component Value Date   GLUCAP 100 (H) 01/22/2016   GLUCAP 109 (H) 01/22/2016   GLUCAP 103 (H) 01/22/2016   GLUCAP 101 (H) 01/21/2016   GLUCAP 115 (H) 01/21/2016     Exercise Target Goals: Date: 04/13/16  Exercise Program Goal: Individual exercise prescription set with THRR, safety & activity barriers. Participant demonstrates ability to understand and report RPE using BORG scale, to self-measure pulse accurately, and to acknowledge the importance of the exercise prescription.  Exercise Prescription Goal: Starting with aerobic  activity 30 plus minutes a day, 3 days per week for initial exercise prescription. Provide home exercise prescription and guidelines that participant acknowledges understanding prior to discharge.  Activity Barriers & Risk Stratification:     Activity Barriers & Cardiac Risk Stratification - 04/13/16 1448      Activity Barriers & Cardiac Risk Stratification   Activity Barriers None   Cardiac Risk Stratification High      6 Minute Walk:     6 Minute Walk    Row Name 04/13/16 1443         6 Minute Walk   Phase Initial     Distance 1500 feet     Distance % Change 0 %     Walk Time 6 minutes     # of Rest Breaks 0     MPH 2.84     METS 3.17     RPE 13     Perceived Dyspnea  14     VO2 Peak 11.78     Symptoms No     Resting HR 74 bpm     Resting BP 158/80     Max Ex. HR 124 bpm     Max Ex. BP 198/100     2 Minute Post BP 162/92         Initial Exercise Prescription:     Initial Exercise Prescription - 04/13/16 1200      Date of Initial Exercise RX and Referring Provider   Date 04/13/16   Referring Provider Dr. Marianna Fuss   Level 2   Watts 15   Minutes 15   METs 1.9     Arm Ergometer   Level 2   Watts 20   Minutes 20   METs 2.8     Prescription Details   Frequency (times per week) 3   Duration Progress to 30 minutes of continuous aerobic without signs/symptoms of physical distress     Intensity   THRR REST +  30   THRR 40-80% of Max Heartrate 105-120-136   Ratings of Perceived Exertion 11-13   Perceived Dyspnea 0-4     Progression   Progression Continue progressive overload as per policy without signs/symptoms or physical distress.     Resistance Training   Training Prescription Yes   Weight 1   Reps 10-12      Perform Capillary Blood Glucose checks as needed.  Exercise Prescription Changes:   Exercise Comments:    Discharge Exercise Prescription (Final Exercise Prescription Changes):   Nutrition:  Target Goals: Understanding of nutrition guidelines, daily intake of sodium 1500mg , cholesterol 200mg , calories 30% from fat and 7% or less from saturated fats, daily to have 5 or more servings of fruits and vegetables.  Biometrics:     Pre Biometrics - 04/13/16 1253      Pre Biometrics   Height 5' (1.524 m)   Weight 212 lb 1.3 oz (96.2 kg)   Waist Circumference 41 inches   Hip Circumference 47 inches   Waist to Hip Ratio 0.87 %   BMI (Calculated) 41.5   Triceps Skinfold 30 mm   % Body Fat 50 %   Grip Strength 50 kg   Flexibility 0 in   Single Leg Stand 5 seconds       Nutrition Therapy Plan and Nutrition Goals:   Nutrition Discharge: Rate Your Plate Scores:     Nutrition Assessments - 04/13/16 1450      MEDFICTS Scores   Pre  Score 30      Nutrition Goals Re-Evaluation:   Psychosocial: Target Goals: Acknowledge presence or absence of  depression, maximize coping skills, provide positive support system. Participant is able to verbalize types and ability to use techniques and skills needed for reducing stress and depression.  Initial Review & Psychosocial Screening:     Initial Psych Review & Screening - 04/13/16 1455      Family Dynamics   Good Support System? Yes     Barriers   Psychosocial barriers to participate in program There are no identifiable barriers or psychosocial needs.     Screening Interventions   Interventions Encouraged to exercise      Quality of Life Scores:     Quality of Life - 04/13/16 1253      Quality of Life Scores   Health/Function Pre 29.54 %   Socioeconomic Pre 29.14 %   Psych/Spiritual Pre 29.14 %   Family Pre 30 %   GLOBAL Pre 29.42 %      PHQ-9: Recent Review Flowsheet Data    Depression screen [facility] 04/13/2016 02/29/2016 11/30/2015 10/11/2015 08/30/2015   Decreased Interest 0 0 0 0 0   Down, Depressed, Hopeless 0 0 0 0 0   PHQ - 2 Score 0 0 0 0 0   Altered sleeping 0 - - - -   Tired, decreased energy 0 - - - -   Change in appetite 0 - - - -   Feeling bad or failure about yourself  0 - - - -   Trouble concentrating 0 - - - -   Moving slowly or fidgety/restless 0 - - - -   Suicidal thoughts 0 - - - -   PHQ-9 Score 0 - - - -      Psychosocial Evaluation and Intervention:     Psychosocial Evaluation - 04/13/16 1456      Psychosocial Evaluation & Interventions   Interventions Encouraged to exercise with the program and follow exercise prescription   Comments Patient has no psychosocial barriers identified at orientation.    Continued Psychosocial Services Needed No      Psychosocial Re-Evaluation:   Vocational Rehabilitation: Provide vocational rehab assistance to qualifying candidates.   Vocational Rehab Evaluation & Intervention:     Vocational Rehab - 04/13/16 1450      Initial Vocational Rehab Evaluation & Intervention   Assessment shows need for  Vocational Rehabilitation No      Education: Education Goals: Education classes will be provided on a weekly basis, covering required topics. Participant will state understanding/return demonstration of topics presented.  Learning Barriers/Preferences:     Learning Barriers/Preferences - 04/13/16 1449      Learning Barriers/Preferences   Learning Barriers None   Learning Preferences Skilled Demonstration      Education Topics: Hypertension, Hypertension Reduction -Define heart disease and high blood pressure. Discus how high blood pressure affects the body and ways to reduce high blood pressure.   Exercise and Your Heart -Discuss why it is important to exercise, the FITT principles of exercise, normal and abnormal responses to exercise, and how to exercise safely.   Angina -Discuss definition of angina, causes of angina, treatment of angina, and how to decrease risk of having angina.   Cardiac Medications -Review what the following cardiac medications are used for, how they affect the body, and side effects that may occur when taking the medications.  Medications include Aspirin, Beta blockers, calcium channel blockers, ACE Inhibitors, angiotensin receptor blockers, diuretics,  digoxin, and antihyperlipidemics.   Congestive Heart Failure -Discuss the definition of CHF, how to live with CHF, the signs and symptoms of CHF, and how keep track of weight and sodium intake.   Heart Disease and Intimacy -Discus the effect sexual activity has on the heart, how changes occur during intimacy as we age, and safety during sexual activity.   Smoking Cessation / COPD -Discuss different methods to quit smoking, the health benefits of quitting smoking, and the definition of COPD.   Nutrition I: Fats -Discuss the types of cholesterol, what cholesterol does to the heart, and how cholesterol levels can be controlled.   Nutrition II: Labels -Discuss the different components of food  labels and how to read food label   Heart Parts and Heart Disease -Discuss the anatomy of the heart, the pathway of blood circulation through the heart, and these are affected by heart disease.   Stress I: Signs and Symptoms -Discuss the causes of stress, how stress may lead to anxiety and depression, and ways to limit stress.   Stress II: Relaxation -Discuss different types of relaxation techniques to limit stress.   Warning Signs of Stroke / TIA -Discuss definition of a stroke, what the signs and symptoms are of a stroke, and how to identify when someone is having stroke.   Knowledge Questionnaire Score:     Knowledge Questionnaire Score - 04/13/16 1449      Knowledge Questionnaire Score   Pre Score 21/24      Core Components/Risk Factors/Patient Goals at Admission:     Personal Goals and Risk Factors at Admission - 04/13/16 1451      Core Components/Risk Factors/Patient Goals on Admission    Weight Management Obesity;Yes   Admit Weight 212 lb (96.2 kg)   Goal Weight: Short Term 202 lb (91.6 kg)   Goal Weight: Long Term 187 lb (84.8 kg)   Expected Outcomes Short Term: Continue to assess and modify interventions until short term weight is achieved   Increase Strength and Stamina Yes   Intervention Provide advice, education, support and counseling about physical activity/exercise needs.;Develop an individualized exercise prescription for aerobic and resistive training based on initial evaluation findings, risk stratification, comorbidities and participant's personal goals.   Expected Outcomes Achievement of increased cardiorespiratory fitness and enhanced flexibility, muscular endurance and strength shown through measurements of functional capacity and personal statement of participant.   Personal Goal Other Yes   Personal Goal Lose 25 lbs at graduation. Lose 2 lbs/week. Get stronger; be able to walk more.   Intervention Patient will attend cardiac rehab 3 days/week and  supplement with exercise at home 2 days/week.    Expected Outcomes Patient will meet his personal goal.       Core Components/Risk Factors/Patient Goals Review:      Goals and Risk Factor Review    Row Name 04/13/16 1455             Core Components/Risk Factors/Patient Goals Review   Personal Goals Review Weight Management/Obesity;Increase Strength and Stamina          Core Components/Risk Factors/Patient Goals at Discharge (Final Review):      Goals and Risk Factor Review - 04/13/16 1455      Core Components/Risk Factors/Patient Goals Review   Personal Goals Review Weight Management/Obesity;Increase Strength and Stamina      ITP Comments:     ITP Comments    Row Name 04/13/16 1534           ITP Comments  Patient new to program. Plans to start 04/17/16.          Comments: ITP 30 Day REVIEW Patient new to program. Plans to start 04/17/16.

## 2016-04-13 NOTE — Progress Notes (Signed)
Cardiac Individual Treatment Plan  Patient Details  Name: Angela MayoLinda M Tamer MRN: [id number] Date of Birth: 10/01/1945 Referring Provider:   Flowsheet Row [facility]  Referring Provider  Dr. Wyline MoodBranch      Initial Encounter Date:  Flowsheet Row CARDIAC REHAB PHASE II [facility]  Date  04/13/16  Referring Provider  Dr. Wyline MoodBranch      Visit Diagnosis: S/P AVR (aortic valve replacement)  Patient's Home Medications on Admission:  Current Outpatient Prescriptions:  .  acetaminophen (TYLENOL) 325 MG tablet, Take 2 tablets (650 mg total) by mouth every 6 (six) hours as needed for mild pain., Disp: , Rfl:  .  aspirin EC 325 MG EC tablet, Take 1 tablet (325 mg total) by mouth daily., Disp: 30 tablet, Rfl: 0 .  cetirizine (ZYRTEC) 10 MG tablet, TAKE 1 TABLET EVERY DAY, Disp: 90 tablet, Rfl: 0 .  lisinopril (PRINIVIL,ZESTRIL) 5 MG tablet, Take 1 tablet (5 mg total) by mouth daily., Disp: 30 tablet, Rfl: 3 .  metoprolol tartrate (LOPRESSOR) 25 MG tablet, Take 0.5 tablets (12.5 mg total) by mouth 2 (two) times daily., Disp: 60 tablet, Rfl: 3  Past Medical History: Past Medical History:  Diagnosis Date  . Allergy    seasonal  . Chicken pox   . Complication of anesthesia    difficult to wake up after tubal ligations  . Dyspnea    with exertion  . Heart murmur   . History of bronchitis   . History of kidney stones    "only one"  . Hypertension   . Measles     Tobacco Use: History  Smoking Status  . Former Smoker  . Packs/day: 0.50  . Years: 5.00  . Types: Cigarettes  . Start date: 08/04/1968  . Quit date: 10/10/1973  Smokeless Tobacco  . Never Used    Comment: smoked less than 1 pack every 2 weeks. Havent smoked in atleast 40 years    Labs: Recent Review Flowsheet Data    Labs for [facility] Cardiac and Pulmonary Rehab Latest Ref Rng & Units 01/18/2016 01/18/2016  01/18/2016 01/18/2016 02/29/2016   Cholestrol 100 - 199 mg/dL - - - - 409188   LDLCALC 0 - 99 mg/dL - - - - 811(B120(H)   HDL >14>39 mg/dL - - - - 43   Trlycerides 0 - 149 mg/dL - - - - 782127   Hemoglobin A1c 4.8 - 5.6 % - - - - -   PHART 7.350 - 7.450 7.495(H) 7.375 7.394 - -   PCO2ART 32.0 - 48.0 mmHg 33.2 40.7 38.6 - -   HCO3 20.0 - 28.0 mmol/L 25.4 23.7 23.5 - -   TCO2 0 - 100 mmol/L 26 25 25 24  -   ACIDBASEDEF 0.0 - 2.0 mmol/L - 1.0 1.0 - -   O2SAT % 99.0 97.0 96.0 - -      Capillary Blood Glucose: Lab Results  Component Value Date   GLUCAP 100 (H) 01/22/2016   GLUCAP 109 (H) 01/22/2016   GLUCAP 103 (H) 01/22/2016   GLUCAP 101 (H) 01/21/2016   GLUCAP 115 (H) 01/21/2016     Exercise Target Goals: Date: 04/13/16  Exercise Program Goal: Individual exercise prescription set with THRR, safety & activity barriers. Participant demonstrates ability to understand and report RPE using BORG scale, to self-measure pulse accurately, and to acknowledge the importance of the exercise prescription.  Exercise Prescription Goal: Starting with aerobic  activity 30 plus minutes a day, 3 days per week for initial exercise prescription. Provide home exercise prescription and guidelines that participant acknowledges understanding prior to discharge.  Activity Barriers & Risk Stratification:     Activity Barriers & Cardiac Risk Stratification - 04/13/16 1448      Activity Barriers & Cardiac Risk Stratification   Activity Barriers None   Cardiac Risk Stratification High      6 Minute Walk:     6 Minute Walk    Row Name 04/13/16 1443         6 Minute Walk   Phase Initial     Distance 1500 feet     Distance % Change 0 %     Walk Time 6 minutes     # of Rest Breaks 0     MPH 2.84     METS 3.17     RPE 13     Perceived Dyspnea  14     VO2 Peak 11.78     Symptoms No     Resting HR 74 bpm     Resting BP 158/80     Max Ex. HR 124 bpm     Max Ex. BP 198/100     2 Minute Post BP 162/92         Initial Exercise Prescription:     Initial Exercise Prescription - 04/13/16 1200      Date of Initial Exercise RX and Referring Provider   Date 04/13/16   Referring Provider Dr. Marianna Fuss   Level 2   Watts 15   Minutes 15   METs 1.9     Arm Ergometer   Level 2   Watts 20   Minutes 20   METs 2.8     Prescription Details   Frequency (times per week) 3   Duration Progress to 30 minutes of continuous aerobic without signs/symptoms of physical distress     Intensity   THRR REST +  30   THRR 40-80% of Max Heartrate 105-120-136   Ratings of Perceived Exertion 11-13   Perceived Dyspnea 0-4     Progression   Progression Continue progressive overload as per policy without signs/symptoms or physical distress.     Resistance Training   Training Prescription Yes   Weight 1   Reps 10-12      Perform Capillary Blood Glucose checks as needed.  Exercise Prescription Changes:   Exercise Comments:    Discharge Exercise Prescription (Final Exercise Prescription Changes):   Nutrition:  Target Goals: Understanding of nutrition guidelines, daily intake of sodium 1500mg , cholesterol 200mg , calories 30% from fat and 7% or less from saturated fats, daily to have 5 or more servings of fruits and vegetables.  Biometrics:     Pre Biometrics - 04/13/16 1253      Pre Biometrics   Height 5' (1.524 m)   Weight 212 lb 1.3 oz (96.2 kg)   Waist Circumference 41 inches   Hip Circumference 47 inches   Waist to Hip Ratio 0.87 %   BMI (Calculated) 41.5   Triceps Skinfold 30 mm   % Body Fat 50 %   Grip Strength 50 kg   Flexibility 0 in   Single Leg Stand 5 seconds       Nutrition Therapy Plan and Nutrition Goals:   Nutrition Discharge: Rate Your Plate Scores:     Nutrition Assessments - 04/13/16 1450      MEDFICTS Scores   Pre  Score 30      Nutrition Goals Re-Evaluation:   Psychosocial: Target Goals: Acknowledge presence or absence of  depression, maximize coping skills, provide positive support system. Participant is able to verbalize types and ability to use techniques and skills needed for reducing stress and depression.  Initial Review & Psychosocial Screening:     Initial Psych Review & Screening - 04/13/16 1455      Family Dynamics   Good Support System? Yes     Barriers   Psychosocial barriers to participate in program There are no identifiable barriers or psychosocial needs.     Screening Interventions   Interventions Encouraged to exercise      Quality of Life Scores:     Quality of Life - 04/13/16 1253      Quality of Life Scores   Health/Function Pre 29.54 %   Socioeconomic Pre 29.14 %   Psych/Spiritual Pre 29.14 %   Family Pre 30 %   GLOBAL Pre 29.42 %      PHQ-9: Recent Review Flowsheet Data    Depression screen [facility] 04/13/2016 02/29/2016 11/30/2015 10/11/2015 08/30/2015   Decreased Interest 0 0 0 0 0   Down, Depressed, Hopeless 0 0 0 0 0   PHQ - 2 Score 0 0 0 0 0   Altered sleeping 0 - - - -   Tired, decreased energy 0 - - - -   Change in appetite 0 - - - -   Feeling bad or failure about yourself  0 - - - -   Trouble concentrating 0 - - - -   Moving slowly or fidgety/restless 0 - - - -   Suicidal thoughts 0 - - - -   PHQ-9 Score 0 - - - -      Psychosocial Evaluation and Intervention:     Psychosocial Evaluation - 04/13/16 1456      Psychosocial Evaluation & Interventions   Interventions Encouraged to exercise with the program and follow exercise prescription   Comments Patient has no psychosocial barriers identified at orientation.    Continued Psychosocial Services Needed No      Psychosocial Re-Evaluation:   Vocational Rehabilitation: Provide vocational rehab assistance to qualifying candidates.   Vocational Rehab Evaluation & Intervention:     Vocational Rehab - 04/13/16 1450      Initial Vocational Rehab Evaluation & Intervention   Assessment shows need for  Vocational Rehabilitation No      Education: Education Goals: Education classes will be provided on a weekly basis, covering required topics. Participant will state understanding/return demonstration of topics presented.  Learning Barriers/Preferences:     Learning Barriers/Preferences - 04/13/16 1449      Learning Barriers/Preferences   Learning Barriers None   Learning Preferences Skilled Demonstration      Education Topics: Hypertension, Hypertension Reduction -Define heart disease and high blood pressure. Discus how high blood pressure affects the body and ways to reduce high blood pressure.   Exercise and Your Heart -Discuss why it is important to exercise, the FITT principles of exercise, normal and abnormal responses to exercise, and how to exercise safely.   Angina -Discuss definition of angina, causes of angina, treatment of angina, and how to decrease risk of having angina.   Cardiac Medications -Review what the following cardiac medications are used for, how they affect the body, and side effects that may occur when taking the medications.  Medications include Aspirin, Beta blockers, calcium channel blockers, ACE Inhibitors, angiotensin receptor blockers, diuretics,  digoxin, and antihyperlipidemics.   Congestive Heart Failure -Discuss the definition of CHF, how to live with CHF, the signs and symptoms of CHF, and how keep track of weight and sodium intake.   Heart Disease and Intimacy -Discus the effect sexual activity has on the heart, how changes occur during intimacy as we age, and safety during sexual activity.   Smoking Cessation / COPD -Discuss different methods to quit smoking, the health benefits of quitting smoking, and the definition of COPD.   Nutrition I: Fats -Discuss the types of cholesterol, what cholesterol does to the heart, and how cholesterol levels can be controlled.   Nutrition II: Labels -Discuss the different components of food  labels and how to read food label   Heart Parts and Heart Disease -Discuss the anatomy of the heart, the pathway of blood circulation through the heart, and these are affected by heart disease.   Stress I: Signs and Symptoms -Discuss the causes of stress, how stress may lead to anxiety and depression, and ways to limit stress.   Stress II: Relaxation -Discuss different types of relaxation techniques to limit stress.   Warning Signs of Stroke / TIA -Discuss definition of a stroke, what the signs and symptoms are of a stroke, and how to identify when someone is having stroke.   Knowledge Questionnaire Score:     Knowledge Questionnaire Score - 04/13/16 1449      Knowledge Questionnaire Score   Pre Score 21/24      Core Components/Risk Factors/Patient Goals at Admission:     Personal Goals and Risk Factors at Admission - 04/13/16 1451      Core Components/Risk Factors/Patient Goals on Admission    Weight Management Obesity;Yes   Admit Weight 212 lb (96.2 kg)   Goal Weight: Short Term 202 lb (91.6 kg)   Goal Weight: Long Term 187 lb (84.8 kg)   Expected Outcomes Short Term: Continue to assess and modify interventions until short term weight is achieved   Increase Strength and Stamina Yes   Intervention Provide advice, education, support and counseling about physical activity/exercise needs.;Develop an individualized exercise prescription for aerobic and resistive training based on initial evaluation findings, risk stratification, comorbidities and participant's personal goals.   Expected Outcomes Achievement of increased cardiorespiratory fitness and enhanced flexibility, muscular endurance and strength shown through measurements of functional capacity and personal statement of participant.   Personal Goal Other Yes   Personal Goal Lose 25 lbs at graduation. Lose 2 lbs/week. Get stronger; be able to walk more.   Intervention Patient will attend cardiac rehab 3 days/week and  supplement with exercise at home 2 days/week.    Expected Outcomes Patient will meet his personal goal.       Core Components/Risk Factors/Patient Goals Review:      Goals and Risk Factor Review    Row Name 04/13/16 1455             Core Components/Risk Factors/Patient Goals Review   Personal Goals Review Weight Management/Obesity;Increase Strength and Stamina          Core Components/Risk Factors/Patient Goals at Discharge (Final Review):      Goals and Risk Factor Review - 04/13/16 1455      Core Components/Risk Factors/Patient Goals Review   Personal Goals Review Weight Management/Obesity;Increase Strength and Stamina      ITP Comments:   Comments: Patient arrived for 1st visit/orientation/education at 1230. Patient was referred to CR by Dr. Wyline Mood due to S/P Aortic Valve Replacement (Z95.2)  During orientation advised patient on arrival and appointment times what to wear, what to do before, during and after exercise. Reviewed attendance and class policy. Talked about inclement weather and class consultation policy. Pt is scheduled to return Cardiac Rehab on 04/17/16 at 11:00. Pt was advised to come to class 15 minutes before class starts. Patient was also given instructions on meeting with the dietician and attending the Family Structure classes. Pt is eager to get started. Patient participated in warm up stretches followed by light weights and resistance bands. Patient was able to complete 6 minute walk test. Patient was measured for the equipment. Discussed equipment safety with patient. Took patient pre-anthropometric measurements. Patient finished visit at 1430.

## 2016-04-17 ENCOUNTER — Encounter (HOSPITAL_COMMUNITY)
Admission: RE | Admit: 2016-04-17 | Discharge: 2016-04-17 | Disposition: A | Discharge status: Home / Self Care | Payer: Medicare Other | Source: Ambulatory Visit | Attending: Cardiology | Admitting: Cardiology

## 2016-04-17 DIAGNOSIS — Z87891 Personal history of nicotine dependence: Secondary | ICD-10-CM | POA: Diagnosis not present

## 2016-04-17 DIAGNOSIS — Z952 Presence of prosthetic heart valve: Secondary | ICD-10-CM | POA: Diagnosis not present

## 2016-04-17 DIAGNOSIS — I10 Essential (primary) hypertension: Secondary | ICD-10-CM | POA: Diagnosis not present

## 2016-04-17 DIAGNOSIS — R011 Cardiac murmur, unspecified: Secondary | ICD-10-CM | POA: Diagnosis not present

## 2016-04-17 NOTE — Progress Notes (Signed)
Daily Session Note  Patient Details  Name: Angela Dawson MRN: [id number] Date of Birth: June 24, 1945 Referring Provider:   Flowsheet Row CARDIAC REHAB PHASE II ORIENTATION from 04/13/2016 in [place]  Referring Provider  Dr. Harl Bowie      Encounter Date: 04/17/2016  Check In:     Session Check In - 04/17/16 1054      Check-In   Location [facility]   Staff Present Suzanne Boron, BS, EP, Exercise Physiologist;Debra Wynetta Emery, RN, BSN   Supervising physician immediately available to respond to emergencies See telemetry face sheet for immediately available MD   Medication changes reported     No   Fall or balance concerns reported    No   Warm-up and Cool-down Performed as group-led instruction   Resistance Training Performed Yes   VAD Patient? No     Pain Assessment   Currently in Pain? No/denies   Pain Score 0-No pain   Multiple Pain Sites No      Capillary Blood Glucose: No results found for this or any previous visit (from the past 24 hour(s)).   Goals Met:  Independence with exercise equipment Exercise tolerated well No report of cardiac concerns or symptoms Strength training completed today  Goals Unmet:  Not Applicable  Comments: Check out 1200   Dr. Kate Sable is Medical Director for [place] and Pulmonary Rehab.

## 2016-04-19 ENCOUNTER — Encounter (HOSPITAL_COMMUNITY)
Admission: RE | Admit: 2016-04-19 | Discharge: 2016-04-19 | Disposition: A | Discharge status: Home / Self Care | Payer: Medicare Other | Source: Ambulatory Visit | Attending: Cardiology | Admitting: Cardiology

## 2016-04-19 DIAGNOSIS — R011 Cardiac murmur, unspecified: Secondary | ICD-10-CM | POA: Diagnosis not present

## 2016-04-19 DIAGNOSIS — Z952 Presence of prosthetic heart valve: Secondary | ICD-10-CM

## 2016-04-19 DIAGNOSIS — I10 Essential (primary) hypertension: Secondary | ICD-10-CM | POA: Diagnosis not present

## 2016-04-19 DIAGNOSIS — Z87891 Personal history of nicotine dependence: Secondary | ICD-10-CM | POA: Diagnosis not present

## 2016-04-19 NOTE — Progress Notes (Signed)
Daily Session Note  Patient Details  Name: Angela Dawson MRN: [id number] Date of Birth: 1945-03-13 Referring Provider:   Flowsheet Row CARDIAC REHAB PHASE II ORIENTATION from 04/13/2016 in [place]  Referring Provider  Dr. Harl Bowie      Encounter Date: 04/19/2016  Check In:     Session Check In - 04/19/16 1100      Check-In   Location [facility]   Staff Present Aundra Dubin, RN, BSN;Carollynn Pennywell Luther Parody, BS, EP, Exercise Physiologist   Supervising physician immediately available to respond to emergencies See telemetry face sheet for immediately available MD   Medication changes reported     No   Fall or balance concerns reported    No   Warm-up and Cool-down Performed as group-led instruction   Resistance Training Performed Yes   VAD Patient? No     Pain Assessment   Currently in Pain? No/denies   Pain Score 0-No pain   Multiple Pain Sites No      Capillary Blood Glucose: No results found for this or any previous visit (from the past 24 hour(s)).   Goals Met:  Independence with exercise equipment Exercise tolerated well No report of cardiac concerns or symptoms Strength training completed today  Goals Unmet:  Not Applicable  Comments: Check out 1200   Dr. Kate Sable is Medical Director for [place] and Pulmonary Rehab.

## 2016-04-21 ENCOUNTER — Encounter (HOSPITAL_COMMUNITY)
Admission: RE | Admit: 2016-04-21 | Discharge: 2016-04-21 | Disposition: A | Discharge status: Home / Self Care | Payer: Medicare Other | Source: Ambulatory Visit | Attending: Cardiology | Admitting: Cardiology

## 2016-04-21 DIAGNOSIS — Z952 Presence of prosthetic heart valve: Secondary | ICD-10-CM

## 2016-04-21 DIAGNOSIS — I10 Essential (primary) hypertension: Secondary | ICD-10-CM | POA: Diagnosis not present

## 2016-04-21 DIAGNOSIS — R011 Cardiac murmur, unspecified: Secondary | ICD-10-CM | POA: Diagnosis not present

## 2016-04-21 DIAGNOSIS — Z87891 Personal history of nicotine dependence: Secondary | ICD-10-CM | POA: Diagnosis not present

## 2016-04-21 NOTE — Progress Notes (Signed)
Daily Session Note  Patient Details  Name: Angela Dawson MRN: [id number] Date of Birth: 01/11/1946 Referring Provider:   Flowsheet Row CARDIAC REHAB PHASE II ORIENTATION from 04/13/2016 in [place]  Referring Provider  Dr. Harl Bowie      Encounter Date: 04/21/2016  Check In:     Session Check In - 04/21/16 1108      Check-In   Location [facility]   Staff Present Aundra Dubin, RN, BSN;Antwyne Pingree Luther Parody, BS, EP, Exercise Physiologist   Supervising physician immediately available to respond to emergencies See telemetry face sheet for immediately available MD   Medication changes reported     No   Fall or balance concerns reported    No   Warm-up and Cool-down Performed as group-led instruction   Resistance Training Performed Yes   VAD Patient? No     Pain Assessment   Currently in Pain? No/denies   Pain Score 0-No pain   Multiple Pain Sites No      Capillary Blood Glucose: No results found for this or any previous visit (from the past 24 hour(s)).   Goals Met:  Independence with exercise equipment Exercise tolerated well No report of cardiac concerns or symptoms Strength training completed today  Goals Unmet:  Not Applicable  Comments: Check out 1200   Dr. Kate Sable is Medical Director for [facility] and Pulmonary Rehab.

## 2016-04-24 ENCOUNTER — Encounter: Payer: Self-pay | Admitting: Cardiology

## 2016-04-24 ENCOUNTER — Encounter (HOSPITAL_COMMUNITY)
Admission: RE | Admit: 2016-04-24 | Discharge: 2016-04-24 | Disposition: A | Discharge status: Home / Self Care | Payer: Medicare Other | Source: Ambulatory Visit | Attending: Cardiology | Admitting: Cardiology

## 2016-04-24 ENCOUNTER — Ambulatory Visit (INDEPENDENT_AMBULATORY_CARE_PROVIDER_SITE_OTHER): Payer: Medicare Other | Admitting: Cardiology

## 2016-04-24 VITALS — BP 147/81 | HR 63 | Ht 60.0 in | Wt 216.4 lb

## 2016-04-24 DIAGNOSIS — I350 Nonrheumatic aortic (valve) stenosis: Secondary | ICD-10-CM

## 2016-04-24 DIAGNOSIS — I10 Essential (primary) hypertension: Secondary | ICD-10-CM

## 2016-04-24 DIAGNOSIS — E782 Mixed hyperlipidemia: Secondary | ICD-10-CM | POA: Diagnosis not present

## 2016-04-24 DIAGNOSIS — Z952 Presence of prosthetic heart valve: Secondary | ICD-10-CM

## 2016-04-24 DIAGNOSIS — R011 Cardiac murmur, unspecified: Secondary | ICD-10-CM | POA: Diagnosis not present

## 2016-04-24 DIAGNOSIS — Z87891 Personal history of nicotine dependence: Secondary | ICD-10-CM | POA: Diagnosis not present

## 2016-04-24 MED ORDER — LISINOPRIL 10 MG PO TABS: 10.0000 mg | ORAL_TABLET | Freq: Every day | ORAL | 3 refills | Status: DC

## 2016-04-24 MED ORDER — METOPROLOL TARTRATE 25 MG PO TABS: 12.5000 mg | ORAL_TABLET | Freq: Two times a day (BID) | ORAL | 3 refills | Status: DC

## 2016-04-24 NOTE — Progress Notes (Signed)
Daily Session Note  Patient Details  Name: Angela Dawson MRN: [id number] Date of Birth: [birth year] Referring Provider:   Flowsheet Row CARDIAC REHAB PHASE II ORIENTATION from 04/13/2016 in [place]  Referring Provider  Dr. Harl Bowie      Encounter Date: 04/24/2016  Check In:     Session Check In - 04/24/16 1100      Check-In   Location [facility]   Staff Present Aundra Dubin, RN, BSN;Trenice Mesa Luther Parody, BS, EP, Exercise Physiologist   Supervising physician immediately available to respond to emergencies See telemetry face sheet for immediately available MD   Medication changes reported     No   Fall or balance concerns reported    No   Warm-up and Cool-down Performed as group-led instruction   Resistance Training Performed Yes   VAD Patient? No     Pain Assessment   Currently in Pain? No/denies   Pain Score 0-No pain   Multiple Pain Sites No      Capillary Blood Glucose: No results found for this or any previous visit (from the past 24 hour(s)).   Goals Met:  Independence with exercise equipment Exercise tolerated well No report of cardiac concerns or symptoms Strength training completed today  Goals Unmet:  Not Applicable  Comments: Check out 1200   Dr. Kate Sable is Medical Director for [place] and Pulmonary Rehab.

## 2016-04-24 NOTE — Progress Notes (Signed)
Clinical Summary Angela Dawson is a 71 y.o.female seen today for follow up of the following medical problems.   1. Aortic stenosis with bicuspid AV and ascending aortic aneurysm.  - 09/2013 echo showed LVEF 65-70%, grade I diastolic dysfunction. Bicuspid AV with moderate to severe AS (mean grad 21, dimensionless index 0.32, valve area 0.93), mild AI. Described mild aorta dilatation from limited views, ascending aorta 4.6 cm. - CTA chest 05/2014 with asscending thoracic aortic aneurysm 4.9 cm.  01/2015 CTA chest ascending aortic aneurysm 4.6 x 4.6 cm. Forllowed by Dr Angela Dawson.  -06/2014 echo LVEF 65-70%, AVA area 0.91, mean grad 32 - 09/2015 echo LVEF 60-65%, mod to severe AS mean grad 34, AVA 1 -09/2015 cath which showed no significant CAD   -she is s/p AVR with [employer] Lifesciene pericardial tissue valve 21mm and supracoronary replacement of the ascending aorta on 01/18/16 - started cardiac rehab.  - SOB and fatigue symptoms significantly  improved since surgery  2.HTN - compliant with meds  3. Hyperlipidemia - allergic to statins.   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 Though she recently retired. [place], then senior citizens trip to [place] with her friend.    Past Medical History:  Diagnosis Date  . Allergy    seasonal  . Chicken pox   . Complication of anesthesia    difficult to wake up after tubal ligations  . Dyspnea    with exertion  . Heart murmur   . History of bronchitis   . History of kidney stones    "only one"  . Hypertension   . Measles      Allergies  Allergen Reactions  . Crestor [Rosuvastatin]     Dizziness  . Lipitor [Atorvastatin]     Unable to stand or move  . Statins     Leg cramps   . Penicillins Rash    Has patient had a PCN reaction causing immediate rash, facial/tongue/throat swelling, SOB or lightheadedness with hypotension: Yes Has patient had a PCN reaction causing severe rash  involving mucus membranes or skin necrosis: No Has patient had a PCN reaction that required hospitalization No Has patient had a PCN reaction occurring within the last 10 years: No If all of the above answers are "NO", then may proceed with Cephalosporin use.      Current Outpatient Prescriptions  Medication Sig Dispense Refill  . acetaminophen (TYLENOL) 325 MG tablet Take 2 tablets (650 mg total) by mouth every 6 (six) hours as needed for mild pain.    Marland Kitchen aspirin EC 325 MG EC tablet Take 1 tablet (325 mg total) by mouth daily. 30 tablet 0  . cetirizine (ZYRTEC) 10 MG tablet TAKE 1 TABLET EVERY DAY 90 tablet 0  . lisinopril (PRINIVIL,ZESTRIL) 5 MG tablet Take 1 tablet (5 mg total) by mouth daily. 30 tablet 3  . metoprolol tartrate (LOPRESSOR) 25 MG tablet Take 0.5 tablets (12.5 mg total) by mouth 2 (two) times daily. 60 tablet 3   No current facility-administered medications for this visit.      Past Surgical History:  Procedure Laterality Date  . AORTIC VALVE REPLACEMENT N/A 01/17/2016   Procedure: AORTIC VALVE REPLACEMENT (AVR) USING EDWARDS MAGNA EASE PERICARDIAL BIOPROSTHESIS VALVE;  Surgeon: Angela Ovens, MD;  Location: [facility] OR;  Service: Open Heart Surgery;  Laterality: N/A;  . CARDIAC CATHETERIZATION N/A 09/24/2015   Procedure: Right/Left Heart Cath and Coronary Angiography;  Surgeon: Angela Coffin  Eldridge Dace, MD;  Location: [facility] INVASIVE CV LAB;  Service: Cardiovascular;  Laterality: N/A;  . COLONOSCOPY    . REPLACEMENT ASCENDING AORTA N/A 01/17/2016   Procedure: REPLACEMENT ASCENDING AORTA USING HEMASHIELD PLATINUM WOVEN DOUBLE VELOUR VASCULAR GRAFT;  Surgeon: Angela Ovens, MD;  Location: [facility] OR;  Service: Open Heart Surgery;  Laterality: N/A;  . TEE WITHOUT CARDIOVERSION N/A 01/17/2016   Procedure: TRANSESOPHAGEAL ECHOCARDIOGRAM (TEE);  Surgeon: Angela Ovens, MD;  Location: [facility] OR;  Service: Open Heart Surgery;  Laterality: N/A;  . TUBAL LIGATION        Allergies  Allergen Reactions  . Crestor [Rosuvastatin]     Dizziness  . Lipitor [Atorvastatin]     Unable to stand or move  . Statins     Leg cramps   . Penicillins Rash    Has patient had a PCN reaction causing immediate rash, facial/tongue/throat swelling, SOB or lightheadedness with hypotension: Yes Has patient had a PCN reaction causing severe rash involving mucus membranes or skin necrosis: No Has patient had a PCN reaction that required hospitalization No Has patient had a PCN reaction occurring within the last 10 years: No If all of the above answers are "NO", then may proceed with Cephalosporin use.       Family History  Problem Relation Age of Onset  . Alzheimer's disease Mother   . Heart disease Mother   . Heart attack Father 65     Social History Angela Dawson reports that she quit smoking about 42 years ago. Her smoking use included Cigarettes. She started smoking about 47 years ago. She has a 2.50 pack-year smoking history. She has never used smokeless tobacco. Angela Dawson reports that she does not drink alcohol.   Review of Systems CONSTITUTIONAL: No weight loss, fever, chills, weakness or fatigue.  HEENT: Eyes: No visual loss, blurred vision, double vision or yellow sclerae.No hearing loss, sneezing, congestion, runny nose or sore throat.  SKIN: No rash or itching.  CARDIOVASCULAR: per HPI RESPIRATORY: No shortness of breath, cough or sputum.  GASTROINTESTINAL: No anorexia, nausea, vomiting or diarrhea. No abdominal pain or blood.  GENITOURINARY: No burning on urination, no polyuria NEUROLOGICAL: No headache, dizziness, syncope, paralysis, ataxia, numbness or tingling in the extremities. No change in bowel or bladder control.  MUSCULOSKELETAL: No muscle, back pain, joint pain or stiffness.  LYMPHATICS: No enlarged nodes. No history of splenectomy.  PSYCHIATRIC: No history of depression or anxiety.  ENDOCRINOLOGIC: No reports of sweating, cold or heat  intolerance. No polyuria or polydipsia.  Marland Kitchen   Physical Examination Vitals:   04/24/16 1244  BP: (!) 147/81  Pulse: 63   Vitals:   04/24/16 1244  Weight: 216 lb 6.4 oz (98.2 kg)  Height: 5' (1.524 m)    Gen: resting comfortably, no acute distress HEENT: no scleral icterus, pupils equal round and reactive, no palptable cervical adenopathy,  CV: RRR, 2/6 systolic murmur RUSB, no jvd Resp: Clear to auscultation bilaterally GI: abdomen is soft, non-tender, non-distended, normal bowel sounds, no hepatosplenomegaly MSK: extremities are warm, no edema.  Skin: warm, no rash Neuro:  no focal deficits Psych: appropriate affect   Diagnostic Studies 09/2013 Echo Study Conclusions  - Left ventricle: The cavity size was normal. Wall thickness was increased in a pattern of mild LVH. Systolic function was vigorous. The estimated ejection fraction was in the range of 65% to 70%. Wall motion was normal; there were no regional wall motion abnormalities. Doppler parameters are consistent with abnormal left ventricular  relaxation (grade 1 diastolic dysfunction). - Aortic valve: Bicuspid; moderately calcified leaflets. Cusp separation was reduced. There was moderate to severe stenosis. There was mild regurgitation. Mean gradient (S): 21 mm Hg. LVOT/AV VTI ratio 0.32. Peak gradient (S): 40 mm Hg. Valve area (VTI): 0.93 cm^2. Valve area (Vmax): 0.96 cm^2. - Ascending aorta: The ascending aorta was moderately dilated. Approximately 46 mm. - Mitral valve: Calcified annulus. There was trivial regurgitation. - Right atrium: Central venous pressure (est): 3 mm Hg. - Atrial septum: No defect or patent foramen ovale was identified. - Tricuspid valve: There was trivial regurgitation. - Pulmonary arteries: PA peak pressure: 29 mm Hg (S). - Pericardium, extracardiac: There was no pericardial effusion.  Impressions:  - Mild LVH with LVEF 65-70%, grade 1 diastolic dysfunction. Moderate to severe  aortic stenosis as outlined with bicuspid aortic valve and mild aortic regurgitation. There is also at least moderate dilatation of the ascending aorta based on limited views. Given association of aortic aneurysmal disease with bicuspid aortic valve, would suggest further dedicated imaging of the thoracic aorta with CTA or MRA. Normal PASP 29 mmHg.   05/2014 CTA chest IMPRESSION: Maximal diameter of the ascending aorta is 4.9 cm compare with 4.8 cm on the prior study. No evidence of dissection.  There are airspace and ground-glass opacities in the left upper lobe most consistent with an inflammatory process. There is also a 2 mm nodule. If the patient is at high risk for bronchogenic carcinoma, follow-up chest CT at 1 year is recommended. If the patient is at low risk, no follow-up is needed. This recommendation follows the consensus statement: Guidelines for Management of Small Pulmonary Nodules Detected on CT Scans: A Statement from the Fleischner Society as published in Radiology 2005; 237:395-400.   ADDENDUM: The patient therefore has a thoracic aortic aneurysm that has only increased from 4.8 cm to 4.9 cm since August of last year. Ascending thoracic aortic aneurysm. Recommend semi-annual imaging followup by CTA or MRA and referral to cardiothoracic surgery if not already obtained. This recommendation follows 2010 ACCF/AHA/AATS/ACR/ASA/SCA/SCAI/SIR/STS/SVM Guidelines for the Diagnosis and Management of Patients With Thoracic Aortic Disease. Circulation. 2010; 121: Z610-R604   06/2014 Echo Study Conclusions  - Left ventricle: The cavity size was normal. Systolic function was vigorous. The estimated ejection fraction was in the range of 65% to 70%. Wall motion was normal; there were no regional wall motion abnormalities. Features are consistent with a pseudonormal left ventricular filling pattern, with concomitant abnormal relaxation and increased filling  pressure (grade 2 diastolic dysfunction). Doppler parameters are consistent with high ventricular filling pressure. Mild to moderate concentric left ventricular hypertrophy. - Aortic valve: Mildly to moderately calcified annulus. Moderately thickened, severely calcified leaflets. Cusp separation was reduced. There was moderate to severe stenosis. There was mild regurgitation. Peak velocity (S): 364 cm/s. Mean gradient: 32 mmHg. Valve area (Vmax): 0.91 cm^2. - Aorta: Ascending aortic diameter: 43 mm (S). Mild ascending aortic dilatation. - Mitral valve: Mildly to moderately calcified annulus. There was mild regurgitation.  Impressions:  - When compared to the report dated 10/02/13, the mean aortic valve gradient has increased by 11 mmHg. Moderate to severe stenosis is seen.  01/2015 CTA chest IMPRESSION: Dilatation of the thoracic aorta in its ascending component measuring 4.6 x 4.6 cm in greatest dimension. This is likely stable from the prior exam as some motion artifact was seen on the prior study.  09/2015 echo Study Conclusions  - Left ventricle: The cavity size was normal. Wall thickness was  increased  in a pattern of moderate LVH. Systolic function was  normal. The estimated ejection fraction was in the range of 60%  to 65%. Wall motion was normal; there were no regional wall  motion abnormalities. Features are consistent with a pseudonormal  left ventricular filling pattern, with concomitant abnormal  relaxation and increased filling pressure (grade 2 diastolic  dysfunction). Doppler parameters are consistent with high  ventricular filling pressure. - Aortic valve: Moderately thickened, severely calcified leaflets.  There was moderate to severe stenosis. There was mild  regurgitation. Peak velocity (S): 390 cm/s. Mean gradient (S): 34  mm Hg. Valve area (VTI): 1.01 cm^2. Valve area (Vmax): 0.89 cm^2.  Valve area (Vmean): 0.93  cm^2. - Aorta: Ascending aortic diameter: 46 mm (S). - Ascending aorta: The ascending aorta was mild to moderately  dilated. - Mitral valve: Calcified annulus. There was mild regurgitation. - Left atrium: The atrium was moderately dilated. - Right ventricle: Systolic function was mildly to moderately  reduced. - Tricuspid valve: There was mild-moderate regurgitation.  09/2015 Cath  The left ventricular systolic function is normal.  Nonobstructive CAD.  Normal PA pressures. CI 2.3; PA sat 68%.  Dilated ascending aorta.  Continue with plans for AVR and aneurysm repair     Assessment and Plan  1. Bicuspid aortic valve with aortic stenosis and aortic aneurysm - s/p tissue AVR and ascending aorta replacement - doing well postop - continue current meds. Likely change to asa 81mg  daily next visit. She reports started on 325mg  daily after valve - repeat echo to establish baseline function s/p AVR  2. HTN - increase lisinopril to 10mg  daily, check BMET and Mg in 2 weeks.   3. Hyperlipidemia - allergic to statin - LDL has been improving with dietary modification, down to 120. Will not start statin alternative at this time.    F/u 6 months    Antoine PocheJonathan F. Shinita Mac, [name]

## 2016-04-24 NOTE — Patient Instructions (Signed)
Your physician wants you to follow-up in: 6 MONTHS WITH DR. BRANCH You will receive a reminder letter in the mail two months in advance. If you don't receive a letter, please call our office to schedule the follow-up appointment.   Your physician has recommended you make the following change in your medication:   INCREASE LISINOPRIL 10 MG DAILY  Your physician recommends that you return for lab work in: 2 WEEKS BMP/MG  Your physician has requested that you have an echocardiogram. Echocardiography is a painless test that uses sound waves to create images of your heart. It provides your doctor with information about the size and shape of your heart and how well your heart's chambers and valves are working. This procedure takes approximately one hour. There are no restrictions for this procedure.  Thank you for choosing [facility]!!

## 2016-04-26 ENCOUNTER — Encounter (HOSPITAL_COMMUNITY)
Admission: RE | Admit: 2016-04-26 | Discharge: 2016-04-26 | Disposition: A | Discharge status: Home / Self Care | Payer: Medicare Other | Source: Ambulatory Visit | Attending: Cardiology | Admitting: Cardiology

## 2016-04-26 DIAGNOSIS — Z87891 Personal history of nicotine dependence: Secondary | ICD-10-CM | POA: Diagnosis not present

## 2016-04-26 DIAGNOSIS — R011 Cardiac murmur, unspecified: Secondary | ICD-10-CM | POA: Diagnosis not present

## 2016-04-26 DIAGNOSIS — I10 Essential (primary) hypertension: Secondary | ICD-10-CM | POA: Diagnosis not present

## 2016-04-26 DIAGNOSIS — Z952 Presence of prosthetic heart valve: Secondary | ICD-10-CM

## 2016-04-26 NOTE — Progress Notes (Signed)
Daily Session Note  Patient Details  Name: Angela Dawson MRN: [id number] Date of Birth: 24-Aug-1945 Referring Provider:   Flowsheet Row CARDIAC REHAB PHASE II ORIENTATION from 04/13/2016 in [place]  Referring Provider  Dr. Harl Bowie      Encounter Date: 04/26/2016  Check In:     Session Check In - 04/26/16 1100      Check-In   Location [facility]   Staff Present Suzanne Boron, BS, EP, Exercise Physiologist;Debra Wynetta Emery, RN, BSN   Supervising physician immediately available to respond to emergencies See telemetry face sheet for immediately available MD   Medication changes reported     Yes   Comments Lisenopril increased from 5 to '10mg'$    Fall or balance concerns reported    No   Warm-up and Cool-down Performed as group-led instruction   Resistance Training Performed Yes   VAD Patient? No     Pain Assessment   Currently in Pain? No/denies   Pain Score 0-No pain   Multiple Pain Sites No      Capillary Blood Glucose: No results found for this or any previous visit (from the past 24 hour(s)).   Goals Met:  Independence with exercise equipment Exercise tolerated well No report of cardiac concerns or symptoms Strength training completed today  Goals Unmet:  Not Applicable  Comments: Check out 1200   Dr. Kate Sable is Medical Director for [place] and Pulmonary Rehab.

## 2016-04-28 ENCOUNTER — Encounter (HOSPITAL_COMMUNITY)
Admission: RE | Admit: 2016-04-28 | Discharge: 2016-04-28 | Disposition: A | Discharge status: Home / Self Care | Payer: Medicare Other | Source: Ambulatory Visit | Attending: Cardiology | Admitting: Cardiology

## 2016-04-28 DIAGNOSIS — Z952 Presence of prosthetic heart valve: Secondary | ICD-10-CM

## 2016-04-28 DIAGNOSIS — Z87891 Personal history of nicotine dependence: Secondary | ICD-10-CM | POA: Diagnosis not present

## 2016-04-28 DIAGNOSIS — R011 Cardiac murmur, unspecified: Secondary | ICD-10-CM | POA: Diagnosis not present

## 2016-04-28 DIAGNOSIS — I10 Essential (primary) hypertension: Secondary | ICD-10-CM | POA: Diagnosis not present

## 2016-04-28 NOTE — Progress Notes (Signed)
Daily Session Note  Patient Details  Name: Angela Dawson MRN: [id number] Date of Birth: 1946-01-02 Referring Provider:   Flowsheet Row CARDIAC REHAB PHASE II ORIENTATION from 04/13/2016 in [place]  Referring Provider  Dr. Harl Bowie      Encounter Date: 04/28/2016  Check In:     Session Check In - 04/28/16 1100      Check-In   Location [facility]   Staff Present Aundra Dubin, RN, BSN;Jozee Hammer Luther Parody, BS, EP, Exercise Physiologist   Supervising physician immediately available to respond to emergencies See telemetry face sheet for immediately available MD   Medication changes reported     No   Fall or balance concerns reported    No   Warm-up and Cool-down Performed as group-led instruction   Resistance Training Performed Yes   VAD Patient? No     Pain Assessment   Currently in Pain? No/denies   Pain Score 0-No pain   Multiple Pain Sites No      Capillary Blood Glucose: No results found for this or any previous visit (from the past 24 hour(s)).   Goals Met:  Independence with exercise equipment Exercise tolerated well No report of cardiac concerns or symptoms Strength training completed today  Goals Unmet:  Not Applicable  Comments: Check out 1200   Dr. Kate Sable is Medical Director for [place] and Pulmonary Rehab.

## 2016-05-01 ENCOUNTER — Encounter (HOSPITAL_COMMUNITY)
Admission: RE | Admit: 2016-05-01 | Discharge: 2016-05-01 | Disposition: A | Discharge status: Home / Self Care | Payer: Medicare Other | Source: Ambulatory Visit | Attending: Cardiology | Admitting: Cardiology

## 2016-05-01 DIAGNOSIS — Z952 Presence of prosthetic heart valve: Secondary | ICD-10-CM

## 2016-05-01 DIAGNOSIS — Z87891 Personal history of nicotine dependence: Secondary | ICD-10-CM | POA: Diagnosis not present

## 2016-05-01 DIAGNOSIS — R011 Cardiac murmur, unspecified: Secondary | ICD-10-CM | POA: Diagnosis not present

## 2016-05-01 DIAGNOSIS — I10 Essential (primary) hypertension: Secondary | ICD-10-CM | POA: Diagnosis not present

## 2016-05-01 NOTE — Progress Notes (Signed)
Daily Session Note  Patient Details  Name: Angela Dawson MRN: [id number] Date of Birth: 07/25/1945 Referring Provider:   Flowsheet Row CARDIAC REHAB PHASE II ORIENTATION from 04/13/2016 in [place]  Referring Provider  Dr. Harl Bowie      Encounter Date: 05/01/2016  Check In:     Session Check In - 05/01/16 1100      Check-In   Location [facility]   Staff Present Aundra Dubin, RN, BSN;Kathi Dohn Luther Parody, BS, EP, Exercise Physiologist   Supervising physician immediately available to respond to emergencies See telemetry face sheet for immediately available MD   Medication changes reported     No   Fall or balance concerns reported    No   Warm-up and Cool-down Performed as group-led instruction   Resistance Training Performed Yes   VAD Patient? No     Pain Assessment   Currently in Pain? No/denies   Pain Score 0-No pain   Multiple Pain Sites No      Capillary Blood Glucose: No results found for this or any previous visit (from the past 24 hour(s)).    History  Smoking Status  . Former Smoker  . Packs/day: 0.50  . Years: 5.00  . Types: Cigarettes  . Start date: 08/04/1968  . Quit date: 10/10/1973  Smokeless Tobacco  . Never Used    Comment: smoked less than 1 pack every 2 weeks. Havent smoked in atleast 40 years    Goals Met:  Independence with exercise equipment Exercise tolerated well No report of cardiac concerns or symptoms Strength training completed today  Goals Unmet:  Not Applicable  Comments: Check out 1200   Dr. Kate Sable is Medical Director for [place] and Pulmonary Rehab.

## 2016-05-03 ENCOUNTER — Other Ambulatory Visit: Payer: Self-pay | Admitting: Nurse Practitioner

## 2016-05-03 ENCOUNTER — Encounter (HOSPITAL_COMMUNITY)
Admission: RE | Admit: 2016-05-03 | Discharge: 2016-05-03 | Disposition: A | Discharge status: Home / Self Care | Payer: Medicare Other | Source: Ambulatory Visit | Attending: Cardiology | Admitting: Cardiology

## 2016-05-03 DIAGNOSIS — Z952 Presence of prosthetic heart valve: Secondary | ICD-10-CM

## 2016-05-03 DIAGNOSIS — I10 Essential (primary) hypertension: Secondary | ICD-10-CM | POA: Diagnosis not present

## 2016-05-03 DIAGNOSIS — R011 Cardiac murmur, unspecified: Secondary | ICD-10-CM | POA: Diagnosis not present

## 2016-05-03 DIAGNOSIS — Z87891 Personal history of nicotine dependence: Secondary | ICD-10-CM | POA: Diagnosis not present

## 2016-05-03 NOTE — Progress Notes (Signed)
Daily Session Note  Patient Details  Name: Angela Dawson MRN: [id number] Date of Birth: April 17, 1945 Referring Provider:   Flowsheet Row CARDIAC REHAB PHASE II ORIENTATION from 04/13/2016 in [place]  Referring Provider  Dr. Harl Bowie      Encounter Date: 05/03/2016  Check In:     Session Check In - 05/03/16 1100      Check-In   Location [facility]   Staff Present Suzanne Boron, BS, EP, Exercise Physiologist;Debra Wynetta Emery, RN, BSN   Supervising physician immediately available to respond to emergencies See telemetry face sheet for immediately available MD   Medication changes reported     No   Fall or balance concerns reported    No   Warm-up and Cool-down Performed as group-led instruction   Resistance Training Performed Yes   VAD Patient? No     Pain Assessment   Currently in Pain? No/denies   Pain Score 0-No pain   Multiple Pain Sites No      Capillary Blood Glucose: No results found for this or any previous visit (from the past 24 hour(s)).    History  Smoking Status  . Former Smoker  . Packs/day: 0.50  . Years: 5.00  . Types: Cigarettes  . Start date: 08/04/1968  . Quit date: 10/10/1973  Smokeless Tobacco  . Never Used    Comment: smoked less than 1 pack every 2 weeks. Havent smoked in atleast 40 years    Goals Met:  Independence with exercise equipment Exercise tolerated well No report of cardiac concerns or symptoms Strength training completed today  Goals Unmet:  Not Applicable  Comments: Check out 1200   Dr. Kate Sable is Medical Director for [place] and Pulmonary Rehab.

## 2016-05-05 ENCOUNTER — Encounter (HOSPITAL_COMMUNITY): Payer: Medicare Other

## 2016-05-08 ENCOUNTER — Encounter (HOSPITAL_COMMUNITY)
Admission: RE | Admit: 2016-05-08 | Discharge: 2016-05-08 | Disposition: A | Discharge status: Home / Self Care | Payer: Medicare Other | Source: Ambulatory Visit | Attending: Cardiology | Admitting: Cardiology

## 2016-05-08 ENCOUNTER — Other Ambulatory Visit (HOSPITAL_COMMUNITY)
Admission: RE | Admit: 2016-05-08 | Discharge: 2016-05-08 | Disposition: A | Discharge status: Home / Self Care | Payer: Medicare Other | Source: Ambulatory Visit | Attending: Cardiology | Admitting: Cardiology

## 2016-05-08 DIAGNOSIS — I10 Essential (primary) hypertension: Secondary | ICD-10-CM | POA: Insufficient documentation

## 2016-05-08 DIAGNOSIS — R011 Cardiac murmur, unspecified: Secondary | ICD-10-CM | POA: Insufficient documentation

## 2016-05-08 DIAGNOSIS — Z952 Presence of prosthetic heart valve: Secondary | ICD-10-CM | POA: Insufficient documentation

## 2016-05-08 DIAGNOSIS — Z87891 Personal history of nicotine dependence: Secondary | ICD-10-CM | POA: Insufficient documentation

## 2016-05-08 LAB — BASIC METABOLIC PANEL
Anion gap: 7 (ref 5–15)
BUN: 18 mg/dL (ref 6–20)
CHLORIDE: 102 mmol/L (ref 101–111)
CO2: 27 mmol/L (ref 22–32)
Calcium: 8.9 mg/dL (ref 8.9–10.3)
Creatinine, Ser: 0.71 mg/dL (ref 0.44–1.00)
GFR calc Af Amer: >60 mL/min (ref >60)
GFR calc non Af Amer: >60 mL/min (ref >60)
Glucose, Bld: 86 mg/dL (ref 65–99)
POTASSIUM: 3.6 mmol/L (ref 3.5–5.1)
SODIUM: 136 mmol/L (ref 135–145)

## 2016-05-08 LAB — MAGNESIUM: Magnesium: 1.7 mg/dL (ref 1.7–2.4)

## 2016-05-08 NOTE — Progress Notes (Signed)
Daily Session Note  Patient Details  Name: Angela Dawson MRN: [id number] Date of Birth: 01-11-1946 Referring Provider:   Flowsheet Row CARDIAC REHAB PHASE II ORIENTATION from 04/13/2016 in [place]  Referring Provider  Dr. Harl Bowie      Encounter Date: 05/08/2016  Check In:     Session Check In - 05/08/16 1100      Check-In   Location [facility]   Staff Present Aundra Dubin, RN, BSN;Marvene Strohm Luther Parody, BS, EP, Exercise Physiologist   Supervising physician immediately available to respond to emergencies See telemetry face sheet for immediately available MD   Medication changes reported     No   Fall or balance concerns reported    No   Warm-up and Cool-down Performed as group-led instruction   Resistance Training Performed Yes   VAD Patient? No     Pain Assessment   Currently in Pain? No/denies   Pain Score 0-No pain   Multiple Pain Sites No      Capillary Blood Glucose: No results found for this or any previous visit (from the past 24 hour(s)).      Exercise Prescription Changes - 05/08/16 0800      Response to Exercise   Blood Pressure (Admit) 168/78   Blood Pressure (Exercise) 184/80   Blood Pressure (Exit) 138/78   Heart Rate (Admit) 62 bpm   Heart Rate (Exercise) 99 bpm   Heart Rate (Exit) 71 bpm   Rating of Perceived Exertion (Exercise) 11   Duration Progress to 30 minutes of  aerobic without signs/symptoms of physical distress   Intensity THRR unchanged     Resistance Training   Training Prescription Yes   Weight 2   Reps 10-15     NuStep   Level 2   SPM 21   Minutes 20   METs 3.65     Arm Ergometer   Level 2.5   Watts 31   Minutes 15   METs 3.4     Home Exercise Plan   Plans to continue exercise at Home (comment)   Frequency Add 2 additional days to program exercise sessions.      History  Smoking Status  . Former Smoker  . Packs/day: 0.50  . Years: 5.00  . Types: Cigarettes  . Start date:  08/04/1968  . Quit date: 10/10/1973  Smokeless Tobacco  . Never Used    Comment: smoked less than 1 pack every 2 weeks. Havent smoked in atleast 40 years    Goals Met:  Independence with exercise equipment Exercise tolerated well No report of cardiac concerns or symptoms Strength training completed today  Goals Unmet:  Not Applicable  Comments: Check out 1200   Dr. Kate Sable is Medical Director for [place] and Pulmonary Rehab.

## 2016-05-09 NOTE — Progress Notes (Signed)
Cardiac Individual Treatment Plan  Patient Details  Name: Angela Dawson MRN: [id number] Date of Birth: 1945-05-17 Referring Provider:   Flowsheet Row CARDIAC REHAB PHASE II ORIENTATION from 04/13/2016 in [place]  Referring Provider  Dr. Harl Bowie      Initial Encounter Date:  Flowsheet Row CARDIAC REHAB PHASE II ORIENTATION from 04/13/2016 in [place]  Date  04/13/16  Referring Provider  Dr. Harl Bowie      Visit Diagnosis: S/P AVR (aortic valve replacement)  Patient's Home Medications on Admission:  Current Outpatient Prescriptions:  .  acetaminophen (TYLENOL) 325 MG tablet, Take 2 tablets (650 mg total) by mouth every 6 (six) hours as needed for mild pain., Disp: , Rfl:  .  aspirin EC 325 MG EC tablet, Take 1 tablet (325 mg total) by mouth daily., Disp: 30 tablet, Rfl: 0 .  cetirizine (ZYRTEC) 10 MG tablet, TAKE 1 TABLET EVERY DAY, Disp: 90 tablet, Rfl: 0 .  lisinopril (PRINIVIL,ZESTRIL) 10 MG tablet, Take 1 tablet (10 mg total) by mouth daily., Disp: 90 tablet, Rfl: 3 .  metoprolol tartrate (LOPRESSOR) 25 MG tablet, Take 0.5 tablets (12.5 mg total) by mouth 2 (two) times daily., Disp: 90 tablet, Rfl: 3  Past Medical History: Past Medical History:  Diagnosis Date  . Allergy    seasonal  . Chicken pox   . Complication of anesthesia    difficult to wake up after tubal ligations  . Dyspnea    with exertion  . Heart murmur   . History of bronchitis   . History of kidney stones    "only one"  . Hypertension   . Measles     Tobacco Use: History  Smoking Status  . Former Smoker  . Packs/day: 0.50  . Years: 5.00  . Types: Cigarettes  . Start date: 08/04/1968  . Quit date: 10/10/1973  Smokeless Tobacco  . Never Used    Comment: smoked less than 1 pack every 2 weeks. Havent smoked in atleast 40 years    Labs: Recent Review Flowsheet Data    Labs for ITP Cardiac and Pulmonary Rehab Latest Ref Rng & Units 01/18/2016 01/18/2016  01/18/2016 01/18/2016 02/29/2016   Cholestrol 100 - 199 mg/dL - - - - 188   LDLCALC 0 - 99 mg/dL - - - - 120(H)   HDL >39 mg/dL - - - - 43   Trlycerides 0 - 149 mg/dL - - - - 127   Hemoglobin A1c 4.8 - 5.6 % - - - - -   PHART 7.350 - 7.450 7.495(H) 7.375 7.394 - -   PCO2ART 32.0 - 48.0 mmHg 33.2 40.7 38.6 - -   HCO3 20.0 - 28.0 mmol/L 25.4 23.7 23.5 - -   TCO2 0 - 100 mmol/L _0 -   ACIDBASEDEF 0.0 - 2.0 mmol/L - 1.0 1.0 - -   O2SAT % 99.0 97.0 96.0 - -      Capillary Blood Glucose: Lab Results  Component Value Date   GLUCAP 100 (H) 01/22/2016   GLUCAP 109 (H) 01/22/2016   GLUCAP 103 (H) 01/22/2016   GLUCAP 101 (H) 01/21/2016   GLUCAP 115 (H) 01/21/2016     Exercise Target Goals:    Exercise Program Goal: Individual exercise prescription set with THRR, safety & activity barriers. Participant demonstrates ability to understand and report RPE using BORG scale, to self-measure pulse accurately, and to acknowledge the importance of the exercise prescription.  Exercise Prescription Goal: Starting with aerobic  activity 30 plus minutes a day, 3 days per week for initial exercise prescription. Provide home exercise prescription and guidelines that participant acknowledges understanding prior to discharge.  Activity Barriers & Risk Stratification:     Activity Barriers & Cardiac Risk Stratification - 04/13/16 1448      Activity Barriers & Cardiac Risk Stratification   Activity Barriers None   Cardiac Risk Stratification High      6 Minute Walk:     6 Minute Walk    Row Name 04/13/16 1443         6 Minute Walk   Phase Initial     Distance 1500 feet     Distance % Change 0 %     Walk Time 6 minutes     # of Rest Breaks 0     MPH 2.84     METS 3.17     RPE 13     Perceived Dyspnea  14     VO2 Peak 11.78     Symptoms No     Resting HR 74 bpm     Resting BP 158/80     Max Ex. HR 124 bpm     Max Ex. BP 198/100     2 Minute Post BP 162/92         Oxygen Initial Assessment:   Oxygen Re-Evaluation:   Oxygen Discharge (Final Oxygen Re-Evaluation):   Initial Exercise Prescription:     Initial Exercise Prescription - 04/13/16 1200      Date of Initial Exercise RX and Referring Provider   Date 04/13/16   Referring Provider Dr. Harl Bowie     NuStep   Level 2   SPM 15   Minutes 15   METs 1.9     Arm Ergometer   Level 2   Watts 20   Minutes 20   METs 2.8     Prescription Details   Frequency (times per week) 3   Duration Progress to 30 minutes of continuous aerobic without signs/symptoms of physical distress     Intensity   THRR REST +  30   THRR 40-80% of Max Heartrate 105-120-136   Ratings of Perceived Exertion 11-13   Perceived Dyspnea 0-4     Progression   Progression Continue progressive overload as per policy without signs/symptoms or physical distress.     Resistance Training   Training Prescription Yes   Weight 1   Reps 10-12      Perform Capillary Blood Glucose checks as needed.  Exercise Prescription Changes:      Exercise Prescription Changes    Row Name 05/08/16 0800             Response to Exercise   Blood Pressure (Admit) 168/78       Blood Pressure (Exercise) 184/80       Blood Pressure (Exit) 138/78       Heart Rate (Admit) 62 bpm       Heart Rate (Exercise) 99 bpm       Heart Rate (Exit) 71 bpm       Rating of Perceived Exertion (Exercise) 11       Duration Progress to 30 minutes of  aerobic without signs/symptoms of physical distress       Intensity THRR unchanged         Resistance Training   Training Prescription Yes       Weight 2       Reps 10-15  NuStep   Level 2       SPM 21       Minutes 20       METs 3.65         Arm Ergometer   Level 2.5       Watts 31       Minutes 15       METs 3.4         Home Exercise Plan   Plans to continue exercise at Home (comment)       Frequency Add 2 additional days to program exercise sessions.           Exercise Comments:      Exercise Comments    Row Name 05/08/16 0840           Exercise Comments Patient is progresssing well and it doing exceptional on the Arm ergometer          Exercise Goals and Review:   Exercise Goals Re-Evaluation :     Exercise Goals Re-Evaluation    [place] Name 05/09/16 0740             Exercise Goal Re-Evaluation   Exercise Goals Review Increase Strenth and Stamina;Increase Physical Activity       Comments Patient says her strength and stamina have increased. She is able to do more around the house with less fatigue.        Expected Outcomes Patient will continue to progress in the program.            Discharge Exercise Prescription (Final Exercise Prescription Changes):     Exercise Prescription Changes - 05/08/16 0800      Response to Exercise   Blood Pressure (Admit) 168/78   Blood Pressure (Exercise) 184/80   Blood Pressure (Exit) 138/78   Heart Rate (Admit) 62 bpm   Heart Rate (Exercise) 99 bpm   Heart Rate (Exit) 71 bpm   Rating of Perceived Exertion (Exercise) 11   Duration Progress to 30 minutes of  aerobic without signs/symptoms of physical distress   Intensity THRR unchanged     Resistance Training   Training Prescription Yes   Weight 2   Reps 10-15     NuStep   Level 2   SPM 21   Minutes 20   METs 3.65     Arm Ergometer   Level 2.5   Watts 31   Minutes 15   METs 3.4     Home Exercise Plan   Plans to continue exercise at Home (comment)   Frequency Add 2 additional days to program exercise sessions.      Nutrition:  Target Goals: Understanding of nutrition guidelines, daily intake of sodium <1567m, cholesterol <2011m calories 30% from fat and 7% or less from saturated fats, daily to have 5 or more servings of fruits and vegetables.  Biometrics:     Pre Biometrics - 04/13/16 1253      Pre Biometrics   Height 5' (1.524 m)   Weight 212 lb 1.3 oz (96.2 kg)   Waist Circumference 41 inches   Hip  Circumference 47 inches   Waist to Hip Ratio 0.87 %   BMI (Calculated) 41.5   Triceps Skinfold 30 mm   % Body Fat 50 %   Grip Strength 50 kg   Flexibility 0 in   Single Leg Stand 5 seconds       Nutrition Therapy Plan and Nutrition Goals:   Nutrition Discharge: Rate Your Plate Scores:  Nutrition Assessments - 04/13/16 1450      MEDFICTS Scores   Pre Score 30      Nutrition Goals Re-Evaluation:   Nutrition Goals Discharge (Final Nutrition Goals Re-Evaluation):   Psychosocial: Target Goals: Acknowledge presence or absence of significant depression and/or stress, maximize coping skills, provide positive support system. Participant is able to verbalize types and ability to use techniques and skills needed for reducing stress and depression.  Initial Review & Psychosocial Screening:     Initial Psych Review & Screening - 04/13/16 [place]? Yes     Barriers   Psychosocial barriers to participate in program There are no identifiable barriers or psychosocial needs.     Screening Interventions   Interventions Encouraged to exercise      Quality of Life Scores:     Quality of Life - 04/13/16 1253      Quality of Life Scores   Health/Function Pre 29.54 %   Socioeconomic Pre 29.14 %   Psych/Spiritual Pre 29.14 %   Family Pre 30 %   GLOBAL Pre 29.42 %      PHQ-9: Recent Review Flowsheet Data    Depression screen [facility] 2/9 04/13/2016 02/29/2016 11/30/2015 10/11/2015 08/30/2015   Decreased Interest 0 0 0 0 0   Down, Depressed, Hopeless 0 0 0 0 0   PHQ - 2 Score 0 0 0 0 0   Altered sleeping 0 - - - -   Tired, decreased energy 0 - - - -   Change in appetite 0 - - - -   Feeling bad or failure about yourself  0 - - - -   Trouble concentrating 0 - - - -   Moving slowly or fidgety/restless 0 - - - -   Suicidal thoughts 0 - - - -   PHQ-9 Score 0 - - - -     Interpretation of Total Score  Total Score Depression Severity:   1-4 = Minimal depression, 5-9 = Mild depression, 10-14 = Moderate depression, 15-19 = Moderately severe depression, 20-27 = Severe depression   Psychosocial Evaluation and Intervention:     Psychosocial Evaluation - 04/13/16 1456      Psychosocial Evaluation & Interventions   Interventions Encouraged to exercise with the program and follow exercise prescription   Comments Patient has no psychosocial barriers identified at orientation.    Continue Psychosocial Services  No      Psychosocial Re-Evaluation:     Psychosocial Re-Evaluation    [place] Name 05/09/16 [phone]             Psychosocial Re-Evaluation   Current issues with None Identified       Comments Patient's QOL score was 29.42 and her PHQ-9 score was 0.       Continue Psychosocial Services  No Follow up required          Psychosocial Discharge (Final Psychosocial Re-Evaluation):     Psychosocial Re-Evaluation - 05/09/16 0741      Psychosocial Re-Evaluation   Current issues with None Identified   Comments Patient's QOL score was 29.42 and her PHQ-9 score was 0.   Continue Psychosocial Services  No Follow up required      Vocational Rehabilitation: Provide vocational rehab assistance to qualifying candidates.   Vocational Rehab Evaluation & Intervention:     Vocational Rehab - 04/13/16 1450      Initial Vocational Rehab Evaluation & Intervention   Assessment shows  need for Vocational Rehabilitation No      Education: Education Goals: Education classes will be provided on a weekly basis, covering required topics. Participant will state understanding/return demonstration of topics presented.  Learning Barriers/Preferences:     Learning Barriers/Preferences - 04/13/16 1449      Learning Barriers/Preferences   Learning Barriers None   Learning Preferences Skilled Demonstration      Education Topics: Hypertension, Hypertension Reduction -Define heart disease and high blood pressure. Discus how  high blood pressure affects the body and ways to reduce high blood pressure.   Exercise and Your Heart -Discuss why it is important to exercise, the FITT principles of exercise, normal and abnormal responses to exercise, and how to exercise safely.   Angina -Discuss definition of angina, causes of angina, treatment of angina, and how to decrease risk of having angina. Flowsheet Row CARDIAC REHAB PHASE II EXERCISE from 05/03/2016 in [place]  Date  04/19/16  Educator  DC  Instruction Review Code  2- meets goals/outcomes      Cardiac Medications -Review what the following cardiac medications are used for, how they affect the body, and side effects that may occur when taking the medications.  Medications include Aspirin, Beta blockers, calcium channel blockers, ACE Inhibitors, angiotensin receptor blockers, diuretics, digoxin, and antihyperlipidemics. Flowsheet Row CARDIAC REHAB PHASE II EXERCISE from 05/03/2016 in [place]  Date  04/26/16  Educator  DC  Instruction Review Code  2- meets goals/outcomes      Congestive Heart Failure -Discuss the definition of CHF, how to live with CHF, the signs and symptoms of CHF, and how keep track of weight and sodium intake. Flowsheet Row CARDIAC REHAB PHASE II EXERCISE from 05/03/2016 in [place]  Date  05/03/16  Educator  DC  Instruction Review Code  2- meets goals/outcomes      Heart Disease and Intimacy -Discus the effect sexual activity has on the heart, how changes occur during intimacy as we age, and safety during sexual activity.   Smoking Cessation / COPD -Discuss different methods to quit smoking, the health benefits of quitting smoking, and the definition of COPD.   Nutrition I: Fats -Discuss the types of cholesterol, what cholesterol does to the heart, and how cholesterol levels can be controlled.   Nutrition II: Labels -Discuss the different components  of food labels and how to read food label   Heart Parts and Heart Disease -Discuss the anatomy of the heart, the pathway of blood circulation through the heart, and these are affected by heart disease.   Stress I: Signs and Symptoms -Discuss the causes of stress, how stress may lead to anxiety and depression, and ways to limit stress.   Stress II: Relaxation -Discuss different types of relaxation techniques to limit stress.   Warning Signs of Stroke / TIA -Discuss definition of a stroke, what the signs and symptoms are of a stroke, and how to identify when someone is having stroke.   Knowledge Questionnaire Score:     Knowledge Questionnaire Score - 04/13/16 1449      Knowledge Questionnaire Score   Pre Score 21/24      Core Components/Risk Factors/Patient Goals at Admission:     Personal Goals and Risk Factors at Admission - 04/13/16 1451      Core Components/Risk Factors/Patient Goals on Admission    Weight Management Obesity;Yes   Admit Weight 212 lb (96.2 kg)   Goal Weight: Short Term 202 lb (91.6 kg)  Goal Weight: Long Term 187 lb (84.8 kg)   Expected Outcomes Short Term: Continue to assess and modify interventions until short term weight is achieved   Increase Strength and Stamina Yes   Intervention Provide advice, education, support and counseling about physical activity/exercise needs.;Develop an individualized exercise prescription for aerobic and resistive training based on initial evaluation findings, risk stratification, comorbidities and participant's personal goals.   Expected Outcomes Achievement of increased cardiorespiratory fitness and enhanced flexibility, muscular endurance and strength shown through measurements of functional capacity and personal statement of participant.   Personal Goal Other Yes   Personal Goal Lose 25 lbs at graduation. Lose 2 lbs/week. Get stronger; be able to walk more.   Intervention Patient will attend cardiac rehab 3  days/week and supplement with exercise at home 2 days/week.    Expected Outcomes Patient will meet his personal goal.       Core Components/Risk Factors/Patient Goals Review:      Goals and Risk Factor Review    Row Name 04/13/16 1455 05/09/16 0737           Core Components/Risk Factors/Patient Goals Review   Personal Goals Review Weight Management/Obesity;Increase Strength and Stamina Weight Management/Obesity;Hypertension  Get stronger; lose 25 lbs; get back to walking again.       Review  - Patient has completed 9 sessions. She has lost .2 lbs since starting the program. Her b/p continues to be hypertensive. She says she feels better since starting the program. Will continue to monitor for progress.       Expected Outcomes  - Patient will complete the program meeting her personal goals.          Core Components/Risk Factors/Patient Goals at Discharge (Final Review):      Goals and Risk Factor Review - 05/09/16 0737      Core Components/Risk Factors/Patient Goals Review   Personal Goals Review Weight Management/Obesity;Hypertension  Get stronger; lose 25 lbs; get back to walking again.    Review Patient has completed 9 sessions. She has lost .2 lbs since starting the program. Her b/p continues to be hypertensive. She says she feels better since starting the program. Will continue to monitor for progress.    Expected Outcomes Patient will complete the program meeting her personal goals.       ITP Comments:     ITP Comments    Row Name 04/13/16 1534 05/08/16 1504         ITP Comments Patient new to program. Plans to start 04/17/16. Patient met with Registered Dietitian to discuss nutrition topics including: Heart healthty eating, heart health cooking and make smart choices when shopping; Portion control; weight management; and hydration. Patient attended a group session with the hospital chaplian called [facility] to discuss and share how her recent cardiac diagnosis has  effected her life.         Comments: ITP 30 Day REVIEW Patient is doing well in the program. Will continue to monitor for progress.

## 2016-05-10 ENCOUNTER — Encounter (HOSPITAL_COMMUNITY)
Admission: RE | Admit: 2016-05-10 | Discharge: 2016-05-10 | Disposition: A | Discharge status: Home / Self Care | Payer: Medicare Other | Source: Ambulatory Visit | Attending: Cardiology | Admitting: Cardiology

## 2016-05-10 DIAGNOSIS — I10 Essential (primary) hypertension: Secondary | ICD-10-CM | POA: Diagnosis not present

## 2016-05-10 DIAGNOSIS — Z952 Presence of prosthetic heart valve: Secondary | ICD-10-CM

## 2016-05-10 DIAGNOSIS — Z87891 Personal history of nicotine dependence: Secondary | ICD-10-CM | POA: Diagnosis not present

## 2016-05-10 DIAGNOSIS — R011 Cardiac murmur, unspecified: Secondary | ICD-10-CM | POA: Diagnosis not present

## 2016-05-10 NOTE — Progress Notes (Signed)
Daily Session Note  Patient Details  Name: Angela Dawson MRN: [id number] Date of Birth: 01-25-1946 Referring Provider:   Flowsheet Row CARDIAC REHAB PHASE II ORIENTATION from 04/13/2016 in [place]  Referring Provider  Dr. Harl Bowie      Encounter Date: 05/10/2016  Check In:     Session Check In - 05/10/16 1051      Check-In   Location [facility]   Staff Present Aundra Dubin, RN, BSN;Jeananne Bedwell Luther Parody, BS, EP, Exercise Physiologist   Supervising physician immediately available to respond to emergencies See telemetry face sheet for immediately available MD   Medication changes reported     No   Fall or balance concerns reported    No   Warm-up and Cool-down Performed as group-led instruction   Resistance Training Performed Yes   VAD Patient? No     Pain Assessment   Currently in Pain? No/denies   Pain Score 0-No pain   Multiple Pain Sites No      Capillary Blood Glucose: No results found for this or any previous visit (from the past 24 hour(s)).    History  Smoking Status  . Former Smoker  . Packs/day: 0.50  . Years: 5.00  . Types: Cigarettes  . Start date: 08/04/1968  . Quit date: 10/10/1973  Smokeless Tobacco  . Never Used    Comment: smoked less than 1 pack every 2 weeks. Havent smoked in atleast 40 years    Goals Met:  Independence with exercise equipment Exercise tolerated well No report of cardiac concerns or symptoms Strength training completed today  Goals Unmet:  Not Applicable  Comments: Check out 1200   Dr. Kate Sable is Medical Director for [place] and Pulmonary Rehab.

## 2016-05-11 ENCOUNTER — Other Ambulatory Visit: Payer: Self-pay

## 2016-05-11 ENCOUNTER — Ambulatory Visit (INDEPENDENT_AMBULATORY_CARE_PROVIDER_SITE_OTHER): Payer: Medicare Other

## 2016-05-11 DIAGNOSIS — I350 Nonrheumatic aortic (valve) stenosis: Secondary | ICD-10-CM | POA: Diagnosis not present

## 2016-05-12 ENCOUNTER — Encounter (HOSPITAL_COMMUNITY)
Admission: RE | Admit: 2016-05-12 | Discharge: 2016-05-12 | Disposition: A | Discharge status: Home / Self Care | Payer: Medicare Other | Source: Ambulatory Visit | Attending: Cardiology | Admitting: Cardiology

## 2016-05-12 ENCOUNTER — Telehealth: Payer: Self-pay | Admitting: Cardiology

## 2016-05-12 DIAGNOSIS — I10 Essential (primary) hypertension: Secondary | ICD-10-CM | POA: Diagnosis not present

## 2016-05-12 DIAGNOSIS — Z952 Presence of prosthetic heart valve: Secondary | ICD-10-CM | POA: Diagnosis not present

## 2016-05-12 DIAGNOSIS — Z87891 Personal history of nicotine dependence: Secondary | ICD-10-CM | POA: Diagnosis not present

## 2016-05-12 DIAGNOSIS — R011 Cardiac murmur, unspecified: Secondary | ICD-10-CM | POA: Diagnosis not present

## 2016-05-12 NOTE — Telephone Encounter (Signed)
[facility]  Please call after 1pm today patient is going to therapy.

## 2016-05-12 NOTE — Telephone Encounter (Signed)
LM on pt VM with results as per DPR - routed to pcp

## 2016-05-12 NOTE — Telephone Encounter (Signed)
LM to return call   Labs look good, no med changes  Dominga FerryJ Branch MD

## 2016-05-12 NOTE — Progress Notes (Signed)
Daily Session Note  Patient Details  Name: Angela Dawson MRN: [id number] Date of Birth: 09/12/1945 Referring Provider:   Flowsheet Row CARDIAC REHAB PHASE II ORIENTATION from 04/13/2016 in [place]  Referring Provider  Dr. Harl Bowie      Encounter Date: 05/12/2016  Check In:     Session Check In - 05/12/16 1121      Check-In   Location [facility]   Staff Present Suzanne Boron, BS, EP, Exercise Physiologist;Diane Coad, MS, EP, [facility], Exercise Physiologist   Supervising physician immediately available to respond to emergencies See telemetry face sheet for immediately available MD   Medication changes reported     No   Fall or balance concerns reported    No   Warm-up and Cool-down Performed as group-led instruction   Resistance Training Performed Yes   VAD Patient? No     Pain Assessment   Currently in Pain? No/denies   Pain Score 0-No pain   Multiple Pain Sites No      Capillary Blood Glucose: No results found for this or any previous visit (from the past 24 hour(s)).    History  Smoking Status  . Former Smoker  . Packs/day: 0.50  . Years: 5.00  . Types: Cigarettes  . Start date: 08/04/1968  . Quit date: 10/10/1973  Smokeless Tobacco  . Never Used    Comment: smoked less than 1 pack every 2 weeks. Havent smoked in atleast 40 years    Goals Met:  Independence with exercise equipment Exercise tolerated well No report of cardiac concerns or symptoms Strength training completed today  Goals Unmet:  Not Applicable  Comments: Check out 1200   Dr. Kate Sable is Medical Director for [place] and Pulmonary Rehab.

## 2016-05-15 ENCOUNTER — Encounter (HOSPITAL_COMMUNITY): Payer: Medicare Other

## 2016-05-16 ENCOUNTER — Telehealth: Payer: Self-pay | Admitting: *Deleted

## 2016-05-16 NOTE — Telephone Encounter (Signed)
-----   Message from Antoine PocheJonathan F Branch, MD sent at 05/16/2016 11:38 AM EDT ----- Echo looks good, normal functioning artificial valve  Dominga FerryJ Branch MD

## 2016-05-16 NOTE — Telephone Encounter (Signed)
Pt aware - routed to pcp  

## 2016-05-17 ENCOUNTER — Encounter (HOSPITAL_COMMUNITY)
Admission: RE | Admit: 2016-05-17 | Discharge: 2016-05-17 | Disposition: A | Discharge status: Home / Self Care | Payer: Medicare Other | Source: Ambulatory Visit | Attending: Cardiology | Admitting: Cardiology

## 2016-05-17 DIAGNOSIS — Z87891 Personal history of nicotine dependence: Secondary | ICD-10-CM | POA: Diagnosis not present

## 2016-05-17 DIAGNOSIS — I10 Essential (primary) hypertension: Secondary | ICD-10-CM | POA: Diagnosis not present

## 2016-05-17 DIAGNOSIS — Z952 Presence of prosthetic heart valve: Secondary | ICD-10-CM

## 2016-05-17 DIAGNOSIS — R011 Cardiac murmur, unspecified: Secondary | ICD-10-CM | POA: Diagnosis not present

## 2016-05-17 NOTE — Progress Notes (Signed)
Daily Session Note  Patient Details  Name: Angela Dawson MRN: [id number] Date of Birth: 1946-02-24 Referring Provider:   Flowsheet Row CARDIAC REHAB PHASE II ORIENTATION from 04/13/2016 in [place]  Referring Provider  Dr. Harl Bowie      Encounter Date: 05/17/2016  Check In:     Session Check In - 05/17/16 1100      Check-In   Location [facility]   Staff Present Diane Angelina Pih, MS, EP, [facility], Exercise Physiologist;Debra Wynetta Emery, RN, BSN;Eimi Viney, BS, EP, Exercise Physiologist   Supervising physician immediately available to respond to emergencies See telemetry face sheet for immediately available MD   Medication changes reported     No   Fall or balance concerns reported    No   Warm-up and Cool-down Performed as group-led instruction   Resistance Training Performed Yes   VAD Patient? No     Pain Assessment   Currently in Pain? No/denies   Pain Score 0-No pain   Multiple Pain Sites No      Capillary Blood Glucose: No results found for this or any previous visit (from the past 24 hour(s)).    History  Smoking Status  . Former Smoker  . Packs/day: 0.50  . Years: 5.00  . Types: Cigarettes  . Start date: 08/04/1968  . Quit date: 10/10/1973  Smokeless Tobacco  . Never Used    Comment: smoked less than 1 pack every 2 weeks. Havent smoked in atleast 40 years    Goals Met:  Independence with exercise equipment Exercise tolerated well No report of cardiac concerns or symptoms Strength training completed today  Goals Unmet:  Not Applicable  Comments: Check out 1200   Dr. Kate Sable is Medical Director for [place] and Pulmonary Rehab.

## 2016-05-18 ENCOUNTER — Encounter: Payer: Medicare Other | Admitting: Cardiothoracic Surgery

## 2016-05-19 ENCOUNTER — Encounter (HOSPITAL_COMMUNITY)
Admission: RE | Admit: 2016-05-19 | Discharge: 2016-05-19 | Disposition: A | Discharge status: Home / Self Care | Payer: Medicare Other | Source: Ambulatory Visit | Attending: Cardiology | Admitting: Cardiology

## 2016-05-19 DIAGNOSIS — Z952 Presence of prosthetic heart valve: Secondary | ICD-10-CM | POA: Diagnosis not present

## 2016-05-19 DIAGNOSIS — R011 Cardiac murmur, unspecified: Secondary | ICD-10-CM | POA: Diagnosis not present

## 2016-05-19 DIAGNOSIS — I10 Essential (primary) hypertension: Secondary | ICD-10-CM | POA: Diagnosis not present

## 2016-05-19 DIAGNOSIS — Z87891 Personal history of nicotine dependence: Secondary | ICD-10-CM | POA: Diagnosis not present

## 2016-05-19 NOTE — Progress Notes (Signed)
Daily Session Note  Patient Details  Name: Angela Dawson MRN: [id number] Date of Birth: September 21, 1945 Referring Provider:     CARDIAC REHAB PHASE II ORIENTATION from 04/13/2016 in [place]  Referring Provider  Dr. Harl Bowie      Encounter Date: 05/19/2016  Check In:     Session Check In - 05/19/16 1100      Check-In   Location [facility]   Staff Present Diane Angelina Pih, MS, EP, [facility], Exercise Physiologist;Griffen Frayne Luther Parody, BS, EP, Exercise Physiologist   Supervising physician immediately available to respond to emergencies See telemetry face sheet for immediately available MD   Medication changes reported     No   Fall or balance concerns reported    No   Warm-up and Cool-down Performed as group-led instruction   Resistance Training Performed Yes   VAD Patient? No     Pain Assessment   Currently in Pain? No/denies   Pain Score 0-No pain   Multiple Pain Sites No      Capillary Blood Glucose: No results found for this or any previous visit (from the past 24 hour(s)).    History  Smoking Status  . Former Smoker  . Packs/day: 0.50  . Years: 5.00  . Types: Cigarettes  . Start date: 08/04/1968  . Quit date: 10/10/1973  Smokeless Tobacco  . Never Used    Comment: smoked less than 1 pack every 2 weeks. Havent smoked in atleast 40 years    Goals Met:  Independence with exercise equipment Exercise tolerated well No report of cardiac concerns or symptoms Strength training completed today  Goals Unmet:  Not Applicable  Comments: Check out 1200   Dr. Kate Sable is Medical Director for [place] and Pulmonary Rehab.

## 2016-05-22 ENCOUNTER — Encounter (HOSPITAL_COMMUNITY)
Admission: RE | Admit: 2016-05-22 | Discharge: 2016-05-22 | Disposition: A | Discharge status: Home / Self Care | Payer: Medicare Other | Source: Ambulatory Visit | Attending: Cardiology | Admitting: Cardiology

## 2016-05-22 DIAGNOSIS — Z952 Presence of prosthetic heart valve: Secondary | ICD-10-CM

## 2016-05-22 DIAGNOSIS — I10 Essential (primary) hypertension: Secondary | ICD-10-CM | POA: Diagnosis not present

## 2016-05-22 DIAGNOSIS — R011 Cardiac murmur, unspecified: Secondary | ICD-10-CM | POA: Diagnosis not present

## 2016-05-22 DIAGNOSIS — Z87891 Personal history of nicotine dependence: Secondary | ICD-10-CM | POA: Diagnosis not present

## 2016-05-22 NOTE — Progress Notes (Signed)
Daily Session Note  Patient Details  Name: Angela Dawson MRN: [id number] Date of Birth: Sep 03, 1945 Referring Provider:     CARDIAC REHAB PHASE II ORIENTATION from 04/13/2016 in [place]  Referring Provider  Dr. Harl Bowie      Encounter Date: 05/22/2016  Check In:     Session Check In - 05/22/16 1100      Check-In   Location [facility]   Staff Present Diane Angelina Pih, MS, EP, [facility], Exercise Physiologist;Nevaan Bunton Luther Parody, BS, EP, Exercise Physiologist   Supervising physician immediately available to respond to emergencies See telemetry face sheet for immediately available MD   Medication changes reported     No   Fall or balance concerns reported    No   Warm-up and Cool-down Performed as group-led instruction   Resistance Training Performed Yes   VAD Patient? No     Pain Assessment   Currently in Pain? No/denies   Pain Score 0-No pain   Multiple Pain Sites No      Capillary Blood Glucose: No results found for this or any previous visit (from the past 24 hour(s)).    History  Smoking Status  . Former Smoker  . Packs/day: 0.50  . Years: 5.00  . Types: Cigarettes  . Start date: 08/04/1968  . Quit date: 10/10/1973  Smokeless Tobacco  . Never Used    Comment: smoked less than 1 pack every 2 weeks. Havent smoked in atleast 40 years    Goals Met:  Independence with exercise equipment Exercise tolerated well No report of cardiac concerns or symptoms Strength training completed today  Goals Unmet:  Not Applicable  Comments: Check out 1200   Dr. Kate Sable is Medical Director for [place] and Pulmonary Rehab.

## 2016-05-24 ENCOUNTER — Encounter (HOSPITAL_COMMUNITY): Payer: Medicare Other

## 2016-05-25 ENCOUNTER — Ambulatory Visit (INDEPENDENT_AMBULATORY_CARE_PROVIDER_SITE_OTHER): Payer: Medicare Other | Admitting: Cardiothoracic Surgery

## 2016-05-25 VITALS — BP 134/83 | HR 66 | Resp 20 | Ht 60.0 in | Wt 213.0 lb

## 2016-05-25 DIAGNOSIS — I712 Thoracic aortic aneurysm, without rupture: Secondary | ICD-10-CM | POA: Diagnosis not present

## 2016-05-25 DIAGNOSIS — Q230 Congenital stenosis of aortic valve: Secondary | ICD-10-CM | POA: Diagnosis not present

## 2016-05-25 DIAGNOSIS — I77810 Thoracic aortic ectasia: Secondary | ICD-10-CM | POA: Diagnosis not present

## 2016-05-25 DIAGNOSIS — Z952 Presence of prosthetic heart valve: Secondary | ICD-10-CM

## 2016-05-25 DIAGNOSIS — Q231 Congenital insufficiency of aortic valve: Secondary | ICD-10-CM

## 2016-05-25 DIAGNOSIS — I7121 Aneurysm of the ascending aorta, without rupture: Secondary | ICD-10-CM

## 2016-05-25 NOTE — Progress Notes (Signed)
[address]       [place] [address]             [phone]      RASHANDA MAGLOIRE [facility] Medical Record #[id number] Date of Birth: 11/22/1945  Referring: Antoine Poche, MD Primary Care: Bennie Pierini, FNP  Chief Complaint:   POST OP FOLLOW UP 01/17/2016 SURGICAL PROCEDURE:  Aortic valve replacement with a pericardial tissue valve, [employer], model 3300TFX 21 mm, serial #[id number], and supracoronary replacement of ascending aorta.   History of Present Illness:     Patient returns to the office now70 months after replacement of her aortic valve and ascending aorta. The patient notes a marked improvement in her overall physical stamina. Prior to surgery she had been severely limited by shortness of breath with minimal exertion. This is now much improved. She notes a recent echocardiogram has been done     Past Medical History:  Diagnosis Date  . Allergy    seasonal  . Chicken pox   . Complication of anesthesia    difficult to wake up after tubal ligations  . Dyspnea    with exertion  . Heart murmur   . History of bronchitis   . History of kidney stones    "only one"  . Hypertension   . Measles      History  Smoking Status  . Former Smoker  . Packs/day: 0.50  . Years: 5.00  . Types: Cigarettes  . Start date: 08/04/1968  . Quit date: 10/10/1973  Smokeless Tobacco  . Never Used    Comment: smoked less than 1 pack every 2 weeks. Havent smoked in atleast 40 years    History  Alcohol Use No     Allergies  Allergen Reactions  . Crestor [Rosuvastatin]     Dizziness  . Lipitor [Atorvastatin]     Unable to stand or move  . Statins     Leg cramps   . Penicillins Rash    Has patient had a PCN reaction causing immediate rash, facial/tongue/throat swelling, SOB or lightheadedness with hypotension: Yes Has patient had a PCN reaction causing severe rash involving mucus membranes or skin necrosis: No Has patient had a PCN  reaction that required hospitalization No Has patient had a PCN reaction occurring within the last 10 years: No If all of the above answers are "NO", then may proceed with Cephalosporin use.     Current Outpatient Prescriptions  Medication Sig Dispense Refill  . acetaminophen (TYLENOL) 325 MG tablet Take 2 tablets (650 mg total) by mouth every 6 (six) hours as needed for mild pain.    Marland Kitchen aspirin EC 325 MG EC tablet Take 1 tablet (325 mg total) by mouth daily. 30 tablet 0  . cetirizine (ZYRTEC) 10 MG tablet TAKE 1 TABLET EVERY DAY 90 tablet 0  . lisinopril (PRINIVIL,ZESTRIL) 10 MG tablet Take 1 tablet (10 mg total) by mouth daily. 90 tablet 3  . metoprolol tartrate (LOPRESSOR) 25 MG tablet Take 0.5 tablets (12.5 mg total) by mouth 2 (two) times daily. 90 tablet 3   No current facility-administered medications for this visit.        Physical Exam: BP 134/83 (BP Location: Right Arm, Cuff Size: Large)   Pulse 66   Resp 20   Ht 5' (1.524 m)   Wt 213 lb (96.6 kg)   SpO2 96% Comment: RA  BMI 41.60 kg/m   181/84 in the left arm Repeat  blood pressures left arm was 145/84, right arm was 140/82  General appearance: alert, cooperative, appears stated age and no distress Neurologic: intact Heart: regular rate and rhythm, S1, S2 normal, no murmur, click, rub or gallop Lungs: clear to auscultation bilaterally Abdomen: soft, non-tender; bowel sounds normal; no masses,  no organomegaly Extremities: extremities normal, atraumatic, no cyanosis or edema and Homans sign is negative, no sign of DVT Wound: Sternum is stable and well-healed patient has full and equal brachial and radial pulses bilaterally  Diagnostic Studies & Laboratory data:     Recent Radiology Findings:   No results found.    Recent Lab Findings: Lab Results  Component Value Date   WBC 12.1 (H) 01/20/2016   HGB 9.3 (L) 01/20/2016   HCT 27.5 (L) 01/20/2016   PLT 131 (L) 01/20/2016   GLUCOSE 86 05/08/2016   CHOL 188  02/29/2016   TRIG 127 02/29/2016   HDL 43 02/29/2016   LDLCALC 120 (H) 02/29/2016   ALT 20 02/29/2016   AST 21 02/29/2016   NA 136 05/08/2016   K 3.6 05/08/2016   CL 102 05/08/2016   CREATININE 0.71 05/08/2016   BUN 18 05/08/2016   CO2 27 05/08/2016   INR 1.73 01/17/2016   HGBA1C 5.3 01/14/2016   Echo: ------------------------------------------------------------------- Transthoracic Echocardiography  Patient:    Kurt, Azimi MR #:       [id number] Study Date: 05/11/2016 Gender:     F Age:        71 Height:     152.4 cm Weight:     98.2 kg BSA:        2.1 m^2 Pt. Status: Room:   SONOGRAPHER  [employer], RVT, RDCS  ATTENDING    Prentice Docker, MD  [facility]     Patrick Jupiter, [name]  REFERRING    Patrick Jupiter, [name]  PERFORMING   [facility] [place]  cc:  ------------------------------------------------------------------- LV EF: 60% -   65%  ------------------------------------------------------------------- History:   PMH:  Acquired from the patient and from the patient&'s chart.  Aortic valve disease. s/p aortic valve replacement with a 21 mm [facility] Ease pericardial bioprosthesis valve, and supracoronary replacement of the ascending aorta on 01/18/16.  Risk factors:  Former tobacco use. Hypertension. Dyslipidemia.  ------------------------------------------------------------------- Study Conclusions  - Procedure narrative: Transthoracic echocardiography. Image   quality was adequate. The study was technically difficult, as a   result of poor acoustic windows, poor sound wave transmission,   and body habitus. - Left ventricle: The cavity size was normal. Wall thickness was   increased in a pattern of mild LVH. Systolic function was normal.   The estimated ejection fraction was in the range of 60% to 65%.   Diastolic dysfunction, grade indeterminate. Doppler parameters   are consistent with high ventricular filling pressure. - Aortic valve:  Bioprosthetic valve [facility] Ease) by report.   Appears to be functioning normally. Peak velocity (S): 202 cm/s.   Mean gradient (S): 10 mm Hg. - Mitral valve: Calcified annulus.  ------------------------------------------------------------------- Study data:   Study status:  Routine.  Procedure:  Transthoracic echocardiography. Image quality was adequate. The study was technically difficult, as a result of poor acoustic windows, poor sound wave transmission, and body habitus.  Study completion: There were no complications.          Transthoracic echocardiography.  M-mode, complete 2D, spectral Doppler, and color Doppler.  Birthdate:  Patient birthdate: 03/13/1945.  Age:  Patient is 71 yr old.  Sex:  Gender:  female.    BMI: 42.3 kg/m^2.  Blood pressure:     148/80  Patient status:  Outpatient.  Study date: Study date: 05/11/2016. Study time: 03:26 PM.  -------------------------------------------------------------------  ------------------------------------------------------------------- Left ventricle:  The cavity size was normal. Wall thickness was increased in a pattern of mild LVH. Systolic function was normal. The estimated ejection fraction was in the range of 60% to 65%. Images were inadequate for LV wall motion assessment. Diastolic dysfunction, grade indeterminate. Doppler parameters are consistent with high ventricular filling pressure.  ------------------------------------------------------------------- Aortic valve:  Bioprosthetic valve [facility] Ease) by report. Appears to be functioning normally. Poorly visualized.  Doppler: There was no significant regurgitation.    VTI ratio of LVOT to aortic valve: 0.64. Valve area (VTI): 1.63 cm^2. Indexed valve area (VTI): 0.78 cm^2/m^2. Peak velocity ratio of LVOT to aortic valve: 0.7. Valve area (Vmax): 1.79 cm^2. Indexed valve area (Vmax): 0.85 cm^2/m^2. Mean velocity ratio of LVOT to aortic valve: 0.61.  Valve area (Vmean): 1.54 cm^2. Indexed valve area (Vmean): 0.74 cm^2/m^2.    Mean gradient (S): 10 mm Hg. Peak gradient (S): 16 mm Hg.  ------------------------------------------------------------------- Aorta:  Aortic root: The aortic root was normal in size. Ascending aorta: The ascending aorta was normal in size.  ------------------------------------------------------------------- Mitral valve:   Calcified annulus.  Doppler:  There was no significant regurgitation.    Peak gradient (D): 5 mm Hg.  ------------------------------------------------------------------- Left atrium:  The atrium was normal in size.  ------------------------------------------------------------------- Atrial septum:  No defect or patent foramen ovale was identified.   ------------------------------------------------------------------- Right ventricle:  The cavity size was normal. Wall thickness was normal. Systolic function was normal.  ------------------------------------------------------------------- Pulmonic valve:   Poorly visualized.  Doppler:  There was no significant regurgitation.  ------------------------------------------------------------------- Tricuspid valve:   The valve appears to be grossly normal. Doppler:  There was trivial regurgitation.  ------------------------------------------------------------------- Right atrium:  The atrium was normal in size.  ------------------------------------------------------------------- Pericardium:  There was no pericardial effusion.  ------------------------------------------------------------------- Systemic veins: Inferior vena cava: The vessel was normal in size. The respirophasic diameter changes were in the normal range (>= 50%), consistent with normal central venous pressure.  ------------------------------------------------------------------- Measurements   Left ventricle                           Value          Reference   LV ID, ED, PLAX chordal          (L)     37.8  mm       43 - 52  LV ID, ES, PLAX chordal          (L)     21.7  mm       23 - 38  LV fx shortening, PLAX chordal           43    %        >=29  LV PW thickness, ED                      11.9  mm       ----------  IVS/LV PW ratio, ED                      0.9            <=1.3  Stroke volume, 2D  71    ml       ----------  Stroke volume/bsa, 2D                    34    ml/m^2   ----------  LV ejection fraction, 1-p A4C            87    %        ----------  LV e&', lateral                           10.2  cm/s     ----------  LV E/e&', lateral                         10.88          ----------  LV e&', medial                            6.09  cm/s     ----------  LV E/e&', medial                          18.23          ----------  LV e&', average                           8.15  cm/s     ----------  LV E/e&', average                         13.63          ----------    Ventricular septum                       Value          Reference  IVS thickness, ED                        10.7  mm       ----------    LVOT                                     Value          Reference  LVOT ID, S                               18    mm       ----------  LVOT area                                2.54  cm^2     ----------  LVOT peak velocity, S                    142   cm/s     ----------  LVOT mean velocity, S                    88.6  cm/s     ----------  LVOT VTI, S  28    cm       ----------  LVOT peak gradient, S                    8     mm Hg    ----------    Aortic valve                             Value          Reference  Aortic valve peak velocity, S            202   cm/s     ----------  Aortic valve mean velocity, S            146   cm/s     ----------  Aortic valve VTI, S                      43.5  cm       ----------  Aortic mean gradient, S                  10    mm Hg    ----------  Aortic peak gradient,  S                  16    mm Hg    ----------  VTI ratio, LVOT/AV                       0.64           ----------  Aortic valve area, VTI                   1.63  cm^2     ----------  Aortic valve area/bsa, VTI               0.78  cm^2/m^2 ----------  Velocity ratio, peak, LVOT/AV            0.7            ----------  Aortic valve area, peak velocity         1.79  cm^2     ----------  Aortic valve area/bsa, peak              0.85  cm^2/m^2 ----------  velocity  Velocity ratio, mean, LVOT/AV            0.61           ----------  Aortic valve area, mean velocity         1.54  cm^2     ----------  Aortic valve area/bsa, mean              0.74  cm^2/m^2 ----------  velocity    Aorta                                    Value          Reference  Aortic root ID, ED                       28    mm       ----------  Ascending aorta ID, A-P, S               29    mm       ----------  Left atrium                              Value          Reference  LA ID, A-P, ES                           36    mm       ----------  LA ID/bsa, A-P                           1.72  cm/m^2   <=2.2  LA volume, S                             54.6  ml       ----------  LA volume/bsa, S                         26    ml/m^2   ----------  LA volume, ES, 1-p A4C                   49.4  ml       ----------  LA volume/bsa, ES, 1-p A4C               23.6  ml/m^2   ----------  LA volume, ES, 1-p A2C                   53.8  ml       ----------  LA volume/bsa, ES, 1-p A2C               25.7  ml/m^2   ----------    Mitral valve                             Value          Reference  Mitral E-wave peak velocity              111   cm/s     ----------  Mitral A-wave peak velocity              100   cm/s     ----------  Mitral deceleration time         (H)     296   ms       150 - 230  Mitral peak gradient, D                  5     mm Hg    ----------  Mitral E/A ratio, peak                   1.1            ----------    Right atrium                              Value          Reference  RA ID, S-I, ES, A4C              (H)     51.2  mm       34 - 49  RA area,  ES, A4C                         18.3  cm^2     8.3 - 19.5  RA volume, ES, A/L                       53.5  ml       ----------  RA volume/bsa, ES, A/L                   25.5  ml/m^2   ----------    Right ventricle                          Value          Reference  RV s&', lateral, S                        11.3  cm/s     ----------    Pulmonic valve                           Value          Reference  Pulmonic valve peak velocity, S          110   cm/s     ----------  Legend: (L)  and  (H)  mark values outside specified reference range.  ------------------------------------------------------------------- Prepared and Electronically Authenticated by  Prentice Docker, MD 2018-03-08T16:32:39  Assessment / Plan:      Patient stable after aortic valve replacement and supra coronary replacement of her ascending aorta for severe aortic stenosis and dilated ascending aorta. The patient's had great symptomatic recovery after her valve replacement, she notes that her dyspnea on exertion has almost completely resolved. She was concerned about the difference in blood pressure in her arms,  6 months postop she will have a CTA of the chest, I plan to see her back with CTA of the chest in 3-4 months   With the patient's prosthetic heart valve the risks of endocarditis have been discussed. The recommendations for periprocedural antibiotics including dental procedures have been discussed with the patient. She plans to see the dentist in several weeks.   Delight Ovens MD      [address] [place] [place] [address] Office [phone]   Beeper [phone]  05/25/2016 11:52 AM

## 2016-05-25 NOTE — Patient Instructions (Signed)

## 2016-05-26 ENCOUNTER — Encounter (HOSPITAL_COMMUNITY)
Admission: RE | Admit: 2016-05-26 | Discharge: 2016-05-26 | Disposition: A | Discharge status: Home / Self Care | Payer: Medicare Other | Source: Ambulatory Visit | Attending: Cardiology | Admitting: Cardiology

## 2016-05-26 DIAGNOSIS — Z87891 Personal history of nicotine dependence: Secondary | ICD-10-CM | POA: Diagnosis not present

## 2016-05-26 DIAGNOSIS — I10 Essential (primary) hypertension: Secondary | ICD-10-CM | POA: Diagnosis not present

## 2016-05-26 DIAGNOSIS — R011 Cardiac murmur, unspecified: Secondary | ICD-10-CM | POA: Diagnosis not present

## 2016-05-26 DIAGNOSIS — Z952 Presence of prosthetic heart valve: Secondary | ICD-10-CM

## 2016-05-26 NOTE — Progress Notes (Signed)
Daily Session Note  Patient Details  Name: Angela Dawson MRN: [id number] Date of Birth: 25-Feb-1946 Referring Provider:     CARDIAC REHAB PHASE II ORIENTATION from 04/13/2016 in [place]  Referring Provider  Dr. Harl Bowie      Encounter Date: 05/26/2016  Check In:     Session Check In - 05/26/16 1106      Check-In   Location [facility]   Staff Present Aundra Dubin, RN, BSN;Evellyn Tuff Luther Parody, BS, EP, Exercise Physiologist   Supervising physician immediately available to respond to emergencies See telemetry face sheet for immediately available MD   Medication changes reported     No   Fall or balance concerns reported    No   Warm-up and Cool-down Performed as group-led instruction   Resistance Training Performed Yes   VAD Patient? No     Pain Assessment   Currently in Pain? No/denies   Pain Score 0-No pain   Multiple Pain Sites No      Capillary Blood Glucose: No results found for this or any previous visit (from the past 24 hour(s)).    History  Smoking Status  . Former Smoker  . Packs/day: 0.50  . Years: 5.00  . Types: Cigarettes  . Start date: 08/04/1968  . Quit date: 10/10/1973  Smokeless Tobacco  . Never Used    Comment: smoked less than 1 pack every 2 weeks. Havent smoked in atleast 40 years    Goals Met:  Independence with exercise equipment Exercise tolerated well No report of cardiac concerns or symptoms Strength training completed today  Goals Unmet:  Not Applicable  Comments: Check out 1200   Dr. Kate Sable is Medical Director for [place] and Pulmonary Rehab.

## 2016-05-29 ENCOUNTER — Encounter (HOSPITAL_COMMUNITY)
Admission: RE | Admit: 2016-05-29 | Discharge: 2016-05-29 | Disposition: A | Discharge status: Home / Self Care | Payer: Medicare Other | Source: Ambulatory Visit | Attending: Cardiology | Admitting: Cardiology

## 2016-05-29 DIAGNOSIS — Z87891 Personal history of nicotine dependence: Secondary | ICD-10-CM | POA: Diagnosis not present

## 2016-05-29 DIAGNOSIS — Z952 Presence of prosthetic heart valve: Secondary | ICD-10-CM | POA: Diagnosis not present

## 2016-05-29 DIAGNOSIS — I10 Essential (primary) hypertension: Secondary | ICD-10-CM | POA: Diagnosis not present

## 2016-05-29 DIAGNOSIS — R011 Cardiac murmur, unspecified: Secondary | ICD-10-CM | POA: Diagnosis not present

## 2016-05-29 NOTE — Progress Notes (Signed)
Daily Session Note  Patient Details  Name: Angela Dawson MRN: [id number] Date of Birth: 10/13/1945 Referring Provider:     CARDIAC REHAB PHASE II ORIENTATION from 04/13/2016 in [place] CARDIAC RE[facility]ILITATION  Referring Provider  Dr. Branch      Encounter Date: 05/29/2016  Check In:     Session Check In - 05/29/16 1100      Check-In   Location [facility]   Staff Present Debra Johnson, RN, BSN;Taria Castrillo, BS, EP, Exercise Physiologist   Supervising physician immediately available to respond to emergencies See telemetry face sheet for immediately available MD   Medication changes reported     No   Fall or balance concerns reported    No   Warm-up and Cool-down Performed as group-led instruction   Resistance Training Performed Yes   VAD Patient? No     Pain Assessment   Currently in Pain? No/denies   Pain Score 0-No pain   Multiple Pain Sites No      Capillary Blood Glucose: No results found for this or any previous visit (from the past 24 hour(s)).    History  Smoking Status  . Former Smoker  . Packs/day: 0.50  . Years: 5.00  . Types: Cigarettes  . Start date: 08/04/1968  . Quit date: 10/10/1973  Smokeless Tobacco  . Never Used    Comment: smoked less than 1 pack every 2 weeks. Havent smoked in atleast 40 years    Goals Met:  Independence with exercise equipment Exercise tolerated well No report of cardiac concerns or symptoms Strength training completed today  Goals Unmet:  Not Applicable  Comments: Check out 1200   Dr. Suresh Koneswaran is Medical Director for [place] [facility]. 

## 2016-05-31 ENCOUNTER — Encounter (HOSPITAL_COMMUNITY)
Admission: RE | Admit: 2016-05-31 | Discharge: 2016-05-31 | Disposition: A | Discharge status: Home / Self Care | Payer: Medicare Other | Source: Ambulatory Visit | Attending: Cardiology | Admitting: Cardiology

## 2016-05-31 DIAGNOSIS — Z87891 Personal history of nicotine dependence: Secondary | ICD-10-CM | POA: Diagnosis not present

## 2016-05-31 DIAGNOSIS — R011 Cardiac murmur, unspecified: Secondary | ICD-10-CM | POA: Diagnosis not present

## 2016-05-31 DIAGNOSIS — I10 Essential (primary) hypertension: Secondary | ICD-10-CM | POA: Diagnosis not present

## 2016-05-31 DIAGNOSIS — Z952 Presence of prosthetic heart valve: Secondary | ICD-10-CM

## 2016-05-31 NOTE — Progress Notes (Signed)
Daily Session Note  Patient Details  Name: TALISE SLIGH MRN: [id number] Date of Birth: 09/25/1945 Referring Provider:     [place] from 04/13/2016 in [place]  Referring Provider  Dr. Harl Bowie      Encounter Date: 05/31/2016  Check In:     Session Check In - 05/31/16 1127      Check-In   Location [facility]   Staff Present Aundra Dubin, RN, BSN;Kelon Easom Luther Parody, BS, EP, Exercise Physiologist   Supervising physician immediately available to respond to emergencies See telemetry face sheet for immediately available MD   Medication changes reported     No   Fall or balance concerns reported    No   Warm-up and Cool-down Performed as group-led instruction   Resistance Training Performed Yes   VAD Patient? No     Pain Assessment   Currently in Pain? No/denies   Pain Score 0-No pain   Multiple Pain Sites No      Capillary Blood Glucose: No results found for this or any previous visit (from the past 24 hour(s)).    History  Smoking Status  . Former Smoker  . Packs/day: 0.50  . Years: 5.00  . Types: Cigarettes  . Start date: 08/04/1968  . Quit date: 10/10/1973  Smokeless Tobacco  . Never Used    Comment: smoked less than 1 pack every 2 weeks. Havent smoked in atleast 40 years    Goals Met:  Independence with exercise equipment Exercise tolerated well No report of cardiac concerns or symptoms Strength training completed today  Goals Unmet:  Not Applicable  Comments: Check out 1200   Dr. Kate Sable is Medical Director for [place] and Pulmonary Rehab.

## 2016-06-02 ENCOUNTER — Encounter (HOSPITAL_COMMUNITY)
Admission: RE | Admit: 2016-06-02 | Discharge: 2016-06-02 | Disposition: A | Discharge status: Home / Self Care | Payer: Medicare Other | Source: Ambulatory Visit | Attending: Cardiology | Admitting: Cardiology

## 2016-06-02 DIAGNOSIS — Z952 Presence of prosthetic heart valve: Secondary | ICD-10-CM | POA: Diagnosis not present

## 2016-06-02 DIAGNOSIS — R011 Cardiac murmur, unspecified: Secondary | ICD-10-CM | POA: Diagnosis not present

## 2016-06-02 DIAGNOSIS — Z87891 Personal history of nicotine dependence: Secondary | ICD-10-CM | POA: Diagnosis not present

## 2016-06-02 DIAGNOSIS — I10 Essential (primary) hypertension: Secondary | ICD-10-CM | POA: Diagnosis not present

## 2016-06-02 NOTE — Progress Notes (Signed)
Daily Session Note  Patient Details  Name: Angela Dawson MRN: [id number] Date of Birth: 1945-07-14 Referring Provider:     CARDIAC REHAB PHASE II ORIENTATION from 04/13/2016 in [place]  Referring Provider  Dr. Harl Bowie      Encounter Date: 06/02/2016  Check In:     Session Check In - 06/02/16 1100      Check-In   Location [facility]   Staff Present Aundra Dubin, RN, BSN;Michell Giuliano Luther Parody, BS, EP, Exercise Physiologist   Supervising physician immediately available to respond to emergencies See telemetry face sheet for immediately available MD   Medication changes reported     No   Fall or balance concerns reported    No   Warm-up and Cool-down Performed as group-led instruction   Resistance Training Performed Yes   VAD Patient? No     Pain Assessment   Currently in Pain? No/denies   Pain Score 0-No pain   Multiple Pain Sites No      Capillary Blood Glucose: No results found for this or any previous visit (from the past 24 hour(s)).    History  Smoking Status  . Former Smoker  . Packs/day: 0.50  . Years: 5.00  . Types: Cigarettes  . Start date: 08/04/1968  . Quit date: 10/10/1973  Smokeless Tobacco  . Never Used    Comment: smoked less than 1 pack every 2 weeks. Havent smoked in atleast 40 years    Goals Met:  Independence with exercise equipment Exercise tolerated well No report of cardiac concerns or symptoms Strength training completed today  Goals Unmet:  Not Applicable  Comments: Check out 1200   Dr. Kate Sable is Medical Director for [place] and [facility].

## 2016-06-05 ENCOUNTER — Encounter (HOSPITAL_COMMUNITY)
Admission: RE | Admit: 2016-06-05 | Discharge: 2016-06-05 | Disposition: A | Discharge status: Home / Self Care | Payer: Medicare Other | Source: Ambulatory Visit | Attending: Cardiology | Admitting: Cardiology

## 2016-06-05 DIAGNOSIS — Z87891 Personal history of nicotine dependence: Secondary | ICD-10-CM | POA: Diagnosis not present

## 2016-06-05 DIAGNOSIS — I10 Essential (primary) hypertension: Secondary | ICD-10-CM | POA: Diagnosis not present

## 2016-06-05 DIAGNOSIS — R011 Cardiac murmur, unspecified: Secondary | ICD-10-CM | POA: Diagnosis not present

## 2016-06-05 DIAGNOSIS — Z952 Presence of prosthetic heart valve: Secondary | ICD-10-CM | POA: Diagnosis not present

## 2016-06-05 NOTE — Progress Notes (Signed)
Daily Session Note  Patient Details  Name: Angela Dawson MRN: [id number] Date of Birth: 01-26-1946 Referring Provider:     CARDIAC REHAB PHASE II ORIENTATION from 04/13/2016 in [place]  Referring Provider  Dr. Harl Bowie      Encounter Date: 06/05/2016  Check In:     Session Check In - 06/05/16 1118      Check-In   Location [facility]   Staff Present Suzanne Boron, BS, EP, Exercise Physiologist;Debra Wynetta Emery, RN, BSN   Supervising physician immediately available to respond to emergencies See telemetry face sheet for immediately available MD   Medication changes reported     No   Fall or balance concerns reported    No   Warm-up and Cool-down Performed as group-led instruction   Resistance Training Performed Yes   VAD Patient? No     Pain Assessment   Currently in Pain? No/denies   Pain Score 0-No pain   Multiple Pain Sites No      Capillary Blood Glucose: No results found for this or any previous visit (from the past 24 hour(s)).    History  Smoking Status  . Former Smoker  . Packs/day: 0.50  . Years: 5.00  . Types: Cigarettes  . Start date: 08/04/1968  . Quit date: 10/10/1973  Smokeless Tobacco  . Never Used    Comment: smoked less than 1 pack every 2 weeks. Havent smoked in atleast 40 years    Goals Met:  Independence with exercise equipment Exercise tolerated well No report of cardiac concerns or symptoms Strength training completed today  Goals Unmet:  Not Applicable  Comments: Check out 1200   Dr. Kate Sable is Medical Director for [place] and [facility].

## 2016-06-06 ENCOUNTER — Telehealth: Payer: Self-pay | Admitting: Cardiology

## 2016-06-06 NOTE — Telephone Encounter (Signed)
Angela Dawson called stating that she is having a dental procedure on 06/15/16. Dr. Laural Benes told patient that she would need to contact Dr.Branch and have him call in an antibiotic.  [facility] (dentist) [phone]. Patient would like a call back.

## 2016-06-06 NOTE — Telephone Encounter (Signed)
We have her listed as being allergic to penicillin, please verify. If show she should take azithromycin  once 1 hour prior to procedure   Dominga Ferry MD

## 2016-06-06 NOTE — Telephone Encounter (Signed)
LM for pt to returncall

## 2016-06-07 ENCOUNTER — Encounter (HOSPITAL_COMMUNITY)
Admission: RE | Admit: 2016-06-07 | Discharge: 2016-06-07 | Disposition: A | Discharge status: Home / Self Care | Payer: Medicare Other | Source: Ambulatory Visit | Attending: Cardiology | Admitting: Cardiology

## 2016-06-07 DIAGNOSIS — R011 Cardiac murmur, unspecified: Secondary | ICD-10-CM | POA: Diagnosis not present

## 2016-06-07 DIAGNOSIS — Z952 Presence of prosthetic heart valve: Secondary | ICD-10-CM

## 2016-06-07 DIAGNOSIS — Z87891 Personal history of nicotine dependence: Secondary | ICD-10-CM | POA: Diagnosis not present

## 2016-06-07 DIAGNOSIS — I10 Essential (primary) hypertension: Secondary | ICD-10-CM | POA: Diagnosis not present

## 2016-06-07 MED ORDER — AZITHROMYCIN 500 MG PO TABS: ORAL_TABLET | ORAL | 0 refills | Status: DC

## 2016-06-07 NOTE — Progress Notes (Signed)
Daily Session Note  Patient Details  Name: CATLIN DORIA MRN: [id number] Date of Birth: [birth year] Referring Provider:     CARDIAC REHAB PHASE II ORIENTATION from 04/13/2016 in [place]  Referring Provider  Dr. Harl Bowie      Encounter Date: 06/07/2016  Check In:     Session Check In - 06/07/16 1100      Check-In   Location [facility]   Staff Present Aundra Dubin, RN, BSN;Jwan Hornbaker Luther Parody, BS, EP, Exercise Physiologist   Supervising physician immediately available to respond to emergencies See telemetry face sheet for immediately available MD   Medication changes reported     No   Fall or balance concerns reported    No   Warm-up and Cool-down Performed as group-led instruction   Resistance Training Performed Yes   VAD Patient? No     Pain Assessment   Currently in Pain? No/denies   Pain Score 0-No pain   Multiple Pain Sites No      Capillary Blood Glucose: No results found for this or any previous visit (from the past 24 hour(s)).    History  Smoking Status  . Former Smoker  . Packs/day: 0.50  . Years: 5.00  . Types: Cigarettes  . Start date: 08/04/1968  . Quit date: 10/10/1973  Smokeless Tobacco  . Never Used    Comment: smoked less than 1 pack every 2 weeks. Havent smoked in atleast 40 years    Goals Met:  Independence with exercise equipment Exercise tolerated well No report of cardiac concerns or symptoms Strength training completed today  Goals Unmet:  Not Applicable  Comments: Check out 1200   Dr. Kate Sable is Medical Director for [place] and [facility] Re[facility].

## 2016-06-07 NOTE — Telephone Encounter (Signed)
LM for pt to returncall

## 2016-06-07 NOTE — Telephone Encounter (Signed)
Pt confirmed allergy to penicillin - azithromycin 500 mg 1 tablet sent to [facility] as requested

## 2016-06-07 NOTE — Progress Notes (Signed)
Cardiac Individual Treatment Plan  Patient Details  Name: Angela Dawson MRN: [id number] Date of Birth: 1945/12/09 Referring Provider:     CARDIAC REHAB PHASE II ORIENTATION from 04/13/2016 in [place]  Referring Provider  Dr. Harl Bowie      Initial Encounter Date:    CARDIAC REHAB PHASE II ORIENTATION from 04/13/2016 in [place]  Date  04/13/16  Referring Provider  Dr. Harl Bowie      Visit Diagnosis: S/P AVR (aortic valve replacement)  Patient's Home Medications on Admission:  Current Outpatient Prescriptions:  .  acetaminophen (TYLENOL) 325 MG tablet, Take 2 tablets (650 mg total) by mouth every 6 (six) hours as needed for mild pain., Disp: , Rfl:  .  aspirin EC 325 MG EC tablet, Take 1 tablet (325 mg total) by mouth daily., Disp: 30 tablet, Rfl: 0 .  azithromycin (ZITHROMAX) 500 MG tablet, TAKE 1 TABLET 1 HOUR PRIOR TO DENTAL PROCEDURE, Disp: 1 tablet, Rfl: 0 .  cetirizine (ZYRTEC) 10 MG tablet, TAKE 1 TABLET EVERY DAY, Disp: 90 tablet, Rfl: 0 .  lisinopril (PRINIVIL,ZESTRIL) 10 MG tablet, Take 1 tablet (10 mg total) by mouth daily., Disp: 90 tablet, Rfl: 3 .  metoprolol tartrate (LOPRESSOR) 25 MG tablet, Take 0.5 tablets (12.5 mg total) by mouth 2 (two) times daily., Disp: 90 tablet, Rfl: 3  Past Medical History: Past Medical History:  Diagnosis Date  . Allergy    seasonal  . Chicken pox   . Complication of anesthesia    difficult to wake up after tubal ligations  . Dyspnea    with exertion  . Heart murmur   . History of bronchitis   . History of kidney stones    "only one"  . Hypertension   . Measles     Tobacco Use: History  Smoking Status  . Former Smoker  . Packs/day: 0.50  . Years: 5.00  . Types: Cigarettes  . Start date: 08/04/1968  . Quit date: 10/10/1973  Smokeless Tobacco  . Never Used    Comment: smoked less than 1 pack every 2 weeks. Havent smoked in atleast 40 years    Labs: Recent Review Flowsheet Data     Labs for ITP Cardiac and Pulmonary Rehab Latest Ref Rng & Units 01/18/2016 01/18/2016 01/18/2016 01/18/2016 02/29/2016   Cholestrol 100 - 199 mg/dL - - - - 188   LDLCALC 0 - 99 mg/dL - - - - 120(H)   HDL >39 mg/dL - - - - 43   Trlycerides 0 - 149 mg/dL - - - - 127   Hemoglobin A1c 4.8 - 5.6 % - - - - -   PHART 7.350 - 7.450 7.495(H) 7.375 7.394 - -   PCO2ART 32.0 - 48.0 mmHg 33.2 40.7 38.6 - -   HCO3 20.0 - 28.0 mmol/L 25.4 23.7 23.5 - -   TCO2 0 - 100 mmol/L _0 -   ACIDBASEDEF 0.0 - 2.0 mmol/L - 1.0 1.0 - -   O2SAT % 99.0 97.0 96.0 - -      Capillary Blood Glucose: Lab Results  Component Value Date   GLUCAP 100 (H) 01/22/2016   GLUCAP 109 (H) 01/22/2016   GLUCAP 103 (H) 01/22/2016   GLUCAP 101 (H) 01/21/2016   GLUCAP 115 (H) 01/21/2016     Exercise Target Goals:    Exercise Program Goal: Individual exercise prescription set with THRR, safety & activity barriers. Participant demonstrates ability to understand and report RPE using BORG  scale, to self-measure pulse accurately, and to acknowledge the importance of the exercise prescription.  Exercise Prescription Goal: Starting with aerobic activity 30 plus minutes a day, 3 days per week for initial exercise prescription. Provide home exercise prescription and guidelines that participant acknowledges understanding prior to discharge.  Activity Barriers & Risk Stratification:     Activity Barriers & Cardiac Risk Stratification - 04/13/16 1448      Activity Barriers & Cardiac Risk Stratification   Activity Barriers None   Cardiac Risk Stratification High      6 Minute Walk:     6 Minute Walk    Row Name 04/13/16 1443         6 Minute Walk   Phase Initial     Distance 1500 feet     Distance % Change 0 %     Walk Time 6 minutes     # of Rest Breaks 0     MPH 2.84     METS 3.17     RPE 13     Perceived Dyspnea  14     VO2 Peak 11.78     Symptoms No     Resting HR 74 bpm     Resting BP 158/80      Max Ex. HR 124 bpm     Max Ex. BP 198/100     2 Minute Post BP 162/92        Oxygen Initial Assessment:   Oxygen Re-Evaluation:   Oxygen Discharge (Final Oxygen Re-Evaluation):   Initial Exercise Prescription:     Initial Exercise Prescription - 04/13/16 1200      Date of Initial Exercise RX and Referring Provider   Date 04/13/16   Referring Provider Dr. Harl Bowie     NuStep   Level 2   SPM 15   Minutes 15   METs 1.9     Arm Ergometer   Level 2   Watts 20   Minutes 20   METs 2.8     Prescription Details   Frequency (times per week) 3   Duration Progress to 30 minutes of continuous aerobic without signs/symptoms of physical distress     Intensity   THRR REST +  30   THRR 40-80% of Max Heartrate 105-120-136   Ratings of Perceived Exertion 11-13   Perceived Dyspnea 0-4     Progression   Progression Continue progressive overload as per policy without signs/symptoms or physical distress.     Resistance Training   Training Prescription Yes   Weight 1   Reps 10-12      Perform Capillary Blood Glucose checks as needed.  Exercise Prescription Changes:      Exercise Prescription Changes    Row Name 05/08/16 0800 05/24/16 1000 06/05/16 1300         Response to Exercise   Blood Pressure (Admit) 168/78 138/76 150/82     Blood Pressure (Exercise) 184/80 150/84 170/70     Blood Pressure (Exit) 138/78 130/72 140/66     Heart Rate (Admit) 62 bpm 63 bpm 61 bpm     Heart Rate (Exercise) 99 bpm 94 bpm 104 bpm     Heart Rate (Exit) 71 bpm 70 bpm 72 bpm     Rating of Perceived Exertion (Exercise) '11 11 11     '$ Duration Progress to 30 minutes of  aerobic without signs/symptoms of physical distress Progress to 30 minutes of  aerobic without signs/symptoms of physical distress Progress to 30 minutes of  aerobic without signs/symptoms of physical distress     Intensity THRR unchanged THRR unchanged THRR unchanged       Resistance Training   Training Prescription Yes  Yes Yes     Weight '2 2 2     '$ Reps 10-15 10-15 10-15       NuStep   Level '2 3 3     '$ SPM 21 33 28     Minutes '20 20 20     '$ METs 3.65 3.65 3.66       Arm Ergometer   Level 2.5 2.5 2.6     Watts 31 37 40     Minutes '15 15 15     '$ METs 3.4 3.9 4.1       Home Exercise Plan   Plans to continue exercise at Home (comment) Home (comment) Home (comment)     Frequency Add 2 additional days to program exercise sessions. Add 2 additional days to program exercise sessions. Add 2 additional days to program exercise sessions.        Exercise Comments:      Exercise Comments    Row Name 05/08/16 0840 05/24/16 1050 06/05/16 1333       Exercise Comments Patient is progresssing well and it doing exceptional on the Arm ergometer Patient is progressing well in both the arm crank and nustep.  Patient is progressing very well especially on the arm crank and she is maintaining her level and watts on the nustep        Exercise Goals and Review:   Exercise Goals Re-Evaluation :     Exercise Goals Re-Evaluation    Row Name 05/09/16 0740 06/07/16 1432           Exercise Goal Re-Evaluation   Exercise Goals Review Increase Strenth and Stamina;Increase Physical Activity Increase Physical Activity;Increase Strenth and Stamina      Comments Patient says her strength and stamina have increased. She is able to do more around the house with less fatigue.  After completing 21 sessions, patient's strength, stamina and activity have increased. She says she feels much better and is able to do things around the house she has not done in a long time.       Expected Outcomes Patient will continue to progress in the program.  Patient will complete the program with continued strength, stamina and activity.          Discharge Exercise Prescription (Final Exercise Prescription Changes):     Exercise Prescription Changes - 06/05/16 1300      Response to Exercise   Blood Pressure (Admit) 150/82   Blood  Pressure (Exercise) 170/70   Blood Pressure (Exit) 140/66   Heart Rate (Admit) 61 bpm   Heart Rate (Exercise) 104 bpm   Heart Rate (Exit) 72 bpm   Rating of Perceived Exertion (Exercise) 11   Duration Progress to 30 minutes of  aerobic without signs/symptoms of physical distress   Intensity THRR unchanged     Resistance Training   Training Prescription Yes   Weight 2   Reps 10-15     NuStep   Level 3   SPM 28   Minutes 20   METs 3.66     Arm Ergometer   Level 2.6   Watts 40   Minutes 15   METs 4.1     Home Exercise Plan   Plans to continue exercise at Home (comment)   Frequency Add 2 additional days to program exercise sessions.  Nutrition:  Target Goals: Understanding of nutrition guidelines, daily intake of sodium '1500mg'$ , cholesterol '200mg'$ , calories 30% from fat and 7% or less from saturated fats, daily to have 5 or more servings of fruits and vegetables.  Biometrics:     Pre Biometrics - 04/13/16 1253      Pre Biometrics   Height 5' (1.524 m)   Weight 212 lb 1.3 oz (96.2 kg)   Waist Circumference 41 inches   Hip Circumference 47 inches   Waist to Hip Ratio 0.87 %   BMI (Calculated) 41.5   Triceps Skinfold 30 mm   % Body Fat 50 %   Grip Strength 50 kg   Flexibility 0 in   Single Leg Stand 5 seconds       Nutrition Therapy Plan and Nutrition Goals:   Nutrition Discharge: Rate Your Plate Scores:     Nutrition Assessments - 04/13/16 1450      MEDFICTS Scores   Pre Score 30      Nutrition Goals Re-Evaluation:   Nutrition Goals Discharge (Final Nutrition Goals Re-Evaluation):   Psychosocial: Target Goals: Acknowledge presence or absence of significant depression and/or stress, maximize coping skills, provide positive support system. Participant is able to verbalize types and ability to use techniques and skills needed for reducing stress and depression.  Initial Review & Psychosocial Screening:     Initial Psych Review & Screening -  04/13/16 Amelia? Yes     Barriers   Psychosocial barriers to participate in program There are no identifiable barriers or psychosocial needs.     Screening Interventions   Interventions Encouraged to exercise      Quality of Life Scores:     Quality of Life - 04/13/16 1253      Quality of Life Scores   Health/Function Pre 29.54 %   Socioeconomic Pre 29.14 %   Psych/Spiritual Pre 29.14 %   Family Pre 30 %   GLOBAL Pre 29.42 %      PHQ-9: Recent Review Flowsheet Data    Depression screen [facility] 2/9 04/13/2016 02/29/2016 11/30/2015 10/11/2015 08/30/2015   Decreased Interest 0 0 0 0 0   Down, Depressed, Hopeless 0 0 0 0 0   PHQ - 2 Score 0 0 0 0 0   Altered sleeping 0 - - - -   Tired, decreased energy 0 - - - -   Change in appetite 0 - - - -   Feeling bad or failure about yourself  0 - - - -   Trouble concentrating 0 - - - -   Moving slowly or fidgety/restless 0 - - - -   Suicidal thoughts 0 - - - -   PHQ-9 Score 0 - - - -     Interpretation of Total Score  Total Score Depression Severity:  1-4 = Minimal depression, 5-9 = Mild depression, 10-14 = Moderate depression, 15-19 = Moderately severe depression, 20-27 = Severe depression   Psychosocial Evaluation and Intervention:     Psychosocial Evaluation - 04/13/16 1456      Psychosocial Evaluation & Interventions   Interventions Encouraged to exercise with the program and follow exercise prescription   Comments Patient has no psychosocial barriers identified at orientation.    Continue Psychosocial Services  No      Psychosocial Re-Evaluation:     Psychosocial Re-Evaluation    Inez Name 05/09/16 [phone] 06/07/16 1435  Psychosocial Re-Evaluation   Current issues with None Identified None Identified      Comments Patient's QOL score was 29.42 and her PHQ-9 score was 0.  -      Expected Outcomes  - Patient will have no psychosocial barriers identified at discharge.        Interventions  - Encouraged to attend Cardiac Rehabilitation for the exercise      Continue Psychosocial Services  No Follow up required No Follow up required         Psychosocial Discharge (Final Psychosocial Re-Evaluation):     Psychosocial Re-Evaluation - 06/07/16 1435      Psychosocial Re-Evaluation   Current issues with None Identified   Expected Outcomes Patient will have no psychosocial barriers identified at discharge.    Interventions Encouraged to attend Cardiac Rehabilitation for the exercise   Continue Psychosocial Services  No Follow up required      Vocational Rehabilitation: Provide vocational rehab assistance to qualifying candidates.   Vocational Rehab Evaluation & Intervention:     Vocational Rehab - 04/13/16 1450      Initial Vocational Rehab Evaluation & Intervention   Assessment shows need for Vocational Rehabilitation No      Education: Education Goals: Education classes will be provided on a weekly basis, covering required topics. Participant will state understanding/return demonstration of topics presented.  Learning Barriers/Preferences:     Learning Barriers/Preferences - 04/13/16 1449      Learning Barriers/Preferences   Learning Barriers None   Learning Preferences Skilled Demonstration      Education Topics: Hypertension, Hypertension Reduction -Define heart disease and high blood pressure. Discus how high blood pressure affects the body and ways to reduce high blood pressure.   Exercise and Your Heart -Discuss why it is important to exercise, the FITT principles of exercise, normal and abnormal responses to exercise, and how to exercise safely.   Angina -Discuss definition of angina, causes of angina, treatment of angina, and how to decrease risk of having angina.   CARDIAC REHAB PHASE II EXERCISE from 06/07/2016 in [place] [facility]  Date  04/19/16  Educator  DC  Instruction Review Code  2- meets goals/outcomes       Cardiac Medications -Review what the following cardiac medications are used for, how they affect the body, and side effects that may occur when taking the medications.  Medications include Aspirin, Beta blockers, calcium channel blockers, ACE Inhibitors, angiotensin receptor blockers, diuretics, digoxin, and antihyperlipidemics.   CARDIAC REHAB PHASE II EXERCISE from 06/07/2016 in [place] [facility]  Date  04/26/16  Educator  DC  Instruction Review Code  2- meets goals/outcomes      Congestive Heart Failure -Discuss the definition of CHF, how to live with CHF, the signs and symptoms of CHF, and how keep track of weight and sodium intake.   CARDIAC REHAB PHASE II EXERCISE from 06/07/2016 in [place] [facility]  Date  05/03/16  Educator  DC  Instruction Review Code  2- meets goals/outcomes      Heart Disease and Intimacy -Discus the effect sexual activity has on the heart, how changes occur during intimacy as we age, and safety during sexual activity.   CARDIAC REHAB PHASE II EXERCISE from 06/07/2016 in [place] [facility]  Date  05/10/16  Educator  DC  Instruction Review Code  2- meets goals/outcomes      Smoking Cessation / COPD -Discuss different methods to quit smoking, the health benefits of quitting smoking, and  the definition of COPD.   CARDIAC REHAB PHASE II EXERCISE from 06/07/2016 in [place]  Date  05/17/16  Educator  DC  Instruction Review Code  2- meets goals/outcomes      Nutrition I: Fats -Discuss the types of cholesterol, what cholesterol does to the heart, and how cholesterol levels can be controlled.   Nutrition II: Labels -Discuss the different components of food labels and how to read food label   CARDIAC REHAB PHASE II EXERCISE from 06/07/2016 in [place]  Date  05/31/16  Educator  DC  Instruction Review Code  2- meets goals/outcomes      Heart Parts and  Heart Disease -Discuss the anatomy of the heart, the pathway of blood circulation through the heart, and these are affected by heart disease.   CARDIAC REHAB PHASE II EXERCISE from 06/07/2016 in [place]  Date  06/07/16  Educator  DJ  Instruction Review Code  2- meets goals/outcomes      Stress I: Signs and Symptoms -Discuss the causes of stress, how stress may lead to anxiety and depression, and ways to limit stress.   Stress II: Relaxation -Discuss different types of relaxation techniques to limit stress.   Warning Signs of Stroke / TIA -Discuss definition of a stroke, what the signs and symptoms are of a stroke, and how to identify when someone is having stroke.   Knowledge Questionnaire Score:     Knowledge Questionnaire Score - 04/13/16 1449      Knowledge Questionnaire Score   Pre Score 21/24      Core Components/Risk Factors/Patient Goals at Admission:     Personal Goals and Risk Factors at Admission - 04/13/16 1451      Core Components/Risk Factors/Patient Goals on Admission    Weight Management Obesity;Yes   Admit Weight 212 lb (96.2 kg)   Goal Weight: Short Term 202 lb (91.6 kg)   Goal Weight: Long Term 187 lb (84.8 kg)   Expected Outcomes Short Term: Continue to assess and modify interventions until short term weight is achieved   Increase Strength and Stamina Yes   Intervention Provide advice, education, support and counseling about physical activity/exercise needs.;Develop an individualized exercise prescription for aerobic and resistive training based on initial evaluation findings, risk stratification, comorbidities and participant's personal goals.   Expected Outcomes Achievement of increased cardiorespiratory fitness and enhanced flexibility, muscular endurance and strength shown through measurements of functional capacity and personal statement of participant.   Personal Goal Other Yes   Personal Goal Lose 25 lbs at graduation.  Lose 2 lbs/week. Get stronger; be able to walk more.   Intervention Patient will attend cardiac rehab 3 days/week and supplement with exercise at home 2 days/week.    Expected Outcomes Patient will meet his personal goal.       Core Components/Risk Factors/Patient Goals Review:      Goals and Risk Factor Review    Row Name 04/13/16 1455 05/09/16 0737 06/07/16 1430         Core Components/Risk Factors/Patient Goals Review   Personal Goals Review Weight Management/Obesity;Increase Strength and Stamina Weight Management/Obesity;Hypertension  Get stronger; lose 25 lbs; get back to walking again.  Weight Management/Obesity  Get stronger; lose 25 lbs; get back to walking again.      Review  - Patient has completed 9 sessions. She has lost .2 lbs since starting the program. Her b/p continues to be hypertensive. She says she feels better since starting the  program. Will continue to monitor for progress.  Patient has completed 21 sessions gaining 1 lb. Her b/p readings have improved. She is progressing well in the program.      Expected Outcomes  - Patient will complete the program meeting her personal goals.  Patient will complete the program meeting her personal goals.         Core Components/Risk Factors/Patient Goals at Discharge (Final Review):      Goals and Risk Factor Review - 06/07/16 1430      Core Components/Risk Factors/Patient Goals Review   Personal Goals Review Weight Management/Obesity  Get stronger; lose 25 lbs; get back to walking again.    Review Patient has completed 21 sessions gaining 1 lb. Her b/p readings have improved. She is progressing well in the program.    Expected Outcomes Patient will complete the program meeting her personal goals.       ITP Comments:     ITP Comments    Row Name 04/13/16 1534 05/08/16 1504         ITP Comments Patient new to program. Plans to start 04/17/16. Patient met with Registered Dietitian to discuss nutrition topics  including: Heart healthty eating, heart health cooking and make smart choices when shopping; Portion control; weight management; and hydration. Patient attended a group session with the hospital chaplian called [facility] to discuss and share how her recent cardiac diagnosis has effected her life.         Comments: ITP 30 Day REVIEW Patient doing well in the program. Will continue to monitor for progress.

## 2016-06-09 ENCOUNTER — Encounter (HOSPITAL_COMMUNITY)
Admission: RE | Admit: 2016-06-09 | Discharge: 2016-06-09 | Disposition: A | Discharge status: Home / Self Care | Payer: Medicare Other | Source: Ambulatory Visit | Attending: Cardiology | Admitting: Cardiology

## 2016-06-09 DIAGNOSIS — Z952 Presence of prosthetic heart valve: Secondary | ICD-10-CM | POA: Diagnosis not present

## 2016-06-09 DIAGNOSIS — I10 Essential (primary) hypertension: Secondary | ICD-10-CM | POA: Diagnosis not present

## 2016-06-09 DIAGNOSIS — Z87891 Personal history of nicotine dependence: Secondary | ICD-10-CM | POA: Diagnosis not present

## 2016-06-09 DIAGNOSIS — R011 Cardiac murmur, unspecified: Secondary | ICD-10-CM | POA: Diagnosis not present

## 2016-06-09 NOTE — Progress Notes (Signed)
Daily Session Note  Patient Details  Name: Angela Dawson MRN: [id number] Date of Birth: [birth year] Referring Provider:     CARDIAC REHAB PHASE II ORIENTATION from 04/13/2016 in [place]  Referring Provider  Dr. Harl Bowie      Encounter Date: 06/09/2016  Check In:     Session Check In - 06/09/16 1108      Check-In   Location [facility]   Staff Present Aundra Dubin, RN, BSN;Quandra Fedorchak Luther Parody, BS, EP, Exercise Physiologist   Supervising physician immediately available to respond to emergencies See telemetry face sheet for immediately available MD   Medication changes reported     No   Fall or balance concerns reported    No   Warm-up and Cool-down Performed as group-led instruction   Resistance Training Performed Yes   VAD Patient? No     Pain Assessment   Currently in Pain? No/denies   Pain Score 0-No pain   Multiple Pain Sites No      Capillary Blood Glucose: No results found for this or any previous visit (from the past 24 hour(s)).    History  Smoking Status  . Former Smoker  . Packs/day: 0.50  . Years: 5.00  . Types: Cigarettes  . Start date: 08/04/1968  . Quit date: 10/10/1973  Smokeless Tobacco  . Never Used    Comment: smoked less than 1 pack every 2 weeks. Havent smoked in atleast 40 years    Goals Met:  Independence with exercise equipment Exercise tolerated well No report of cardiac concerns or symptoms Strength training completed today  Goals Unmet:  Not Applicable  Comments: Check out 1200   Dr. Kate Sable is Medical Director for [facility] and Pulmonary Re[facility].

## 2016-06-12 ENCOUNTER — Encounter (HOSPITAL_COMMUNITY)
Admission: RE | Admit: 2016-06-12 | Discharge: 2016-06-12 | Disposition: A | Discharge status: Home / Self Care | Payer: Medicare Other | Source: Ambulatory Visit | Attending: Cardiology | Admitting: Cardiology

## 2016-06-12 DIAGNOSIS — Z87891 Personal history of nicotine dependence: Secondary | ICD-10-CM | POA: Diagnosis not present

## 2016-06-12 DIAGNOSIS — Z952 Presence of prosthetic heart valve: Secondary | ICD-10-CM

## 2016-06-12 DIAGNOSIS — R011 Cardiac murmur, unspecified: Secondary | ICD-10-CM | POA: Diagnosis not present

## 2016-06-12 DIAGNOSIS — I10 Essential (primary) hypertension: Secondary | ICD-10-CM | POA: Diagnosis not present

## 2016-06-12 NOTE — Progress Notes (Addendum)
Daily Session Note  Patient Details  Name: Angela Dawson MRN: [id number] Date of Birth: 1945-08-26 Referring Provider:     CARDIAC REHAB PHASE II ORIENTATION from 04/13/2016 in [place]  Referring Provider  Dr. Harl Bowie      Encounter Date: 06/12/2016  Check In:     Session Check In - 06/12/16 1100      Check-In   Location [facility]   Staff Present Diane Angelina Pih, MS, EP, CH[facility], Exercise Physiologist;Gregory Luther Parody, BS, EP, Exercise Physiologist;Korrine Sicard Wynetta Emery, RN, BSN   Supervising physician immediately available to respond to emergencies See telemetry face sheet for immediately available MD   Medication changes reported     No   Fall or balance concerns reported    No   Warm-up and Cool-down Performed as group-led instruction   Resistance Training Performed Yes   VAD Patient? No     Pain Assessment   Currently in Pain? No/denies   Pain Score 0-No pain   Multiple Pain Sites No      Capillary Blood Glucose: No results found for this or any previous visit (from the past 24 hour(s)).    History  Smoking Status  . Former Smoker  . Packs/day: 0.50  . Years: 5.00  . Types: Cigarettes  . Start date: 08/04/1968  . Quit date: 10/10/1973  Smokeless Tobacco  . Never Used    Comment: smoked less than 1 pack every 2 weeks. Havent smoked in atleast 40 years    Goals Met:  Independence with exercise equipment Exercise tolerated well No report of cardiac concerns or symptoms Strength training completed today  Goals Unmet:  Not Applicable  Comments: Check out 1200.   Dr. Kate Sable is Medical Director for [facility] and [facility] Re[facility].

## 2016-06-14 ENCOUNTER — Encounter (HOSPITAL_COMMUNITY)
Admission: RE | Admit: 2016-06-14 | Discharge: 2016-06-14 | Disposition: A | Discharge status: Home / Self Care | Payer: Medicare Other | Source: Ambulatory Visit | Attending: Cardiology | Admitting: Cardiology

## 2016-06-14 DIAGNOSIS — I10 Essential (primary) hypertension: Secondary | ICD-10-CM | POA: Diagnosis not present

## 2016-06-14 DIAGNOSIS — Z87891 Personal history of nicotine dependence: Secondary | ICD-10-CM | POA: Diagnosis not present

## 2016-06-14 DIAGNOSIS — Z952 Presence of prosthetic heart valve: Secondary | ICD-10-CM | POA: Diagnosis not present

## 2016-06-14 DIAGNOSIS — R011 Cardiac murmur, unspecified: Secondary | ICD-10-CM | POA: Diagnosis not present

## 2016-06-14 NOTE — Progress Notes (Signed)
Daily Session Note  Patient Details  Name: Angela Dawson MRN: [id number] Date of Birth: January 24, 1946 Referring Provider:     CARDIAC REHAB PHASE II ORIENTATION from 04/13/2016 in [place]  Referring Provider  Dr. Harl Bowie      Encounter Date: 06/14/2016  Check In:     Session Check In - 06/14/16 1100      Check-In   Location [facility]   Staff Present Myeasha Ballowe Angelina Pih, MS, EP, CHC, Exercise Physiologist;Gregory Luther Parody, BS, EP, Exercise Physiologist;Debra Wynetta Emery, RN, BSN   Supervising physician immediately available to respond to emergencies See telemetry face sheet for immediately available MD   Fall or balance concerns reported    No   Tobacco Cessation --  Non smoker   Warm-up and Cool-down Performed as group-led instruction   Resistance Training Performed Yes   VAD Patient? No     Pain Assessment   Currently in Pain? No/denies   Pain Score 0-No pain   Multiple Pain Sites No      Capillary Blood Glucose: No results found for this or any previous visit (from the past 24 hour(s)).    History  Smoking Status  . Former Smoker  . Packs/day: 0.50  . Years: 5.00  . Types: Cigarettes  . Start date: 08/04/1968  . Quit date: 10/10/1973  Smokeless Tobacco  . Never Used    Comment: smoked less than 1 pack every 2 weeks. Havent smoked in atleast 40 years    Goals Met:  Independence with exercise equipment Exercise tolerated well No report of cardiac concerns or symptoms Strength training completed today  Goals Unmet:  Not Applicable  Comments: Check out: 12 noon   Dr. Kate Sable is Medical Director for [place] and [facility].

## 2016-06-16 ENCOUNTER — Encounter (HOSPITAL_COMMUNITY)
Admission: RE | Admit: 2016-06-16 | Discharge: 2016-06-16 | Disposition: A | Discharge status: Home / Self Care | Payer: Medicare Other | Source: Ambulatory Visit | Attending: Cardiology | Admitting: Cardiology

## 2016-06-16 DIAGNOSIS — Z952 Presence of prosthetic heart valve: Secondary | ICD-10-CM

## 2016-06-16 DIAGNOSIS — R011 Cardiac murmur, unspecified: Secondary | ICD-10-CM | POA: Diagnosis not present

## 2016-06-16 DIAGNOSIS — I10 Essential (primary) hypertension: Secondary | ICD-10-CM | POA: Diagnosis not present

## 2016-06-16 DIAGNOSIS — Z87891 Personal history of nicotine dependence: Secondary | ICD-10-CM | POA: Diagnosis not present

## 2016-06-16 NOTE — Progress Notes (Signed)
Daily Session Note  Patient Details  Name: Angela Dawson MRN: [id number] Date of Birth: 1945-03-29 Referring Provider:     CARDIAC REHAB PHASE II ORIENTATION from 04/13/2016 in [place]  Referring Provider  Dr. Harl Bowie      Encounter Date: 06/16/2016  Check In:     Session Check In - 06/16/16 1100      Check-In   Location [facility]   Staff Present Aundra Dubin, RN, BSN;Leilynn Pilat Luther Parody, BS, EP, Exercise Physiologist   Supervising physician immediately available to respond to emergencies See telemetry face sheet for immediately available MD   Medication changes reported     No   Fall or balance concerns reported    No   Warm-up and Cool-down Performed as group-led instruction   Resistance Training Performed Yes   VAD Patient? No     Pain Assessment   Currently in Pain? No/denies   Pain Score 0-No pain   Multiple Pain Sites No      Capillary Blood Glucose: No results found for this or any previous visit (from the past 24 hour(s)).    History  Smoking Status  . Former Smoker  . Packs/day: 0.50  . Years: 5.00  . Types: Cigarettes  . Start date: 08/04/1968  . Quit date: 10/10/1973  Smokeless Tobacco  . Never Used    Comment: smoked less than 1 pack every 2 weeks. Havent smoked in atleast 40 years    Goals Met:  Independence with exercise equipment Exercise tolerated well No report of cardiac concerns or symptoms Strength training completed today  Goals Unmet:  Not Applicable  Comments: Check out 1200   Dr. Kate Sable is Medical Director for [place] and [facility].

## 2016-06-19 ENCOUNTER — Encounter (HOSPITAL_COMMUNITY)
Admission: RE | Admit: 2016-06-19 | Discharge: 2016-06-19 | Disposition: A | Discharge status: Home / Self Care | Payer: Medicare Other | Source: Ambulatory Visit | Attending: Cardiology | Admitting: Cardiology

## 2016-06-19 DIAGNOSIS — Z87891 Personal history of nicotine dependence: Secondary | ICD-10-CM | POA: Diagnosis not present

## 2016-06-19 DIAGNOSIS — Z952 Presence of prosthetic heart valve: Secondary | ICD-10-CM

## 2016-06-19 DIAGNOSIS — I10 Essential (primary) hypertension: Secondary | ICD-10-CM | POA: Diagnosis not present

## 2016-06-19 DIAGNOSIS — R011 Cardiac murmur, unspecified: Secondary | ICD-10-CM | POA: Diagnosis not present

## 2016-06-19 NOTE — Progress Notes (Signed)
Daily Session Note  Patient Details  Name: Angela Dawson MRN: [id number] Date of Birth: 1945-08-27 Referring Provider:     CARDIAC REHAB PHASE II ORIENTATION from 04/13/2016 in [place]  Referring Provider  Dr. Harl Bowie      Encounter Date: 06/19/2016  Check In:     Session Check In - 06/19/16 1113      Check-In   Location [facility]   Staff Present Aundra Dubin, RN, BSN;Shristi Scheib Luther Parody, BS, EP, Exercise Physiologist   Supervising physician immediately available to respond to emergencies See telemetry face sheet for immediately available MD   Medication changes reported     No   Fall or balance concerns reported    No   Warm-up and Cool-down Performed as group-led instruction   Resistance Training Performed Yes   VAD Patient? No     Pain Assessment   Currently in Pain? No/denies   Pain Score 0-No pain   Multiple Pain Sites No      Capillary Blood Glucose: No results found for this or any previous visit (from the past 24 hour(s)).    History  Smoking Status  . Former Smoker  . Packs/day: 0.50  . Years: 5.00  . Types: Cigarettes  . Start date: 08/04/1968  . Quit date: 10/10/1973  Smokeless Tobacco  . Never Used    Comment: smoked less than 1 pack every 2 weeks. Havent smoked in atleast 40 years    Goals Met:  Independence with exercise equipment Exercise tolerated well No report of cardiac concerns or symptoms Strength training completed today  Goals Unmet:  Not Applicable  Comments: Check out 1200   Dr. Kate Sable is Medical Director for [place] and [facility].

## 2016-06-21 ENCOUNTER — Encounter (HOSPITAL_COMMUNITY): Payer: Medicare Other

## 2016-06-23 ENCOUNTER — Encounter (HOSPITAL_COMMUNITY): Payer: Medicare Other

## 2016-06-26 ENCOUNTER — Encounter (HOSPITAL_COMMUNITY): Payer: Medicare Other

## 2016-06-28 ENCOUNTER — Encounter (HOSPITAL_COMMUNITY)
Admission: RE | Admit: 2016-06-28 | Discharge: 2016-06-28 | Disposition: A | Discharge status: Home / Self Care | Payer: Medicare Other | Source: Ambulatory Visit | Attending: Cardiology | Admitting: Cardiology

## 2016-06-28 NOTE — Progress Notes (Signed)
Cardiac Individual Treatment Plan  Patient Details  Name: Angela Dawson MRN: [id number] Date of Birth: 12-14-1945 Referring Provider:     CARDIAC REHAB PHASE II ORIENTATION from 04/13/2016 in [place]  Referring Provider  Dr. Harl Bowie      Initial Encounter Date:    CARDIAC REHAB PHASE II ORIENTATION from 04/13/2016 in [place]  Date  04/13/16  Referring Provider  Dr. Harl Bowie      Visit Diagnosis: S/P AVR (aortic valve replacement)  Patient's Home Medications on Admission:  Current Outpatient Prescriptions:  .  acetaminophen (TYLENOL) 325 MG tablet, Take 2 tablets (650 mg total) by mouth every 6 (six) hours as needed for mild pain., Disp: , Rfl:  .  aspirin EC 325 MG EC tablet, Take 1 tablet (325 mg total) by mouth daily., Disp: 30 tablet, Rfl: 0 .  azithromycin (ZITHROMAX) 500 MG tablet, TAKE 1 TABLET 1 HOUR PRIOR TO DENTAL PROCEDURE, Disp: 1 tablet, Rfl: 0 .  cetirizine (ZYRTEC) 10 MG tablet, TAKE 1 TABLET EVERY DAY, Disp: 90 tablet, Rfl: 0 .  lisinopril (PRINIVIL,ZESTRIL) 10 MG tablet, Take 1 tablet (10 mg total) by mouth daily., Disp: 90 tablet, Rfl: 3 .  metoprolol tartrate (LOPRESSOR) 25 MG tablet, Take 0.5 tablets (12.5 mg total) by mouth 2 (two) times daily., Disp: 90 tablet, Rfl: 3  Past Medical History: Past Medical History:  Diagnosis Date  . Allergy    seasonal  . Chicken pox   . Complication of anesthesia    difficult to wake up after tubal ligations  . Dyspnea    with exertion  . Heart murmur   . History of bronchitis   . History of kidney stones    "only one"  . Hypertension   . Measles     Tobacco Use: History  Smoking Status  . Former Smoker  . Packs/day: 0.50  . Years: 5.00  . Types: Cigarettes  . Start date: 08/04/1968  . Quit date: 10/10/1973  Smokeless Tobacco  . Never Used    Comment: smoked less than 1 pack every 2 weeks. Havent smoked in atleast 40 years    Labs: Recent Review Flowsheet Data     Labs for ITP Cardiac and Pulmonary Rehab Latest Ref Rng & Units 01/18/2016 01/18/2016 01/18/2016 01/18/2016 02/29/2016   Cholestrol 100 - 199 mg/dL - - - - 188   LDLCALC 0 - 99 mg/dL - - - - 120(H)   HDL >39 mg/dL - - - - 43   Trlycerides 0 - 149 mg/dL - - - - 127   Hemoglobin A1c 4.8 - 5.6 % - - - - -   PHART 7.350 - 7.450 7.495(H) 7.375 7.394 - -   PCO2ART 32.0 - 48.0 mmHg 33.2 40.7 38.6 - -   HCO3 20.0 - 28.0 mmol/L 25.4 23.7 23.5 - -   TCO2 0 - 100 mmol/L _0 -   ACIDBASEDEF 0.0 - 2.0 mmol/L - 1.0 1.0 - -   O2SAT % 99.0 97.0 96.0 - -      Capillary Blood Glucose: Lab Results  Component Value Date   GLUCAP 100 (H) 01/22/2016   GLUCAP 109 (H) 01/22/2016   GLUCAP 103 (H) 01/22/2016   GLUCAP 101 (H) 01/21/2016   GLUCAP 115 (H) 01/21/2016     Exercise Target Goals:    Exercise Program Goal: Individual exercise prescription set with THRR, safety & activity barriers. Participant demonstrates ability to understand and report RPE using BORG  scale, to self-measure pulse accurately, and to acknowledge the importance of the exercise prescription.  Exercise Prescription Goal: Starting with aerobic activity 30 plus minutes a day, 3 days per week for initial exercise prescription. Provide home exercise prescription and guidelines that participant acknowledges understanding prior to discharge.  Activity Barriers & Risk Stratification:     Activity Barriers & Cardiac Risk Stratification - 04/13/16 1448      Activity Barriers & Cardiac Risk Stratification   Activity Barriers None   Cardiac Risk Stratification High      6 Minute Walk:     6 Minute Walk    Row Name 04/13/16 1443         6 Minute Walk   Phase Initial     Distance 1500 feet     Distance % Change 0 %     Walk Time 6 minutes     # of Rest Breaks 0     MPH 2.84     METS 3.17     RPE 13     Perceived Dyspnea  14     VO2 Peak 11.78     Symptoms No     Resting HR 74 bpm     Resting BP 158/80      Max Ex. HR 124 bpm     Max Ex. BP 198/100     2 Minute Post BP 162/92        Oxygen Initial Assessment:   Oxygen Re-Evaluation:   Oxygen Discharge (Final Oxygen Re-Evaluation):   Initial Exercise Prescription:     Initial Exercise Prescription - 04/13/16 1200      Date of Initial Exercise RX and Referring Provider   Date 04/13/16   Referring Provider Dr. Harl Bowie     NuStep   Level 2   SPM 15   Minutes 15   METs 1.9     Arm Ergometer   Level 2   Watts 20   Minutes 20   METs 2.8     Prescription Details   Frequency (times per week) 3   Duration Progress to 30 minutes of continuous aerobic without signs/symptoms of physical distress     Intensity   THRR REST +  30   THRR 40-80% of Max Heartrate 105-120-136   Ratings of Perceived Exertion 11-13   Perceived Dyspnea 0-4     Progression   Progression Continue progressive overload as per policy without signs/symptoms or physical distress.     Resistance Training   Training Prescription Yes   Weight 1   Reps 10-12      Perform Capillary Blood Glucose checks as needed.  Exercise Prescription Changes:      Exercise Prescription Changes    Row Name 05/08/16 0800 05/24/16 1000 06/05/16 1300 06/27/16 0800       Response to Exercise   Blood Pressure (Admit) 168/78 138/76 150/82 144/76    Blood Pressure (Exercise) 184/80 150/84 170/70 283/82    Blood Pressure (Exit) 138/78 130/72 140/66 138/72    Heart Rate (Admit) 62 bpm 63 bpm 61 bpm 69 bpm    Heart Rate (Exercise) 99 bpm 94 bpm 104 bpm 111 bpm    Heart Rate (Exit) 71 bpm 70 bpm 72 bpm 75 bpm    Rating of Perceived Exertion (Exercise) _0 Duration Progress to 30 minutes of  aerobic without signs/symptoms of physical distress Progress to 30 minutes of  aerobic without signs/symptoms of physical distress Progress to 30  minutes of  aerobic without signs/symptoms of physical distress Progress to 30 minutes of  aerobic without signs/symptoms of  physical distress    Intensity THRR unchanged THRR unchanged THRR unchanged THRR unchanged      Resistance Training   Training Prescription Yes Yes Yes Yes    Weight _0 Reps 10-15 10-15 10-15 10-15      NuStep   Level _1 SPM 21 33 28 32    Minutes _2 METs 3.65 3.65 3.66 3.66      Arm Ergometer   Level 2.5 2.5 2.6 3    Watts 31 37 40 40    Minutes _3 METs 3.4 3.9 4.1 4.1      Home Exercise Plan   Plans to continue exercise at Home (comment) Home (comment) Home (comment) Home (comment)    Frequency Add 2 additional days to program exercise sessions. Add 2 additional days to program exercise sessions. Add 2 additional days to program exercise sessions. Add 2 additional days to program exercise sessions.       Exercise Comments:      Exercise Comments    Row Name 05/08/16 0840 05/24/16 1050 06/05/16 1333 06/27/16 6720     Exercise Comments Patient is progresssing well and it doing exceptional on the Arm ergometer Patient is progressing well in both the arm crank and nustep.  Patient is progressing very well especially on the arm crank and she is maintaining her level and watts on the nustep Patient is doing very well in Cr especially on the arm ergometer       Exercise Goals and Review:   Exercise Goals Re-Evaluation :     Exercise Goals Re-Evaluation    Row Name 05/09/16 0740 06/07/16 1432 06/28/16 1413         Exercise Goal Re-Evaluation   Exercise Goals Review Increase Strenth and Stamina;Increase Physical Activity Increase Physical Activity;Increase Strenth and Stamina Increase Physical Activity;Increase Strenth and Stamina  Get back to walking again.     Comments Patient says her strength and stamina have increased. She is able to do more around the house with less fatigue.  After completing 21 sessions, patient's strength, stamina and activity have increased. She says she feels much better and is able to do things around the  house she has not done in a long time.  After completing 26 sessions, patient says she feels stronger and much better overall. She says she is able to do things she has not felt like doing in a long time. She says the program is really benefiting her.      Expected Outcomes Patient will continue to progress in the program.  Patient will complete the program with continued strength, stamina and activity. Patient will complete the program with continued increased strength, staimna, and activity.          Discharge Exercise Prescription (Final Exercise Prescription Changes):     Exercise Prescription Changes - 06/27/16 0800      Response to Exercise   Blood Pressure (Admit) 144/76   Blood Pressure (Exercise) 283/82   Blood Pressure (Exit) 138/72   Heart Rate (Admit) 69 bpm   Heart Rate (Exercise) 111 bpm   Heart Rate (Exit) 75 bpm   Rating of Perceived Exertion (Exercise) 12   Duration Progress to 30 minutes of  aerobic without signs/symptoms of physical distress  Intensity THRR unchanged     Resistance Training   Training Prescription Yes   Weight 2   Reps 10-15     NuStep   Level 3   SPM 32   Minutes 20   METs 3.66     Arm Ergometer   Level 3   Watts 40   Minutes 15   METs 4.1     Home Exercise Plan   Plans to continue exercise at Home (comment)   Frequency Add 2 additional days to program exercise sessions.      Nutrition:  Target Goals: Understanding of nutrition guidelines, daily intake of sodium <1554m, cholesterol <2027m calories 30% from fat and 7% or less from saturated fats, daily to have 5 or more servings of fruits and vegetables.  Biometrics:     Pre Biometrics - 04/13/16 1253      Pre Biometrics   Height 5' (1.524 m)   Weight 212 lb 1.3 oz (96.2 kg)   Waist Circumference 41 inches   Hip Circumference 47 inches   Waist to Hip Ratio 0.87 %   BMI (Calculated) 41.5   Triceps Skinfold 30 mm   % Body Fat 50 %   Grip Strength 50 kg   Flexibility 0  in   Single Leg Stand 5 seconds       Nutrition Therapy Plan and Nutrition Goals:   Nutrition Discharge: Rate Your Plate Scores:     Nutrition Assessments - 04/13/16 1450      MEDFICTS Scores   Pre Score 30      Nutrition Goals Re-Evaluation:   Nutrition Goals Discharge (Final Nutrition Goals Re-Evaluation):   Psychosocial: Target Goals: Acknowledge presence or absence of significant depression and/or stress, maximize coping skills, provide positive support system. Participant is able to verbalize types and ability to use techniques and skills needed for reducing stress and depression.  Initial Review & Psychosocial Screening:     Initial Psych Review & Screening - 04/13/16 [id number]     Barriers   Psychosocial barriers to participate in program There are no identifiable barriers or psychosocial needs.     Screening Interventions   Interventions Encouraged to exercise      Quality of Life Scores:     Quality of Life - 04/13/16 1253      Quality of Life Scores   Health/Function Pre 29.54 %   Socioeconomic Pre 29.14 %   Psych/Spiritual Pre 29.14 %   Family Pre 30 %   GLOBAL Pre 29.42 %      PHQ-9: Recent Review Flowsheet Data    [facility] 04/13/2016 02/29/2016 11/30/2015 10/11/2015 08/30/2015   Decreased Interest 0 0 0 0 0   Down, Depressed, Hopeless 0 0 0 0 0   PHQ - 2 Score 0 0 0 0 0   Altered sleeping 0 - - - -   Tired, decreased energy 0 - - - -   Change in appetite 0 - - - -   Feeling bad or failure about yourself  0 - - - -   Trouble concentrating 0 - - - -   Moving slowly or fidgety/restless 0 - - - -   Suicidal thoughts 0 - - - -   PHQ-9 Score 0 - - - -     Interpretation of Total Score  Total Score Depression Severity:  1-4 = Minimal depression, 5-9 = Mild depression, 10-14 =  Moderate depression, 15-19 = Moderately severe depression, 20-27 = Severe depression   Psychosocial  Evaluation and Intervention:     Psychosocial Evaluation - 04/13/16 1456      Psychosocial Evaluation & Interventions   Interventions Encouraged to exercise with the program and follow exercise prescription   Comments Patient has no psychosocial barriers identified at orientation.    Continue Psychosocial Services  No      Psychosocial Re-Evaluation:     Psychosocial Re-Evaluation    [place] Name 05/09/16 0741 06/07/16 1435 06/28/16 1414         Psychosocial Re-Evaluation   Current issues with None Identified None Identified None Identified     Comments Patient's QOL score was 29.42 and her PHQ-9 score was 0.  -  -     Expected Outcomes  - Patient will have no psychosocial barriers identified at discharge.  Patient will have no psychosocial issues identified at [place].      Interventions  - Encouraged to attend Cardiac Rehabilitation for the exercise Encouraged to attend Cardiac Rehabilitation for the exercise     Continue Psychosocial Services  No Follow up required No Follow up required No Follow up required        Psychosocial Discharge (Final Psychosocial Re-Evaluation):     Psychosocial Re-Evaluation - 06/28/16 1414      Psychosocial Re-Evaluation   Current issues with None Identified   Expected Outcomes Patient will have no psychosocial issues identified at [place].    Interventions Encouraged to attend Cardiac Rehabilitation for the exercise   Continue Psychosocial Services  No Follow up required      Vocational Rehabilitation: Provide vocational rehab assistance to qualifying candidates.   Vocational Rehab Evaluation & Intervention:     Vocational Rehab - 04/13/16 1450      Initial Vocational Rehab Evaluation & Intervention   Assessment shows need for Vocational Rehabilitation No      Education: Education Goals: Education classes will be provided on a weekly basis, covering required topics. Participant will state understanding/return demonstration of  topics presented.  Learning Barriers/Preferences:     Learning Barriers/Preferences - 04/13/16 1449      Learning Barriers/Preferences   Learning Barriers None   Learning Preferences Skilled Demonstration      Education Topics: Hypertension, Hypertension Reduction -Define heart disease and high blood pressure. Discus how high blood pressure affects the body and ways to reduce high blood pressure.   Exercise and Your Heart -Discuss why it is important to exercise, the FITT principles of exercise, normal and abnormal responses to exercise, and how to exercise safely.   Angina -Discuss definition of angina, causes of angina, treatment of angina, and how to decrease risk of having angina.   CARDIAC REHAB PHASE II EXERCISE from 06/14/2016 in [place]  Date  04/19/16  Educator  DC  Instruction Review Code  2- meets goals/outcomes      Cardiac Medications -Review what the following cardiac medications are used for, how they affect the body, and side effects that may occur when taking the medications.  Medications include Aspirin, Beta blockers, calcium channel blockers, ACE Inhibitors, angiotensin receptor blockers, diuretics, digoxin, and antihyperlipidemics.   CARDIAC REHAB PHASE II EXERCISE from 06/14/2016 in [place]  Date  04/26/16  Educator  DC  Instruction Review Code  2- meets goals/outcomes      Congestive Heart Failure -Discuss the definition of CHF, how to live with CHF, the signs and symptoms of CHF, and how  keep track of weight and sodium intake.   CARDIAC REHAB PHASE II EXERCISE from 06/14/2016 in [place]  Date  05/03/16  Educator  DC  Instruction Review Code  2- meets goals/outcomes      Heart Disease and Intimacy -Discus the effect sexual activity has on the heart, how changes occur during intimacy as we age, and safety during sexual activity.   CARDIAC REHAB PHASE II EXERCISE from  06/14/2016 in [place]  Date  05/10/16  Educator  DC  Instruction Review Code  2- meets goals/outcomes      Smoking Cessation / COPD -Discuss different methods to quit smoking, the health benefits of quitting smoking, and the definition of COPD.   CARDIAC REHAB PHASE II EXERCISE from 06/14/2016 in [place]  Date  05/17/16  Educator  DC  Instruction Review Code  2- meets goals/outcomes      Nutrition I: Fats -Discuss the types of cholesterol, what cholesterol does to the heart, and how cholesterol levels can be controlled.   Nutrition II: Labels -Discuss the different components of food labels and how to read food label   CARDIAC REHAB PHASE II EXERCISE from 06/14/2016 in [place]  Date  05/31/16  Educator  DC  Instruction Review Code  2- meets goals/outcomes      Heart Parts and Heart Disease -Discuss the anatomy of the heart, the pathway of blood circulation through the heart, and these are affected by heart disease.   CARDIAC REHAB PHASE II EXERCISE from 06/14/2016 in [place]  Date  06/07/16  Educator  DJ  Instruction Review Code  2- meets goals/outcomes      Stress I: Signs and Symptoms -Discuss the causes of stress, how stress may lead to anxiety and depression, and ways to limit stress.   CARDIAC REHAB PHASE II EXERCISE from 06/14/2016 in [place]  Date  06/14/16  Educator  D. Coad  Instruction Review Code  2- meets goals/outcomes      Stress II: Relaxation -Discuss different types of relaxation techniques to limit stress.   Warning Signs of Stroke / TIA -Discuss definition of a stroke, what the signs and symptoms are of a stroke, and how to identify when someone is having stroke.   Knowledge Questionnaire Score:     Knowledge Questionnaire Score - 04/13/16 1449      Knowledge Questionnaire Score   Pre Score 21/24      Core  Components/Risk Factors/Patient Goals at Admission:     Personal Goals and Risk Factors at Admission - 04/13/16 1451      Core Components/Risk Factors/Patient Goals on Admission    Weight Management Obesity;Yes   Admit Weight 212 lb (96.2 kg)   Goal Weight: Short Term 202 lb (91.6 kg)   Goal Weight: Long Term 187 lb (84.8 kg)   Expected Outcomes Short Term: Continue to assess and modify interventions until short term weight is achieved   Increase Strength and Stamina Yes   Intervention Provide advice, education, support and counseling about physical activity/exercise needs.;Develop an individualized exercise prescription for aerobic and resistive training based on initial evaluation findings, risk stratification, comorbidities and participant's personal goals.   Expected Outcomes Achievement of increased cardiorespiratory fitness and enhanced flexibility, muscular endurance and strength shown through measurements of functional capacity and personal statement of participant.   Personal Goal Other Yes   Personal Goal Lose 25 lbs at graduation. Lose 2 lbs/week.  Get stronger; be able to walk more.   Intervention Patient will attend cardiac rehab 3 days/week and supplement with exercise at home 2 days/week.    Expected Outcomes Patient will meet his personal goal.       Core Components/Risk Factors/Patient Goals Review:      Goals and Risk Factor Review    Row Name 04/13/16 1455 05/09/16 0737 06/07/16 1430 06/28/16 1411       Core Components/Risk Factors/Patient Goals Review   Personal Goals Review Weight Management/Obesity;Increase Strength and Stamina Weight Management/Obesity;Hypertension  Get stronger; lose 25 lbs; get back to walking again.  Weight Management/Obesity  Get stronger; lose 25 lbs; get back to walking again.  Weight Management/Obesity;Hypertension    Review  - Patient has completed 9 sessions. She has lost .2 lbs since starting the program. Her b/p continues to be  hypertensive. She says she feels better since starting the program. Will continue to monitor for progress.  Patient has completed 21 sessions gaining 1 lb. Her b/p readings have improved. She is progressing well in the program.  Patient has completed 26 sessions losing 1 lb. Her b/p readings are more WNL but continue to be slightly elevated.     Expected Outcomes  - Patient will complete the program meeting her personal goals.  Patient will complete the program meeting her personal goals.  Patient will complete the program with improved b/p readings and she will begin to lose weight.        Core Components/Risk Factors/Patient Goals at Discharge (Final Review):      Goals and Risk Factor Review - 06/28/16 1411      Core Components/Risk Factors/Patient Goals Review   Personal Goals Review Weight Management/Obesity;Hypertension   Review Patient has completed 26 sessions losing 1 lb. Her b/p readings are more WNL but continue to be slightly elevated.    Expected Outcomes Patient will complete the program with improved b/p readings and she will begin to lose weight.       ITP Comments:     ITP Comments    Row Name 04/13/16 1534 05/08/16 1504         ITP Comments Patient new to program. Plans to start 04/17/16. Patient met with Registered Dietitian to discuss nutrition topics including: Heart healthty eating, heart health cooking and make smart choices when shopping; Portion control; weight management; and hydration. Patient attended a group session with the hospital chaplian called [facility] to discuss and share how her recent cardiac diagnosis has effected her life.         Comments: ITP 30 Day REVIEW Patient progressing well in the program. Will continue to monitor for progress.

## 2016-06-30 ENCOUNTER — Encounter (HOSPITAL_COMMUNITY): Payer: Medicare Other

## 2016-07-03 ENCOUNTER — Encounter (HOSPITAL_COMMUNITY)
Admission: RE | Admit: 2016-07-03 | Discharge: 2016-07-03 | Disposition: A | Discharge status: Home / Self Care | Payer: Medicare Other | Source: Ambulatory Visit | Attending: Cardiology | Admitting: Cardiology

## 2016-07-03 DIAGNOSIS — I10 Essential (primary) hypertension: Secondary | ICD-10-CM | POA: Diagnosis not present

## 2016-07-03 DIAGNOSIS — Z952 Presence of prosthetic heart valve: Secondary | ICD-10-CM | POA: Diagnosis not present

## 2016-07-03 DIAGNOSIS — Z87891 Personal history of nicotine dependence: Secondary | ICD-10-CM | POA: Diagnosis not present

## 2016-07-03 DIAGNOSIS — R011 Cardiac murmur, unspecified: Secondary | ICD-10-CM | POA: Diagnosis not present

## 2016-07-03 NOTE — Progress Notes (Signed)
Daily Session Note  Patient Details  Name: ELENORE WANNINGER MRN: [id number] Date of Birth: October 18, 1945 Referring Provider:     CARDIAC REHAB PHASE II ORIENTATION from 04/13/2016 in [place]  Referring Provider  Dr. Harl Bowie      Encounter Date: 07/03/2016  Check In:     Session Check In - 07/03/16 1132      Check-In   Location [facility]   Staff Present Russella Dar, MS, EP, [facility], Exercise Physiologist;Hope Brandenburger Luther Parody, BS, EP, Exercise Physiologist   Supervising physician immediately available to respond to emergencies See telemetry face sheet for immediately available MD   Medication changes reported     No   Fall or balance concerns reported    No   Warm-up and Cool-down Performed as group-led instruction   Resistance Training Performed Yes   VAD Patient? No     Pain Assessment   Currently in Pain? No/denies   Pain Score 0-No pain   Multiple Pain Sites No      Capillary Blood Glucose: No results found for this or any previous visit (from the past 24 hour(s)).    History  Smoking Status  . Former Smoker  . Packs/day: 0.50  . Years: 5.00  . Types: Cigarettes  . Start date: 08/04/1968  . Quit date: 10/10/1973  Smokeless Tobacco  . Never Used    Comment: smoked less than 1 pack every 2 weeks. Havent smoked in atleast 40 years    Goals Met:  Independence with exercise equipment Exercise tolerated well No report of cardiac concerns or symptoms Strength training completed today  Goals Unmet:  Not Applicable  Comments: Check out 1200   Dr. Kate Sable is Medical Director for [place] and Pulmonary Rehab.

## 2016-07-05 ENCOUNTER — Encounter (HOSPITAL_COMMUNITY)
Admission: RE | Admit: 2016-07-05 | Discharge: 2016-07-05 | Disposition: A | Discharge status: Home / Self Care | Payer: Medicare Other | Source: Ambulatory Visit | Attending: Cardiology | Admitting: Cardiology

## 2016-07-05 DIAGNOSIS — Z952 Presence of prosthetic heart valve: Secondary | ICD-10-CM | POA: Diagnosis not present

## 2016-07-05 DIAGNOSIS — I10 Essential (primary) hypertension: Secondary | ICD-10-CM | POA: Diagnosis not present

## 2016-07-05 DIAGNOSIS — R011 Cardiac murmur, unspecified: Secondary | ICD-10-CM | POA: Insufficient documentation

## 2016-07-05 DIAGNOSIS — Z87891 Personal history of nicotine dependence: Secondary | ICD-10-CM | POA: Insufficient documentation

## 2016-07-05 NOTE — Progress Notes (Signed)
Daily Session Note  Patient Details  Name: Angela Dawson MRN: [id number] Date of Birth: 08-10-1945 Referring Provider:     CARDIAC REHAB PHASE II ORIENTATION from 04/13/2016 in [place]  Referring Provider  Dr. Harl Bowie      Encounter Date: 07/05/2016  Check In:     Session Check In - 07/05/16 1100      Check-In   Location [facility]   Staff Present Diane Angelina Pih, MS, EP, [facility], Exercise Physiologist;Zitlali Primm Wynetta Emery, RN, BSN   Supervising physician immediately available to respond to emergencies See telemetry face sheet for immediately available MD   Medication changes reported     No   Fall or balance concerns reported    No   Warm-up and Cool-down Performed as group-led instruction   Resistance Training Performed Yes   VAD Patient? No     Pain Assessment   Currently in Pain? No/denies   Pain Score 0-No pain   Multiple Pain Sites No      Capillary Blood Glucose: No results found for this or any previous visit (from the past 24 hour(s)).    History  Smoking Status  . Former Smoker  . Packs/day: 0.50  . Years: 5.00  . Types: Cigarettes  . Start date: 08/04/1968  . Quit date: 10/10/1973  Smokeless Tobacco  . Never Used    Comment: smoked less than 1 pack every 2 weeks. Havent smoked in atleast 40 years    Goals Met:  Independence with exercise equipment Exercise tolerated well No report of cardiac concerns or symptoms Strength training completed today  Goals Unmet:  Not Applicable  Comments: Check out 1200.   Dr. Kate Sable is Medical Director for [facility] Cardiac and Pulmonary Rehab.

## 2016-07-07 ENCOUNTER — Encounter (HOSPITAL_COMMUNITY)
Admission: RE | Admit: 2016-07-07 | Discharge: 2016-07-07 | Disposition: A | Discharge status: Home / Self Care | Payer: Medicare Other | Source: Ambulatory Visit | Attending: Cardiology | Admitting: Cardiology

## 2016-07-07 DIAGNOSIS — Z952 Presence of prosthetic heart valve: Secondary | ICD-10-CM | POA: Diagnosis not present

## 2016-07-07 DIAGNOSIS — R011 Cardiac murmur, unspecified: Secondary | ICD-10-CM | POA: Diagnosis not present

## 2016-07-07 DIAGNOSIS — Z87891 Personal history of nicotine dependence: Secondary | ICD-10-CM | POA: Diagnosis not present

## 2016-07-07 DIAGNOSIS — I10 Essential (primary) hypertension: Secondary | ICD-10-CM | POA: Diagnosis not present

## 2016-07-07 NOTE — Progress Notes (Signed)
Daily Session Note  Patient Details  Name: Angela Dawson MRN: [id number] Date of Birth: 01-Nov-1945 Referring Provider:     CARDIAC REHAB PHASE II ORIENTATION from 04/13/2016 in [place]  Referring Provider  Dr. Harl Bowie      Encounter Date: 07/07/2016  Check In:     Session Check In - 07/07/16 1100      Check-In   Location [facility]   Staff Present Polly Barner Angelina Pih, MS, EP, [facility], Exercise Physiologist;Debra Wynetta Emery, RN, BSN   Supervising physician immediately available to respond to emergencies See telemetry face sheet for immediately available MD   Medication changes reported     No   Fall or balance concerns reported    No   Tobacco Cessation No Change   Warm-up and Cool-down Performed as group-led instruction   Resistance Training Performed Yes   VAD Patient? No     Pain Assessment   Currently in Pain? No/denies   Pain Score 0-No pain   Multiple Pain Sites No      Capillary Blood Glucose: No results found for this or any previous visit (from the past 24 hour(s)).    History  Smoking Status  . Former Smoker  . Packs/day: 0.50  . Years: 5.00  . Types: Cigarettes  . Start date: 08/04/1968  . Quit date: 10/10/1973  Smokeless Tobacco  . Never Used    Comment: smoked less than 1 pack every 2 weeks. Havent smoked in atleast 40 years    Goals Met:  Independence with exercise equipment Exercise tolerated well No report of cardiac concerns or symptoms Strength training completed today  Goals Unmet:  Not Applicable  Comments: Check out: 1200   Dr. Kate Sable is Medical Director for [place] and Pulmonary Rehab.

## 2016-07-10 ENCOUNTER — Encounter (HOSPITAL_COMMUNITY)
Admission: RE | Admit: 2016-07-10 | Discharge: 2016-07-10 | Disposition: A | Discharge status: Home / Self Care | Payer: Medicare Other | Source: Ambulatory Visit | Attending: Cardiology | Admitting: Cardiology

## 2016-07-10 DIAGNOSIS — Z952 Presence of prosthetic heart valve: Secondary | ICD-10-CM | POA: Diagnosis not present

## 2016-07-10 DIAGNOSIS — I10 Essential (primary) hypertension: Secondary | ICD-10-CM | POA: Diagnosis not present

## 2016-07-10 DIAGNOSIS — R011 Cardiac murmur, unspecified: Secondary | ICD-10-CM | POA: Diagnosis not present

## 2016-07-10 DIAGNOSIS — Z87891 Personal history of nicotine dependence: Secondary | ICD-10-CM | POA: Diagnosis not present

## 2016-07-12 ENCOUNTER — Encounter (HOSPITAL_COMMUNITY)
Admission: RE | Admit: 2016-07-12 | Discharge: 2016-07-12 | Disposition: A | Discharge status: Home / Self Care | Payer: Medicare Other | Source: Ambulatory Visit | Attending: Cardiology | Admitting: Cardiology

## 2016-07-12 DIAGNOSIS — Z952 Presence of prosthetic heart valve: Secondary | ICD-10-CM | POA: Diagnosis not present

## 2016-07-12 DIAGNOSIS — R011 Cardiac murmur, unspecified: Secondary | ICD-10-CM | POA: Diagnosis not present

## 2016-07-12 DIAGNOSIS — Z87891 Personal history of nicotine dependence: Secondary | ICD-10-CM | POA: Diagnosis not present

## 2016-07-12 DIAGNOSIS — I10 Essential (primary) hypertension: Secondary | ICD-10-CM | POA: Diagnosis not present

## 2016-07-12 NOTE — Progress Notes (Signed)
Daily Session Note  Patient Details  Name: Angela Dawson MRN: [id number] Date of Birth: 12/05/1945 Referring Provider:     CARDIAC REHAB PHASE II ORIENTATION from 04/13/2016 in [place]  Referring Provider  Dr. Harl Bowie      Encounter Date: 07/12/2016  Check In:     Session Check In - 07/12/16 1117      Check-In   Location [facility]   Staff Present Aundra Dubin, RN, BSN;Michelene Keniston Luther Parody, BS, EP, Exercise Physiologist   Supervising physician immediately available to respond to emergencies See telemetry face sheet for immediately available MD   Medication changes reported     No   Fall or balance concerns reported    No   Warm-up and Cool-down Performed as group-led instruction   Resistance Training Performed Yes   VAD Patient? No     Pain Assessment   Currently in Pain? No/denies   Pain Score 0-No pain   Multiple Pain Sites No      Capillary Blood Glucose: No results found for this or any previous visit (from the past 24 hour(s)).    History  Smoking Status  . Former Smoker  . Packs/day: 0.50  . Years: 5.00  . Types: Cigarettes  . Start date: 08/04/1968  . Quit date: 10/10/1973  Smokeless Tobacco  . Never Used    Comment: smoked less than 1 pack every 2 weeks. Havent smoked in atleast 40 years    Goals Met:  Independence with exercise equipment Exercise tolerated well No report of cardiac concerns or symptoms Strength training completed today  Goals Unmet:  Not Applicable  Comments: Check out 1200   Dr. Kate Sable is Medical Director for [place] and Pulmonary Rehab.

## 2016-07-13 ENCOUNTER — Other Ambulatory Visit: Payer: Self-pay | Admitting: Nurse Practitioner

## 2016-07-14 ENCOUNTER — Encounter (HOSPITAL_COMMUNITY)
Admission: RE | Admit: 2016-07-14 | Discharge: 2016-07-14 | Disposition: A | Discharge status: Home / Self Care | Payer: Medicare Other | Source: Ambulatory Visit | Attending: Cardiology | Admitting: Cardiology

## 2016-07-14 DIAGNOSIS — Z87891 Personal history of nicotine dependence: Secondary | ICD-10-CM | POA: Diagnosis not present

## 2016-07-14 DIAGNOSIS — Z952 Presence of prosthetic heart valve: Secondary | ICD-10-CM | POA: Diagnosis not present

## 2016-07-14 DIAGNOSIS — I10 Essential (primary) hypertension: Secondary | ICD-10-CM | POA: Diagnosis not present

## 2016-07-14 DIAGNOSIS — R011 Cardiac murmur, unspecified: Secondary | ICD-10-CM | POA: Diagnosis not present

## 2016-07-14 NOTE — Progress Notes (Signed)
Daily Session Note  Patient Details  Name: Angela Dawson MRN: [id number] Date of Birth: Apr 14, 1945 Referring Provider:     CARDIAC REHAB PHASE II ORIENTATION from 04/13/2016 in [place]  Referring Provider  Dr. Harl Bowie      Encounter Date: 07/14/2016  Check In:     Session Check In - 07/14/16 1100      Check-In   Location [facility]   Staff Present Suzanne Boron, BS, EP, Exercise Physiologist;Javier Mamone Wynetta Emery, RN, BSN   Supervising physician immediately available to respond to emergencies See telemetry face sheet for immediately available MD   Medication changes reported     No   Fall or balance concerns reported    No   Warm-up and Cool-down Performed as group-led instruction   Resistance Training Performed Yes   VAD Patient? No     Pain Assessment   Currently in Pain? No/denies   Pain Score 0-No pain   Multiple Pain Sites No      Capillary Blood Glucose: No results found for this or any previous visit (from the past 24 hour(s)).    History  Smoking Status  . Former Smoker  . Packs/day: 0.50  . Years: 5.00  . Types: Cigarettes  . Start date: 08/04/1968  . Quit date: 10/10/1973  Smokeless Tobacco  . Never Used    Comment: smoked less than 1 pack every 2 weeks. Havent smoked in atleast 40 years    Goals Met:  Independence with exercise equipment Exercise tolerated well No report of cardiac concerns or symptoms Strength training completed today  Goals Unmet:  Not Applicable  Comments: Check out 1200.   Dr. Kate Sable is Medical Director for [facility] and [facility] Re[facility].

## 2016-07-17 ENCOUNTER — Encounter (HOSPITAL_COMMUNITY)
Admission: RE | Admit: 2016-07-17 | Discharge: 2016-07-17 | Disposition: A | Discharge status: Home / Self Care | Payer: Medicare Other | Source: Ambulatory Visit | Attending: Cardiology | Admitting: Cardiology

## 2016-07-17 DIAGNOSIS — R011 Cardiac murmur, unspecified: Secondary | ICD-10-CM | POA: Diagnosis not present

## 2016-07-17 DIAGNOSIS — I10 Essential (primary) hypertension: Secondary | ICD-10-CM | POA: Diagnosis not present

## 2016-07-17 DIAGNOSIS — Z952 Presence of prosthetic heart valve: Secondary | ICD-10-CM | POA: Diagnosis not present

## 2016-07-17 DIAGNOSIS — Z87891 Personal history of nicotine dependence: Secondary | ICD-10-CM | POA: Diagnosis not present

## 2016-07-19 ENCOUNTER — Encounter (HOSPITAL_COMMUNITY)
Admission: RE | Admit: 2016-07-19 | Discharge: 2016-07-19 | Disposition: A | Discharge status: Home / Self Care | Payer: Medicare Other | Source: Ambulatory Visit | Attending: Cardiology | Admitting: Cardiology

## 2016-07-19 DIAGNOSIS — Z952 Presence of prosthetic heart valve: Secondary | ICD-10-CM | POA: Diagnosis not present

## 2016-07-19 DIAGNOSIS — Z87891 Personal history of nicotine dependence: Secondary | ICD-10-CM | POA: Diagnosis not present

## 2016-07-19 DIAGNOSIS — R011 Cardiac murmur, unspecified: Secondary | ICD-10-CM | POA: Diagnosis not present

## 2016-07-19 DIAGNOSIS — I10 Essential (primary) hypertension: Secondary | ICD-10-CM | POA: Diagnosis not present

## 2016-07-19 NOTE — Progress Notes (Signed)
Daily Session Note  Patient Details  Name: Angela Dawson MRN: [id number] Date of Birth: [birth year] Referring Provider:     CARDIAC REHAB PHASE II ORIENTATION from 04/13/2016 in [place]  Referring Provider  Dr. Harl Bowie      Encounter Date: 07/19/2016  Check In:     Session Check In - 07/19/16 1116      Check-In   Location [facility]   Staff Present Suzanne Boron, BS, EP, Exercise Physiologist;Debra Wynetta Emery, RN, BSN   Supervising physician immediately available to respond to emergencies See telemetry face sheet for immediately available MD   Medication changes reported     No   Fall or balance concerns reported    No   Warm-up and Cool-down Performed as group-led instruction   Resistance Training Performed Yes   VAD Patient? No     Pain Assessment   Currently in Pain? No/denies   Pain Score 0-No pain   Multiple Pain Sites No      Capillary Blood Glucose: No results found for this or any previous visit (from the past 24 hour(s)).    History  Smoking Status  . Former Smoker  . Packs/day: 0.50  . Years: 5.00  . Types: Cigarettes  . Start date: 08/04/1968  . Quit date: 10/10/1973  Smokeless Tobacco  . Never Used    Comment: smoked less than 1 pack every 2 weeks. Havent smoked in atleast 40 years    Goals Met:  Independence with exercise equipment Exercise tolerated well No report of cardiac concerns or symptoms Strength training completed today  Goals Unmet:  Not Applicable  Comments: Check out 1200   Dr. Kate Sable is Medical Director for [place] and Pulmonary Rehab.

## 2016-07-19 NOTE — Progress Notes (Signed)
Cardiac Individual Treatment Plan  Patient Details  Name: Angela Dawson MRN: [id number] Date of Birth: April 16, 1945 Referring Provider:     CARDIAC REHAB PHASE II ORIENTATION from 04/13/2016 in [place]  Referring Provider  Dr. Harl Bowie      Initial Encounter Date:    CARDIAC REHAB PHASE II ORIENTATION from 04/13/2016 in [place]  Date  04/13/16  Referring Provider  Dr. Harl Bowie      Visit Diagnosis: S/P AVR (aortic valve replacement)  Patient's Home Medications on Admission:  Current Outpatient Prescriptions:  .  acetaminophen (TYLENOL) 325 MG tablet, Take 2 tablets (650 mg total) by mouth every 6 (six) hours as needed for mild pain., Disp: , Rfl:  .  aspirin EC 325 MG EC tablet, Take 1 tablet (325 mg total) by mouth daily., Disp: 30 tablet, Rfl: 0 .  azithromycin (ZITHROMAX) 500 MG tablet, TAKE 1 TABLET 1 HOUR PRIOR TO DENTAL PROCEDURE, Disp: 1 tablet, Rfl: 0 .  cetirizine (ZYRTEC) 10 MG tablet, TAKE 1 TABLET EVERY DAY, Disp: 90 tablet, Rfl: 1 .  lisinopril (PRINIVIL,ZESTRIL) 10 MG tablet, Take 1 tablet (10 mg total) by mouth daily., Disp: 90 tablet, Rfl: 3 .  metoprolol tartrate (LOPRESSOR) 25 MG tablet, Take 0.5 tablets (12.5 mg total) by mouth 2 (two) times daily., Disp: 90 tablet, Rfl: 3  Past Medical History: Past Medical History:  Diagnosis Date  . Allergy    seasonal  . Chicken pox   . Complication of anesthesia    difficult to wake up after tubal ligations  . Dyspnea    with exertion  . Heart murmur   . History of bronchitis   . History of kidney stones    "only one"  . Hypertension   . Measles     Tobacco Use: History  Smoking Status  . Former Smoker  . Packs/day: 0.50  . Years: 5.00  . Types: Cigarettes  . Start date: 08/04/1968  . Quit date: 10/10/1973  Smokeless Tobacco  . Never Used    Comment: smoked less than 1 pack every 2 weeks. Havent smoked in atleast 40 years    Labs: Recent Review Flowsheet Data     Labs for ITP Cardiac and Pulmonary Rehab Latest Ref Rng & Units 01/18/2016 01/18/2016 01/18/2016 01/18/2016 02/29/2016   Cholestrol 100 - 199 mg/dL - - - - 188   LDLCALC 0 - 99 mg/dL - - - - 120(H)   HDL >39 mg/dL - - - - 43   Trlycerides 0 - 149 mg/dL - - - - 127   Hemoglobin A1c 4.8 - 5.6 % - - - - -   PHART 7.350 - 7.450 7.495(H) 7.375 7.394 - -   PCO2ART 32.0 - 48.0 mmHg 33.2 40.7 38.6 - -   HCO3 20.0 - 28.0 mmol/L 25.4 23.7 23.5 - -   TCO2 0 - 100 mmol/L _0 -   ACIDBASEDEF 0.0 - 2.0 mmol/L - 1.0 1.0 - -   O2SAT % 99.0 97.0 96.0 - -      Capillary Blood Glucose: Lab Results  Component Value Date   GLUCAP 100 (H) 01/22/2016   GLUCAP 109 (H) 01/22/2016   GLUCAP 103 (H) 01/22/2016   GLUCAP 101 (H) 01/21/2016   GLUCAP 115 (H) 01/21/2016     Exercise Target Goals:    Exercise Program Goal: Individual exercise prescription set with THRR, safety & activity barriers. Participant demonstrates ability to understand and report RPE using BORG  scale, to self-measure pulse accurately, and to acknowledge the importance of the exercise prescription.  Exercise Prescription Goal: Starting with aerobic activity 30 plus minutes a day, 3 days per week for initial exercise prescription. Provide home exercise prescription and guidelines that participant acknowledges understanding prior to discharge.  Activity Barriers & Risk Stratification:     Activity Barriers & Cardiac Risk Stratification - 04/13/16 1448      Activity Barriers & Cardiac Risk Stratification   Activity Barriers None   Cardiac Risk Stratification High      6 Minute Walk:     6 Minute Walk    Row Name 04/13/16 1443         6 Minute Walk   Phase Initial     Distance 1500 feet     Distance % Change 0 %     Walk Time 6 minutes     # of Rest Breaks 0     MPH 2.84     METS 3.17     RPE 13     Perceived Dyspnea  14     VO2 Peak 11.78     Symptoms No     Resting HR 74 bpm     Resting BP 158/80      Max Ex. HR 124 bpm     Max Ex. BP 198/100     2 Minute Post BP 162/92        Oxygen Initial Assessment:   Oxygen Re-Evaluation:   Oxygen Discharge (Final Oxygen Re-Evaluation):   Initial Exercise Prescription:     Initial Exercise Prescription - 04/13/16 1200      Date of Initial Exercise RX and Referring Provider   Date 04/13/16   Referring Provider Dr. Harl Bowie     NuStep   Level 2   SPM 15   Minutes 15   METs 1.9     Arm Ergometer   Level 2   Watts 20   Minutes 20   METs 2.8     Prescription Details   Frequency (times per week) 3   Duration Progress to 30 minutes of continuous aerobic without signs/symptoms of physical distress     Intensity   THRR REST +  30   THRR 40-80% of Max Heartrate 105-120-136   Ratings of Perceived Exertion 11-13   Perceived Dyspnea 0-4     Progression   Progression Continue progressive overload as per policy without signs/symptoms or physical distress.     Resistance Training   Training Prescription Yes   Weight 1   Reps 10-12      Perform Capillary Blood Glucose checks as needed.  Exercise Prescription Changes:      Exercise Prescription Changes    Row Name 05/08/16 0800 05/24/16 1000 06/05/16 1300 06/27/16 0800 07/19/16 1400     Response to Exercise   Blood Pressure (Admit) 168/78 138/76 150/82 144/76 120/70   Blood Pressure (Exercise) 184/80 150/84 170/70 283/82 166/78   Blood Pressure (Exit) 138/78 130/72 140/66 138/72 130/70   Heart Rate (Admit) 62 bpm 63 bpm 61 bpm 69 bpm 62 bpm   Heart Rate (Exercise) 99 bpm 94 bpm 104 bpm 111 bpm 93 bpm   Heart Rate (Exit) 71 bpm 70 bpm 72 bpm 75 bpm 71 bpm   Rating of Perceived Exertion (Exercise) _0 Duration Progress to 30 minutes of  aerobic without signs/symptoms of physical distress Progress to 30 minutes of  aerobic without signs/symptoms of physical distress  Progress to 30 minutes of  aerobic without signs/symptoms of physical distress Progress to 30  minutes of  aerobic without signs/symptoms of physical distress Progress to 30 minutes of  aerobic without signs/symptoms of physical distress   Intensity _0      Resistance Training   Training Prescription _1    Weight _2 Reps 10-15 10-15 10-15 10-15 10-15     NuStep   Level _3 SPM 21 33 28 32 42   Minutes _4 METs 3.65 3.65 3.66 3.66 3.66     Arm Ergometer   Level 2.5 2.5 2._5 Watts 31 37 40 40 39   Minutes _6 METs 3.4 3.9 4.1 4.1 3.96     Home Exercise Plan   Plans to continue exercise at Home (comment) Home (comment) Home (comment) Home (comment) Home (comment)   Frequency Add 2 additional days to program exercise sessions. Add 2 additional days to program exercise sessions. Add 2 additional days to program exercise sessions. Add 2 additional days to program exercise sessions. Add 2 additional days to program exercise sessions.      Exercise Comments:      Exercise Comments    Row Name 05/08/16 0840 05/24/16 1050 06/05/16 1333 06/27/16 0822 07/19/16 1439   Exercise Comments Patient is progresssing well and it doing exceptional on the Arm ergometer Patient is progressing well in both the arm crank and nustep.  Patient is progressing very well especially on the arm crank and she is maintaining her level and watts on the nustep Patient is doing very well in Cr especially on the arm ergometer Patient is progressing very well in CR.       Exercise Goals and Review:   Exercise Goals Re-Evaluation :     Exercise Goals Re-Evaluation    Row Name 05/09/16 0740 06/07/16 1432 06/28/16 1413 07/19/16 1639       Exercise Goal Re-Evaluation   Exercise Goals Review Increase Strenth and Stamina;Increase Physical Activity Increase Physical Activity;Increase Strenth and Stamina Increase Physical Activity;Increase Strenth and Stamina  Get back to  walking again. Increase Physical Activity;Increase Strenth and Stamina  Get stronger; get back to walking again.    Comments Patient says her strength and stamina have increased. She is able to do more around the house with less fatigue.  After completing 21 sessions, patient's strength, stamina and activity have increased. She says she feels much better and is able to do things around the house she has not done in a long time.  After completing 26 sessions, patient says she feels stronger and much better overall. She says she is able to do things she has not felt like doing in a long time. She says the program is really benefiting her.  Patient has completed 34 sessions. She has progressed well in the program with increased strength, stamina and activity. She says she is doing more around the house than she ever has and feels amazing.     Expected Outcomes Patient will continue to progress in the program.  Patient will complete the program with continued strength, stamina and activity. Patient will complete the program with continued increased strength, staimna, and activity.  Patient will graduate with continued increased strength, stamina, and activity.        Discharge  Exercise Prescription (Final Exercise Prescription Changes):     Exercise Prescription Changes - 07/19/16 1400      Response to Exercise   Blood Pressure (Admit) 120/70   Blood Pressure (Exercise) 166/78   Blood Pressure (Exit) 130/70   Heart Rate (Admit) 62 bpm   Heart Rate (Exercise) 93 bpm   Heart Rate (Exit) 71 bpm   Rating of Perceived Exertion (Exercise) 11   Duration Progress to 30 minutes of  aerobic without signs/symptoms of physical distress   Intensity THRR unchanged     Resistance Training   Training Prescription Yes   Weight 3   Reps 10-15     NuStep   Level 4   SPM 42   Minutes 20   METs 3.66     Arm Ergometer   Level 3   Watts 39   Minutes 15   METs 3.96     Home Exercise Plan   Plans to  continue exercise at Home (comment)   Frequency Add 2 additional days to program exercise sessions.      Nutrition:  Target Goals: Understanding of nutrition guidelines, daily intake of sodium <1564m, cholesterol <2080m calories 30% from fat and 7% or less from saturated fats, daily to have 5 or more servings of fruits and vegetables.  Biometrics:     Pre Biometrics - 04/13/16 1253      Pre Biometrics   Height 5' (1.524 m)   Weight 212 lb 1.3 oz (96.2 kg)   Waist Circumference 41 inches   Hip Circumference 47 inches   Waist to Hip Ratio 0.87 %   BMI (Calculated) 41.5   Triceps Skinfold 30 mm   % Body Fat 50 %   Grip Strength 50 kg   Flexibility 0 in   Single Leg Stand 5 seconds       Nutrition Therapy Plan and Nutrition Goals:   Nutrition Discharge: Rate Your Plate Scores:     Nutrition Assessments - 04/13/16 1450      MEDFICTS Scores   Pre Score 30      Nutrition Goals Re-Evaluation:   Nutrition Goals Discharge (Final Nutrition Goals Re-Evaluation):   Psychosocial: Target Goals: Acknowledge presence or absence of significant depression and/or stress, maximize coping skills, provide positive support system. Participant is able to verbalize types and ability to use techniques and skills needed for reducing stress and depression.  Initial Review & Psychosocial Screening:     Initial Psych Review & Screening - [place]13/16 [place]     Barriers   Psychosocial barriers to participate in program There are no identifiable barriers or psychosocial needs.     Screening Interventions   Interventions Encouraged to exercise      Quality of Life Scores:     Quality of Life - 04/13/16 1253      Quality of Life Scores   Health/Function Pre 29.54 %   Socioeconomic Pre 29.14 %   Psych/Spiritual Pre 29.14 %   Family Pre 30 %   GLOBAL Pre 29.42 %      PHQ-9: Recent Review Flowsheet Data    [facility] 04/13/2016 02/29/2016 11/30/2015 10/11/2015 08/30/2015   Decreased Interest 0 0 0 0 0   Down, Depressed, Hopeless 0 0 0 0 0   PHQ - 2 Score 0 0 0 0 0   Altered sleeping 0 - - - -   Tired, decreased energy 0 - - - -  Change in appetite 0 - - - -   Feeling bad or failure about yourself  0 - - - -   Trouble concentrating 0 - - - -   Moving slowly or fidgety/restless 0 - - - -   Suicidal thoughts 0 - - - -   PHQ-9 Score 0 - - - -     Interpretation of Total Score  Total Score Depression Severity:  1-4 = Minimal depression, 5-9 = Mild depression, 10-14 = Moderate depression, 15-19 = Moderately severe depression, 20-27 = Severe depression   Psychosocial Evaluation and Intervention:     Psychosocial Evaluation - 04/13/16 1456      Psychosocial Evaluation & Interventions   Interventions Encouraged to exercise with the program and follow exercise prescription   Comments Patient has no psychosocial barriers identified at orientation.    Continue Psychosocial Services  No      Psychosocial Re-Evaluation:     Psychosocial Re-Evaluation    [place] Name 05/09/16 [phone] 06/07/16 1435 06/28/16 1414 07/19/16 1642       Psychosocial Re-Evaluation   Current issues with None Identified None Identified None Identified None Identified    Comments Patient's QOL score was 29.42 and her PHQ-9 score was 0.  -  -  -    Expected Outcomes  - Patient will have no psychosocial barriers identified at discharge.  Patient will have no psychosocial issues identified at [place].  Patient will have no psychosocial barriers identified at discharge.     Interventions  - Encouraged to attend Cardiac Rehabilitation for the exercise Encouraged to attend Cardiac Rehabilitation for the exercise Encouraged to attend Cardiac Rehabilitation for the exercise    Continue Psychosocial Services  No Follow up required No Follow up required No Follow up required No Follow up required       Psychosocial Discharge (Final  Psychosocial Re-Evaluation):     Psychosocial Re-Evaluation - 07/19/16 1642      Psychosocial Re-Evaluation   Current issues with None Identified   Expected Outcomes Patient will have no psychosocial barriers identified at discharge.    Interventions Encouraged to attend Cardiac Rehabilitation for the exercise   Continue Psychosocial Services  No Follow up required      Vocational Rehabilitation: Provide vocational rehab assistance to qualifying candidates.   Vocational Rehab Evaluation & Intervention:     Vocational Rehab - 04/13/16 1450      Initial Vocational Rehab Evaluation & Intervention   Assessment shows need for Vocational Rehabilitation No      Education: Education Goals: Education classes will be provided on a weekly basis, covering required topics. Participant will state understanding/return demonstration of topics presented.  Learning Barriers/Preferences:     Learning Barriers/Preferences - 04/13/16 1449      Learning Barriers/Preferences   Learning Barriers None   Learning Preferences Skilled Demonstration      Education Topics: Hypertension, Hypertension Reduction -Define heart disease and high blood pressure. Discus how high blood pressure affects the body and ways to reduce high blood pressure.   CARDIAC REHAB PHASE II EXERCISE from 07/19/2016 in [place]  Date  (P) 07/05/16  Educator  (P) Russella Dar  Instruction Review Code  (P) 2- meets goals/outcomes      Exercise and Your Heart -Discuss why it is important to exercise, the FITT principles of exercise, normal and abnormal responses to exercise, and how to exercise safely.   CARDIAC REHAB PHASE II EXERCISE from 07/19/2016 in [place]  Date  (P) 07/12/16  Educator  (P) DC  Instruction Review Code  (P) 2- meets goals/outcomes      Angina -Discuss definition of angina, causes of angina, treatment of angina, and how to decrease risk of having  angina.   CARDIAC REHAB PHASE II EXERCISE from 07/19/2016 in [place]  Date  (P) 04/19/16  Educator  (P) DC  Instruction Review Code  (P) 2- meets goals/outcomes      Cardiac Medications -Review what the following cardiac medications are used for, how they affect the body, and side effects that may occur when taking the medications.  Medications include Aspirin, Beta blockers, calcium channel blockers, ACE Inhibitors, angiotensin receptor blockers, diuretics, digoxin, and antihyperlipidemics.   CARDIAC REHAB PHASE II EXERCISE from 07/19/2016 in [place]  Date  (P) 04/26/16  Educator  (P) DC  Instruction Review Code  (P) 2- meets goals/outcomes      Congestive Heart Failure -Discuss the definition of CHF, how to live with CHF, the signs and symptoms of CHF, and how keep track of weight and sodium intake.   CARDIAC REHAB PHASE II EXERCISE from 07/19/2016 in [place]  Date  (P) 05/03/16  Educator  (P) DC  Instruction Review Code  (P) 2- meets goals/outcomes      Heart Disease and Intimacy -Discus the effect sexual activity has on the heart, how changes occur during intimacy as we age, and safety during sexual activity.   CARDIAC REHAB PHASE II EXERCISE from 07/19/2016 in [place]  Date  (P) 05/10/16  Educator  (P) DC  Instruction Review Code  (P) 2- meets goals/outcomes      Smoking Cessation / COPD -Discuss different methods to quit smoking, the health benefits of quitting smoking, and the definition of COPD.   CARDIAC REHAB PHASE II EXERCISE from 07/19/2016 in [place]  Date  (P) 05/17/16  Educator  (P) DC  Instruction Review Code  (P) 2- meets goals/outcomes      Nutrition I: Fats -Discuss the types of cholesterol, what cholesterol does to the heart, and how cholesterol levels can be controlled.   Nutrition II: Labels -Discuss the different  components of food labels and how to read food label   [place] from 07/19/2016 in [place]  Date  (P) 05/31/16  Educator  (P) DC  Instruction Review Code  (P) 2- meets goals/outcomes      Heart Parts and Heart Disease -Discuss the anatomy of the heart, the pathway of blood circulation through the heart, and these are affected by heart disease.   CARDIAC REHAB PHASE II EXERCISE from 07/19/2016 in [place]  Date  (P) 06/07/16  Educator  (P) DJ  Instruction Review Code  (P) 2- meets goals/outcomes      Stress I: Signs and Symptoms -Discuss the causes of stress, how stress may lead to anxiety and depression, and ways to limit stress.   CARDIAC REHAB PHASE II EXERCISE from 07/19/2016 in [place]  Date  (P) 06/14/16  Educator  (P) D. Coad  Instruction Review Code  (P) 2- meets goals/outcomes      Stress II: Relaxation -Discuss different types of relaxation techniques to limit stress.   Warning Signs of Stroke / TIA -Discuss definition of a stroke, what the signs and symptoms are of a stroke, and how to identify when someone is having stroke.  Knowledge Questionnaire Score:     Knowledge Questionnaire Score - 04/13/16 1449      Knowledge Questionnaire Score   Pre Score 21/24      Core Components/Risk Factors/Patient Goals at Admission:     Personal Goals and Risk Factors at Admission - 04/13/16 1451      Core Components/Risk Factors/Patient Goals on Admission    Weight Management Obesity;Yes   Admit Weight 212 lb (96.2 kg)   Goal Weight: Short Term 202 lb (91.6 kg)   Goal Weight: Long Term 187 lb (84.8 kg)   Expected Outcomes Short Term: Continue to assess and modify interventions until short term weight is achieved   Increase Strength and Stamina Yes   Intervention Provide advice, education, support and counseling about physical activity/exercise needs.;Develop an  individualized exercise prescription for aerobic and resistive training based on initial evaluation findings, risk stratification, comorbidities and participant's personal goals.   Expected Outcomes Achievement of increased cardiorespiratory fitness and enhanced flexibility, muscular endurance and strength shown through measurements of functional capacity and personal statement of participant.   Personal Goal Other Yes   Personal Goal Lose 25 lbs at graduation. Lose 2 lbs/week. Get stronger; be able to walk more.   Intervention Patient will attend cardiac rehab 3 days/week and supplement with exercise at home 2 days/week.    Expected Outcomes Patient will meet his personal goal.       Core Components/Risk Factors/Patient Goals Review:      Goals and Risk Factor Review    Row Name 04/13/16 1455 05/09/16 0737 06/07/16 1430 06/28/16 1411 07/19/16 1637     Core Components/Risk Factors/Patient Goals Review   Personal Goals Review Weight Management/Obesity;Increase Strength and Stamina Weight Management/Obesity;Hypertension  Get stronger; lose 25 lbs; get back to walking again.  Weight Management/Obesity  Get stronger; lose 25 lbs; get back to walking again.  Weight Management/Obesity;Hypertension Weight Management/Obesity;Hypertension   Review  - Patient has completed 9 sessions. She has lost .2 lbs since starting the program. Her b/p continues to be hypertensive. She says she feels better since starting the program. Will continue to monitor for progress.  Patient has completed 21 sessions gaining 1 lb. Her b/p readings have improved. She is progressing well in the program.  Patient has completed 26 sessions losing 1 lb. Her b/p readings are more WNL but continue to be slightly elevated.  Patient has completed 34 sessions maintaining her weight. Her b/p readings are lower than when she started. She says she feels much better and wants to join our [employer].    Expected Outcomes  - Patient  will complete the program meeting her personal goals.  Patient will complete the program meeting her personal goals.  Patient will complete the program with improved b/p readings and she will begin to lose weight.  Patient will graduate with continued b/p control maintaining her weight.       Core Components/Risk Factors/Patient Goals at Discharge (Final Review):      Goals and Risk Factor Review - 07/19/16 1637      Core Components/Risk Factors/Patient Goals Review   Personal Goals Review Weight Management/Obesity;Hypertension   Review Patient has completed 34 sessions maintaining her weight. Her b/p readings are lower than when she started. She says she feels much better and wants to join our [employer].    Expected Outcomes Patient will graduate with continued b/p control maintaining her weight.       ITP Comments:     ITP Comments  [place] Name 04/13/16 1534 05/08/16 1504         ITP Comments Patient new to program. Plans to start 04/17/16. Patient met with Registered Dietitian to discuss nutrition topics including: Heart healthty eating, heart health cooking and make smart choices when shopping; Portion control; weight management; and hydration. Patient attended a group session with the hospital chaplian called [facility] to discuss and share how her recent cardiac diagnosis has effected her life.         Comments: ITP 30 Day REVIEW Patient progressing well in the program. Will continue to monitor.

## 2016-07-21 ENCOUNTER — Encounter (HOSPITAL_COMMUNITY)
Admission: RE | Admit: 2016-07-21 | Discharge: 2016-07-21 | Disposition: A | Discharge status: Home / Self Care | Payer: Medicare Other | Source: Ambulatory Visit | Attending: Cardiology | Admitting: Cardiology

## 2016-07-21 DIAGNOSIS — R011 Cardiac murmur, unspecified: Secondary | ICD-10-CM | POA: Diagnosis not present

## 2016-07-21 DIAGNOSIS — Z952 Presence of prosthetic heart valve: Secondary | ICD-10-CM | POA: Diagnosis not present

## 2016-07-21 DIAGNOSIS — Z87891 Personal history of nicotine dependence: Secondary | ICD-10-CM | POA: Diagnosis not present

## 2016-07-21 DIAGNOSIS — I10 Essential (primary) hypertension: Secondary | ICD-10-CM | POA: Diagnosis not present

## 2016-07-21 NOTE — Progress Notes (Signed)
Daily Session Note  Patient Details  Name: Angela Dawson MRN: [id number] Date of Birth: 1945/05/01 Referring Provider:     CARDIAC REHAB PHASE II ORIENTATION from 04/13/2016 in [place]  Referring Provider  Dr. Harl Bowie      Encounter Date: 07/21/2016  Check In:     Session Check In - 07/21/16 1056      Check-In   Location [facility]   Staff Present Diane Angelina Pih, MS, EP, [facility], Exercise Physiologist;Westlyn Glaza Luther Parody, BS, EP, Exercise Physiologist   Supervising physician immediately available to respond to emergencies See telemetry face sheet for immediately available MD   Medication changes reported     No   Fall or balance concerns reported    No   Warm-up and Cool-down Performed as group-led instruction   Resistance Training Performed Yes   VAD Patient? No     Pain Assessment   Currently in Pain? No/denies   Pain Score 0-No pain   Multiple Pain Sites No      Capillary Blood Glucose: No results found for this or any previous visit (from the past 24 hour(s)).    History  Smoking Status  . Former Smoker  . Packs/day: 0.50  . Years: 5.00  . Types: Cigarettes  . Start date: 08/04/1968  . Quit date: 10/10/1973  Smokeless Tobacco  . Never Used    Comment: smoked less than 1 pack every 2 weeks. Havent smoked in atleast 40 years    Goals Met:  Independence with exercise equipment Exercise tolerated well No report of cardiac concerns or symptoms Strength training completed today  Goals Unmet:  Not Applicable  Comments: Check out 1200   Dr. Kate Sable is Medical Director for [place] and Pulmonary Rehab.

## 2016-07-24 ENCOUNTER — Encounter (HOSPITAL_COMMUNITY)
Admission: RE | Admit: 2016-07-24 | Discharge: 2016-07-24 | Disposition: A | Discharge status: Home / Self Care | Payer: Medicare Other | Source: Ambulatory Visit | Attending: Cardiology | Admitting: Cardiology

## 2016-07-24 VITALS — Ht 60.0 in | Wt 216.5 lb

## 2016-07-24 DIAGNOSIS — Z952 Presence of prosthetic heart valve: Secondary | ICD-10-CM | POA: Diagnosis not present

## 2016-07-24 DIAGNOSIS — R011 Cardiac murmur, unspecified: Secondary | ICD-10-CM | POA: Diagnosis not present

## 2016-07-24 DIAGNOSIS — Z87891 Personal history of nicotine dependence: Secondary | ICD-10-CM | POA: Diagnosis not present

## 2016-07-24 DIAGNOSIS — I10 Essential (primary) hypertension: Secondary | ICD-10-CM | POA: Diagnosis not present

## 2016-07-24 NOTE — Progress Notes (Signed)
Daily Session Note  Patient Details  Name: NAIAH DONAHOE MRN: [id number] Date of Birth: 1945-09-21 Referring Provider:     [facility] from 04/13/2016 in [place]  Referring Provider  Dr. Harl Bowie      Encounter Date: 07/24/2016  Check In:     Session Check In - 07/24/16 1132      Check-In   Location [facility]   Staff Present Russella Dar, MS, EP, [facility], Exercise Physiologist;Zandon Talton Luther Parody, BS, EP, Exercise Physiologist   Supervising physician immediately available to respond to emergencies See telemetry face sheet for immediately available MD   Medication changes reported     No   Fall or balance concerns reported    No   Warm-up and Cool-down Performed as group-led instruction   Resistance Training Performed Yes   VAD Patient? No     Pain Assessment   Currently in Pain? No/denies   Pain Score 0-No pain   Multiple Pain Sites No      Capillary Blood Glucose: No results found for this or any previous visit (from the past 24 hour(s)).    History  Smoking Status  . Former Smoker  . Packs/day: 0.50  . Years: 5.00  . Types: Cigarettes  . Start date: 08/04/1968  . Quit date: 10/10/1973  Smokeless Tobacco  . Never Used    Comment: smoked less than 1 pack every 2 weeks. Havent smoked in atleast 40 years    Goals Met:  Independence with exercise equipment Exercise tolerated well  Goals Unmet:  Not Applicable  Comments: Check out 1200   Dr. Kate Sable is Medical Director for [place] [facility].

## 2016-07-26 ENCOUNTER — Encounter (HOSPITAL_COMMUNITY): Payer: Medicare Other

## 2016-07-28 NOTE — Progress Notes (Signed)
Cardiac Individual Treatment Plan  Patient Details  Name: Angela Dawson MRN: [id number] Date of Birth: April 16, 1945 Referring Provider:     CARDIAC REHAB PHASE II ORIENTATION from 04/13/2016 in [place]  Referring Provider  Dr. Harl Bowie      Initial Encounter Date:    CARDIAC REHAB PHASE II ORIENTATION from 04/13/2016 in [place]  Date  04/13/16  Referring Provider  Dr. Harl Bowie      Visit Diagnosis: S/P AVR (aortic valve replacement)  Patient's Home Medications on Admission:  Current Outpatient Prescriptions:  .  acetaminophen (TYLENOL) 325 MG tablet, Take 2 tablets (650 mg total) by mouth every 6 (six) hours as needed for mild pain., Disp: , Rfl:  .  aspirin EC 325 MG EC tablet, Take 1 tablet (325 mg total) by mouth daily., Disp: 30 tablet, Rfl: 0 .  azithromycin (ZITHROMAX) 500 MG tablet, TAKE 1 TABLET 1 HOUR PRIOR TO DENTAL PROCEDURE, Disp: 1 tablet, Rfl: 0 .  cetirizine (ZYRTEC) 10 MG tablet, TAKE 1 TABLET EVERY DAY, Disp: 90 tablet, Rfl: 1 .  lisinopril (PRINIVIL,ZESTRIL) 10 MG tablet, Take 1 tablet (10 mg total) by mouth daily., Disp: 90 tablet, Rfl: 3 .  metoprolol tartrate (LOPRESSOR) 25 MG tablet, Take 0.5 tablets (12.5 mg total) by mouth 2 (two) times daily., Disp: 90 tablet, Rfl: 3  Past Medical History: Past Medical History:  Diagnosis Date  . Allergy    seasonal  . Chicken pox   . Complication of anesthesia    difficult to wake up after tubal ligations  . Dyspnea    with exertion  . Heart murmur   . History of bronchitis   . History of kidney stones    "only one"  . Hypertension   . Measles     Tobacco Use: History  Smoking Status  . Former Smoker  . Packs/day: 0.50  . Years: 5.00  . Types: Cigarettes  . Start date: 08/04/1968  . Quit date: 10/10/1973  Smokeless Tobacco  . Never Used    Comment: smoked less than 1 pack every 2 weeks. Havent smoked in atleast 40 years    Labs: Recent Review Flowsheet Data     Labs for ITP Cardiac and Pulmonary Rehab Latest Ref Rng & Units 01/18/2016 01/18/2016 01/18/2016 01/18/2016 02/29/2016   Cholestrol 100 - 199 mg/dL - - - - 188   LDLCALC 0 - 99 mg/dL - - - - 120(H)   HDL >39 mg/dL - - - - 43   Trlycerides 0 - 149 mg/dL - - - - 127   Hemoglobin A1c 4.8 - 5.6 % - - - - -   PHART 7.350 - 7.450 7.495(H) 7.375 7.394 - -   PCO2ART 32.0 - 48.0 mmHg 33.2 40.7 38.6 - -   HCO3 20.0 - 28.0 mmol/L 25.4 23.7 23.5 - -   TCO2 0 - 100 mmol/L _0 -   ACIDBASEDEF 0.0 - 2.0 mmol/L - 1.0 1.0 - -   O2SAT % 99.0 97.0 96.0 - -      Capillary Blood Glucose: Lab Results  Component Value Date   GLUCAP 100 (H) 01/22/2016   GLUCAP 109 (H) 01/22/2016   GLUCAP 103 (H) 01/22/2016   GLUCAP 101 (H) 01/21/2016   GLUCAP 115 (H) 01/21/2016     Exercise Target Goals:    Exercise Program Goal: Individual exercise prescription set with THRR, safety & activity barriers. Participant demonstrates ability to understand and report RPE using BORG  scale, to self-measure pulse accurately, and to acknowledge the importance of the exercise prescription.  Exercise Prescription Goal: Starting with aerobic activity 30 plus minutes a day, 3 days per week for initial exercise prescription. Provide home exercise prescription and guidelines that participant acknowledges understanding prior to discharge.  Activity Barriers & Risk Stratification:     Activity Barriers & Cardiac Risk Stratification - 04/13/16 1448      Activity Barriers & Cardiac Risk Stratification   Activity Barriers None   Cardiac Risk Stratification High      6 Minute Walk:     6 Minute Walk    Row Name 04/13/16 1443 07/25/16 0757       6 Minute Walk   Phase Initial Discharge    Distance 1500 feet 1600 feet    Distance % Change 0 % 6.67 %    Walk Time 6 minutes 6 minutes    # of Rest Breaks 0 0    MPH 2.84 3.03    METS 3.17 3.32    RPE 13 12    Perceived Dyspnea  14 12    VO2 Peak 11.78 13.4     Symptoms No No    Resting HR 74 bpm 84 bpm    Resting BP 158/80 132/78    Max Ex. HR 124 bpm 138 bpm    Max Ex. BP 198/100 210/94    2 Minute Post BP 162/92 176/88       Oxygen Initial Assessment:   Oxygen Re-Evaluation:   Oxygen Discharge (Final Oxygen Re-Evaluation):   Initial Exercise Prescription:     Initial Exercise Prescription - 04/13/16 1200      Date of Initial Exercise RX and Referring Provider   Date 04/13/16   Referring Provider Dr. Harl Bowie     NuStep   Level 2   SPM 15   Minutes 15   METs 1.9     Arm Ergometer   Level 2   Watts 20   Minutes 20   METs 2.8     Prescription Details   Frequency (times per week) 3   Duration Progress to 30 minutes of continuous aerobic without signs/symptoms of physical distress     Intensity   THRR REST +  30   THRR 40-80% of Max Heartrate 105-120-136   Ratings of Perceived Exertion 11-13   Perceived Dyspnea 0-4     Progression   Progression Continue progressive overload as per policy without signs/symptoms or physical distress.     Resistance Training   Training Prescription Yes   Weight 1   Reps 10-12      Perform Capillary Blood Glucose checks as needed.  Exercise Prescription Changes:      Exercise Prescription Changes    Row Name 05/08/16 0800 05/24/16 1000 06/05/16 1300 06/27/16 0800 07/19/16 1400     Response to Exercise   Blood Pressure (Admit) 168/78 138/76 150/82 144/76 120/70   Blood Pressure (Exercise) 184/80 150/84 170/70 283/82 166/78   Blood Pressure (Exit) 138/78 130/72 140/66 138/72 130/70   Heart Rate (Admit) 62 bpm 63 bpm 61 bpm 69 bpm 62 bpm   Heart Rate (Exercise) 99 bpm 94 bpm 104 bpm 111 bpm 93 bpm   Heart Rate (Exit) 71 bpm 70 bpm 72 bpm 75 bpm 71 bpm   Rating of Perceived Exertion (Exercise) '11 11 11 12 11   '$ Duration Progress to 30 minutes of  aerobic without signs/symptoms of physical distress Progress to 30 minutes of  aerobic  without signs/symptoms of physical distress  Progress to 30 minutes of  aerobic without signs/symptoms of physical distress Progress to 30 minutes of  aerobic without signs/symptoms of physical distress Progress to 30 minutes of  aerobic without signs/symptoms of physical distress   Intensity THRR unchanged THRR unchanged THRR unchanged THRR unchanged THRR unchanged     Resistance Training   Training Prescription Yes Yes Yes Yes Yes   Weight '2 2 2 2 3   '$ Reps 10-15 10-15 10-15 10-15 10-15     NuStep   Level '2 3 3 3 4   '$ SPM 21 33 28 32 42   Minutes '20 20 20 20 20   '$ METs 3.65 3.65 3.66 3.66 3.66     Arm Ergometer   Level 2.5 2.5 2.'6 3 3   '$ Watts 31 37 40 40 39   Minutes '15 15 15 15 15   '$ METs 3.4 3.9 4.1 4.1 3.96     Home Exercise Plan   Plans to continue exercise at Home (comment) Home (comment) Home (comment) Home (comment) Home (comment)   Frequency Add 2 additional days to program exercise sessions. Add 2 additional days to program exercise sessions. Add 2 additional days to program exercise sessions. Add 2 additional days to program exercise sessions. Add 2 additional days to program exercise sessions.      Exercise Comments:      Exercise Comments    Row Name 05/08/16 0840 05/24/16 1050 06/05/16 1333 06/27/16 0822 07/19/16 1439   Exercise Comments Patient is progresssing well and it doing exceptional on the Arm ergometer Patient is progressing well in both the arm crank and nustep.  Patient is progressing very well especially on the arm crank and she is maintaining her level and watts on the nustep Patient is doing very well in Cr especially on the arm ergometer Patient is progressing very well in CR.       Exercise Goals and Review:   Exercise Goals Re-Evaluation :     Exercise Goals Re-Evaluation    Row Name 05/09/16 0740 06/07/16 1432 06/28/16 1413 07/19/16 1639 07/28/16 1419     Exercise Goal Re-Evaluation   Exercise Goals Review Increase Strenth and Stamina;Increase Physical Activity Increase Physical  Activity;Increase Strenth and Stamina Increase Physical Activity;Increase Strenth and Stamina  Get back to walking again. Increase Physical Activity;Increase Strenth and Stamina  Get stronger; get back to walking again. Increase Physical Activity;Increase Strenth and Stamina  Get back to walking more.   Comments Patient says her strength and stamina have increased. She is able to do more around the house with less fatigue.  After completing 21 sessions, patient's strength, stamina and activity have increased. She says she feels much better and is able to do things around the house she has not done in a long time.  After completing 26 sessions, patient says she feels stronger and much better overall. She says she is able to do things she has not felt like doing in a long time. She says the program is really benefiting her.  Patient has completed 34 sessions. She has progressed well in the program with increased strength, stamina and activity. She says she is doing more around the house than she ever has and feels amazing.  Patient's strength and stamina did increase at discharge. She reports feeling a lot stronger. 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    Expected Outcomes Patient will continue to progress in the program.  Patient will complete the program with continued strength, stamina and activity. Patient will complete the program with continued increased strength, staimna, and activity.  Patient will graduate with continued increased strength, stamina, and activity. Patient will continue to exercise with continued increased strength, stamina, and activity.       Discharge Exercise Prescription (Final Exercise Prescription Changes):     Exercise Prescription Changes - 07/19/16 1400      Response to Exercise   Blood Pressure (Admit) 120/70   Blood Pressure (Exercise) 166/78   Blood  Pressure (Exit) 130/70   Heart Rate (Admit) 62 bpm   Heart Rate (Exercise) 93 bpm   Heart Rate (Exit) 71 bpm   Rating of Perceived Exertion (Exercise) 11   Duration Progress to 30 minutes of  aerobic without signs/symptoms of physical distress   Intensity THRR unchanged     Resistance Training   Training Prescription Yes   Weight 3   Reps 10-15     NuStep   Level 4   SPM 42   Minutes 20   METs 3.66     Arm Ergometer   Level 3   Watts 39   Minutes 15   METs 3.96     Home Exercise Plan   Plans to continue exercise at Home (comment)   Frequency Add 2 additional days to program exercise sessions.      Nutrition:  Target Goals: Understanding of nutrition guidelines, daily intake of sodium '1500mg'$ , cholesterol '200mg'$ , calories 30% from fat and 7% or less from saturated fats, daily to have 5 or more servings of fruits and vegetables.  Biometrics:     Pre Biometrics - 04/13/16 1253      Pre Biometrics   Height 5' (1.524 m)   Weight 212 lb 1.3 oz (96.2 kg)   Waist Circumference 41 inches   Hip Circumference 47 inches   Waist to Hip Ratio 0.87 %   BMI (Calculated) 41.5   Triceps Skinfold 30 mm   % Body Fat 50 %   Grip Strength 50 kg   Flexibility 0 in   Single Leg Stand 5 seconds         Post Biometrics - 07/25/16 0759       Post  Biometrics   Height 5' (1.524 m)   Weight 216 lb 7.9 oz (98.2 kg)   Waist Circumference 42 inches   Hip Circumference 46 inches   Waist to Hip Ratio 0.91 %   BMI (Calculated) 42.4   Triceps Skinfold 31 mm   % Body Fat 51.1 %   Grip Strength 45 kg   Flexibility 0 in   Single Leg Stand 6 seconds      Nutrition Therapy Plan and Nutrition Goals:   Nutrition Discharge: Rate Your Plate Scores:     Nutrition Assessments - 07/28/16 1415      MEDFICTS Scores   Pre Score 30   Post Score 18   Score Difference -12      Nutrition Goals Re-Evaluation:   Nutrition Goals Discharge (Final Nutrition Goals  Re-Evaluation):   Psychosocial: Target Goals: Acknowledge presence or absence of significant depression and/or stress, maximize coping skills, provide positive support system. Participant is able to verbalize types and ability to use techniques and skills needed for reducing stress and depression.  Initial Review & Psychosocial Screening:     Initial Psych Review & Screening -  04/13/16 [place]? Yes     Barriers   Psychosocial barriers to participate in program There are no identifiable barriers or psychosocial needs.     Screening Interventions   Interventions Encouraged to exercise      Quality of Life Scores:     Quality of Life - 07/25/16 0800      Quality of Life Scores   Health/Function Pre 29.54 %   Health/Function Post 28.77 %   Health/Function % Change -2.61 %   Socioeconomic Pre 29.14 %   Socioeconomic Post 27.14 %   Socioeconomic % Change  -6.86 %   Psych/Spiritual Pre 29.14 %   Psych/Spiritual Post 30 %   Psych/Spiritual % Change 2.95 %   Family Pre 30 %   Family Post 27.5 %   Family % Change -8.33 %   GLOBAL Pre 29.42 %   GLOBAL Post 28.5 %   GLOBAL % Change -3.13 %      PHQ-9: Recent Review Flowsheet Data    Depression screen [facility] 2/9 07/28/2016 04/13/2016 02/29/2016 11/30/2015 10/11/2015   Decreased Interest 0 0 0 0 0   Down, Depressed, Hopeless 0 0 0 0 0   PHQ - 2 Score 0 0 0 0 0   Altered sleeping 0 0 - - -   Tired, decreased energy 0 0 - - -   Change in appetite 0 0 - - -   Feeling bad or failure about yourself  0 0 - - -   Trouble concentrating 0 0 - - -   Moving slowly or fidgety/restless 0 0 - - -   Suicidal thoughts 0 0 - - -   PHQ-9 Score 0 0 - - -     Interpretation of Total Score  Total Score Depression Severity:  1-4 = Minimal depression, 5-9 = Mild depression, 10-14 = Moderate depression, 15-19 = Moderately severe depression, 20-27 = Severe depression   Psychosocial Evaluation and  Intervention:     Psychosocial Evaluation - 07/28/16 1420      Discharge Psychosocial Assessment & Intervention   Comments Patient has no psychosocial issues identified at discharge.       Psychosocial Re-Evaluation:     Psychosocial Re-Evaluation    Row Name 05/09/16 [phone] 06/07/16 1435 06/28/16 1414 07/19/16 1642       Psychosocial Re-Evaluation   Current issues with None Identified None Identified None Identified None Identified    Comments Patient's QOL score was 29.42 and her PHQ-9 score was 0.  -  -  -    Expected Outcomes  - Patient will have no psychosocial barriers identified at discharge.  Patient will have no psychosocial issues identified at [place].  Patient will have no psychosocial barriers identified at discharge.     Interventions  - Encouraged to attend Cardiac Rehabilitation for the exercise Encouraged to attend Cardiac Rehabilitation for the exercise Encouraged to attend Cardiac Rehabilitation for the exercise    Continue Psychosocial Services  No Follow up required No Follow up required No Follow up required No Follow up required       Psychosocial Discharge (Final Psychosocial Re-Evaluation):     Psychosocial Re-Evaluation - 07/19/16 1642      Psychosocial Re-Evaluation   Current issues with None Identified   Expected Outcomes Patient will have no psychosocial barriers identified at discharge.    Interventions Encouraged to attend Cardiac Rehabilitation for the exercise   Continue Psychosocial Services  No Follow up required      Vocational Rehabilitation: Provide vocational rehab assistance to qualifying candidates.   Vocational Rehab Evaluation & Intervention:     Vocational Rehab - 04/13/16 1450      Initial Vocational Rehab Evaluation & Intervention   Assessment shows need for Vocational Rehabilitation No      Education: Education Goals: Education classes will be provided on a weekly basis, covering required topics. Participant will  state understanding/return demonstration of topics presented.  Learning Barriers/Preferences:     Learning Barriers/Preferences - 04/13/16 1449      Learning Barriers/Preferences   Learning Barriers None   Learning Preferences Skilled Demonstration      Education Topics: Hypertension, Hypertension Reduction -Define heart disease and high blood pressure. Discus how high blood pressure affects the body and ways to reduce high blood pressure.   CARDIAC REHAB PHASE II EXERCISE from 07/19/2016 in [place]  Date  (P) 07/05/16  Educator  (P) Russella Dar  Instruction Review Code  (P) 2- meets goals/outcomes      Exercise and Your Heart -Discuss why it is important to exercise, the FITT principles of exercise, normal and abnormal responses to exercise, and how to exercise safely.   CARDIAC REHAB PHASE II EXERCISE from 07/19/2016 in [place]  Date  (P) 07/12/16  Educator  (P) DC  Instruction Review Code  (P) 2- meets goals/outcomes      Angina -Discuss definition of angina, causes of angina, treatment of angina, and how to decrease risk of having angina.   CARDIAC REHAB PHASE II EXERCISE from 07/19/2016 in [place]  Date  (P) 04/19/16  Educator  (P) DC  Instruction Review Code  (P) 2- meets goals/outcomes      Cardiac Medications -Review what the following cardiac medications are used for, how they affect the body, and side effects that may occur when taking the medications.  Medications include Aspirin, Beta blockers, calcium channel blockers, ACE Inhibitors, angiotensin receptor blockers, diuretics, digoxin, and antihyperlipidemics.   CARDIAC REHAB PHASE II EXERCISE from 07/19/2016 in [place]  Date  (P) 04/26/16  Educator  (P) DC  Instruction Review Code  (P) 2- meets goals/outcomes      Congestive Heart Failure -Discuss the definition of CHF, how to live with CHF, the signs and  symptoms of CHF, and how keep track of weight and sodium intake.   CARDIAC REHAB PHASE II EXERCISE from 07/19/2016 in [place]  Date  (P) 05/03/16  Educator  (P) DC  Instruction Review Code  (P) 2- meets goals/outcomes      Heart Disease and Intimacy -Discus the effect sexual activity has on the heart, how changes occur during intimacy as we age, and safety during sexual activity.   CARDIAC REHAB PHASE II EXERCISE from 07/19/2016 in [place]  Date  (P) 05/10/16  Educator  (P) DC  Instruction Review Code  (P) 2- meets goals/outcomes      Smoking Cessation / COPD -Discuss different methods to quit smoking, the health benefits of quitting smoking, and the definition of COPD.   CARDIAC REHAB PHASE II EXERCISE from 07/19/2016 in [place]  Date  (P) 05/17/16  Educator  (P) DC  Instruction Review Code  (P) 2- meets goals/outcomes      Nutrition I: Fats -Discuss the types of cholesterol, what cholesterol does to the heart, and how cholesterol levels can be controlled.  Nutrition II: Labels -Discuss the different components of food labels and how to read food label   CARDIAC REHAB PHASE II EXERCISE from 07/19/2016 in [facility] CARDIAC REHABILITATION  Date  (P) 05/31/16  Educator  (P) DC  Instruction Review Code  (P) 2- meets goals/outcomes      Heart Parts and Heart Disease -Discuss the anatomy of the heart, the pathway of blood circulation through the heart, and these are affected by heart disease.   CARDIAC REHAB PHASE II EXERCISE from 07/19/2016 in [facility] CARDIAC REHABILITATION  Date  (P) 06/07/16  Educator  (P) DJ  Instruction Review Code  (P) 2- meets goals/outcomes      Stress I: Signs and Symptoms -Discuss the causes of stress, how stress may lead to anxiety and depression, and ways to limit stress.   CARDIAC REHAB PHASE II EXERCISE from 07/19/2016 in [facility] CARDIAC REHABILITATION  Date   (P) 06/14/16  Educator  (P) D. Coad  Instruction Review Code  (P) 2- meets goals/outcomes      Stress II: Relaxation -Discuss different types of relaxation techniques to limit stress.   Warning Signs of Stroke / TIA -Discuss definition of a stroke, what the signs and symptoms are of a stroke, and how to identify when someone is having stroke.   Knowledge Questionnaire Score:     Knowledge Questionnaire Score - 07/28/16 1415      Knowledge Questionnaire Score   Pre Score 21/24   Post Score 23/24      Core Components/Risk Factors/Patient Goals at Admission:     Personal Goals and Risk Factors at Admission - 04/13/16 1451      Core Components/Risk Factors/Patient Goals on Admission    Weight Management Obesity;Yes   Admit Weight 212 lb (96.2 kg)   Goal Weight: Short Term 202 lb (91.6 kg)   Goal Weight: Long Term 187 lb (84.8 kg)   Expected Outcomes Short Term: Continue to assess and modify interventions until short term weight is achieved   Increase Strength and Stamina Yes   Intervention Provide advice, education, support and counseling about physical activity/exercise needs.;Develop an individualized exercise prescription for aerobic and resistive training based on initial evaluation findings, risk stratification, comorbidities and participant's personal goals.   Expected Outcomes Achievement of increased cardiorespiratory fitness and enhanced flexibility, muscular endurance and strength shown through measurements of functional capacity and personal statement of participant.   Personal Goal Other Yes   Personal Goal Lose 25 lbs at graduation. Lose 2 lbs/week. Get stronger; be able to walk more.   Intervention Patient will attend cardiac rehab 3 days/week and supplement with exercise at home 2 days/week.    Expected Outcomes Patient will meet his personal goal.       Core Components/Risk Factors/Patient Goals Review:      Goals and Risk Factor Review    Row Name  04/13/16 1455 05/09/16 0737 06/07/16 1430 06/28/16 1411 07/19/16 1637     Core Components/Risk Factors/Patient Goals Review   Personal Goals Review Weight Management/Obesity;Increase Strength and Stamina Weight Management/Obesity;Hypertension  Get stronger; lose 25 lbs; get back to walking again.  Weight Management/Obesity  Get stronger; lose 25 lbs; get back to walking again.  Weight Management/Obesity;Hypertension Weight Management/Obesity;Hypertension   Review  - Patient has completed 9 sessions. She has lost .2 lbs since starting the program. Her b/p continues to be hypertensive. She says she feels better since starting the program. Will continue to monitor for progress.  Patient has completed 64  sessions gaining 1 lb. Her b/p readings have improved. She is progressing well in the program.  Patient has completed 26 sessions losing 1 lb. Her b/p readings are more WNL but continue to be slightly elevated.  Patient has completed 34 sessions maintaining her weight. Her b/p readings are lower than when she started. She says she feels much better and wants to join our [employer].    Expected Outcomes  - Patient will complete the program meeting her personal goals.  Patient will complete the program meeting her personal goals.  Patient will complete the program with improved b/p readings and she will begin to lose weight.  Patient will graduate with continued b/p control maintaining her weight.    [place] Name 07/28/16 1416             Core Components/Risk Factors/Patient Goals Review   Personal Goals Review Weight Management/Obesity;Hypertension       Review Patient graduated at 36 sessions maintaining her weight. She lost one inch in her hip measurements. Patient b/p readings were improved at discharge. She says she has a lot more energy and is eating healthier. Her medficts scores improved and she says she learned how to read food labels. She says the program really helped her overall. Her  balance score improved and her exit walk test also improved.        Expected Outcomes Patient has joined our [employer]. She will continue exercising maintaining her weight and b/p.          Core Components/Risk Factors/Patient Goals at Discharge (Final Review):      Goals and Risk Factor Review - 07/28/16 1416      Core Components/Risk Factors/Patient Goals Review   Personal Goals Review Weight Management/Obesity;Hypertension   Review Patient graduated at 36 sessions maintaining her weight. She lost one inch in her hip measurements. Patient b/p readings were improved at discharge. She says she has a lot more energy and is eating healthier. Her medficts scores improved and she says she learned how to read food labels. She says the program really helped her overall. Her balance score improved and her exit walk test also improved.    Expected Outcomes Patient has joined our [employer]. She will continue exercising maintaining her weight and b/p.      ITP Comments:     ITP Comments    Row Name 04/13/16 1534 05/08/16 1504         ITP Comments Patient new to program. Plans to start 04/17/16. Patient met with Registered Dietitian to discuss nutrition topics including: Heart healthty eating, heart health cooking and make smart choices when shopping; Portion control; weight management; and hydration. Patient attended a group session with the hospital chaplian called [facility] to discuss and share how her recent cardiac diagnosis has effected her life.         Comments: Patient graduated from [place] today on 07/24/16 after completing 36 sessions. She achieved LTG of 30 minutes of aerobic exercise at Max Met level of 3.6. All patients vitals are WNL. Patient has met with dietician. Discharge instruction has been reviewed in detail and patient stated an understanding of material given. Patient has joined our maintenance program. Cardiac Rehab staff will make f/u  calls at 1 month, 6 months, and 1 year. Patient had no complaints of any abnormal S/S or pain on their exit visit.

## 2016-07-28 NOTE — Progress Notes (Signed)
Discharge Summary  Patient Details  Name: Angela Dawson MRN: [id number] Date of Birth: 22-Mar-1945 Referring Provider:     CARDIAC REHAB PHASE II ORIENTATION from 04/13/2016 in [facility] CARDIAC REHABILITATION  Referring Provider  Dr. Wyline Mood       Number of Visits: 36  Reason for Discharge:  Patient reached a stable level of exercise. Patient independent in their exercise.  Smoking History:  History  Smoking Status  . Former Smoker  . Packs/day: 0.50  . Years: 5.00  . Types: Cigarettes  . Start date: 08/04/1968  . Quit date: 10/10/1973  Smokeless Tobacco  . Never Used    Comment: smoked less than 1 pack every 2 weeks. Havent smoked in atleast 40 years    Diagnosis:  S/P AVR (aortic valve replacement)  ADL UCSD:   Initial Exercise Prescription:     Initial Exercise Prescription - 04/13/16 1200      Date of Initial Exercise RX and Referring Provider   Date 04/13/16   Referring Provider Dr. Wyline Mood     NuStep   Level 2   SPM 15   Minutes 15   METs 1.9     Arm Ergometer   Level 2   Watts 20   Minutes 20   METs 2.8     Prescription Details   Frequency (times per week) 3   Duration Progress to 30 minutes of continuous aerobic without signs/symptoms of physical distress     Intensity   THRR REST +  30   THRR 40-80% of Max Heartrate 105-120-136   Ratings of Perceived Exertion 11-13   Perceived Dyspnea 0-4     Progression   Progression Continue progressive overload as per policy without signs/symptoms or physical distress.     Resistance Training   Training Prescription Yes   Weight 1   Reps 10-12      Discharge Exercise Prescription (Final Exercise Prescription Changes):     Exercise Prescription Changes - 07/19/16 1400      Response to Exercise   Blood Pressure (Admit) 120/70   Blood Pressure (Exercise) 166/78   Blood Pressure (Exit) 130/70   Heart Rate (Admit) 62 bpm   Heart Rate (Exercise) 93 bpm   Heart Rate (Exit) 71 bpm   Rating of  Perceived Exertion (Exercise) 11   Duration Progress to 30 minutes of  aerobic without signs/symptoms of physical distress   Intensity THRR unchanged     Resistance Training   Training Prescription Yes   Weight 3   Reps 10-15     NuStep   Level 4   SPM 42   Minutes 20   METs 3.66     Arm Ergometer   Level 3   Watts 39   Minutes 15   METs 3.96     Home Exercise Plan   Plans to continue exercise at Home (comment)   Frequency Add 2 additional days to program exercise sessions.      Functional Capacity:     6 Minute Walk    Row Name 04/13/16 1443 07/25/16 0757       6 Minute Walk   Phase Initial Discharge    Distance 1500 feet 1600 feet    Distance % Change 0 % 6.67 %    Walk Time 6 minutes 6 minutes    # of Rest Breaks 0 0    MPH 2.84 3.03    METS 3.17 3.32    RPE 13 12    Perceived  Dyspnea  14 12    VO2 Peak 11.78 13.4    Symptoms No No    Resting HR 74 bpm 84 bpm    Resting BP 158/80 132/78    Max Ex. HR 124 bpm 138 bpm    Max Ex. BP 198/100 210/94    2 Minute Post BP 162/92 176/88       Psychological, QOL, Others - Outcomes: PHQ 2/9: Depression screen [facility] 2/9 07/28/2016 04/13/2016 02/29/2016 11/30/2015 10/11/2015  Decreased Interest 0 0 0 0 0  Down, Depressed, Hopeless 0 0 0 0 0  PHQ - 2 Score 0 0 0 0 0  Altered sleeping 0 0 - - -  Tired, decreased energy 0 0 - - -  Change in appetite 0 0 - - -  Feeling bad or failure about yourself  0 0 - - -  Trouble concentrating 0 0 - - -  Moving slowly or fidgety/restless 0 0 - - -  Suicidal thoughts 0 0 - - -  PHQ-9 Score 0 0 - - -    Quality of Life:     Quality of Life - 07/25/16 0800      Quality of Life Scores   Health/Function Pre 29.54 %   Health/Function Post 28.77 %   Health/Function % Change -2.61 %   Socioeconomic Pre 29.14 %   Socioeconomic Post 27.14 %   Socioeconomic % Change  -6.86 %   Psych/Spiritual Pre 29.14 %   Psych/Spiritual Post 30 %   Psych/Spiritual % Change 2.95 %   Family  Pre 30 %   Family Post 27.5 %   Family % Change -8.33 %   GLOBAL Pre 29.42 %   GLOBAL Post 28.5 %   GLOBAL % Change -3.13 %      Personal Goals: Goals established at orientation with interventions provided to work toward goal.     Personal Goals and Risk Factors at Admission - 04/13/16 1451      Core Components/Risk Factors/Patient Goals on Admission    Weight Management Obesity;Yes   Admit Weight 212 lb (96.2 kg)   Goal Weight: Short Term 202 lb (91.6 kg)   Goal Weight: Long Term 187 lb (84.8 kg)   Expected Outcomes Short Term: Continue to assess and modify interventions until short term weight is achieved   Increase Strength and Stamina Yes   Intervention Provide advice, education, support and counseling about physical activity/exercise needs.;Develop an individualized exercise prescription for aerobic and resistive training based on initial evaluation findings, risk stratification, comorbidities and participant's personal goals.   Expected Outcomes Achievement of increased cardiorespiratory fitness and enhanced flexibility, muscular endurance and strength shown through measurements of functional capacity and personal statement of participant.   Personal Goal Other Yes   Personal Goal Lose 25 lbs at graduation. Lose 2 lbs/week. Get stronger; be able to walk more.   Intervention Patient will attend cardiac rehab 3 days/week and supplement with exercise at home 2 days/week.    Expected Outcomes Patient will meet his personal goal.        Personal Goals Discharge:     Goals and Risk Factor Review    Row Name 04/13/16 1455 05/09/16 0737 06/07/16 1430 06/28/16 1411 07/19/16 1637     Core Components/Risk Factors/Patient Goals Review   Personal Goals Review Weight Management/Obesity;Increase Strength and Stamina Weight Management/Obesity;Hypertension  Get stronger; lose 25 lbs; get back to walking again.  Weight Management/Obesity  Get stronger; lose 25 lbs; get back to walking  again.  Weight Management/Obesity;Hypertension Weight Management/Obesity;Hypertension   Review  - Patient has completed 9 sessions. She has lost .2 lbs since starting the program. Her b/p continues to be hypertensive. She says she feels better since starting the program. Will continue to monitor for progress.  Patient has completed 21 sessions gaining 1 lb. Her b/p readings have improved. She is progressing well in the program.  Patient has completed 26 sessions losing 1 lb. Her b/p readings are more WNL but continue to be slightly elevated.  Patient has completed 34 sessions maintaining her weight. Her b/p readings are lower than when she started. She says she feels much better and wants to join our [facility] program.    Expected Outcomes  - Patient will complete the program meeting her personal goals.  Patient will complete the program meeting her personal goals.  Patient will complete the program with improved b/p readings and she will begin to lose weight.  Patient will graduate with continued b/p control maintaining her weight.    Row Name 07/28/16 1416             Core Components/Risk Factors/Patient Goals Review   Personal Goals Review Weight Management/Obesity;Hypertension       Review Patient graduated at 36 sessions maintaining her weight. She lost one inch in her hip measurements. Patient b/p readings were improved at discharge. She says she has a lot more energy and is eating healthier. Her medficts scores improved and she says she learned how to read food labels. She says the program really helped her overall. Her balance score improved and her exit walk test also improved.        Expected Outcomes Patient has joined our [facility] program. She will continue exercising maintaining her weight and b/p.          Nutrition & Weight - Outcomes:     Pre Biometrics - 04/13/16 1253      Pre Biometrics   Height 5' (1.524 m)   Weight 212 lb 1.3 oz (96.2 kg)   Waist Circumference 41  inches   Hip Circumference 47 inches   Waist to Hip Ratio 0.87 %   BMI (Calculated) 41.5   Triceps Skinfold 30 mm   % Body Fat 50 %   Grip Strength 50 kg   Flexibility 0 in   Single Leg Stand 5 seconds         Post Biometrics - 07/25/16 0759       Post  Biometrics   Height 5' (1.524 m)   Weight 216 lb 7.9 oz (98.2 kg)   Waist Circumference 42 inches   Hip Circumference 46 inches   Waist to Hip Ratio 0.91 %   BMI (Calculated) 42.4   Triceps Skinfold 31 mm   % Body Fat 51.1 %   Grip Strength 45 kg   Flexibility 0 in   Single Leg Stand 6 seconds      Nutrition:   Nutrition Discharge:     Nutrition Assessments - 07/28/16 1415      MEDFICTS Scores   Pre Score 30   Post Score 18   Score Difference -12      Education Questionnaire Score:     Knowledge Questionnaire Score - 07/28/16 1415      Knowledge Questionnaire Score   Pre Score 21/24   Post Score 23/24      Goals reviewed with patient; copy given to patient.

## 2016-08-04 ENCOUNTER — Telehealth: Payer: Self-pay | Admitting: Nurse Practitioner

## 2016-08-04 NOTE — Telephone Encounter (Signed)
Scheduled

## 2016-08-15 ENCOUNTER — Other Ambulatory Visit: Payer: Self-pay | Admitting: *Deleted

## 2016-08-15 DIAGNOSIS — Z8679 Personal history of other diseases of the circulatory system: Secondary | ICD-10-CM

## 2016-08-15 DIAGNOSIS — Z9889 Other specified postprocedural states: Secondary | ICD-10-CM

## 2016-08-15 DIAGNOSIS — Z952 Presence of prosthetic heart valve: Secondary | ICD-10-CM

## 2016-08-24 ENCOUNTER — Other Ambulatory Visit: Payer: Self-pay | Admitting: Physician Assistant

## 2016-09-21 ENCOUNTER — Ambulatory Visit
Admission: RE | Admit: 2016-09-21 | Discharge: 2016-09-21 | Disposition: A | Discharge status: Home / Self Care | Payer: Medicare Other | Source: Ambulatory Visit | Attending: Cardiothoracic Surgery | Admitting: Cardiothoracic Surgery

## 2016-09-21 ENCOUNTER — Ambulatory Visit (INDEPENDENT_AMBULATORY_CARE_PROVIDER_SITE_OTHER): Payer: Medicare Other | Admitting: Cardiothoracic Surgery

## 2016-09-21 ENCOUNTER — Encounter: Payer: Self-pay | Admitting: Cardiothoracic Surgery

## 2016-09-21 VITALS — BP 146/68 | HR 60 | Resp 16 | Ht 60.0 in | Wt 213.0 lb

## 2016-09-21 DIAGNOSIS — Z952 Presence of prosthetic heart valve: Secondary | ICD-10-CM | POA: Diagnosis not present

## 2016-09-21 DIAGNOSIS — I712 Thoracic aortic aneurysm, without rupture: Secondary | ICD-10-CM

## 2016-09-21 DIAGNOSIS — I7121 Aneurysm of the ascending aorta, without rupture: Secondary | ICD-10-CM

## 2016-09-21 DIAGNOSIS — Z8679 Personal history of other diseases of the circulatory system: Secondary | ICD-10-CM

## 2016-09-21 DIAGNOSIS — Z9889 Other specified postprocedural states: Secondary | ICD-10-CM

## 2016-09-21 MED ORDER — IOPAMIDOL (ISOVUE-370) INJECTION 76%
75.0000 mL | Freq: Once | INTRAVENOUS | Status: AC | PRN
  Administered 2016-09-21: 75 mL via INTRAVENOUS

## 2016-09-21 NOTE — Patient Instructions (Signed)

## 2016-09-21 NOTE — Progress Notes (Signed)
[address]       [place] [address]             [phone]      DAVID TOWSON [facility] Medical Record #[id number] Date of Birth: 1945/11/28  Referring: Antoine Poche, MD Primary Care: Bennie Pierini, FNP  Chief Complaint:   POST OP FOLLOW UP 01/17/2016 SURGICAL PROCEDURE:  Aortic valve replacement with a pericardial tissue valve, [employer], model 3300TFX 21 mm, serial #[id number], and supracoronary replacement of ascending aorta.   History of Present Illness:     Patient returns to the office now 8  months after replacement of her aortic valve and ascending aorta. She's made good progress postoperatively, she denies shortness of breath. She has now returned to working 4 days a week and the [employer].    Past Medical History:  Diagnosis Date  . Allergy    seasonal  . Chicken pox   . Complication of anesthesia    difficult to wake up after tubal ligations  . Dyspnea    with exertion  . Heart murmur   . History of bronchitis   . History of kidney stones    "only one"  . Hypertension   . Measles      History  Smoking Status  . Former Smoker  . Packs/day: 0.50  . Years: 5.00  . Types: Cigarettes  . Start date: 08/04/1968  . Quit date: 10/10/1973  Smokeless Tobacco  . Never Used    Comment: smoked less than 1 pack every 2 weeks. Havent smoked in atleast 40 years    History  Alcohol Use No     Allergies  Allergen Reactions  . Crestor [Rosuvastatin]     Dizziness  . Lipitor [Atorvastatin]     Unable to stand or move  . Statins     Leg cramps   . Penicillins Rash    Has patient had a PCN reaction causing immediate rash, facial/tongue/throat swelling, SOB or lightheadedness with hypotension: Yes Has patient had a PCN reaction causing severe rash involving mucus membranes or skin necrosis: No Has patient had a PCN reaction that required hospitalization No Has patient had a PCN reaction occurring within the  last 10 years: No If all of the above answers are "NO", then may proceed with Cephalosporin use.     Current Outpatient Prescriptions  Medication Sig Dispense Refill  . acetaminophen (TYLENOL) 325 MG tablet Take 2 tablets (650 mg total) by mouth every 6 (six) hours as needed for mild pain.    Marland Kitchen aspirin EC 325 MG EC tablet Take 1 tablet (325 mg total) by mouth daily. 30 tablet 0  . azithromycin (ZITHROMAX) 500 MG tablet TAKE 1 TABLET 1 HOUR PRIOR TO DENTAL PROCEDURE 1 tablet 0  . cetirizine (ZYRTEC) 10 MG tablet TAKE 1 TABLET EVERY DAY 90 tablet 1  . lisinopril (PRINIVIL,ZESTRIL) 10 MG tablet Take 1 tablet (10 mg total) by mouth daily. 90 tablet 3  . metoprolol tartrate (LOPRESSOR) 25 MG tablet Take 0.5 tablets (12.5 mg total) by mouth 2 (two) times daily. 90 tablet 3   No current facility-administered medications for this visit.        Physical Exam: BP (!) 146/68 (BP Location: Right Arm, Patient Position: Sitting, Cuff Size: Large)   Pulse 60   Resp 16   Ht 5' (1.524 m)   Wt 213 lb (96.6 kg)   SpO2 96% Comment: ON RA  BMI 41.60  kg/m   181/84 in the left arm Repeat blood pressures left arm was 145/84, right arm was 140/82  General appearance: alert, cooperative, appears stated age and no distress Neurologic: intact Heart: regular rate and rhythm, S1, S2 normal, no murmur, click, rub or gallop Lungs: clear to auscultation bilaterally Abdomen: soft, non-tender; bowel sounds normal; no masses,  no organomegaly Extremities: extremities normal, atraumatic, no cyanosis or edema and Homans sign is negative, no sign of DVT Wound: Sternum is stable and well healed  Patient has full brachial and radial pulses bilaterally  Diagnostic Studies & Laboratory data:     Recent Radiology Findings:   Ct Angio Chest Aorta W &/or Wo Contrast  Result Date: 09/21/2016 CLINICAL DATA:  Thoracic aortic aneurysm, status post aortic sleeve and valve replacement in 2017 EXAM: CT ANGIOGRAPHY CHEST  WITH CONTRAST TECHNIQUE: Multidetector CT imaging of the chest was performed using the standard protocol during bolus administration of intravenous contrast. Multiplanar CT image reconstructions and MIPs were obtained to evaluate the vascular anatomy. CONTRAST:  75 cc Isovue 370 Creatinine was obtained on site at [facility] at [address] Results: Creatinine 0.8 mg/dL. COMPARISON:  01/10/2016 FINDINGS: Cardiovascular: The patient is now status post aortic valve and ascending aortic sleeve replacement. The distal anastomosis can be seen in the upper ascending aorta. There is a ring of fabric surrounding the distal anastomosis. See image 38 of series 3. There is a triangular area of increased density anterior to the aortic arch on image 31 of series 3 that is most likely related to postoperative fabric and healing rather then contrast extravasation. Maximal diameter of the ascending aorta is 3.1 cm compared with 4.6 cm on the prior study. There is no definitive pseudoaneurysm or contrast extravasation. Mild atherosclerotic calcification in the aortic arch Great vessels are patent within the confines of the exam. This includes patency of the vertebral arteries. Mild calcification in the left main coronary artery, lad coronary artery, and circumflex coronary artery. Normal heart size. Hypertrophy of the left ventricle musculature is suspected. No obvious pulmonary thromboembolism. Mediastinum/Nodes: Mildly enlarged paratracheal lymph nodes are present and are likely related to postoperative healing. 10 mm paratracheal node on image 27. 10 mm paratracheal node on image 29. Other scattered smaller mediastinal nodes are present left supraclavicular node on image 13 measures 8 mm. No pericardial effusion. Normal thyroid gland. Lungs/Pleura: No pneumothorax or pleural effusion. Mild hypoaeration changes characterize by linear atelectasis and scattered ground-glass. Upper Abdomen: No acute abnormality.  Musculoskeletal: Post sternotomy changes. No vertebral compression deformity. Review of the MIP images confirms the above findings. IMPRESSION: The patient is now status post aortic valve and ascending aortic sleeve replacement. Postop changes are noted. There is no evidence of complication. There is slightly hyperdense fabric surrounding the distal anastomosis to the aortic arch. There is a triangular area of increased density anterior to the aortic arch which is felt to represent postoperative change. Attention on the prior study is recommended. See image 31 of series 3. Mild mediastinal adenopathy is likely related to postoperative healing. Aortic Atherosclerosis (ICD10-I70.0). Electronically Signed   By: Jolaine Click [name]   On: 09/21/2016 11:31        Recent Lab Findings: Lab Results  Component Value Date   WBC 12.1 (H) 01/20/2016   HGB 9.3 (L) 01/20/2016   HCT 27.5 (L) 01/20/2016   PLT 131 (L) 01/20/2016   GLUCOSE 86 05/08/2016   CHOL 188 02/29/2016   TRIG 127 02/29/2016   HDL  43 02/29/2016   LDLCALC 120 (H) 02/29/2016   ALT 20 02/29/2016   AST 21 02/29/2016   NA 136 05/08/2016   K 3.6 05/08/2016   CL 102 05/08/2016   CREATININE 0.71 05/08/2016   BUN 18 05/08/2016   CO2 27 05/08/2016   INR 1.73 01/17/2016   HGBA1C 5.3 01/14/2016   Echo: ------------------------------------------------------------------- Transthoracic Echocardiography  Patient:    Porschia, Willbanks MR #:       [id number] Study Date: 05/11/2016 Gender:     F Age:        71 Height:     152.4 cm Weight:     98.2 kg BSA:        2.1 m^2 Pt. Status: Room:   SONOGRAPHER  [employer], RVT, RDCS  ATTENDING    Prentice Docker, MD  [facility]     Patrick Jupiter, [name]  REFERRING    Patrick Jupiter, [name]  PERFORMING   [facility] [place]  cc:  ------------------------------------------------------------------- LV EF: 60% -   65%  ------------------------------------------------------------------- History:    PMH:  Acquired from the patient and from the patient&'s chart.  Aortic valve disease. s/p aortic valve replacement with a 21 mm [employer] Ease pericardial bioprosthesis valve, and supracoronary replacement of the ascending aorta on 01/18/16.  Risk factors:  Former tobacco use. Hypertension. Dyslipidemia.  ------------------------------------------------------------------- Study Conclusions  - Procedure narrative: Transthoracic echocardiography. Image   quality was adequate. The study was technically difficult, as a   result of poor acoustic windows, poor sound wave transmission,   and body habitus. - Left ventricle: The cavity size was normal. Wall thickness was   increased in a pattern of mild LVH. Systolic function was normal.   The estimated ejection fraction was in the range of 60% to 65%.   Diastolic dysfunction, grade indeterminate. Doppler parameters   are consistent with high ventricular filling pressure. - Aortic valve: Bioprosthetic valve [facility] Ease) by report.   Appears to be functioning normally. Peak velocity (S): 202 cm/s.   Mean gradient (S): 10 mm Hg. - Mitral valve: Calcified annulus.  ------------------------------------------------------------------- Study data:   Study status:  Routine.  Procedure:  Transthoracic echocardiography. Image quality was adequate. The study was technically difficult, as a result of poor acoustic windows, poor sound wave transmission, and body habitus.  Study completion: There were no complications.          Transthoracic echocardiography.  M-mode, complete 2D, spectral Doppler, and color Doppler.  Birthdate:  Patient birthdate: 1945-03-13.  Age:  Patient is 71 yr old.  Sex:  Gender: female.    BMI: 42.3 kg/m^2.  Blood pressure:     148/80  Patient status:  Outpatient.  Study date: Study date: 05/11/2016. Study time: 03:26  PM.  -------------------------------------------------------------------  ------------------------------------------------------------------- Left ventricle:  The cavity size was normal. Wall thickness was increased in a pattern of mild LVH. Systolic function was normal. The estimated ejection fraction was in the range of 60% to 65%. Images were inadequate for LV wall motion assessment. Diastolic dysfunction, grade indeterminate. Doppler parameters are consistent with high ventricular filling pressure.  ------------------------------------------------------------------- Aortic valve:  Bioprosthetic valve [facility] Ease) by report. Appears to be functioning normally. Poorly visualized.  Doppler: There was no significant regurgitation.    VTI ratio of LVOT to aortic valve: 0.64. Valve area (VTI): 1.63 cm^2. Indexed valve area (VTI): 0.78 cm^2/m^2. Peak velocity ratio of LVOT to aortic valve: 0.7. Valve area (Vmax): 1.79 cm^2. Indexed valve area (Vmax): 0.85 cm^2/m^2. Mean  velocity ratio of LVOT to aortic valve: 0.61. Valve area (Vmean): 1.54 cm^2. Indexed valve area (Vmean): 0.74 cm^2/m^2.    Mean gradient (S): 10 mm Hg. Peak gradient (S): 16 mm Hg.  ------------------------------------------------------------------- Aorta:  Aortic root: The aortic root was normal in size. Ascending aorta: The ascending aorta was normal in size.  ------------------------------------------------------------------- Mitral valve:   Calcified annulus.  Doppler:  There was no significant regurgitation.    Peak gradient (D): 5 mm Hg.  ------------------------------------------------------------------- Left atrium:  The atrium was normal in size.  ------------------------------------------------------------------- Atrial septum:  No defect or patent foramen ovale was identified.   ------------------------------------------------------------------- Right ventricle:  The cavity size was normal.  Wall thickness was normal. Systolic function was normal.  ------------------------------------------------------------------- Pulmonic valve:   Poorly visualized.  Doppler:  There was no significant regurgitation.  ------------------------------------------------------------------- Tricuspid valve:   The valve appears to be grossly normal. Doppler:  There was trivial regurgitation.  ------------------------------------------------------------------- Right atrium:  The atrium was normal in size.  ------------------------------------------------------------------- Pericardium:  There was no pericardial effusion.  ------------------------------------------------------------------- Systemic veins: Inferior vena cava: The vessel was normal in size. The respirophasic diameter changes were in the normal range (>= 50%), consistent with normal central venous pressure.  ------------------------------------------------------------------- Measurements   Left ventricle                           Value          Reference  LV ID, ED, PLAX chordal          (L)     37.8  mm       43 - 52  LV ID, ES, PLAX chordal          (L)     21.7  mm       23 - 38  LV fx shortening, PLAX chordal           43    %        >=29  LV PW thickness, ED                      11.9  mm       ----------  IVS/LV PW ratio, ED                      0.9            <=1.3  Stroke volume, 2D                        71    ml       ----------  Stroke volume/bsa, 2D                    34    ml/m^2   ----------  LV ejection fraction, 1-p A4C            87    %        ----------  LV e&', lateral                           10.2  cm/s     ----------  LV E/e&', lateral                         10.88          ----------  LV e&', medial                            6.09  cm/s     ----------  LV E/e&', medial                          18.23          ----------  LV e&', average                           8.15  cm/s     ----------  LV  E/e&', average                         13.63          ----------    Ventricular septum                       Value          Reference  IVS thickness, ED                        10.7  mm       ----------    LVOT                                     Value          Reference  LVOT ID, S                               18    mm       ----------  LVOT area                                2.54  cm^2     ----------  LVOT peak velocity, S                    142   cm/s     ----------  LVOT mean velocity, S                    88.6  cm/s     ----------  LVOT VTI, S                              28    cm       ----------  LVOT peak gradient, S                    8     mm Hg    ----------    Aortic valve                             Value          Reference  Aortic valve peak velocity, S            202   cm/s     ----------  Aortic valve mean velocity, S  146   cm/s     ----------  Aortic valve VTI, S                      43.5  cm       ----------  Aortic mean gradient, S                  10    mm Hg    ----------  Aortic peak gradient, S                  16    mm Hg    ----------  VTI ratio, LVOT/AV                       0.64           ----------  Aortic valve area, VTI                   1.63  cm^2     ----------  Aortic valve area/bsa, VTI               0.78  cm^2/m^2 ----------  Velocity ratio, peak, LVOT/AV            0.7            ----------  Aortic valve area, peak velocity         1.79  cm^2     ----------  Aortic valve area/bsa, peak              0.85  cm^2/m^2 ----------  velocity  Velocity ratio, mean, LVOT/AV            0.61           ----------  Aortic valve area, mean velocity         1.54  cm^2     ----------  Aortic valve area/bsa, mean              0.74  cm^2/m^2 ----------  velocity    Aorta                                    Value          Reference  Aortic root ID, ED                       28    mm       ----------  Ascending aorta ID, A-P, S               29    mm        ----------    Left atrium                              Value          Reference  LA ID, A-P, ES                           36    mm       ----------  LA ID/bsa, A-P                           1.72  cm/m^2   <=2.2  LA volume, S  54.6  ml       ----------  LA volume/bsa, S                         26    ml/m^2   ----------  LA volume, ES, 1-p A4C                   49.4  ml       ----------  LA volume/bsa, ES, 1-p A4C               23.6  ml/m^2   ----------  LA volume, ES, 1-p A2C                   53.8  ml       ----------  LA volume/bsa, ES, 1-p A2C               25.7  ml/m^2   ----------    Mitral valve                             Value          Reference  Mitral E-wave peak velocity              111   cm/s     ----------  Mitral A-wave peak velocity              100   cm/s     ----------  Mitral deceleration time         (H)     296   ms       150 - 230  Mitral peak gradient, D                  5     mm Hg    ----------  Mitral E/A ratio, peak                   1.1            ----------    Right atrium                             Value          Reference  RA ID, S-I, ES, A4C              (H)     51.2  mm       34 - 49  RA area, ES, A4C                         18.3  cm^2     8.3 - 19.5  RA volume, ES, A/L                       53.5  ml       ----------  RA volume/bsa, ES, A/L                   25.5  ml/m^2   ----------    Right ventricle                          Value          Reference  RV s&', lateral, S  11.3  cm/s     ----------    Pulmonic valve                           Value          Reference  Pulmonic valve peak velocity, S          110   cm/s     ----------  Legend: (L)  and  (H)  mark values outside specified reference range.  ------------------------------------------------------------------- Prepared and Electronically Authenticated by  Prentice Docker, MD 2018-03-08T16:32:39  Assessment / Plan:      Patient  stable after aortic valve replacement and supra coronary replacement of her ascending aorta for severe aortic stenosis and dilated ascending aorta. The patient has had good symptomatic relief from her valve replacement . CTA of the chest shows no postoperative complications. Plan to see the back as needed only followed every 6 months in the cardiology office.  With the patient's prosthetic heart valve the risks of endocarditis have been discussed. The recommendations for periprocedural antibiotics including dental procedures have been discussed with the patient. She plans to see the dentist in several weeks.   Delight Ovens MD      [address] [place] [place] [address] Office [phone]   Beeper [phone]  09/21/2016 11:56 AM

## 2016-10-02 ENCOUNTER — Other Ambulatory Visit: Payer: Self-pay | Admitting: Physician Assistant

## 2016-10-03 ENCOUNTER — Other Ambulatory Visit: Payer: Self-pay | Admitting: Physician Assistant

## 2016-10-10 ENCOUNTER — Telehealth: Payer: Self-pay | Admitting: Cardiology

## 2016-10-10 NOTE — Telephone Encounter (Signed)
Returned call - left detailed message that nurse was trying to clarify Lisinopril dose.  [facility] .  Our medication list shows 10mg .  No further action needed if patient received medication from her mail order.

## 2016-10-10 NOTE — Telephone Encounter (Signed)
Mrs. Angela Dawson called stating that she received a telephone call from our office today.   Patient states that she has recently received her RX of Lisinopril from [facility]. [phone].

## 2016-10-18 ENCOUNTER — Encounter: Payer: Self-pay | Admitting: Nurse Practitioner

## 2016-10-18 ENCOUNTER — Ambulatory Visit (INDEPENDENT_AMBULATORY_CARE_PROVIDER_SITE_OTHER): Payer: Medicare Other | Admitting: Nurse Practitioner

## 2016-10-18 VITALS — BP 154/76 | HR 64 | Temp 97.4°F | Ht 60.0 in | Wt 221.0 lb

## 2016-10-18 DIAGNOSIS — Z952 Presence of prosthetic heart valve: Secondary | ICD-10-CM | POA: Diagnosis not present

## 2016-10-18 DIAGNOSIS — E782 Mixed hyperlipidemia: Secondary | ICD-10-CM

## 2016-10-18 DIAGNOSIS — I10 Essential (primary) hypertension: Secondary | ICD-10-CM | POA: Diagnosis not present

## 2016-10-18 MED ORDER — METOPROLOL TARTRATE 25 MG PO TABS: 12.5000 mg | ORAL_TABLET | Freq: Two times a day (BID) | ORAL | 1 refills | Status: DC

## 2016-10-18 MED ORDER — LISINOPRIL 10 MG PO TABS: 10.0000 mg | ORAL_TABLET | Freq: Every day | ORAL | 1 refills | Status: DC

## 2016-10-18 NOTE — Progress Notes (Addendum)
Subjective:    Patient ID: Angela Dawson, female    DOB: August 16, 1945, 71 y.o.   MRN: [id number]  HPI  Angela Dawson is here today for follow up of chronic medical problem. Has not taken her meds today  Outpatient Encounter Prescriptions as of 10/18/2016  Medication Sig  . acetaminophen (TYLENOL) 325 MG tablet Take 2 tablets (650 mg total) by mouth every 6 (six) hours as needed for mild pain.  Marland Kitchen aspirin EC 325 MG EC tablet Take 1 tablet (325 mg total) by mouth daily.  . cetirizine (ZYRTEC) 10 MG tablet TAKE 1 TABLET EVERY DAY  . lisinopril (PRINIVIL,ZESTRIL) 10 MG tablet Take 1 tablet (10 mg total) by mouth daily.  . metoprolol tartrate (LOPRESSOR) 25 MG tablet Take 0.5 tablets (12.5 mg total) by mouth 2 (two) times daily.    1. Essential hypertension  No c/o chest pain, SOB or headache. Does not check blood pressure at home  2. Severe obesity (BMI >= 40) (HCC)  Weight up 8 lbs since last visit  3. S/P AVR (aortic valve replacement)  Patient sees cardiologist yearly  4. Mixed hyperlipidemia  Does ot watch diet very closely    New complaints: none     Review of Systems  Constitutional: Negative for activity change and appetite change.  HENT: Negative.   Eyes: Negative for pain.  Respiratory: Negative for shortness of breath.   Cardiovascular: Negative for chest pain, palpitations and leg swelling.  Gastrointestinal: Negative for abdominal pain.  Endocrine: Negative for polydipsia.  Genitourinary: Negative.   Skin: Negative for rash.  Neurological: Negative for dizziness, weakness and headaches.  Hematological: Does not bruise/bleed easily.  Psychiatric/Behavioral: Negative.   All other systems reviewed and are negative.      Objective:   Physical Exam  Constitutional: She is oriented to person, place, and time. She appears well-developed and well-nourished.  HENT:  Nose: Nose normal.  Mouth/Throat: Oropharynx is clear and moist.  Eyes: EOM are normal.  Neck:  Trachea normal, normal range of motion and full passive range of motion without pain. Neck supple. No JVD present. Carotid bruit is not present. No thyromegaly present.  Cardiovascular: Normal rate, regular rhythm, normal heart sounds and intact distal pulses.  Exam reveals no gallop and no friction rub.   No murmur heard. Pulmonary/Chest: Effort normal and breath sounds normal.  Abdominal: Soft. Bowel sounds are normal. She exhibits no distension and no mass. There is no tenderness.  Musculoskeletal: Normal range of motion.  Lymphadenopathy:    She has no cervical adenopathy.  Neurological: She is alert and oriented to person, place, and time. She has normal reflexes.  Skin: Skin is warm and dry.  Psychiatric: She has a normal mood and affect. Her behavior is normal. Judgment and thought content normal.   BP (!) 154/76   Pulse 64   Temp (!) 97.4 F (36.3 C) (Oral)   Ht 5' (1.524 m)   Wt 221 lb (100.2 kg)   BMI 43.16 kg/m         Assessment & Plan:  1. Essential hypertension Low sodium diet - lisinopril (PRINIVIL,ZESTRIL) 10 MG tablet; Take 1 tablet (10 mg total) by mouth daily.  Dispense: 90 tablet; Refill: 1 - CMP14+EGFR  2. Severe obesity (BMI >= 40) (HCC) Discussed diet and exercise for person with BMI >25 Will recheck weight in 3-6 months  3. S/P AVR (aortic valve replacement) Needs to keep follow up appointments with cardiology - metoprolol tartrate (LOPRESSOR) 25  MG tablet; Take 0.5 tablets (12.5 mg total) by mouth 2 (two) times daily.  Dispense: 90 tablet; Refill: 1  4. Mixed hyperlipidemia Low fat diet - Lipid panel   Keep mammogram appointment Labs pending Health maintenance reviewed Diet and exercise encouraged Continue all meds Follow up  In 6 months   [place], FNP

## 2016-10-18 NOTE — Patient Instructions (Signed)
Fat and Cholesterol Restricted Diet Getting too much fat and cholesterol in your diet may cause health problems. Following this diet helps keep your fat and cholesterol at normal levels. This can keep you from getting sick. What types of fat should I choose?  Choose monosaturated and polyunsaturated fats. These are found in foods such as olive oil, canola oil, flaxseeds, walnuts, almonds, and seeds.  Eat more omega-3 fats. Good choices include salmon, mackerel, sardines, tuna, flaxseed oil, and ground flaxseeds.  Limit saturated fats. These are in animal products such as meats, butter, and cream. They can also be in plant products such as palm oil, palm kernel oil, and coconut oil.  Avoid foods with partially hydrogenated oils in them. These contain trans fats. Examples of foods that have trans fats are stick margarine, some tub margarines, cookies, crackers, and other baked goods. What general guidelines do I need to follow?  Check food labels. Look for the words "trans fat" and "saturated fat."  When preparing a meal: ? Fill half of your plate with vegetables and green salads. ? Fill one fourth of your plate with whole grains. Look for the word "whole" as the first word in the ingredient list. ? Fill one fourth of your plate with lean protein foods.  Eat more foods that have fiber, like apples, carrots, beans, peas, and barley.  Eat more home-cooked foods. Eat less at restaurants and buffets.  Limit or avoid alcohol.  Limit foods high in starch and sugar.  Limit fried foods.  Cook foods without frying them. Baking, boiling, grilling, and broiling are all great options.  Lose weight if you are overweight. Losing even a small amount of weight can help your overall health. It can also help prevent diseases such as diabetes and heart disease. What foods can I eat? Grains Whole grains, such as whole wheat or whole grain breads, crackers, cereals, and pasta. Unsweetened oatmeal,  bulgur, barley, quinoa, or brown rice. Corn or whole wheat flour tortillas. Vegetables Fresh or frozen vegetables (raw, steamed, roasted, or grilled). Green salads. Fruits All fresh, canned (in natural juice), or frozen fruits. Meat and Other Protein Products Ground beef (85% or leaner), grass-fed beef, or beef trimmed of fat. Skinless chicken or turkey. Ground chicken or turkey. Pork trimmed of fat. All fish and seafood. Eggs. Dried beans, peas, or lentils. Unsalted nuts or seeds. Unsalted canned or dry beans. Dairy Low-fat dairy products, such as skim or 1% milk, 2% or reduced-fat cheeses, low-fat ricotta or cottage cheese, or plain low-fat yogurt. Fats and Oils Tub margarines without trans fats. Light or reduced-fat mayonnaise and salad dressings. Avocado. Olive, canola, sesame, or safflower oils. Natural peanut or almond butter (choose ones without added sugar and oil). The items listed above may not be a complete list of recommended foods or beverages. Contact your dietitian for more options. What foods are not recommended? Grains White bread. White pasta. White rice. Cornbread. Bagels, pastries, and croissants. Crackers that contain trans fat. Vegetables White potatoes. Corn. Creamed or fried vegetables. Vegetables in a cheese sauce. Fruits Dried fruits. Canned fruit in light or heavy syrup. Fruit juice. Meat and Other Protein Products Fatty cuts of meat. Ribs, chicken wings, bacon, sausage, bologna, salami, chitterlings, fatback, hot dogs, bratwurst, and packaged luncheon meats. Liver and organ meats. Dairy Whole or 2% milk, cream, half-and-half, and cream cheese. Whole milk cheeses. Whole-fat or sweetened yogurt. Full-fat cheeses. Nondairy creamers and whipped toppings. Processed cheese, cheese spreads, or cheese curds. Sweets and Desserts Corn   syrup, sugars, honey, and molasses. Candy. Jam and jelly. Syrup. Sweetened cereals. Cookies, pies, cakes, donuts, muffins, and ice  cream. Fats and Oils Butter, stick margarine, lard, shortening, ghee, or bacon fat. Coconut, palm kernel, or palm oils. Beverages Alcohol. Sweetened drinks (such as sodas, lemonade, and fruit drinks or punches). The items listed above may not be a complete list of foods and beverages to avoid. Contact your dietitian for more information. This information is not intended to replace advice given to you by your health care provider. Make sure you discuss any questions you have with your health care provider. Document Released: 08/22/2011 Document Revised: 10/28/2015 Document Reviewed: 05/22/2013 Elsevier Interactive Patient Education  2018 [employer] Inc.  

## 2016-10-19 LAB — CMP14+EGFR
A/G RATIO: 1.5 (ref 1.2–2.2)
ALT: 18 IU/L (ref 0–32)
AST: 17 IU/L (ref 0–40)
Albumin: 4 g/dL (ref 3.5–4.8)
Alkaline Phosphatase: 68 IU/L (ref 39–117)
BILIRUBIN TOTAL: 0.4 mg/dL (ref 0.0–1.2)
BUN/Creatinine Ratio: 17 (ref 12–28)
BUN: 13 mg/dL (ref 8–27)
CALCIUM: 9.2 mg/dL (ref 8.7–10.3)
CHLORIDE: 104 mmol/L (ref 96–106)
CO2: 26 mmol/L (ref 20–29)
Creatinine, Ser: 0.76 mg/dL (ref 0.57–1.00)
GFR calc Af Amer: 92 mL/min/{1.73_m2} (ref >59)
GFR, EST NON AFRICAN AMERICAN: 80 mL/min/{1.73_m2} (ref >59)
Globulin, Total: 2.7 g/dL (ref 1.5–4.5)
Glucose: 91 mg/dL (ref 65–99)
POTASSIUM: 4.3 mmol/L (ref 3.5–5.2)
Sodium: 144 mmol/L (ref 134–144)
Total Protein: 6.7 g/dL (ref 6.0–8.5)

## 2016-10-19 LAB — LIPID PANEL
CHOLESTEROL TOTAL: 206 mg/dL — AB (ref 100–199)
Chol/HDL Ratio: 4 ratio (ref 0.0–4.4)
HDL: 51 mg/dL (ref >39)
LDL Calculated: 135 mg/dL — ABNORMAL HIGH (ref 0–99)
TRIGLYCERIDES: 101 mg/dL (ref 0–149)
VLDL Cholesterol Cal: 20 mg/dL (ref 5–40)

## 2016-10-26 ENCOUNTER — Encounter: Payer: Self-pay | Admitting: Cardiology

## 2016-10-26 ENCOUNTER — Ambulatory Visit (INDEPENDENT_AMBULATORY_CARE_PROVIDER_SITE_OTHER): Payer: Medicare Other | Admitting: Cardiology

## 2016-10-26 VITALS — BP 140/78 | HR 65 | Ht 60.0 in | Wt 213.0 lb

## 2016-10-26 DIAGNOSIS — Z952 Presence of prosthetic heart valve: Secondary | ICD-10-CM

## 2016-10-26 DIAGNOSIS — I712 Thoracic aortic aneurysm, without rupture: Secondary | ICD-10-CM

## 2016-10-26 DIAGNOSIS — I10 Essential (primary) hypertension: Secondary | ICD-10-CM

## 2016-10-26 DIAGNOSIS — Q2381 Bicuspid aortic valve: Secondary | ICD-10-CM

## 2016-10-26 DIAGNOSIS — I7121 Aneurysm of the ascending aorta, without rupture: Secondary | ICD-10-CM

## 2016-10-26 DIAGNOSIS — Q231 Congenital insufficiency of aortic valve: Secondary | ICD-10-CM | POA: Diagnosis not present

## 2016-10-26 DIAGNOSIS — E782 Mixed hyperlipidemia: Secondary | ICD-10-CM | POA: Diagnosis not present

## 2016-10-26 NOTE — Progress Notes (Signed)
Clinical Summary Angela Dawson is a 71 y.o.female seen today for follow up of the following medical problems.   1. Aortic stenosis with bicuspid AV and ascending aortic aneurysm.  - 09/2013 echo showed LVEF 65-70%, grade I diastolic dysfunction. Bicuspid AV with moderate to severe AS (mean grad 21, dimensionless index 0.32, valve area 0.93), mild AI. Described mild aorta dilatation from limited views, ascending aorta 4.6 cm. - CTA chest 05/2014 with asscending thoracic aortic aneurysm 4.9 cm.  01/2015 CTA chest ascending aortic aneurysm 4.6 x 4.6 cm. Forllowed by Dr Tyrone Sage.  -06/2014 echo LVEF 65-70%, AVA area 0.91, mean grad 32 - 09/2015 echo LVEF 60-65%, mod to severe AS mean grad 34, AVA 1 -09/2015 cath which showed no significant CAD   -she is s/p AVR with Edwards Lifesciene pericardial tissue valve 23mm and supracoronary replacement of the ascending aorta on 01/18/16   - no recent SOB, no significant DOE. She has completed cardiac rehab - compliant with meds  2.HTN - she remains compliant with meds  3. Hyperlipidemia - allergic to statins. 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 Though she recently retired. [place] trip, then senior citizens trip to [place] with her friend.  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 Upcoming trip to [place] in October.    Past Medical History:  Diagnosis Date  . Allergy    seasonal  . Chicken pox   . Complication of anesthesia    difficult to wake up after tubal ligations  . Dyspnea    with exertion  . Heart murmur   . History of bronchitis   . History of kidney stones    "only one"  . Hypertension   . Measles      Allergies  Allergen Reactions  . Crestor [Rosuvastatin]     Dizziness  . Lipitor [Atorvastatin]     Unable to stand or move  . Statins     Leg cramps   . Penicillins Rash    Has patient  had a PCN reaction causing immediate rash, facial/tongue/throat swelling, SOB or lightheadedness with hypotension: Yes Has patient had a PCN reaction causing severe rash involving mucus membranes or skin necrosis: No Has patient had a PCN reaction that required hospitalization No Has patient had a PCN reaction occurring within the last 10 years: No If all of the above answers are "NO", then may proceed with Cephalosporin use.      Current Outpatient Prescriptions  Medication Sig Dispense Refill  . acetaminophen (TYLENOL) 325 MG tablet Take 2 tablets (650 mg total) by mouth every 6 (six) hours as needed for mild pain.    Marland Kitchen aspirin EC 325 MG EC tablet Take 1 tablet (325 mg total) by mouth daily. 30 tablet 0  . cetirizine (ZYRTEC) 10 MG tablet TAKE 1 TABLET EVERY DAY 90 tablet 1  . lisinopril (PRINIVIL,ZESTRIL) 10 MG tablet Take 1 tablet (10 mg total) by mouth daily. 90 tablet 1  . metoprolol tartrate (LOPRESSOR) 25 MG tablet Take 0.5 tablets (12.5 mg total) by mouth 2 (two) times daily. 90 tablet 1   No current facility-administered medications for this visit.      Past Surgical History:  Procedure Laterality Date  . AORTIC VALVE REPLACEMENT N/A 01/17/2016   Procedure: AORTIC VALVE REPLACEMENT (AVR) USING EDWARDS MAGNA EASE PERICARDIAL BIOPROSTHESIS VALVE;  Surgeon: Delight Ovens, MD;  Location: [facility] OR;  Service: Open Heart Surgery;  Laterality: N/A;  . CARDIAC CATHETERIZATION N/A 09/24/2015   Procedure: Right/Left Heart Cath and Coronary Angiography;  Surgeon: Corky Crafts, MD;  Location: [facility] INVASIVE CV LAB;  Service: Cardiovascular;  Laterality: N/A;  . COLONOSCOPY    . REPLACEMENT ASCENDING AORTA N/A 01/17/2016   Procedure: REPLACEMENT ASCENDING AORTA USING HEMASHIELD PLATINUM WOVEN DOUBLE VELOUR VASCULAR GRAFT;  Surgeon: Delight Ovens, MD;  Location: [facility] OR;  Service: Open Heart Surgery;  Laterality: N/A;  . TEE WITHOUT CARDIOVERSION N/A 01/17/2016   Procedure:  TRANSESOPHAGEAL ECHOCARDIOGRAM (TEE);  Surgeon: Delight Ovens, MD;  Location: [facility] OR;  Service: Open Heart Surgery;  Laterality: N/A;  . TUBAL LIGATION       Allergies  Allergen Reactions  . Crestor [Rosuvastatin]     Dizziness  . Lipitor [Atorvastatin]     Unable to stand or move  . Statins     Leg cramps   . Penicillins Rash    Has patient had a PCN reaction causing immediate rash, facial/tongue/throat swelling, SOB or lightheadedness with hypotension: Yes Has patient had a PCN reaction causing severe rash involving mucus membranes or skin necrosis: No Has patient had a PCN reaction that required hospitalization No Has patient had a PCN reaction occurring within the last 10 years: No If all of the above answers are "NO", then may proceed with Cephalosporin use.       Family History  Problem Relation Age of Onset  . Alzheimer's disease Mother   . Heart disease Mother   . Heart attack Father 62     Social History Ms. Allocca reports that she quit smoking about 43 years ago. Her smoking use included Cigarettes. She started smoking about 48 years ago. She has a 2.50 pack-year smoking history. She has never used smokeless tobacco. Ms. Decaire reports that she does not drink alcohol.   Review of Systems CONSTITUTIONAL: No weight loss, fever, chills, weakness or fatigue.  HEENT: Eyes: No visual loss, blurred vision, double vision or yellow sclerae.No hearing loss, sneezing, congestion, runny nose or sore throat.  SKIN: No rash or itching.  CARDIOVASCULAR: per hpi RESPIRATORY: No shortness of breath, cough or sputum.  GASTROINTESTINAL: No anorexia, nausea, vomiting or diarrhea. No abdominal pain or blood.  GENITOURINARY: No burning on urination, no polyuria NEUROLOGICAL: No headache, dizziness, syncope, paralysis, ataxia, numbness or tingling in the extremities. No change in bowel or bladder control.  MUSCULOSKELETAL: No muscle, back pain, joint pain or stiffness.    LYMPHATICS: No enlarged nodes. No history of splenectomy.  PSYCHIATRIC: No history of depression or anxiety.  ENDOCRINOLOGIC: No reports of sweating, cold or heat intolerance. No polyuria or polydipsia.  Marland Kitchen   Physical Examination Vitals:   10/26/16 1027  BP: 140/78  Pulse: 65  SpO2: 97%   Vitals:   10/26/16 1027  Weight: 213 lb (96.6 kg)  Height: 5' (1.524 m)    Gen: resting comfortably, no acute distress HEENT: no scleral icterus, pupils equal round and reactive, no palptable cervical adenopathy,  CV: RRR, no m/r/g, no jvd Resp: Clear to auscultation bilaterally GI: abdomen is soft, non-tender, non-distended, normal bowel sounds, no hepatosplenomegaly MSK: extremities are warm, no edema.  Skin: warm, no rash Neuro:  no focal deficits Psych: appropriate affect   Diagnostic Studies 09/2013 Echo Study Conclusions  - Left ventricle: The cavity size was normal. Wall thickness was increased in a pattern of mild LVH.  Systolic function was vigorous. The estimated ejection fraction was in the range of 65% to 70%. Wall motion was normal; there were no regional wall motion abnormalities. Doppler parameters are consistent with abnormal left ventricular relaxation (grade 1 diastolic dysfunction). - Aortic valve: Bicuspid; moderately calcified leaflets. Cusp separation was reduced. There was moderate to severe stenosis. There was mild regurgitation. Mean gradient (S): 21 mm Hg. LVOT/AV VTI ratio 0.32. Peak gradient (S): 40 mm Hg. Valve area (VTI): 0.93 cm^2. Valve area (Vmax): 0.96 cm^2. - Ascending aorta: The ascending aorta was moderately dilated. Approximately 46 mm. - Mitral valve: Calcified annulus. There was trivial regurgitation. - Right atrium: Central venous pressure (est): 3 mm Hg. - Atrial septum: No defect or patent foramen ovale was identified. - Tricuspid valve: There was trivial regurgitation. - Pulmonary arteries: PA peak pressure: 29 mm Hg (S). - Pericardium,  extracardiac: There was no pericardial effusion.  Impressions:  - Mild LVH with LVEF 65-70%, grade 1 diastolic dysfunction. Moderate to severe aortic stenosis as outlined with bicuspid aortic valve and mild aortic regurgitation. There is also at least moderate dilatation of the ascending aorta based on limited views. Given association of aortic aneurysmal disease with bicuspid aortic valve, would suggest further dedicated imaging of the thoracic aorta with CTA or MRA. Normal PASP 29 mmHg.   05/2014 CTA chest IMPRESSION: Maximal diameter of the ascending aorta is 4.9 cm compare with 4.8 cm on the prior study. No evidence of dissection.  There are airspace and ground-glass opacities in the left upper lobe most consistent with an inflammatory process. There is also a 2 mm nodule. If the patient is at high risk for bronchogenic carcinoma, follow-up chest CT at 1 year is recommended. If the patient is at low risk, no follow-up is needed. This recommendation follows the consensus statement: Guidelines for Management of Small Pulmonary Nodules Detected on CT Scans: A Statement from the Fleischner Society as published in Radiology 2005; 237:395-400.   ADDENDUM: The patient therefore has a thoracic aortic aneurysm that has only increased from 4.8 cm to 4.9 cm since August of last year. Ascending thoracic aortic aneurysm. Recommend semi-annual imaging followup by CTA or MRA and referral to cardiothoracic surgery if not already obtained. This recommendation follows 2010 ACCF/AHA/AATS/ACR/ASA/SCA/SCAI/SIR/STS/SVM Guidelines for the Diagnosis and Management of Patients With Thoracic Aortic Disease. Circulation. 2010; 121: U981-X914   06/2014 Echo Study Conclusions  - Left ventricle: The cavity size was normal. Systolic function was vigorous. The estimated ejection fraction was in the range of 65% to 70%. Wall motion was normal; there were no regional wall motion  abnormalities. Features are consistent with a pseudonormal left ventricular filling pattern, with concomitant abnormal relaxation and increased filling pressure (grade 2 diastolic dysfunction). Doppler parameters are consistent with high ventricular filling pressure. Mild to moderate concentric left ventricular hypertrophy. - Aortic valve: Mildly to moderately calcified annulus. Moderately thickened, severely calcified leaflets. Cusp separation was reduced. There was moderate to severe stenosis. There was mild regurgitation. Peak velocity (S): 364 cm/s. Mean gradient: 32 mmHg. Valve area (Vmax): 0.91 cm^2. - Aorta: Ascending aortic diameter: 43 mm (S). Mild ascending aortic dilatation. - Mitral valve: Mildly to moderately calcified annulus. There was mild regurgitation.  Impressions:  - When compared to the report dated 10/02/13, the mean aortic valve gradient has increased by 11 mmHg. Moderate to severe stenosis is seen.  01/2015 CTA chest IMPRESSION: Dilatation of the thoracic aorta in its ascending component measuring 4.6 x 4.6 cm in greatest dimension. This is  likely stable from the prior exam as some motion artifact was seen on the prior study.  09/2015 echo Study Conclusions  - Left ventricle: The cavity size was normal. Wall thickness was  increased in a pattern of moderate LVH. Systolic function was  normal. The estimated ejection fraction was in the range of 60%  to 65%. Wall motion was normal; there were no regional wall  motion abnormalities. Features are consistent with a pseudonormal  left ventricular filling pattern, with concomitant abnormal  relaxation and increased filling pressure (grade 2 diastolic  dysfunction). Doppler parameters are consistent with high  ventricular filling pressure. - Aortic valve: Moderately thickened, severely calcified leaflets.  There was moderate to severe stenosis. There was mild   regurgitation. Peak velocity (S): 390 cm/s. Mean gradient (S): 34  mm Hg. Valve area (VTI): 1.01 cm^2. Valve area (Vmax): 0.89 cm^2.  Valve area (Vmean): 0.93 cm^2. - Aorta: Ascending aortic diameter: 46 mm (S). - Ascending aorta: The ascending aorta was mild to moderately  dilated. - Mitral valve: Calcified annulus. There was mild regurgitation. - Left atrium: The atrium was moderately dilated. - Right ventricle: Systolic function was mildly to moderately  reduced. - Tricuspid valve: There was mild-moderate regurgitation.  09/2015 Cath  The left ventricular systolic function is normal.  Nonobstructive CAD.  Normal PA pressures. CI 2.3; PA sat 68%.  Dilated ascending aorta.  Continue with plans for AVR and aneurysm repair    Assessment and Plan  1. Bicuspid aortic valve with aortic stenosis and aortic aneurysm - s/p tissue AVR and ascending aorta replacement - no recent symptoms, continue to monitor  2. HTN - bp is at goal, continue current meds  3. Hyperlipidemia - allergic to statins, zetia was too expensive. Continue dietary modificatoin.          Antoine Poche, [name]

## 2016-10-26 NOTE — Patient Instructions (Signed)
Your physician wants you to follow-up in: 6 MONTHS WITH DR BRANCH You will receive a reminder letter in the mail two months in advance. If you don't receive a letter, please call our office to schedule the follow-up appointment.  Your physician has recommended you make the following change in your medication:   DECREASE ASPIRIN 81 MG DAILY  Thank you for choosing [facility]!!     

## 2016-12-04 ENCOUNTER — Other Ambulatory Visit: Payer: Self-pay | Admitting: Nurse Practitioner

## 2017-02-07 ENCOUNTER — Other Ambulatory Visit: Payer: Self-pay | Admitting: Nurse Practitioner

## 2017-03-12 DIAGNOSIS — Z1231 Encounter for screening mammogram for malignant neoplasm of breast: Secondary | ICD-10-CM | POA: Diagnosis not present

## 2017-03-15 IMAGING — CR DG CHEST 2V
2 series · 2 of 2 positions shown · non-contrast
Comparison: PA and lateral chest x-ray January 20, 2016

CLINICAL DATA: Status post aortic valve replacement on January 17, 2016. No current chest complaints.

EXAM:
CHEST  2 VIEW

[w chest pa]
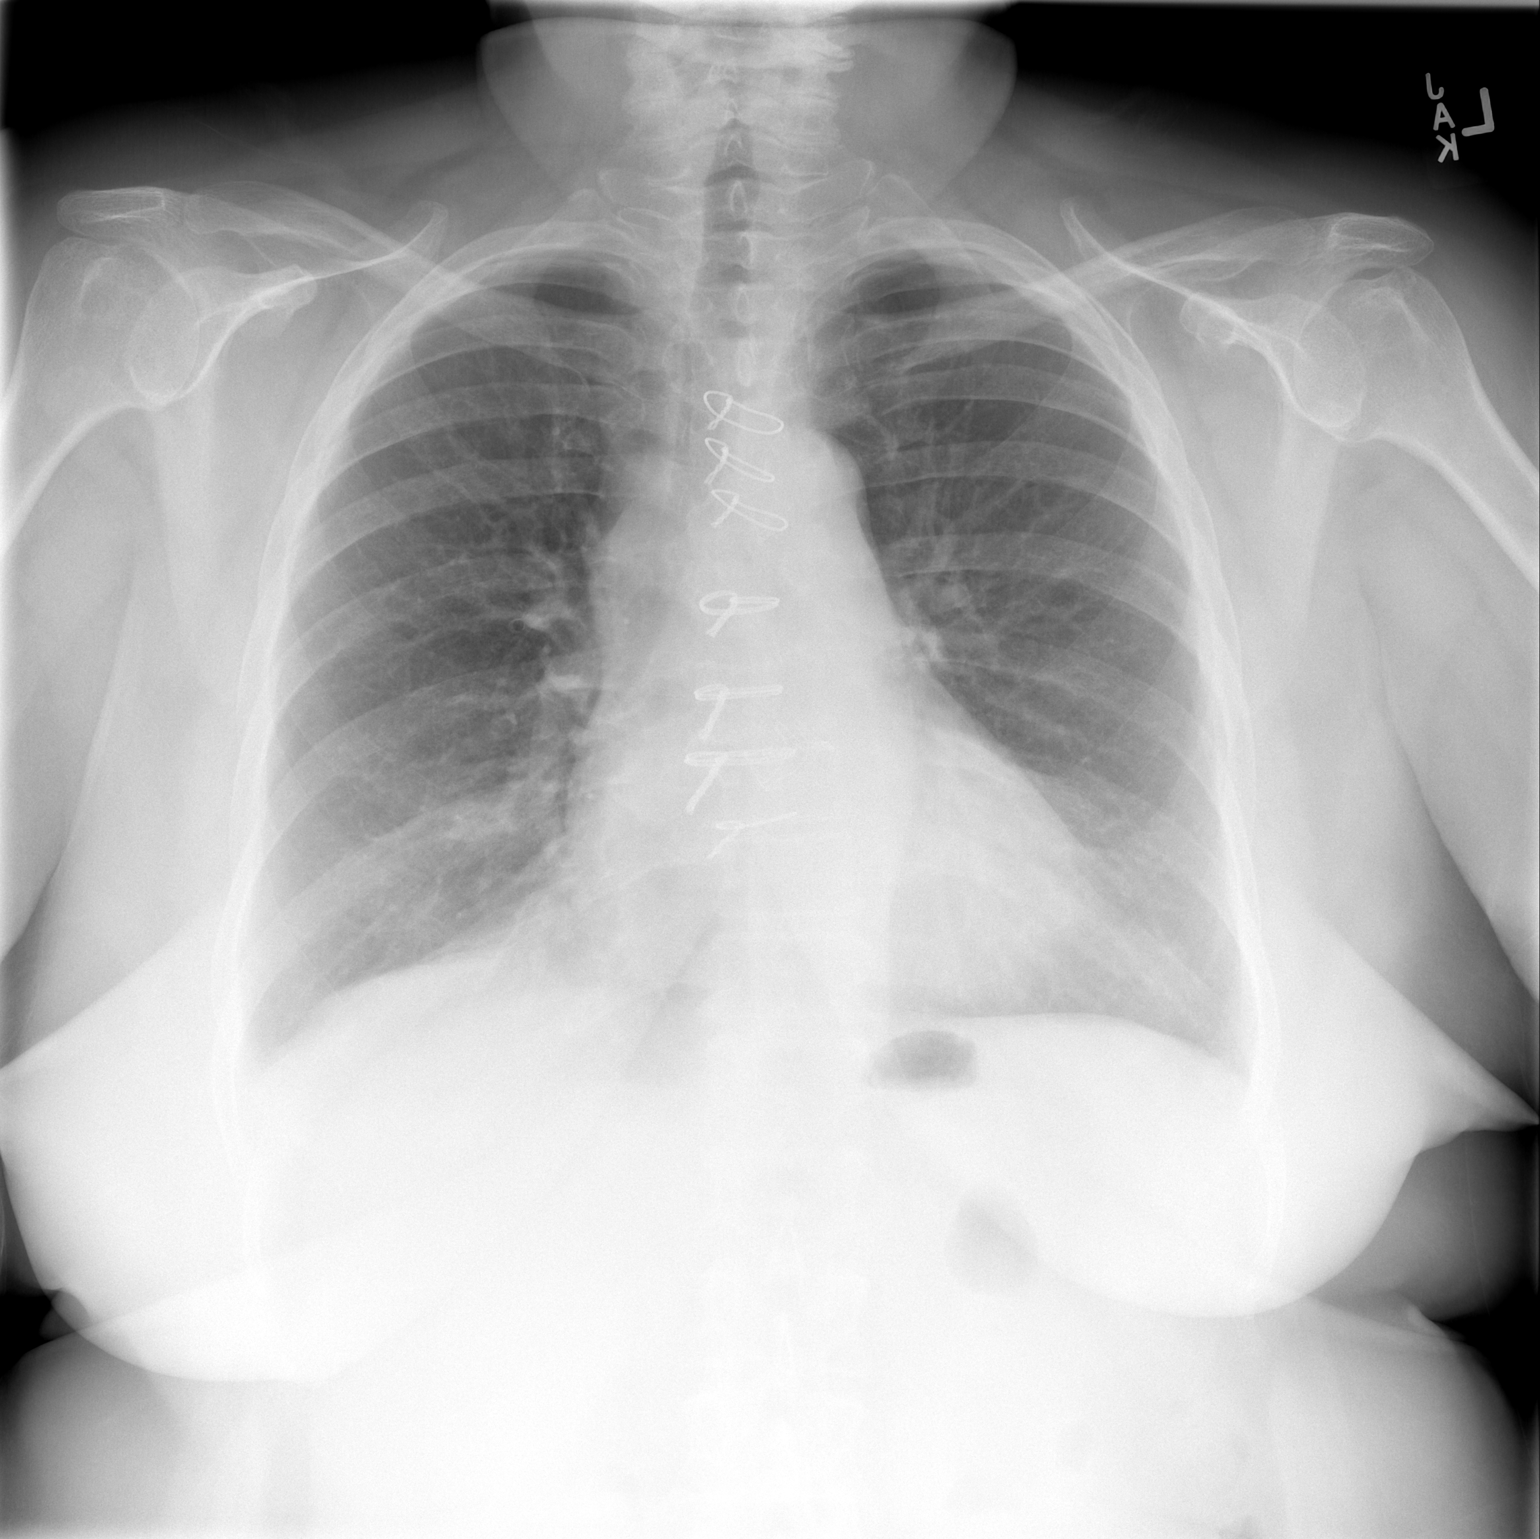

[w chest lat]
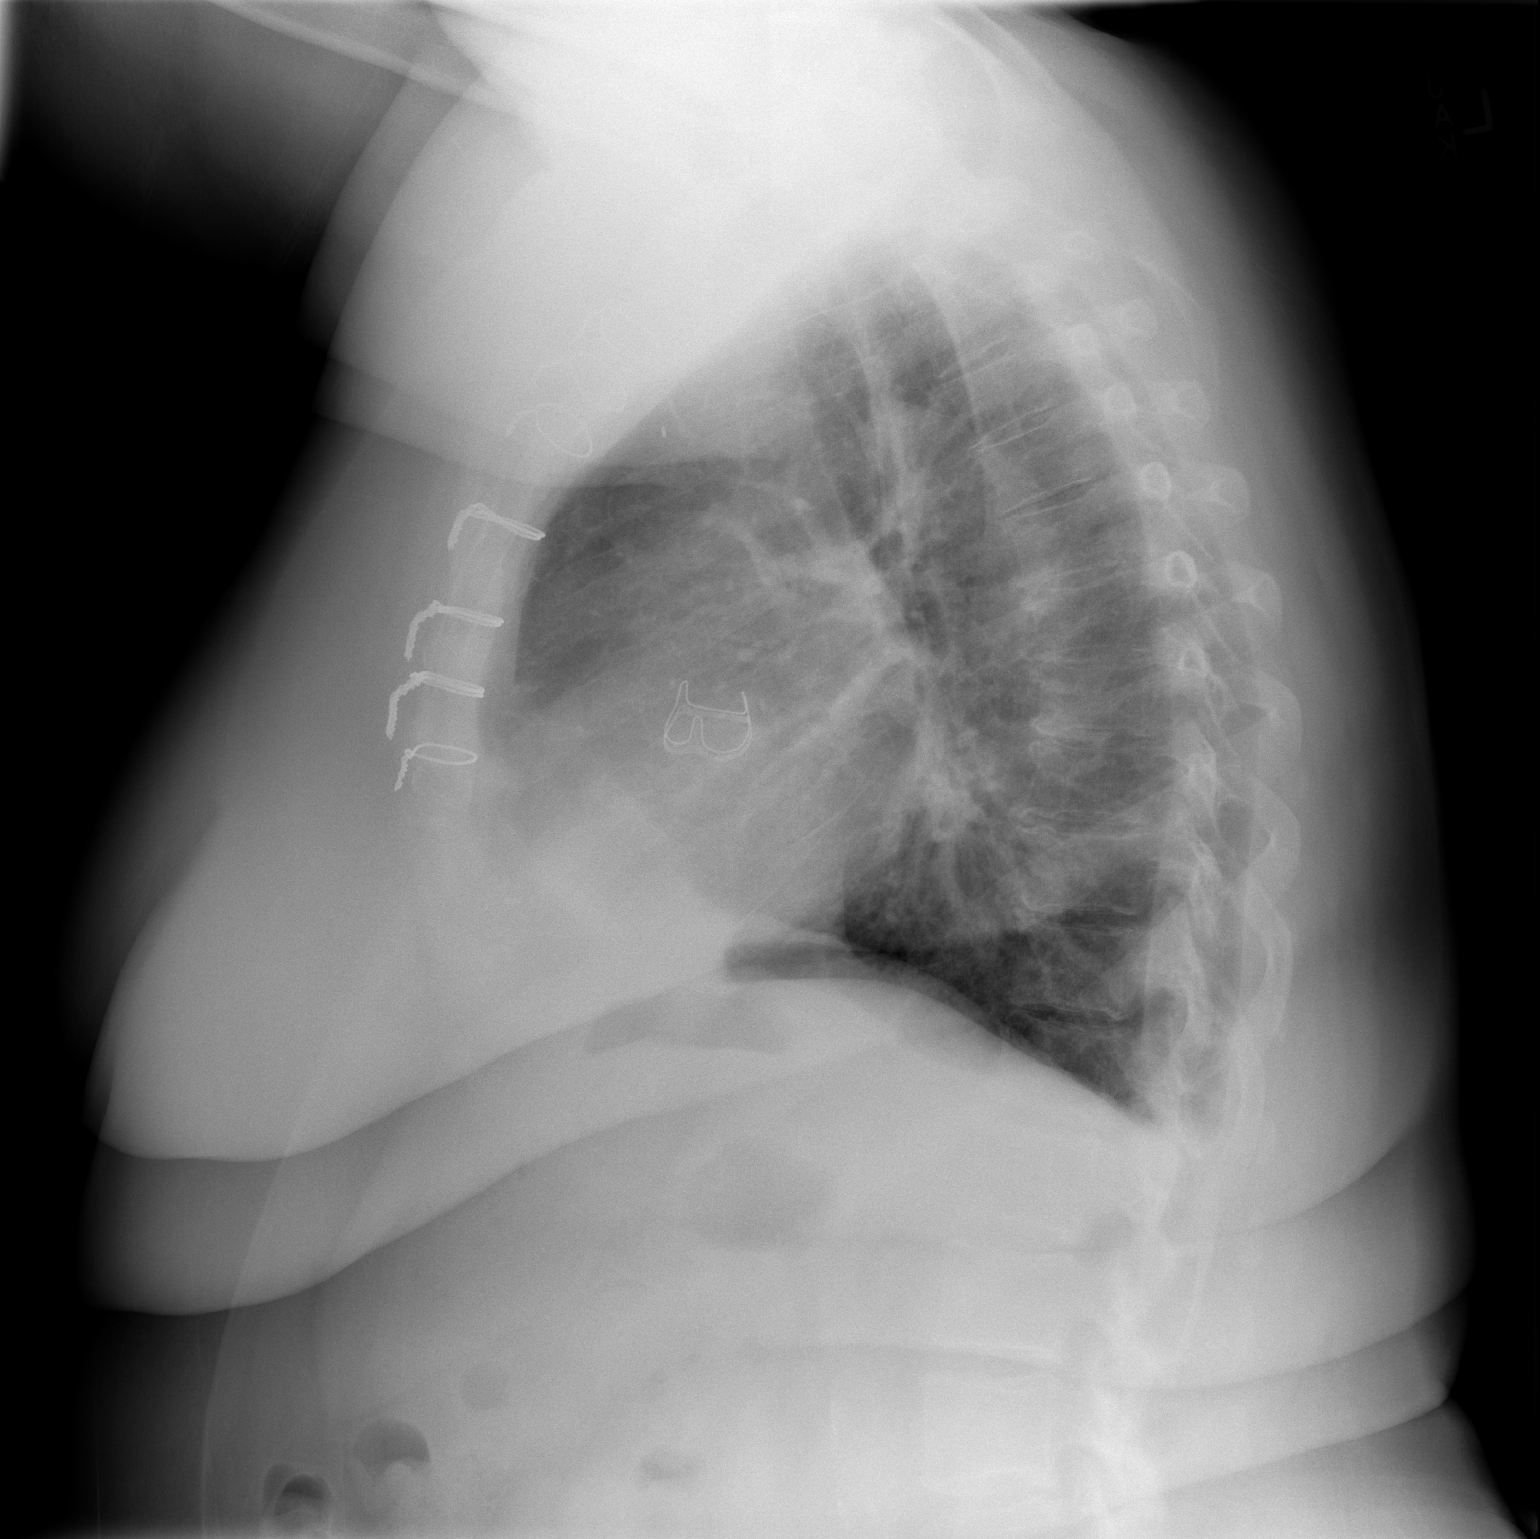

[2 of 2 positions shown; findings below may reference images not displayed]

FINDINGS: The lungs are adequately inflated. There is no focal infiltrate. The
lung markings remain coarse in the right infrahilar region. There is
no pleural effusion or pneumothorax. The heart and pulmonary
vascularity are normal. The mediastinum is normal in width. The
sternal wires are intact. The bony thorax exhibits no acute
abnormality.
IMPRESSION: No CHF or pleural effusion or pneumothorax. Minimal right infrahilar
interstitial density compatible with atelectasis or scarring.

## 2017-03-20 ENCOUNTER — Ambulatory Visit (INDEPENDENT_AMBULATORY_CARE_PROVIDER_SITE_OTHER): Payer: Medicare Other | Admitting: Nurse Practitioner

## 2017-03-20 ENCOUNTER — Encounter: Payer: Self-pay | Admitting: Nurse Practitioner

## 2017-03-20 VITALS — BP 143/72 | HR 72 | Temp 97.6°F | Ht 60.0 in | Wt 220.0 lb

## 2017-03-20 DIAGNOSIS — I77810 Thoracic aortic ectasia: Secondary | ICD-10-CM | POA: Diagnosis not present

## 2017-03-20 DIAGNOSIS — Z13820 Encounter for screening for osteoporosis: Secondary | ICD-10-CM

## 2017-03-20 DIAGNOSIS — E782 Mixed hyperlipidemia: Secondary | ICD-10-CM

## 2017-03-20 DIAGNOSIS — Z952 Presence of prosthetic heart valve: Secondary | ICD-10-CM | POA: Diagnosis not present

## 2017-03-20 DIAGNOSIS — Z23 Encounter for immunization: Secondary | ICD-10-CM | POA: Diagnosis not present

## 2017-03-20 DIAGNOSIS — I10 Essential (primary) hypertension: Secondary | ICD-10-CM | POA: Diagnosis not present

## 2017-03-20 MED ORDER — METOPROLOL TARTRATE 25 MG PO TABS: 12.5000 mg | ORAL_TABLET | Freq: Two times a day (BID) | ORAL | 1 refills | Status: DC

## 2017-03-20 MED ORDER — LISINOPRIL 10 MG PO TABS
10.0000 mg | ORAL_TABLET | Freq: Every day | ORAL | 1 refills | Status: DC
Start: 2017-03-20 — End: 2017-09-03

## 2017-03-20 NOTE — Addendum Note (Signed)
Addended by: Cleda DaubUCKER, Trip Cavanagh G on: 03/20/2017 12:48 PM   Modules accepted: Orders

## 2017-03-20 NOTE — Addendum Note (Signed)
Addended by: Bennie PieriniMARTIN, MARY-MARGARET on: 03/20/2017 12:32 PM   Modules accepted: Orders

## 2017-03-20 NOTE — Patient Instructions (Signed)
DASH Eating Plan DASH stands for "Dietary Approaches to Stop Hypertension." The DASH eating plan is a healthy eating plan that has been shown to reduce high blood pressure (hypertension). It may also reduce your risk for type 2 diabetes, heart disease, and stroke. The DASH eating plan may also help with weight loss. What are tips for following this plan? General guidelines  Avoid eating more than 2,300 mg (milligrams) of salt (sodium) a day. If you have hypertension, you may need to reduce your sodium intake to 1,500 mg a day.  Limit alcohol intake to no more than 1 drink a day for nonpregnant women and 2 drinks a day for men. One drink equals 12 oz of beer, 5 oz of wine, or 1 oz of hard liquor.  Work with your health care provider to maintain a healthy body weight or to lose weight. Ask what an ideal weight is for you.  Get at least 30 minutes of exercise that causes your heart to beat faster (aerobic exercise) most days of the week. Activities may include walking, swimming, or biking.  Work with your health care provider or diet and nutrition specialist (dietitian) to adjust your eating plan to your individual calorie needs. Reading food labels  Check food labels for the amount of sodium per serving. Choose foods with less than 5 percent of the Daily Value of sodium. Generally, foods with less than 300 mg of sodium per serving fit into this eating plan.  To find whole grains, look for the word "whole" as the first word in the ingredient list. Shopping  Buy products labeled as "low-sodium" or "no salt added."  Buy fresh foods. Avoid canned foods and premade or frozen meals. Cooking  Avoid adding salt when cooking. Use salt-free seasonings or herbs instead of table salt or sea salt. Check with your health care provider or pharmacist before using salt substitutes.  Do not fry foods. Cook foods using healthy methods such as baking, boiling, grilling, and broiling instead.  Cook with  heart-healthy oils, such as olive, canola, soybean, or sunflower oil. Meal planning   Eat a balanced diet that includes: ? 5 or more servings of fruits and vegetables each day. At each meal, try to fill half of your plate with fruits and vegetables. ? Up to 6-8 servings of whole grains each day. ? Less than 6 oz of lean meat, poultry, or fish each day. A 3-oz serving of meat is about the same size as a deck of cards. One egg equals 1 oz. ? 2 servings of low-fat dairy each day. ? A serving of nuts, seeds, or beans 5 times each week. ? Heart-healthy fats. Healthy fats called Omega-3 fatty acids are found in foods such as flaxseeds and coldwater fish, like sardines, salmon, and mackerel.  Limit how much you eat of the following: ? Canned or prepackaged foods. ? Food that is high in trans fat, such as fried foods. ? Food that is high in saturated fat, such as fatty meat. ? Sweets, desserts, sugary drinks, and other foods with added sugar. ? Full-fat dairy products.  Do not salt foods before eating.  Try to eat at least 2 vegetarian meals each week.  Eat more home-cooked food and less restaurant, buffet, and fast food.  When eating at a restaurant, ask that your food be prepared with less salt or no salt, if possible. What foods are recommended? The items listed may not be a complete list. Talk with your dietitian about what   dietary choices are best for you. Grains Whole-grain or whole-wheat bread. Whole-grain or whole-wheat pasta. Brown rice. Oatmeal. Quinoa. Bulgur. Whole-grain and low-sodium cereals. Pita bread. Low-fat, low-sodium crackers. Whole-wheat flour tortillas. Vegetables Fresh or frozen vegetables (raw, steamed, roasted, or grilled). Low-sodium or reduced-sodium tomato and vegetable juice. Low-sodium or reduced-sodium tomato sauce and tomato paste. Low-sodium or reduced-sodium canned vegetables. Fruits All fresh, dried, or frozen fruit. Canned fruit in natural juice (without  added sugar). Meat and other protein foods Skinless chicken or turkey. Ground chicken or turkey. Pork with fat trimmed off. Fish and seafood. Egg whites. Dried beans, peas, or lentils. Unsalted nuts, nut butters, and seeds. Unsalted canned beans. Lean cuts of beef with fat trimmed off. Low-sodium, lean deli meat. Dairy Low-fat (1%) or fat-free (skim) milk. Fat-free, low-fat, or reduced-fat cheeses. Nonfat, low-sodium ricotta or cottage cheese. Low-fat or nonfat yogurt. Low-fat, low-sodium cheese. Fats and oils Soft margarine without trans fats. Vegetable oil. Low-fat, reduced-fat, or light mayonnaise and salad dressings (reduced-sodium). Canola, safflower, olive, soybean, and sunflower oils. Avocado. Seasoning and other foods Herbs. Spices. Seasoning mixes without salt. Unsalted popcorn and pretzels. Fat-free sweets. What foods are not recommended? The items listed may not be a complete list. Talk with your dietitian about what dietary choices are best for you. Grains Baked goods made with fat, such as croissants, muffins, or some breads. Dry pasta or rice meal packs. Vegetables Creamed or fried vegetables. Vegetables in a cheese sauce. Regular canned vegetables (not low-sodium or reduced-sodium). Regular canned tomato sauce and paste (not low-sodium or reduced-sodium). Regular tomato and vegetable juice (not low-sodium or reduced-sodium). Pickles. Olives. Fruits Canned fruit in a light or heavy syrup. Fried fruit. Fruit in cream or butter sauce. Meat and other protein foods Fatty cuts of meat. Ribs. Fried meat. Bacon. Sausage. Bologna and other processed lunch meats. Salami. Fatback. Hotdogs. Bratwurst. Salted nuts and seeds. Canned beans with added salt. Canned or smoked fish. Whole eggs or egg yolks. Chicken or turkey with skin. Dairy Whole or 2% milk, cream, and half-and-half. Whole or full-fat cream cheese. Whole-fat or sweetened yogurt. Full-fat cheese. Nondairy creamers. Whipped toppings.  Processed cheese and cheese spreads. Fats and oils Butter. Stick margarine. Lard. Shortening. Ghee. Bacon fat. Tropical oils, such as coconut, palm kernel, or palm oil. Seasoning and other foods Salted popcorn and pretzels. Onion salt, garlic salt, seasoned salt, table salt, and sea salt. Worcestershire sauce. Tartar sauce. Barbecue sauce. Teriyaki sauce. Soy sauce, including reduced-sodium. Steak sauce. Canned and packaged gravies. Fish sauce. Oyster sauce. Cocktail sauce. Horseradish that you find on the shelf. Ketchup. Mustard. Meat flavorings and tenderizers. Bouillon cubes. Hot sauce and Tabasco sauce. Premade or packaged marinades. Premade or packaged taco seasonings. Relishes. Regular salad dressings. Where to find more information:  National Heart, Lung, and Blood Institute: www.nhlbi.nih.gov  American Heart Association: www.heart.org Summary  The DASH eating plan is a healthy eating plan that has been shown to reduce high blood pressure (hypertension). It may also reduce your risk for type 2 diabetes, heart disease, and stroke.  With the DASH eating plan, you should limit salt (sodium) intake to 2,300 mg a day. If you have hypertension, you may need to reduce your sodium intake to 1,500 mg a day.  When on the DASH eating plan, aim to eat more fresh fruits and vegetables, whole grains, lean proteins, low-fat dairy, and heart-healthy fats.  Work with your health care provider or diet and nutrition specialist (dietitian) to adjust your eating plan to your individual   calorie needs. This information is not intended to replace advice given to you by your health care provider. Make sure you discuss any questions you have with your health care provider. Document Released: 02/09/2011 Document Revised: 02/14/2016 Document Reviewed: 02/14/2016 Elsevier Interactive Patient Education  2018 Elsevier Inc.  

## 2017-03-20 NOTE — Progress Notes (Signed)
Subjective:    Patient ID: Angela Dawson, female    DOB: 02/07/1946, 72 y.o.   MRN: [id number]  HPI  Angela Dawson is here today for follow up of chronic medical problem.  Outpatient Encounter Medications as of 03/20/2017  Medication Sig  . acetaminophen (TYLENOL) 325 MG tablet Take 2 tablets (650 mg total) by mouth every 6 (six) hours as needed for mild pain.  Marland Kitchen aspirin EC 81 MG tablet Take 81 mg by mouth daily.  . cetirizine (ZYRTEC) 10 MG tablet TAKE 1 TABLET EVERY DAY  . lisinopril (PRINIVIL,ZESTRIL) 10 MG tablet Take 1 tablet (10 mg total) by mouth daily.  . metoprolol tartrate (LOPRESSOR) 25 MG tablet Take 0.5 tablets (12.5 mg total) by mouth 2 (two) times daily.     1. Essential hypertension  No c/o chest pain, sob or headache. Does not check blood pressure at home. BP Readings from Last 3 Encounters:  03/20/17 (!) 146/81  10/26/16 140/78  10/18/16 (!) 154/76     2. Ascending aorta dilatation (HCC)  Had ct angiogram 09/21/16- no changes- will repeat nec=xt july  3. Mixed hyperlipidemia  Admits to not watching diet  4. Severe obesity (BMI >= 40) (HCC)  No recent weight changes  5. S/P AVR (aortic valve replacement)  Sees cardiology every 6 months    New complaints: None today  Social history: Not married- lives by herself- has 4 dogs    Review of Systems  Constitutional: Negative for activity change and appetite change.  HENT: Negative.   Eyes: Negative for pain.  Respiratory: Negative for shortness of breath.   Cardiovascular: Negative for chest pain, palpitations and leg swelling.  Gastrointestinal: Negative for abdominal pain.  Endocrine: Negative for polydipsia.  Genitourinary: Negative.   Skin: Negative for rash.  Neurological: Negative for dizziness, weakness and headaches.  Hematological: Does not bruise/bleed easily.  Psychiatric/Behavioral: Negative.   All other systems reviewed and are negative.      Objective:   Physical Exam    Constitutional: She is oriented to person, place, and time. She appears well-developed and well-nourished.  HENT:  Nose: Nose normal.  Mouth/Throat: Oropharynx is clear and moist.  Eyes: EOM are normal.  Neck: Trachea normal, normal range of motion and full passive range of motion without pain. Neck supple. No JVD present. Carotid bruit is not present. No thyromegaly present.  Cardiovascular: Normal rate, regular rhythm, normal heart sounds and intact distal pulses. Exam reveals no gallop and no friction rub.  No murmur heard. Pulmonary/Chest: Effort normal and breath sounds normal.  Abdominal: Soft. Bowel sounds are normal. She exhibits no distension and no mass. There is no tenderness.  Musculoskeletal: Normal range of motion.  Lymphadenopathy:    She has no cervical adenopathy.  Neurological: She is alert and oriented to person, place, and time. She has normal reflexes.  Skin: Skin is warm and dry.  Psychiatric: She has a normal mood and affect. Her behavior is normal. Judgment and thought content normal.   BP (!) 143/72   Pulse 72   Temp 97.6 F (36.4 C) (Oral)   Ht 5' (1.524 m)   Wt 220 lb (99.8 kg)   BMI 42.97 kg/m       Assessment & Plan:  1. Essential hypertension Low sodium diet - CMP14+EGFR - lisinopril (PRINIVIL,ZESTRIL) 10 MG tablet; Take 1 tablet (10 mg total) by mouth daily.  Dispense: 90 tablet; Refill: 1  2. Ascending aorta dilatation [facility]) Will recheck in February  3. Mixed hyperlipidemia Low fat diet encouraged - Lipid panel  4. Severe obesity (BMI >= 40) (HCC) Discussed diet and exercise for person with BMI >25 Will recheck weight in 3-6 months  5. S/P AVR (aortic valve replacement) - metoprolol tartrate (LOPRESSOR) 25 MG tablet; Take 0.5 tablets (12.5 mg total) by mouth 2 (two) times daily.  Dispense: 90 tablet; Refill: 1    Labs pending Health maintenance reviewed Diet and exercise encouraged Continue all meds Follow up  In 3 months    Hugo, FNP

## 2017-03-21 LAB — CMP14+EGFR
A/G RATIO: 1.7 (ref 1.2–2.2)
ALK PHOS: 80 IU/L (ref 39–117)
ALT: 18 IU/L (ref 0–32)
AST: 19 IU/L (ref 0–40)
Albumin: 4.2 g/dL (ref 3.5–4.8)
BILIRUBIN TOTAL: 0.3 mg/dL (ref 0.0–1.2)
BUN/Creatinine Ratio: 16 (ref 12–28)
BUN: 12 mg/dL (ref 8–27)
CHLORIDE: 104 mmol/L (ref 96–106)
CO2: 24 mmol/L (ref 20–29)
Calcium: 9.1 mg/dL (ref 8.7–10.3)
Creatinine, Ser: 0.73 mg/dL (ref 0.57–1.00)
GFR calc non Af Amer: 84 mL/min/{1.73_m2} (ref >59)
GFR, EST AFRICAN AMERICAN: 96 mL/min/{1.73_m2} (ref >59)
GLUCOSE: 97 mg/dL (ref 65–99)
Globulin, Total: 2.5 g/dL (ref 1.5–4.5)
POTASSIUM: 4.4 mmol/L (ref 3.5–5.2)
Sodium: 143 mmol/L (ref 134–144)
TOTAL PROTEIN: 6.7 g/dL (ref 6.0–8.5)

## 2017-03-21 LAB — LIPID PANEL
CHOLESTEROL TOTAL: 193 mg/dL (ref 100–199)
Chol/HDL Ratio: 3.9 ratio (ref 0.0–4.4)
HDL: 50 mg/dL (ref >39)
LDL Calculated: 122 mg/dL — ABNORMAL HIGH (ref 0–99)
Triglycerides: 106 mg/dL (ref 0–149)
VLDL CHOLESTEROL CAL: 21 mg/dL (ref 5–40)

## 2017-04-02 ENCOUNTER — Ambulatory Visit (INDEPENDENT_AMBULATORY_CARE_PROVIDER_SITE_OTHER): Payer: Medicare Other

## 2017-04-02 DIAGNOSIS — Z780 Asymptomatic menopausal state: Secondary | ICD-10-CM | POA: Diagnosis not present

## 2017-04-02 DIAGNOSIS — M8589 Other specified disorders of bone density and structure, multiple sites: Secondary | ICD-10-CM | POA: Diagnosis not present

## 2017-04-11 ENCOUNTER — Other Ambulatory Visit: Payer: Self-pay | Admitting: Nurse Practitioner

## 2017-04-17 ENCOUNTER — Ambulatory Visit: Payer: Medicare Other | Admitting: *Deleted

## 2017-04-24 ENCOUNTER — Ambulatory Visit: Payer: Medicare Other | Admitting: Cardiology

## 2017-04-25 ENCOUNTER — Ambulatory Visit: Payer: Medicare Other | Admitting: *Deleted

## 2017-05-15 ENCOUNTER — Ambulatory Visit (INDEPENDENT_AMBULATORY_CARE_PROVIDER_SITE_OTHER): Payer: Medicare Other | Admitting: *Deleted

## 2017-05-15 ENCOUNTER — Encounter: Payer: Self-pay | Admitting: *Deleted

## 2017-05-15 VITALS — BP 139/75 | HR 62 | Ht 59.0 in | Wt 219.0 lb

## 2017-05-15 DIAGNOSIS — Z0000 Encounter for general adult medical examination without abnormal findings: Secondary | ICD-10-CM | POA: Diagnosis not present

## 2017-05-15 NOTE — Progress Notes (Addendum)
Subjective:   Angela Dawson is a 72 y.o. female who presents for a subsequent Medicare Annual Wellness Visit. Angela Dawson lives is widowed and lives at home alone. She enjoys crafting and until her heart surgery in 2017 she was travelling and doing craft shows in [place] and [place]. She really enjoys travelling and takes bus tours as often as possible.   Review of Systems    Reports that her health is better than last year  Cardiac Risk Factors include: advanced age (>44men, >51 women);dyslipidemia;hypertension;sedentary lifestyle;obesity (BMI >30kg/m2)    Other systems negative Objective:    Today's Vitals   05/15/17 1514 05/15/17 1517  BP: 139/75   Pulse: 62   Weight: 219 lb (99.3 kg)   Height: 4\' 11"  (1.499 m)   PainSc:  5    Body mass index is 44.23 kg/m.  Advanced Directives 05/15/2017 04/13/2016 01/20/2016 01/14/2016 10/11/2015 09/24/2015  Does Patient Have a Medical Advance Directive? Yes Yes No No No No  Type of Estate agent of [place];Living will Healthcare Power of Attorney - - - -  Does patient want to make changes to medical advance directive? No - Patient declined - - - - -  Copy of Healthcare Power of Attorney in Chart? Yes Yes - - - -  Would patient like information on creating a medical advance directive? - - - No - patient declined information Yes - Educational materials given No - patient declined information    Current Medications (verified) Outpatient Encounter Medications as of 05/15/2017  Medication Sig  . acetaminophen (TYLENOL) 325 MG tablet Take 2 tablets (650 mg total) by mouth every 6 (six) hours as needed for mild pain.  Marland Kitchen aspirin EC 81 MG tablet Take 81 mg by mouth daily.  . cetirizine (ZYRTEC) 10 MG tablet TAKE 1 TABLET EVERY DAY  . Cholecalciferol (VITAMIN D) 2000 units CAPS Take 2,000 Units by mouth daily.  Marland Kitchen lisinopril (PRINIVIL,ZESTRIL) 10 MG tablet Take 1 tablet (10 mg total) by mouth daily.  . metoprolol tartrate (LOPRESSOR) 25 MG  tablet Take 0.5 tablets (12.5 mg total) by mouth 2 (two) times daily.   No facility-administered encounter medications on file as of 05/15/2017.     Allergies (verified) Crestor [rosuvastatin]; Lipitor [atorvastatin]; Statins; and Penicillins   History: Past Medical History:  Diagnosis Date  . Allergy    seasonal  . Chicken pox   . Complication of anesthesia    difficult to wake up after tubal ligations  . Dyspnea    with exertion  . Heart murmur   . History of bronchitis   . History of kidney stones    "only one"  . Hypertension   . Measles    Past Surgical History:  Procedure Laterality Date  . AORTIC VALVE REPLACEMENT N/A 01/17/2016   Procedure: AORTIC VALVE REPLACEMENT (AVR) USING EDWARDS MAGNA EASE PERICARDIAL BIOPROSTHESIS VALVE;  Surgeon: Delight Ovens, MD;  Location: [facility] OR;  Service: Open Heart Surgery;  Laterality: N/A;  . CARDIAC CATHETERIZATION N/A 09/24/2015   Procedure: Right/Left Heart Cath and Coronary Angiography;  Surgeon: Corky Crafts, MD;  Location: [facility] INVASIVE CV LAB;  Service: Cardiovascular;  Laterality: N/A;  . COLONOSCOPY    . REPLACEMENT ASCENDING AORTA N/A 01/17/2016   Procedure: REPLACEMENT ASCENDING AORTA USING HEMASHIELD PLATINUM WOVEN DOUBLE VELOUR VASCULAR GRAFT;  Surgeon: Delight Ovens, MD;  Location: [facility] OR;  Service: Open Heart Surgery;  Laterality: N/A;  . TEE WITHOUT CARDIOVERSION N/A 01/17/2016  Procedure: TRANSESOPHAGEAL ECHOCARDIOGRAM (TEE);  Surgeon: Delight Ovens, MD;  Location: [facility] OR;  Service: Open Heart Surgery;  Laterality: N/A;  . TUBAL LIGATION     Family History  Problem Relation Age of Onset  . Alzheimer's disease Mother   . Heart disease Mother   . Heart attack Father 86  . Post-traumatic stress disorder Brother    Social History   Socioeconomic History  . Marital status: Widowed    Spouse name: None  . Number of children: 1  . Years of education: business school  . Highest education  level: None  Social Needs  . Financial resource strain: Not hard at all  . Food insecurity - worry: Never true  . Food insecurity - inability: Never true  . Transportation needs - medical: No  . Transportation needs - non-medical: No  Occupational History  . Occupation: retired    Associate Professor: [employer]  . Occupation: Retired    Comment: Biochemist, clinical  Tobacco Use  . Smoking status: Former Smoker    Packs/day: 0.50    Years: 5.00    Pack years: 2.50    Types: Cigarettes    Start date: 08/04/1968    Last attempt to quit: 10/10/1973    Years since quitting: 43.6  . Smokeless tobacco: Never Used  . Tobacco comment: smoked less than 1 pack every 2 weeks. Havent smoked in atleast 40 years  Substance and Sexual Activity  . Alcohol use: No    Alcohol/week: 0.0 oz  . Drug use: No  . Sexual activity: No  Other Topics Concern  . None  Social History Narrative  . None    Tobacco Counseling Counseling given: Not Answered Comment: smoked less than 1 pack every 2 weeks. Havent smoked in atleast 40 years   Clinical Intake:     Pain : 0-10 Pain Score: 5  Pain Location: Hip Pain Orientation: Left Pain Descriptors / Indicators: Aching Pain Onset: More than a month ago Pain Frequency: Intermittent Pain Relieving Factors: Tylenol and rest Effect of Pain on Daily Activities: Pain is irritated by walking  Pain Relieving Factors: Tylenol and rest  Nutritional Status: BMI > 30  Obese Diabetes: No CBG done?: No Did pt. bring in CBG monitor from home?: No  How often do you need to have someone help you when you read instructions, pamphlets, or other written materials from your doctor or pharmacy?: 1 - Never What is the last grade level you completed in school?: [employer]     Information entered by :: Demetrios Loll, RN   Activities of Daily Living In your present state of health, do you have any difficulty performing the following activities: 05/15/2017  Hearing? N   Vision? N  Comment overdue   Difficulty concentrating or making decisions? N  Walking or climbing stairs? N  Dressing or bathing? N  Doing errands, shopping? N  Preparing Food and eating ? N  Using the Toilet? N  In the past six months, have you accidently leaked urine? Y  Comment some mild incontinence with coughing and sneezing, etc  Do you have problems with loss of bowel control? N  Managing your Medications? N  Managing your Finances? N  Housekeeping or managing your Housekeeping? N  Some recent data might be hidden     Immunizations and Health Maintenance Immunization History  Administered Date(s) Administered  . Influenza, High Dose Seasonal PF 12/01/2015, 03/20/2017  . Pneumococcal Conjugate-13 10/11/2015  . Pneumococcal Polysaccharide-23 03/20/2017  Health Maintenance Due  Topic Date Due  . MAMMOGRAM  06/01/2016    Patient Care Team: Bennie PieriniMartin, Mary-Margaret, FNP as PCP - General (Family Medicine) Wyline MoodBranch, Dorothe PeaJonathan F, MD as Consulting Physician (Cardiology) Delight OvensGerhardt, Edward B, MD as Consulting Physician (Cardiothoracic Surgery)  No hospitalizations, ER visits, or surgeries this past year.       Assessment:   This is a routine wellness examination for [employer].  Hearing/Vision screen No deficits noted during visit. Eye exam is overdue  Dietary issues and exercise activities discussed: Current Exercise Habits: Home exercise routine, Type of exercise: walking, Time (Minutes): 30, Frequency (Times/Week): 7, Weekly Exercise (Minutes/Week): 210, Intensity: Mild, Exercise limited by: None identified  Goals    . Exercise 150 min/wk Moderate Activity      Depression Screen PHQ 2/9 Scores 05/15/2017 03/20/2017 10/18/2016 07/28/2016 04/13/2016 02/29/2016 11/30/2015  PHQ - 2 Score 0 0 0 0 0 0 0  PHQ- 9 Score - - - 0 0 - -    Fall Risk Fall Risk  05/15/2017 03/20/2017 10/18/2016 04/13/2016 02/29/2016  Falls in the past year? No No No No No  Number falls in past yr: - - - - -    Injury with Fall? - - - - -  Comment - - - - -    Cognitive Function: MMSE - Mini Mental State Exam 05/15/2017 10/11/2015  Orientation to time 5 5  Orientation to Place 5 5  Registration 3 3  Attention/ Calculation 5 5  Recall 3 3  Language- name 2 objects 2 2  Language- repeat 1 1  Language- follow 3 step command 3 3  Language- read & follow direction 1 1  Write a sentence 1 1  Copy design 1 1  Total score 30 30        Screening Tests Health Maintenance  Topic Date Due  . MAMMOGRAM  06/01/2016  . TETANUS/TDAP  07/17/2017  . COLONOSCOPY  11/10/2021  . DEXA SCAN  04/02/2022  . INFLUENZA VACCINE  Completed  . Hepatitis C Screening  Completed  . PNA vac Low Risk Adult  Completed   [facility]   Plan:  Schedule eye exam Keep f/u with PCP Aim for 150 minutes of moderate activity a week   I have personally reviewed and noted the following in the patient's chart:   . Medical and social history . Use of alcohol, tobacco or illicit drugs  . Current medications and supplements . Functional ability and status . Nutritional status . Physical activity . Advanced directives . List of other physicians . Hospitalizations, surgeries, and ER visits in previous 12 months . Vitals . Screenings to include cognitive, depression, and falls . Referrals and appointments  In addition, I have reviewed and discussed with patient certain preventive protocols, quality metrics, and best practice recommendations. A written personalized care plan for preventive services as well as general preventive health ecommendations were provided to patient.     Demetrios LollKristen Elanore Talcott, RN   05/18/2017   I have reviewed and agree with the above AWV documentation.   Mary-Margaret Daphine DeutscherMartin, FNP

## 2017-05-15 NOTE — Patient Instructions (Signed)
  [name],  Thank you for taking time to come for your Medicare Wellness Visit. I appreciate your ongoing commitment to your health goals. Please review the following plan we discussed and let me know if I can assist you in the future.   These are the goals we discussed: Goals    . Exercise 150 min/wk Moderate Activity       This is a list of the screening recommended for you and due dates:  Health Maintenance  Topic Date Due  . Mammogram  06/01/2016  . Tetanus Vaccine  07/17/2017  . Colon Cancer Screening  11/10/2021  . DEXA scan (bone density measurement)  04/02/2022  . Flu Shot  Completed  .  Hepatitis C: One time screening is recommended by Center for Disease Control  (CDC) for  adults born from [birth year] through 1964.   Completed  . Pneumonia vaccines  Completed

## 2017-06-11 ENCOUNTER — Ambulatory Visit (INDEPENDENT_AMBULATORY_CARE_PROVIDER_SITE_OTHER): Payer: Medicare Other | Admitting: Cardiology

## 2017-06-11 ENCOUNTER — Encounter: Payer: Self-pay | Admitting: Cardiology

## 2017-06-11 ENCOUNTER — Other Ambulatory Visit: Payer: Self-pay

## 2017-06-11 VITALS — BP 105/70 | HR 57 | Ht 59.0 in | Wt 221.0 lb

## 2017-06-11 DIAGNOSIS — Z952 Presence of prosthetic heart valve: Secondary | ICD-10-CM | POA: Diagnosis not present

## 2017-06-11 DIAGNOSIS — E782 Mixed hyperlipidemia: Secondary | ICD-10-CM | POA: Diagnosis not present

## 2017-06-11 DIAGNOSIS — I10 Essential (primary) hypertension: Secondary | ICD-10-CM

## 2017-06-11 NOTE — Patient Instructions (Signed)

## 2017-06-11 NOTE — Progress Notes (Signed)
Clinical Summary Angela Dawson is a 72 y.o.female seen today for follow up of the following medical problems.   1. Aortic stenosis with bicuspid AV and ascending aortic aneurysm.  - 09/2013 echo showed LVEF 65-70%, grade I diastolic dysfunction. Bicuspid AV with moderate to severe AS (mean grad 21, dimensionless index 0.32, valve area 0.93), mild AI. Described mild aorta dilatation from limited views, ascending aorta 4.6 cm. - CTA chest 05/2014 with asscending thoracic aortic aneurysm 4.9 cm.  01/2015 CTA chest ascending aortic aneurysm 4.6 x 4.6 cm. Forllowed by Dr Tyrone Sage.  -06/2014 echo LVEF 65-70%, AVA area 0.91, mean grad 32 - 09/2015 echo LVEF 60-65%, mod to severe AS mean grad 34, AVA 1 -7/2017cath which showed no significant CAD   -she is s/p AVR with Edwards Lifesciene pericardial tissue valve 21mm and supracoronary replacement of the ascending aorta on 01/18/16   - no recent SOB/DOE. No recent edema    2.HTN - compliant with meds  3. Hyperlipidemia - allergic to statins. 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 Planning [place] road trip, then senior citizens trip to [place] with her friend.  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 Upcoming trip to [place] in October.   Past Medical History:  Diagnosis Date  . Allergy    seasonal  . Chicken pox   . Complication of anesthesia    difficult to wake up after tubal ligations  . Dyspnea    with exertion  . Heart murmur   . History of bronchitis   . History of kidney stones    "only one"  . Hypertension   . Measles      Allergies  Allergen Reactions  . Crestor [Rosuvastatin]     Dizziness  . Lipitor [Atorvastatin]     Unable to stand or move  . Statins     Leg cramps   . Penicillins Rash    Has patient had a PCN reaction causing  immediate rash, facial/tongue/throat swelling, SOB or lightheadedness with hypotension: Yes Has patient had a PCN reaction causing severe rash involving mucus membranes or skin necrosis: No Has patient had a PCN reaction that required hospitalization No Has patient had a PCN reaction occurring within the last 10 years: No If all of the above answers are "NO", then may proceed with Cephalosporin use.      Current Outpatient Medications  Medication Sig Dispense Refill  . acetaminophen (TYLENOL) 325 MG tablet Take 2 tablets (650 mg total) by mouth every 6 (six) hours as needed for mild pain.    Marland Kitchen aspirin EC 81 MG tablet Take 81 mg by mouth daily.    . cetirizine (ZYRTEC) 10 MG tablet TAKE 1 TABLET EVERY DAY 90 tablet 1  . Cholecalciferol (VITAMIN D) 2000 units CAPS Take 2,000 Units by mouth daily.    Marland Kitchen lisinopril (PRINIVIL,ZESTRIL) 10 MG tablet Take 1 tablet (10 mg total) by mouth daily. 90 tablet 1  . metoprolol tartrate (LOPRESSOR) 25 MG tablet Take 0.5 tablets (12.5 mg total) by mouth 2 (two) times daily. 90 tablet 1   No current facility-administered medications for this visit.      Past Surgical History:  Procedure Laterality Date  . AORTIC VALVE REPLACEMENT N/A 01/17/2016   Procedure: AORTIC VALVE REPLACEMENT (AVR) USING  EDWARDS MAGNA EASE PERICARDIAL BIOPROSTHESIS VALVE;  Surgeon: Delight OvensEdward B Gerhardt, MD;  [facility];  Service: Open Heart Surgery;  Laterality: N/A;  . CARDIAC CATHETERIZATION N/A 09/24/2015   Procedure: Right/Left Heart Cath and Coronary Angiography;  Surgeon: Corky CraftsJayadeep S Varanasi, MD;  [facility];  Service: Cardiovascular;  Laterality: N/A;  . COLONOSCOPY    . REPLACEMENT ASCENDING AORTA N/A 01/17/2016   Procedure: REPLACEMENT ASCENDING AORTA USING HEMASHIELD PLATINUM 28MM WOVEN DOUBLE VELOUR VASCULAR GRAFT;  Surgeon: Delight OvensEdward B Gerhardt, MD;  Location: [facility] OR;  Service: Open Heart Surgery;  Laterality: N/A;  . TEE WITHOUT CARDIOVERSION N/A  01/17/2016   Procedure: TRANSESOPHAGEAL ECHOCARDIOGRAM (TEE);  Surgeon: Delight OvensEdward B Gerhardt, MD;  [facility];  Service: Open Heart Surgery;  Laterality: N/A;  . TUBAL LIGATION       Allergies  Allergen Reactions  . Crestor [Rosuvastatin]     Dizziness  . Lipitor [Atorvastatin]     Unable to stand or move  . Statins     Leg cramps   . Penicillins Rash    Has patient had a PCN reaction causing immediate rash, facial/tongue/throat swelling, SOB or lightheadedness with hypotension: Yes Has patient had a PCN reaction causing severe rash involving mucus membranes or skin necrosis: No Has patient had a PCN reaction that required hospitalization No Has patient had a PCN reaction occurring within the last 10 years: No If all of the above answers are "NO", then may proceed with Cephalosporin use.       Family History  Problem Relation Age of Onset  . Alzheimer's disease Mother   . Heart disease Mother   . Heart attack Father 564  . Post-traumatic stress disorder Brother      Social History Angela Dawson reports that she quit smoking about 43 years ago. Her smoking use included cigarettes. She started smoking about 48 years ago. She has a 2.50 pack-year smoking history. She has never used smokeless tobacco. Angela Dawson reports that she does not drink alcohol.   Review of Systems CONSTITUTIONAL: No weight loss, fever, chills, weakness or fatigue.  HEENT: Eyes: No visual loss, blurred vision, double vision or yellow sclerae.No hearing loss, sneezing, congestion, runny nose or sore throat.  SKIN: No rash or itching.  CARDIOVASCULAR: per hpi RESPIRATORY: No shortness of breath, cough or sputum.  GASTROINTESTINAL: No anorexia, nausea, vomiting or diarrhea. No abdominal pain or blood.  GENITOURINARY: No burning on urination, no polyuria NEUROLOGICAL: No headache, dizziness, syncope, paralysis, ataxia, numbness or tingling in the extremities. No change in bowel or bladder control.    MUSCULOSKELETAL: No muscle, back pain, joint pain or stiffness.  LYMPHATICS: No enlarged nodes. No history of splenectomy.  PSYCHIATRIC: No history of depression or anxiety.  ENDOCRINOLOGIC: No reports of sweating, cold or heat intolerance. No polyuria or polydipsia.  Marland Kitchen.   Physical Examination Vitals:   06/11/17 1120  BP: 105/70  Pulse: (!) 57  SpO2: 94%   Vitals:   06/11/17 1120  Weight: 221 lb (100.2 kg)  Height: 4\' 11"  (1.499 m)    Gen: resting comfortably, no acute distress HEENT: no scleral icterus, pupils equal round and reactive, no palptable cervical adenopathy,  CV: RRR, 2/6 systolic murmur rusb, no jvd Resp: Clear to auscultation bilaterally GI: abdomen is soft, non-tender, non-distended, normal bowel sounds, no hepatosplenomegaly MSK: extremities are warm, no edema.  Skin: warm, no rash Neuro:  no focal deficits Psych: appropriate affect   Diagnostic Studies 09/2013 Echo Study Conclusions  -  Left ventricle: The cavity size was normal. Wall thickness was increased in a pattern of mild LVH. Systolic function was vigorous. The estimated ejection fraction was in the range of 65% to 70%. Wall motion was normal; there were no regional wall motion abnormalities. Doppler parameters are consistent with abnormal left ventricular relaxation (grade 1 diastolic dysfunction). - Aortic valve: Bicuspid; moderately calcified leaflets. Cusp separation was reduced. There was moderate to severe stenosis. There was mild regurgitation. Mean gradient (S): 21 mm Hg. LVOT/AV VTI ratio 0.32. Peak gradient (S): 40 mm Hg. Valve area (VTI): 0.93 cm^2. Valve area (Vmax): 0.96 cm^2. - Ascending aorta: The ascending aorta was moderately dilated. Approximately 46 mm. - Mitral valve: Calcified annulus. There was trivial regurgitation. - Right atrium: Central venous pressure (est): 3 mm Hg. - Atrial septum: No defect or patent foramen ovale was identified. - Tricuspid valve: There was  trivial regurgitation. - Pulmonary arteries: PA peak pressure: 29 mm Hg (S). - Pericardium, extracardiac: There was no pericardial effusion.  Impressions:  - Mild LVH with LVEF 65-70%, grade 1 diastolic dysfunction. Moderate to severe aortic stenosis as outlined with bicuspid aortic valve and mild aortic regurgitation. There is also at least moderate dilatation of the ascending aorta based on limited views. Given association of aortic aneurysmal disease with bicuspid aortic valve, would suggest further dedicated imaging of the thoracic aorta with CTA or MRA. Normal PASP 29 mmHg.   05/2014 CTA chest IMPRESSION: Maximal diameter of the ascending aorta is 4.9 cm compare with 4.8 cm on the prior study. No evidence of dissection.  There are airspace and ground-glass opacities in the left upper lobe most consistent with an inflammatory process. There is also a 2 mm nodule. If the patient is at high risk for bronchogenic carcinoma, follow-up chest CT at 1 year is recommended. If the patient is at low risk, no follow-up is needed. This recommendation follows the consensus statement: Guidelines for Management of Small Pulmonary Nodules Detected on CT Scans: A Statement from the Fleischner Society as published in Radiology 2005; 237:395-400.   ADDENDUM: The patient therefore has a thoracic aortic aneurysm that has only increased from 4.8 cm to 4.9 cm since August of last year. Ascending thoracic aortic aneurysm. Recommend semi-annual imaging followup by CTA or MRA and referral to cardiothoracic surgery if not already obtained. This recommendation follows 2010 ACCF/AHA/AATS/ACR/ASA/SCA/SCAI/SIR/STS/SVM Guidelines for the Diagnosis and Management of Patients With Thoracic Aortic Disease. Circulation. 2010; 121: R604-V409   06/2014 Echo Study Conclusions  - Left ventricle: The cavity size was normal. Systolic function was vigorous. The estimated ejection fraction was in the  range of 65% to 70%. Wall motion was normal; there were no regional wall motion abnormalities. Features are consistent with a pseudonormal left ventricular filling pattern, with concomitant abnormal relaxation and increased filling pressure (grade 2 diastolic dysfunction). Doppler parameters are consistent with high ventricular filling pressure. Mild to moderate concentric left ventricular hypertrophy. - Aortic valve: Mildly to moderately calcified annulus. Moderately thickened, severely calcified leaflets. Cusp separation was reduced. There was moderate to severe stenosis. There was mild regurgitation. Peak velocity (S): 364 cm/s. Mean gradient: 32 mmHg. Valve area (Vmax): 0.91 cm^2. - Aorta: Ascending aortic diameter: 43 mm (S). Mild ascending aortic dilatation. - Mitral valve: Mildly to moderately calcified annulus. There was mild regurgitation.  Impressions:  - When compared to the report dated 10/02/13, the mean aortic valve gradient has increased by 11 mmHg. Moderate to severe stenosis is seen.  01/2015 CTA chest IMPRESSION: Dilatation of  the thoracic aorta in its ascending component measuring 4.6 x 4.6 cm in greatest dimension. This is likely stable from the prior exam as some motion artifact was seen on the prior study.  09/2015 echo Study Conclusions  - Left ventricle: The cavity size was normal. Wall thickness was  increased in a pattern of moderate LVH. Systolic function was  normal. The estimated ejection fraction was in the range of 60%  to 65%. Wall motion was normal; there were no regional wall  motion abnormalities. Features are consistent with a pseudonormal  left ventricular filling pattern, with concomitant abnormal  relaxation and increased filling pressure (grade 2 diastolic  dysfunction). Doppler parameters are consistent with high  ventricular filling pressure. - Aortic valve: Moderately thickened, severely  calcified leaflets.  There was moderate to severe stenosis. There was mild  regurgitation. Peak velocity (S): 390 cm/s. Mean gradient (S): 34  mm Hg. Valve area (VTI): 1.01 cm^2. Valve area (Vmax): 0.89 cm^2.  Valve area (Vmean): 0.93 cm^2. - Aorta: Ascending aortic diameter: 46 mm (S). - Ascending aorta: The ascending aorta was mild to moderately  dilated. - Mitral valve: Calcified annulus. There was mild regurgitation. - Left atrium: The atrium was moderately dilated. - Right ventricle: Systolic function was mildly to moderately  reduced. - Tricuspid valve: There was mild-moderate regurgitation.  09/2015 Cath  The left ventricular systolic function is normal.  Nonobstructive CAD.  Normal PA pressures. CI 2.3; PA sat 68%.  Dilated ascending aorta.  Continue with plans for AVR and aneurysm repair      Assessment and Plan  1. Bicuspid aortic valve with aortic stenosis and aortic aneurysm - s/p tissue AVR and ascending aorta replacement - doing well without symptoms, continue to monitor  2. HTN - at goal, continue current meds  3. Hyperlipidemia - allergic to statins, zetia was too expensive. Not interested in pcsk9 inhibitor - continue dietary modifcation       Antoine Poche, [name]

## 2017-06-15 ENCOUNTER — Encounter: Payer: Self-pay | Admitting: Cardiology

## 2017-07-05 ENCOUNTER — Encounter: Payer: Self-pay | Admitting: Nurse Practitioner

## 2017-07-05 ENCOUNTER — Ambulatory Visit (INDEPENDENT_AMBULATORY_CARE_PROVIDER_SITE_OTHER): Payer: Medicare Other | Admitting: Nurse Practitioner

## 2017-07-05 VITALS — BP 149/74 | HR 59 | Temp 97.5°F | Ht 59.0 in | Wt 222.0 lb

## 2017-07-05 DIAGNOSIS — M799 Soft tissue disorder, unspecified: Secondary | ICD-10-CM | POA: Diagnosis not present

## 2017-07-05 DIAGNOSIS — M7989 Other specified soft tissue disorders: Secondary | ICD-10-CM

## 2017-07-05 NOTE — Progress Notes (Signed)
   Subjective:    Patient ID: Angela Dawson, female    DOB: [birth year], 72 y.o.   MRN: [id number]   Chief Complaint: Knot on left side of neck   HPI Patient come sin c/o a lump on her neck that she does not know when it appeared. It has not changed any since she noticed it in February. She denies SOB or dysphagia.    Review of Systems  Constitutional: Negative.   HENT: Negative.   Respiratory: Negative.   Cardiovascular: Negative.   Gastrointestinal: Negative.   Genitourinary: Negative.   Musculoskeletal: Negative.   Neurological: Negative.   Psychiatric/Behavioral: Negative.        Objective:   Physical Exam  Constitutional: She is oriented to person, place, and time. She appears well-developed and well-nourished. No distress.  Neck: Normal range of motion. Neck supple.  Cardiovascular: Normal rate.  Pulmonary/Chest: Effort normal.  Neurological: She is alert and oriented to person, place, and time.  Skin: Skin is warm.  3cm mobile soft nontender mass left clacicle.   BP (!) 149/74   Pulse (!) 59   Temp (!) 97.5 F (36.4 C) (Oral)   Ht  (1.499 m)   Wt 222 lb (100.7 kg)   BMI 44.84 kg/m         Assessment & Plan:  Angela Dawson in today with chief complaint of Knot on left side of neck   1. Soft tissue mass Watch until results arein - [place] LT UPPER EXTREM LTD SOFT TISSUE NON VASCULAR; Future  Mary-Margaret Daphine Deutscher, FNP

## 2017-07-13 ENCOUNTER — Other Ambulatory Visit: Payer: Self-pay | Admitting: Nurse Practitioner

## 2017-07-13 ENCOUNTER — Ambulatory Visit (HOSPITAL_COMMUNITY)
Admission: RE | Admit: 2017-07-13 | Discharge: 2017-07-13 | Disposition: A | Discharge status: Home / Self Care | Payer: Medicare Other | Source: Ambulatory Visit | Attending: Nurse Practitioner | Admitting: Nurse Practitioner

## 2017-07-13 DIAGNOSIS — M7989 Other specified soft tissue disorders: Secondary | ICD-10-CM

## 2017-07-13 DIAGNOSIS — M799 Soft tissue disorder, unspecified: Secondary | ICD-10-CM | POA: Diagnosis not present

## 2017-07-13 DIAGNOSIS — R221 Localized swelling, mass and lump, neck: Secondary | ICD-10-CM | POA: Diagnosis not present

## 2017-09-03 ENCOUNTER — Other Ambulatory Visit: Payer: Self-pay | Admitting: Nurse Practitioner

## 2017-09-03 DIAGNOSIS — I10 Essential (primary) hypertension: Secondary | ICD-10-CM

## 2017-09-03 DIAGNOSIS — Z952 Presence of prosthetic heart valve: Secondary | ICD-10-CM

## 2017-11-05 ENCOUNTER — Other Ambulatory Visit: Payer: Self-pay | Admitting: Nurse Practitioner

## 2017-11-05 DIAGNOSIS — Z952 Presence of prosthetic heart valve: Secondary | ICD-10-CM

## 2017-11-05 DIAGNOSIS — I10 Essential (primary) hypertension: Secondary | ICD-10-CM

## 2017-11-07 NOTE — Telephone Encounter (Signed)
OV 11/26/17

## 2017-11-26 ENCOUNTER — Ambulatory Visit (INDEPENDENT_AMBULATORY_CARE_PROVIDER_SITE_OTHER): Payer: Medicare Other

## 2017-11-26 ENCOUNTER — Encounter: Payer: Self-pay | Admitting: Nurse Practitioner

## 2017-11-26 ENCOUNTER — Ambulatory Visit (INDEPENDENT_AMBULATORY_CARE_PROVIDER_SITE_OTHER): Payer: Medicare Other | Admitting: Nurse Practitioner

## 2017-11-26 VITALS — BP 147/76 | HR 58 | Temp 97.8°F | Ht 59.0 in | Wt 217.0 lb

## 2017-11-26 DIAGNOSIS — M25552 Pain in left hip: Secondary | ICD-10-CM

## 2017-11-26 DIAGNOSIS — I10 Essential (primary) hypertension: Secondary | ICD-10-CM

## 2017-11-26 DIAGNOSIS — E782 Mixed hyperlipidemia: Secondary | ICD-10-CM | POA: Diagnosis not present

## 2017-11-26 DIAGNOSIS — Z952 Presence of prosthetic heart valve: Secondary | ICD-10-CM

## 2017-11-26 DIAGNOSIS — I77810 Thoracic aortic ectasia: Secondary | ICD-10-CM | POA: Diagnosis not present

## 2017-11-26 MED ORDER — METOPROLOL TARTRATE 25 MG PO TABS: 12.5000 mg | ORAL_TABLET | Freq: Two times a day (BID) | ORAL | 0 refills | Status: DC

## 2017-11-26 MED ORDER — METHYLPREDNISOLONE ACETATE 40 MG/ML IJ SUSP
40.0000 mg | Freq: Once | INTRAMUSCULAR | Status: AC
  Administered 2017-11-26: 40 mg via INTRAMUSCULAR

## 2017-11-26 MED ORDER — LISINOPRIL 10 MG PO TABS: 10.0000 mg | ORAL_TABLET | Freq: Every day | ORAL | 0 refills | Status: DC

## 2017-11-26 MED ORDER — METHYLPREDNISOLONE ACETATE 80 MG/ML IJ SUSP: 80.0000 mg | Freq: Once | INTRAMUSCULAR | Status: DC

## 2017-11-26 NOTE — Patient Instructions (Signed)

## 2017-11-26 NOTE — Progress Notes (Signed)
Subjective:    Patient ID: Angela Dawson, female    DOB: 23-Jan-1946, 72 y.o.   MRN: [id number]   Chief Complaint: Medical management of chronic issues  HPI:  1. Essential hypertension  No c/o chest pain, sob or headache. Does not check blood pressure at home. BP Readings from Last 3 Encounters:  07/05/17 (!) 149/74  06/11/17 105/70  05/15/17 139/75     2. Ascending aorta dilatation [facility])  patient had aortic valve replacement as well as aortiic arch sleeve in 2018. Last ct was done 09/21/16 which showed no evidence of complications.last saw cardiologist on 06/11/17 and according to note there was no changes made to current plan of care. She is to follow up in 6 months.  3. Mixed hyperlipidemia  Does try to watch diet as much as possible  4. Severe obesity (BMI >= 40) (HCC)   weight is down 5lbs    Outpatient Encounter Medications as of 11/26/2017  Medication Sig  . acetaminophen (TYLENOL) 325 MG tablet Take 2 tablets (650 mg total) by mouth every 6 (six) hours as needed for mild pain.  Marland Kitchen aspirin EC 81 MG tablet Take 81 mg by mouth daily.  . Calcium Carb-Cholecalciferol (CALCIUM 600+D3) 600-800 MG-UNIT TABS Take by mouth.  . cetirizine (ZYRTEC) 10 MG tablet TAKE 1 TABLET EVERY DAY  . Cholecalciferol (VITAMIN D) 2000 units CAPS Take 2,000 Units by mouth daily.  Marland Kitchen lisinopril (PRINIVIL,ZESTRIL) 10 MG tablet Take 1 tablet (10 mg total) by mouth daily. Keep 11/26/17 visit  . metoprolol tartrate (LOPRESSOR) 25 MG tablet Take 0.5 tablets (12.5 mg total) by mouth 2 (two) times daily. Keep 11/26/17 visit      New complaints: Left hip pain, has had for awhile. She has had for awhile. Has not been xrayed. She has had problems with hip for several years.   Social history: Lives by herself with her 4 dogs   Review of Systems  Constitutional: Negative for activity change and appetite change.  HENT: Negative.   Eyes: Negative for pain.  Respiratory: Negative for shortness of breath.     Cardiovascular: Negative for chest pain, palpitations and leg swelling.  Gastrointestinal: Negative for abdominal pain.  Endocrine: Negative for polydipsia.  Genitourinary: Negative.   Musculoskeletal: Positive for arthralgias (left hip).  Skin: Negative for rash.  Neurological: Negative for dizziness, weakness and headaches.  Hematological: Does not bruise/bleed easily.  Psychiatric/Behavioral: Negative.   All other systems reviewed and are negative.      Objective:   Physical Exam  Constitutional: She is oriented to person, place, and time. She appears well-developed and well-nourished. No distress.  HENT:  Head: Normocephalic.  Nose: Nose normal.  Mouth/Throat: Oropharynx is clear and moist.  Eyes: Pupils are equal, round, and reactive to light. EOM are normal.  Neck: Normal range of motion. Neck supple. No JVD present. Carotid bruit is not present.  Cardiovascular: Normal rate, regular rhythm, normal heart sounds and intact distal pulses.  Pulmonary/Chest: Effort normal and breath sounds normal. No respiratory distress. She has no wheezes. She has no rales. She exhibits no tenderness.  Abdominal: Soft. Normal appearance, normal aorta and bowel sounds are normal. She exhibits no distension, no abdominal bruit, no pulsatile midline mass and no mass. There is no splenomegaly or hepatomegaly. There is no tenderness.  Musculoskeletal: Normal range of motion. She exhibits no edema.  From of left hip with pain on external rotation  Lymphadenopathy:    She has no cervical adenopathy.  Neurological:  She is alert and oriented to person, place, and time. She has normal reflexes.  Skin: Skin is warm and dry.  Psychiatric: She has a normal mood and affect. Her behavior is normal. Judgment and thought content normal.  Nursing note and vitals reviewed.  BP (!) 147/76   Pulse (!) 58   Temp 97.8 F (36.6 C) (Oral)   Ht '4\' 11"'$  (1.499 m)   Wt 217 lb (98.4 kg)   BMI 43.83 kg/m   Left hip  xray-joint space narrowing with mild osteoarthriitis-Preliminary reading by Ronnald Collum, FNP  [facility]     Assessment & Plan:  Angela Dawson comes in today with chief complaint of Medical Management of Chronic Issues (Wants to see someone who will put a shot in her hip)   Diagnosis and orders addressed:  1. Essential hypertension Low sodium diet - lisinopril (PRINIVIL,ZESTRIL) 10 MG tablet; Take 1 tablet (10 mg total) by mouth daily. Keep 11/26/17 visit  Dispense: 90 tablet; Refill: 0 - CMP14+EGFR  2. Ascending aorta dilatation (HCC) Keep yearly follow up with cardiology  3. Mixed hyperlipidemia Low fat diet - Lipid panel  4. Severe obesity (BMI >= 40) (HCC) Discussed diet and exercise for person with BMI >25 Will recheck weight in 3-6 months  5. Left hip pain Moist heat to hip If not improving will need to send to ortho - DG HIP UNILAT W OR W/O PELVIS 2-3 VIEWS LEFT; Future - methylPREDNISolone acetate (DEPO-MEDROL) injection 40 mg  6. S/P AVR (aortic valve replacement) - metoprolol tartrate (LOPRESSOR) 25 MG tablet; Take 0.5 tablets (12.5 mg total) by mouth 2 (two) times daily. Keep 11/26/17 visit  Dispense: 90 tablet; Refill: 0   Labs pending Health Maintenance reviewed Diet and exercise encouraged  Follow up plan: 6 months   Mary-Margaret Hassell Done, FNP

## 2017-11-27 LAB — CMP14+EGFR
A/G RATIO: 1.8 (ref 1.2–2.2)
ALBUMIN: 4.3 g/dL (ref 3.5–4.8)
ALT: 20 IU/L (ref 0–32)
AST: 20 IU/L (ref 0–40)
Alkaline Phosphatase: 67 IU/L (ref 39–117)
BILIRUBIN TOTAL: 0.4 mg/dL (ref 0.0–1.2)
BUN / CREAT RATIO: 20 (ref 12–28)
BUN: 18 mg/dL (ref 8–27)
CALCIUM: 9.5 mg/dL (ref 8.7–10.3)
CHLORIDE: 102 mmol/L (ref 96–106)
CO2: 23 mmol/L (ref 20–29)
Creatinine, Ser: 0.9 mg/dL (ref 0.57–1.00)
GFR, EST AFRICAN AMERICAN: 74 mL/min/{1.73_m2} (ref >59)
GFR, EST NON AFRICAN AMERICAN: 65 mL/min/{1.73_m2} (ref >59)
Globulin, Total: 2.4 g/dL (ref 1.5–4.5)
Glucose: 88 mg/dL (ref 65–99)
POTASSIUM: 4.3 mmol/L (ref 3.5–5.2)
Sodium: 142 mmol/L (ref 134–144)
TOTAL PROTEIN: 6.7 g/dL (ref 6.0–8.5)

## 2017-11-27 LAB — LIPID PANEL
Chol/HDL Ratio: 4.7 ratio — ABNORMAL HIGH (ref 0.0–4.4)
Cholesterol, Total: 222 mg/dL — ABNORMAL HIGH (ref 100–199)
HDL: 47 mg/dL (ref >39)
LDL Calculated: 145 mg/dL — ABNORMAL HIGH (ref 0–99)
Triglycerides: 150 mg/dL — ABNORMAL HIGH (ref 0–149)
VLDL CHOLESTEROL CAL: 30 mg/dL (ref 5–40)

## 2018-01-23 ENCOUNTER — Telehealth: Payer: Self-pay | Admitting: Nurse Practitioner

## 2018-01-24 ENCOUNTER — Ambulatory Visit (INDEPENDENT_AMBULATORY_CARE_PROVIDER_SITE_OTHER): Payer: Medicare Other

## 2018-01-24 DIAGNOSIS — Z23 Encounter for immunization: Secondary | ICD-10-CM

## 2018-01-28 ENCOUNTER — Other Ambulatory Visit: Payer: Self-pay | Admitting: Nurse Practitioner

## 2018-03-13 ENCOUNTER — Other Ambulatory Visit: Payer: Self-pay | Admitting: Nurse Practitioner

## 2018-03-13 DIAGNOSIS — Z952 Presence of prosthetic heart valve: Secondary | ICD-10-CM

## 2018-03-13 DIAGNOSIS — I10 Essential (primary) hypertension: Secondary | ICD-10-CM

## 2018-04-01 ENCOUNTER — Other Ambulatory Visit: Payer: Self-pay | Admitting: Nurse Practitioner

## 2018-05-27 ENCOUNTER — Other Ambulatory Visit: Payer: Self-pay | Admitting: Nurse Practitioner

## 2018-05-27 ENCOUNTER — Telehealth (INDEPENDENT_AMBULATORY_CARE_PROVIDER_SITE_OTHER): Payer: PRIVATE HEALTH INSURANCE | Admitting: Nurse Practitioner

## 2018-05-27 ENCOUNTER — Other Ambulatory Visit: Payer: Self-pay

## 2018-05-27 DIAGNOSIS — E782 Mixed hyperlipidemia: Secondary | ICD-10-CM

## 2018-05-27 DIAGNOSIS — I10 Essential (primary) hypertension: Secondary | ICD-10-CM | POA: Diagnosis not present

## 2018-05-27 DIAGNOSIS — Z952 Presence of prosthetic heart valve: Secondary | ICD-10-CM | POA: Diagnosis not present

## 2018-05-27 DIAGNOSIS — I77810 Thoracic aortic ectasia: Secondary | ICD-10-CM

## 2018-05-27 MED ORDER — METOPROLOL TARTRATE 25 MG PO TABS: 12.5000 mg | ORAL_TABLET | Freq: Two times a day (BID) | ORAL | 1 refills | Status: DC

## 2018-05-27 MED ORDER — LISINOPRIL 10 MG PO TABS: 10.0000 mg | ORAL_TABLET | Freq: Every day | ORAL | 1 refills | Status: DC

## 2018-05-27 MED ORDER — CETIRIZINE HCL 10 MG PO TABS: 10.0000 mg | ORAL_TABLET | Freq: Every day | ORAL | 1 refills | Status: DC

## 2018-05-27 NOTE — Progress Notes (Signed)
   Virtual Visit via telephone Note  I connected with Angela Dawson on 05/27/18 at 11:00 by telephone and verified that I am speaking with the correct person using two identifiers. Angela Dawson is currently located at home and no one are currently with her during visit. The provider, Mary-Margaret Daphine Deutscher, FNP is located in their office at time of visit.  I discussed the limitations, risks, security and privacy concerns of performing an evaluation and management service by telephone and the availability of in person appointments. I also discussed with the patient that there may be a patient responsible charge related to this service. The patient expressed understanding and agreed to proceed.   History and Present Illness:   Chief Complaint: medical management of chronic issues  HPI:  1. Essential hypertension  No c/o chest pain, sob or headache. She does check her blood pressure at home. Running 128/80. BP Readings from Last 3 Encounters:  11/26/17 (!) 147/76  07/05/17 (!) 149/74  06/11/17 105/70     2. Ascending aorta dilatation (HCC)  Saw cardiology 06/11/17. No changes were made to plan of care. Follow up is yearly.  3. Mixed hyperlipidemia  Does not watch diet and does very little exercise. Cardiologist does not want her on statins due to side effects and try to watch diet.  4. Severe obesity (BMI >= 40) (HCC)  No recent weight changes.        New complaints: No complaints  Social history: Lives alone other then with dogs. Her son and duaghter in law check on her daily. No medical alert system.        Observations/Objective: Alert and oriented- answers all questions appropriately Patient took blood pressure and was 128/80  Assessment and Plan: Angela Dawson comes in today with chief complaint of No chief complaint on file.   Diagnosis and orders addressed:  1. Essential hypertension Low sdium diet - lisinopril (PRINIVIL,ZESTRIL) 10 MG tablet; Take 1  tablet (10 mg total) by mouth daily.  Dispense: 90 tablet; Refill: 1  2. Ascending aorta dilatation (HCC) Keep yearly follow up with cardiology  3. Mixed hyperlipidemia Low fat diet  4. Severe obesity (BMI >= 40) (HCC) Discussed diet and exercise for person with BMI >25 Will recheck weight in 3-6 months  5. S/P AVR (aortic valve replacement) - metoprolol tartrate (LOPRESSOR) 25 MG tablet; Take 0.5 tablets (12.5 mg total) by mouth 2 (two) times daily.  Dispense: 90 tablet; Refill: 1   Recent labs reviewed Health Maintenance reviewed Diet and exercise encouraged       Follow Up Instructions:  3 months   I discussed the assessment and treatment plan with the patient. The patient was provided an opportunity to ask questions and all were answered. The patient agreed with the plan and demonstrated an understanding of the instructions.   The patient was advised to call back or seek an in-person evaluation if the symptoms worsen or if the condition fails to improve as anticipated.  The above assessment and management plan was discussed with the patient. The patient verbalized understanding of and has agreed to the management plan. Patient is aware to call the clinic if symptoms persist or worsen. Patient is aware when to return to the clinic for a follow-up visit. Patient educated on when it is appropriate to go to the emergency department.    I provided 19 minutes of non-face-to-face time during this encounter.    Mary-Margaret Daphine Deutscher, FNP

## 2018-05-27 NOTE — Patient Instructions (Signed)
DASH Eating Plan  DASH stands for "Dietary Approaches to Stop Hypertension." The DASH eating plan is a healthy eating plan that has been shown to reduce high blood pressure (hypertension). It may also reduce your risk for type 2 diabetes, heart disease, and stroke. The DASH eating plan may also help with weight loss.  What are tips for following this plan?    General guidelines   Avoid eating more than 2,300 mg (milligrams) of salt (sodium) a day. If you have hypertension, you may need to reduce your sodium intake to 1,500 mg a day.   Limit alcohol intake to no more than 1 drink a day for nonpregnant women and 2 drinks a day for men. One drink equals 12 oz of beer, 5 oz of wine, or 1 oz of hard liquor.   Work with your health care provider to maintain a healthy body weight or to lose weight. Ask what an ideal weight is for you.   Get at least 30 minutes of exercise that causes your heart to beat faster (aerobic exercise) most days of the week. Activities may include walking, swimming, or biking.   Work with your health care provider or diet and nutrition specialist (dietitian) to adjust your eating plan to your individual calorie needs.  Reading food labels     Check food labels for the amount of sodium per serving. Choose foods with less than 5 percent of the Daily Value of sodium. Generally, foods with less than 300 mg of sodium per serving fit into this eating plan.   To find whole grains, look for the word "whole" as the first word in the ingredient list.  Shopping   Buy products labeled as "low-sodium" or "no salt added."   Buy fresh foods. Avoid canned foods and premade or frozen meals.  Cooking   Avoid adding salt when cooking. Use salt-free seasonings or herbs instead of table salt or sea salt. Check with your health care provider or pharmacist before using salt substitutes.   Do not fry foods. Cook foods using healthy methods such as baking, boiling, grilling, and broiling instead.   Cook with  heart-healthy oils, such as olive, canola, soybean, or sunflower oil.  Meal planning   Eat a balanced diet that includes:  ? 5 or more servings of fruits and vegetables each day. At each meal, try to fill half of your plate with fruits and vegetables.  ? Up to 6-8 servings of whole grains each day.  ? Less than 6 oz of lean meat, poultry, or fish each day. A 3-oz serving of meat is about the same size as a deck of cards. One egg equals 1 oz.  ? 2 servings of low-fat dairy each day.  ? A serving of nuts, seeds, or beans 5 times each week.  ? Heart-healthy fats. Healthy fats called Omega-3 fatty acids are found in foods such as flaxseeds and coldwater fish, like sardines, salmon, and mackerel.   Limit how much you eat of the following:  ? Canned or prepackaged foods.  ? Food that is high in trans fat, such as fried foods.  ? Food that is high in saturated fat, such as fatty meat.  ? Sweets, desserts, sugary drinks, and other foods with added sugar.  ? Full-fat dairy products.   Do not salt foods before eating.   Try to eat at least 2 vegetarian meals each week.   Eat more home-cooked food and less restaurant, buffet, and fast food.     When eating at a restaurant, ask that your food be prepared with less salt or no salt, if possible.  What foods are recommended?  The items listed may not be a complete list. Talk with your dietitian about what dietary choices are best for you.  Grains  Whole-grain or whole-wheat bread. Whole-grain or whole-wheat pasta. Brown rice. Oatmeal. Quinoa. Bulgur. Whole-grain and low-sodium cereals. Pita bread. Low-fat, low-sodium crackers. Whole-wheat flour tortillas.  Vegetables  Fresh or frozen vegetables (raw, steamed, roasted, or grilled). Low-sodium or reduced-sodium tomato and vegetable juice. Low-sodium or reduced-sodium tomato sauce and tomato paste. Low-sodium or reduced-sodium canned vegetables.  Fruits  All fresh, dried, or frozen fruit. Canned fruit in natural juice (without  added sugar).  Meat and other protein foods  Skinless chicken or turkey. Ground chicken or turkey. Pork with fat trimmed off. Fish and seafood. Egg whites. Dried beans, peas, or lentils. Unsalted nuts, nut butters, and seeds. Unsalted canned beans. Lean cuts of beef with fat trimmed off. Low-sodium, lean deli meat.  Dairy  Low-fat (1%) or fat-free (skim) milk. Fat-free, low-fat, or reduced-fat cheeses. Nonfat, low-sodium ricotta or cottage cheese. Low-fat or nonfat yogurt. Low-fat, low-sodium cheese.  Fats and oils  Soft margarine without trans fats. Vegetable oil. Low-fat, reduced-fat, or light mayonnaise and salad dressings (reduced-sodium). Canola, safflower, olive, soybean, and sunflower oils. Avocado.  Seasoning and other foods  Herbs. Spices. Seasoning mixes without salt. Unsalted popcorn and pretzels. Fat-free sweets.  What foods are not recommended?  The items listed may not be a complete list. Talk with your dietitian about what dietary choices are best for you.  Grains  Baked goods made with fat, such as croissants, muffins, or some breads. Dry pasta or rice meal packs.  Vegetables  Creamed or fried vegetables. Vegetables in a cheese sauce. Regular canned vegetables (not low-sodium or reduced-sodium). Regular canned tomato sauce and paste (not low-sodium or reduced-sodium). Regular tomato and vegetable juice (not low-sodium or reduced-sodium). Pickles. Olives.  Fruits  Canned fruit in a light or heavy syrup. Fried fruit. Fruit in cream or butter sauce.  Meat and other protein foods  Fatty cuts of meat. Ribs. Fried meat. Bacon. Sausage. Bologna and other processed lunch meats. Salami. Fatback. Hotdogs. Bratwurst. Salted nuts and seeds. Canned beans with added salt. Canned or smoked fish. Whole eggs or egg yolks. Chicken or turkey with skin.  Dairy  Whole or 2% milk, cream, and half-and-half. Whole or full-fat cream cheese. Whole-fat or sweetened yogurt. Full-fat cheese. Nondairy creamers. Whipped toppings.  Processed cheese and cheese spreads.  Fats and oils  Butter. Stick margarine. Lard. Shortening. Ghee. Bacon fat. Tropical oils, such as coconut, palm kernel, or palm oil.  Seasoning and other foods  Salted popcorn and pretzels. Onion salt, garlic salt, seasoned salt, table salt, and sea salt. Worcestershire sauce. Tartar sauce. Barbecue sauce. Teriyaki sauce. Soy sauce, including reduced-sodium. Steak sauce. Canned and packaged gravies. Fish sauce. Oyster sauce. Cocktail sauce. Horseradish that you find on the shelf. Ketchup. Mustard. Meat flavorings and tenderizers. Bouillon cubes. Hot sauce and Tabasco sauce. Premade or packaged marinades. Premade or packaged taco seasonings. Relishes. Regular salad dressings.  Where to find more information:   National Heart, Lung, and Blood Institute: www.nhlbi.nih.gov   American Heart Association: www.heart.org  Summary   The DASH eating plan is a healthy eating plan that has been shown to reduce high blood pressure (hypertension). It may also reduce your risk for type 2 diabetes, heart disease, and stroke.   With the   DASH eating plan, you should limit salt (sodium) intake to 2,300 mg a day. If you have hypertension, you may need to reduce your sodium intake to 1,500 mg a day.   When on the DASH eating plan, aim to eat more fresh fruits and vegetables, whole grains, lean proteins, low-fat dairy, and heart-healthy fats.   Work with your health care provider or diet and nutrition specialist (dietitian) to adjust your eating plan to your individual calorie needs.  This information is not intended to replace advice given to you by your health care provider. Make sure you discuss any questions you have with your health care provider.  Document Released: 02/09/2011 Document Revised: 02/14/2016 Document Reviewed: 02/14/2016  Elsevier Interactive Patient Education  2019 [employer]

## 2018-06-03 ENCOUNTER — Other Ambulatory Visit: Payer: Self-pay | Admitting: Nurse Practitioner

## 2018-06-06 ENCOUNTER — Telehealth: Payer: Self-pay | Admitting: *Deleted

## 2018-06-06 NOTE — Telephone Encounter (Signed)
Pt contacted per Dr Branch. History reviewed. No symptoms to suggest any unstable cardiac conditions. Based on discussion, with current pandemic situation, we have postponed 06/11/18 appointment until June 2020. If symptoms change, pt has been instructed to contact our office  

## 2018-06-11 ENCOUNTER — Ambulatory Visit: Payer: Medicare Other | Admitting: Cardiology

## 2018-06-18 ENCOUNTER — Other Ambulatory Visit: Payer: Self-pay | Admitting: Nurse Practitioner

## 2018-06-18 DIAGNOSIS — I10 Essential (primary) hypertension: Secondary | ICD-10-CM

## 2018-08-08 ENCOUNTER — Telehealth: Payer: Self-pay | Admitting: Cardiology

## 2018-08-08 NOTE — Telephone Encounter (Signed)
Virtual Visit Pre-Appointment Phone Call  "(Name), I am calling you today to discuss your upcoming appointment. We are currently trying to limit exposure to the virus that causes COVID-19 by seeing patients at home rather than in the office."  1. "What is the BEST phone number to call the day of the visit?" - include this in appointment notes  2. Do you have or have access to (through a family member/friend) a smartphone with video capability that we can use for your visit?" a. If yes - list this number in appt notes as cell (if different from BEST phone #) and list the appointment type as a VIDEO visit in appointment notes b. If no - list the appointment type as a PHONE visit in appointment notes  3. Confirm consent - "In the setting of the current Covid19 crisis, you are scheduled for a (phone or video) visit with your provider on (date) at (time).  Just as we do with many in-office visits, in order for you to participate in this visit, we must obtain consent.  If you'd like, I can send this to your mychart (if signed up) or email for you to review.  Otherwise, I can obtain your verbal consent now.  All virtual visits are billed to your insurance company just like a normal visit would be.  By agreeing to a virtual visit, we'd like you to understand that the technology does not allow for your provider to perform an examination, and thus may limit your provider's ability to fully assess your condition. If your provider identifies any concerns that need to be evaluated in person, we will make arrangements to do so.  Finally, though the technology is pretty good, we cannot assure that it will always work on either your or our end, and in the setting of a video visit, we may have to convert it to a phone-only visit.  In either situation, we cannot ensure that we have a secure connection.  Are you willing to proceed?" STAFF: Did the patient verbally acknowledge consent to telehealth visit? Document  YES/NO here: yes  4. Advise patient to be prepared - "Two hours prior to your appointment, go ahead and check your blood pressure, pulse, oxygen saturation, and your weight (if you have the equipment to check those) and write them all down. When your visit starts, your provider will ask you for this information. If you have an Apple Watch or Kardia device, please plan to have heart rate information ready on the day of your appointment. Please have a pen and paper handy nearby the day of the visit as well."  5. Give patient instructions for MyChart download to smartphone OR [email or web address] as below if video visit (depending on what platform provider is using)  6. Inform patient they will receive a phone call 15 minutes prior to their appointment time (may be from unknown caller ID) so they should be prepared to answer    TELEPHONE CALL NOTE  Angela Dawson has been deemed a candidate for a follow-up tele-health visit to limit community exposure during the Covid-19 pandemic. I spoke with the patient via phone to ensure availability of phone/video source, confirm preferred email & phone number, and discuss instructions and expectations.  I reminded Angela Dawson to be prepared with any vital sign and/or heart rhythm information that could potentially be obtained via home monitoring, at the time of her visit. I reminded Angela Dawson to expect a phone call prior to  her visit.  Geraldine ContrasStephanie R Smith 08/08/2018 12:27 PM   INSTRUCTIONS FOR DOWNLOADING THE MYCHART APP TO SMARTPHONE  - The patient must first make sure to have activated MyChart and know their login information - If Apple, go to [employer] and type in MyChart in the search bar and download the app. If Android, ask patient to go to [employer] Play Store and type in [place] in the search bar and download the app. The app is free but as with any other app downloads, their phone may require them to verify saved payment information or Apple/Android  password.  - The patient will need to then log into the app with their MyChart username and password, and select [facility] as their healthcare provider to link the account. When it is time for your visit, go to the MyChart app, find appointments, and click Begin Video Visit. Be sure to Select Allow for your device to access the Microphone and Camera for your visit. You will then be connected, and your provider will be with you shortly.  **If they have any issues connecting, or need assistance please contact MyChart service desk [phone])**  **If using a computer, in order to ensure the best quality for their visit they will need to use either of the following Internet Browsers: [employer], or Google Chrome**  IF USING DOXIMITY or DOXY.ME - The patient will receive a link just prior to their visit by text.     FULL LENGTH CONSENT FOR TELE-HEALTH VISIT   I hereby voluntarily request, consent and authorize [facility] and its employed or contracted physicians, physician assistants, nurse practitioners or other licensed health care professionals (the Practitioner), to provide me with telemedicine health care services (the Services") as deemed necessary by the treating Practitioner. I acknowledge and consent to receive the Services by the Practitioner via telemedicine. I understand that the telemedicine visit will involve communicating with the Practitioner through live audiovisual communication technology and the disclosure of certain medical information by electronic transmission. I acknowledge that I have been given the opportunity to request an in-person assessment or other available alternative prior to the telemedicine visit and am voluntarily participating in the telemedicine visit.  I understand that I have the right to withhold or withdraw my consent to the use of telemedicine in the course of my care at any time, without affecting my right to future care or treatment,  and that the Practitioner or I may terminate the telemedicine visit at any time. I understand that I have the right to inspect all information obtained and/or recorded in the course of the telemedicine visit and may receive copies of available information for a reasonable fee.  I understand that some of the potential risks of receiving the Services via telemedicine include:   Delay or interruption in medical evaluation due to technological equipment failure or disruption;  Information transmitted may not be sufficient (e.g. poor resolution of images) to allow for appropriate medical decision making by the Practitioner; and/or   In rare instances, security protocols could fail, causing a breach of personal health information.  Furthermore, I acknowledge that it is my responsibility to provide information about my medical history, conditions and care that is complete and accurate to the best of my ability. I acknowledge that Practitioner's advice, recommendations, and/or decision may be based on factors not within their control, such as incomplete or inaccurate data provided by me or distortions of diagnostic images or specimens that may result from electronic transmissions. I  understand that the practice of medicine is not an exact science and that Practitioner makes no warranties or guarantees regarding treatment outcomes. I acknowledge that I will receive a copy of this consent concurrently upon execution via email to the email address I last provided but may also request a printed copy by calling the office of [place].    I understand that my insurance will be billed for this visit.   I have read or had this consent read to me.  I understand the contents of this consent, which adequately explains the benefits and risks of the Services being provided via telemedicine.   I have been provided ample opportunity to ask questions regarding this consent and the Services and have had my questions  answered to my satisfaction.  I give my informed consent for the services to be provided through the use of telemedicine in my medical care  By participating in this telemedicine visit I agree to the above.

## 2018-08-14 ENCOUNTER — Telehealth (INDEPENDENT_AMBULATORY_CARE_PROVIDER_SITE_OTHER): Payer: Medicare Other | Admitting: Cardiology

## 2018-08-14 ENCOUNTER — Encounter: Payer: Self-pay | Admitting: Cardiology

## 2018-08-14 VITALS — BP 125/49 | Ht 59.0 in | Wt 220.0 lb

## 2018-08-14 DIAGNOSIS — Z952 Presence of prosthetic heart valve: Secondary | ICD-10-CM

## 2018-08-14 DIAGNOSIS — Q231 Congenital insufficiency of aortic valve: Secondary | ICD-10-CM

## 2018-08-14 DIAGNOSIS — I10 Essential (primary) hypertension: Secondary | ICD-10-CM

## 2018-08-14 DIAGNOSIS — Q2381 Bicuspid aortic valve: Secondary | ICD-10-CM

## 2018-08-14 NOTE — Progress Notes (Signed)
Virtual Visit via Telephone Note   This visit type was conducted due to national recommendations for restrictions regarding the COVID-19 Pandemic (e.g. social distancing) in an effort to limit this patient's exposure and mitigate transmission in our community.  Due to her co-morbid illnesses, this patient is at least at moderate risk for complications without adequate follow up.  This format is felt to be most appropriate for this patient at this time.  The patient did not have access to video technology/had technical difficulties with video requiring transitioning to audio format only (telephone).  All issues noted in this document were discussed and addressed.  No physical exam could be performed with this format.  Please refer to the patient's chart for her  consent to telehealth for [facility].   Date:  08/14/2018   ID:  Angela MayoLinda M Dawson, DOB 02/20/1946, MRN [id number]  Patient Location: Home Provider Location: Office  PCP:  Bennie PieriniMartin, Mary-Margaret, FNP  Cardiologist:  Dina RichBranch, Chalonda Schlatter, MD  Electrophysiologist:  None   Evaluation Performed:  Follow-Up Visit  Chief Complaint:  1 year follow up  History of Present Illness:    Angela Dawson is a 73 y.o. female seen today for follow up of the following medical problems.   1. Aortic stenosis with bicuspid AV and ascending aortic aneurysm.  - 09/2013 echo showed LVEF 65-70%, grade I diastolic dysfunction. Bicuspid AV with moderate to severe AS (mean grad 21, dimensionless index 0.32, valve area 0.93), mild AI. Described mild aorta dilatation from limited views, ascending aorta 4.6 cm. - CTA chest 05/2014 with asscending thoracic aortic aneurysm 4.9 cm.  01/2015 CTA chest ascending aortic aneurysm 4.6 x 4.6 cm. Forllowed by Dr Tyrone SageGerhardt.  -06/2014 echo LVEF 65-70%, AVA area 0.91, mean grad 32 - 09/2015 echo LVEF 60-65%, mod to severe AS mean grad 34, AVA 1 -7/2017cath which showed no significant CAD - 05/2016 echo:   -she is s/p AVR  with Edwards Lifesciene pericardial tissue valve 21mm and supracoronary replacement of the ascending aorta on 01/18/16    - no recent chest pain, no SOB or DOE. Can have some generalized fatigue with high levels of exertion which is chronic    2.HTN -she is compliant with meds  3. Hyperlipidemia - allergic to statins. - 10/2016 TC 206 TG 101 HDL 51 LDL 135 - zetia is too expensive  - has been diet controlled      The patient does not have symptoms concerning for COVID-19 infection (fever, chills, cough, or new shortness of breath).    Past Medical History:  Diagnosis Date  . Allergy    seasonal  . Chicken pox   . Complication of anesthesia    difficult to wake up after tubal ligations  . Dyspnea    with exertion  . Heart murmur   . History of bronchitis   . History of kidney stones    "only one"  . Hypertension   . Measles    Past Surgical History:  Procedure Laterality Date  . AORTIC VALVE REPLACEMENT N/A 01/17/2016   Procedure: AORTIC VALVE REPLACEMENT (AVR) USING 21MM EDWARDS MAGNA EASE PERICARDIAL BIOPROSTHESIS VALVE;  Surgeon: Delight OvensEdward B Gerhardt, MD;  Location: [facility];  Service: Open Heart Surgery;  Laterality: N/A;  . CARDIAC CATHETERIZATION N/A 09/24/2015   Procedure: Right/Left Heart Cath and Coronary Angiography;  Surgeon: Corky CraftsJayadeep S Varanasi, MD;  [facility];  Service: Cardiovascular;  Laterality: N/A;  . COLONOSCOPY    . REPLACEMENT ASCENDING AORTA N/A  01/17/2016   Procedure: REPLACEMENT ASCENDING AORTA USING HEMASHIELD PLATINUM 28MM WOVEN DOUBLE VELOUR VASCULAR GRAFT;  Surgeon: Delight OvensEdward B Gerhardt, MD;  Location: [facility] OR;  Service: Open Heart Surgery;  Laterality: N/A;  . TEE WITHOUT CARDIOVERSION N/A 01/17/2016   Procedure: TRANSESOPHAGEAL ECHOCARDIOGRAM (TEE);  Surgeon: Delight OvensEdward B Gerhardt, MD;  [facility];  Service: Open Heart Surgery;  Laterality: N/A;  . TUBAL LIGATION       Current Meds  Medication Sig  . acetaminophen  (TYLENOL) 325 MG tablet Take 2 tablets (650 mg total) by mouth every 6 (six) hours as needed for mild pain.  Marland Kitchen. aspirin EC 81 MG tablet Take 81 mg by mouth daily.  . Calcium Carb-Cholecalciferol (CALCIUM 600+D3) 600-800 MG-UNIT TABS Take by mouth.  . cetirizine (ZYRTEC) 10 MG tablet TAKE 1 TABLET EVERY DAY  . Cholecalciferol (VITAMIN D) 2000 units CAPS Take 2,000 Units by mouth daily.  Marland Kitchen. lisinopril (PRINIVIL,ZESTRIL) 10 MG tablet TAKE 1 TABLET EVERY DAY  . metoprolol tartrate (LOPRESSOR) 25 MG tablet Take 0.5 tablets (12.5 mg total) by mouth 2 (two) times daily.     Allergies:   Crestor [rosuvastatin]; Lipitor [atorvastatin]; Statins; and Penicillins   Social History   Tobacco Use  . Smoking status: Former Smoker    Packs/day: 0.50    Years: 5.00    Pack years: 2.50    Types: Cigarettes    Start date: 08/04/1968    Last attempt to quit: 10/10/1973    Years since quitting: 44.8  . Smokeless tobacco: Never Used  . Tobacco comment: smoked less than 1 pack every 2 weeks. Havent smoked in atleast 40 years  Substance Use Topics  . Alcohol use: No    Alcohol/week: 0.0 standard drinks  . Drug use: No     Family Hx: The patient's family history includes Alzheimer's disease in her mother; Heart attack (age of onset: 7864) in her father; Heart disease in her mother; Post-traumatic stress disorder in her brother.  ROS:   Please see the history of present illness.     All other systems reviewed and are negative.   Prior CV studies:   The following studies were reviewed today:  09/2013 Echo Study Conclusions  - Left ventricle: The cavity size was normal. Wall thickness was increased in a pattern of mild LVH. Systolic function was vigorous. The estimated ejection fraction was in the range of 65% to 70%. Wall motion was normal; there were no regional wall motion abnormalities. Doppler parameters are consistent with abnormal left ventricular relaxation (grade 1 diastolic dysfunction). -  Aortic valve: Bicuspid; moderately calcified leaflets. Cusp separation was reduced. There was moderate to severe stenosis. There was mild regurgitation. Mean gradient (S): 21 mm Hg. LVOT/AV VTI ratio 0.32. Peak gradient (S): 40 mm Hg. Valve area (VTI): 0.93 cm^2. Valve area (Vmax): 0.96 cm^2. - Ascending aorta: The ascending aorta was moderately dilated. Approximately 46 mm. - Mitral valve: Calcified annulus. There was trivial regurgitation. - Right atrium: Central venous pressure (est): 3 mm Hg. - Atrial septum: No defect or patent foramen ovale was identified. - Tricuspid valve: There was trivial regurgitation. - Pulmonary arteries: PA peak pressure: 29 mm Hg (S). - Pericardium, extracardiac: There was no pericardial effusion.  Impressions:  - Mild LVH with LVEF 65-70%, grade 1 diastolic dysfunction. Moderate to severe aortic stenosis as outlined with bicuspid aortic valve and mild aortic regurgitation. There is also at least moderate dilatation of the ascending aorta based on limited views. Given association of aortic  aneurysmal disease with bicuspid aortic valve, would suggest further dedicated imaging of the thoracic aorta with CTA or MRA. Normal PASP 29 mmHg.   05/2014 CTA chest IMPRESSION: Maximal diameter of the ascending aorta is 4.9 cm compare with 4.8 cm on the prior study. No evidence of dissection.  There are airspace and ground-glass opacities in the left upper lobe most consistent with an inflammatory process. There is also a 2 mm nodule. If the patient is at high risk for bronchogenic carcinoma, follow-up chest CT at 1 year is recommended. If the patient is at low risk, no follow-up is needed. This recommendation follows the consensus statement: Guidelines for Management of Small Pulmonary Nodules Detected on CT Scans: A Statement from the Fleischner Society as published in Radiology 2005; 237:395-400.   ADDENDUM: The patient therefore has a thoracic  aortic aneurysm that has only increased from 4.8 cm to 4.9 cm since August of last year. Ascending thoracic aortic aneurysm. Recommend semi-annual imaging followup by CTA or MRA and referral to cardiothoracic surgery if not already obtained. This recommendation follows 2010 ACCF/AHA/AATS/ACR/ASA/SCA/SCAI/SIR/STS/SVM Guidelines for the Diagnosis and Management of Patients With Thoracic Aortic Disease. Circulation. 2010; 121: [id number]   06/2014 Echo Study Conclusions  - Left ventricle: The cavity size was normal. Systolic function was vigorous. The estimated ejection fraction was in the range of 65% to 70%. Wall motion was normal; there were no regional wall motion abnormalities. Features are consistent with a pseudonormal left ventricular filling pattern, with concomitant abnormal relaxation and increased filling pressure (grade 2 diastolic dysfunction). Doppler parameters are consistent with high ventricular filling pressure. Mild to moderate concentric left ventricular hypertrophy. - Aortic valve: Mildly to moderately calcified annulus. Moderately thickened, severely calcified leaflets. Cusp separation was reduced. There was moderate to severe stenosis. There was mild regurgitation. Peak velocity (S): 364 cm/s. Mean gradient: 32 mmHg. Valve area (Vmax): 0.91 cm^2. - Aorta: Ascending aortic diameter: 43 mm (S). Mild ascending aortic dilatation. - Mitral valve: Mildly to moderately calcified annulus. There was mild regurgitation.  Impressions:  - When compared to the report dated 10/02/13, the mean aortic valve gradient has increased by 11 mmHg. Moderate to severe stenosis is seen.  01/2015 CTA chest IMPRESSION: Dilatation of the thoracic aorta in its ascending component measuring 4.6 x 4.6 cm in greatest dimension. This is likely stable from the prior exam as some motion artifact was seen on the prior study.  09/2015 echo Study  Conclusions  - Left ventricle: The cavity size was normal. Wall thickness was  increased in a pattern of moderate LVH. Systolic function was  normal. The estimated ejection fraction was in the range of 60%  to 65%. Wall motion was normal; there were no regional wall  motion abnormalities. Features are consistent with a pseudonormal  left ventricular filling pattern, with concomitant abnormal  relaxation and increased filling pressure (grade 2 diastolic  dysfunction). Doppler parameters are consistent with high  ventricular filling pressure. - Aortic valve: Moderately thickened, severely calcified leaflets.  There was moderate to severe stenosis. There was mild  regurgitation. Peak velocity (S): 390 cm/s. Mean gradient (S): 34  mm Hg. Valve area (VTI): 1.01 cm^2. Valve area (Vmax): 0.89 cm^2.  Valve area (Vmean): 0.93 cm^2. - Aorta: Ascending aortic diameter: 46 mm (S). - Ascending aorta: The ascending aorta was mild to moderately  dilated. - Mitral valve: Calcified annulus. There was mild regurgitation. - Left atrium: The atrium was moderately dilated. - Right ventricle: Systolic function was mildly to moderately  reduced. - Tricuspid valve: There was mild-moderate regurgitation.  09/2015 Cath  The left ventricular systolic function is normal.  Nonobstructive CAD.  Normal PA pressures. CI 2.3; PA sat 68%.  Dilated ascending aorta.  Continue with plans for AVR and aneurysm repair  Labs/Other Tests and Data Reviewed:    EKG:  No ECG reviewed.  Recent Labs: 11/26/2017: ALT 20; BUN 18; Creatinine, Ser 0.90; Potassium 4.3; Sodium 142   Recent Lipid Panel Lab Results  Component Value Date/Time   CHOL 222 (H) 11/26/2017 03:52 PM   CHOL 202 (H) 07/17/2012 04:52 PM   TRIG 150 (H) 11/26/2017 03:52 PM   TRIG 119 07/29/2014 11:39 AM   TRIG 129 07/17/2012 04:52 PM   HDL 47 11/26/2017 03:52 PM   HDL 52 07/29/2014 11:39 AM   HDL 45 07/17/2012 04:52 PM   CHOLHDL  4.7 (H) 11/26/2017 03:52 PM   LDLCALC 145 (H) 11/26/2017 03:52 PM   LDLCALC 156 (H) 09/30/2013 09:09 AM   LDLCALC 131 (H) 07/17/2012 04:52 PM    Wt Readings from Last 3 Encounters:  08/14/18 220 lb (99.8 kg)  11/26/17 217 lb (98.4 kg)  07/05/17 222 lb (100.7 kg)     Objective:    Vital Signs:  BP (!) 125/49   Ht 4\' 11"  (1.499 m)   Wt 220 lb (99.8 kg)   BMI 44.43 kg/m    Normal affect. Normal speech pattern and tone. Comfortable, no apparent distress. No audible signs of SOB or wheezign.   ASSESSMENT & PLAN:    1. Bicuspid aortic valve with aortic stenosis and aortic aneurysm - s/p tissue AVR and ascending aorta replacement -no recent symptoms, continue to monitor  2. HTN - bp at goal, continue current meds       COVID-19 Education: The signs and symptoms of COVID-19 were discussed with the patient and how to seek care for testing (follow up with PCP or arrange E-visit).  The importance of social distancing was discussed today.  Time:   Today, I have spent 14 minutes with the patient with telehealth technology discussing the above problems.     Medication Adjustments/Labs and Tests Ordered: Current medicines are reviewed at length with the patient today.  Concerns regarding medicines are outlined above.   Tests Ordered: No orders of the defined types were placed in this encounter.   Medication Changes: No orders of the defined types were placed in this encounter.   Disposition:  Follow up 1 year  Signed, Carlyle Dolly, MD  08/14/2018 11:51 AM    [place]

## 2018-08-14 NOTE — Patient Instructions (Signed)

## 2018-08-24 ENCOUNTER — Other Ambulatory Visit: Payer: Self-pay | Admitting: Nurse Practitioner

## 2018-08-24 DIAGNOSIS — Z952 Presence of prosthetic heart valve: Secondary | ICD-10-CM

## 2018-09-13 ENCOUNTER — Other Ambulatory Visit: Payer: Self-pay | Admitting: Nurse Practitioner

## 2018-09-13 DIAGNOSIS — Z952 Presence of prosthetic heart valve: Secondary | ICD-10-CM

## 2018-09-16 NOTE — Telephone Encounter (Signed)
MMM NTBS 30 days given 08/26/18

## 2018-10-01 ENCOUNTER — Ambulatory Visit (INDEPENDENT_AMBULATORY_CARE_PROVIDER_SITE_OTHER): Payer: Medicare Other | Admitting: Nurse Practitioner

## 2018-10-01 ENCOUNTER — Other Ambulatory Visit: Payer: Self-pay

## 2018-10-01 ENCOUNTER — Encounter: Payer: Self-pay | Admitting: Nurse Practitioner

## 2018-10-01 VITALS — BP 137/78 | HR 66 | Temp 98.7°F | Ht 59.0 in | Wt 210.0 lb

## 2018-10-01 DIAGNOSIS — E782 Mixed hyperlipidemia: Secondary | ICD-10-CM | POA: Diagnosis not present

## 2018-10-01 DIAGNOSIS — Z952 Presence of prosthetic heart valve: Secondary | ICD-10-CM

## 2018-10-01 DIAGNOSIS — I10 Essential (primary) hypertension: Secondary | ICD-10-CM | POA: Diagnosis not present

## 2018-10-01 DIAGNOSIS — I77810 Thoracic aortic ectasia: Secondary | ICD-10-CM | POA: Diagnosis not present

## 2018-10-01 MED ORDER — METOPROLOL TARTRATE 25 MG PO TABS: ORAL_TABLET | ORAL | 1 refills | Status: DC

## 2018-10-01 MED ORDER — LISINOPRIL 10 MG PO TABS: 10.0000 mg | ORAL_TABLET | Freq: Every day | ORAL | 1 refills | Status: DC

## 2018-10-01 NOTE — Patient Instructions (Signed)
Exercising to Stay Healthy To become healthy and stay healthy, it is recommended that you do moderate-intensity and vigorous-intensity exercise. You can tell that you are exercising at a moderate intensity if your heart starts beating faster and you start breathing faster but can still hold a conversation. You can tell that you are exercising at a vigorous intensity if you are breathing much harder and faster and cannot hold a conversation while exercising. Exercising regularly is important. It has many health benefits, such as:  Improving overall fitness, flexibility, and endurance.  Increasing bone density.  Helping with weight control.  Decreasing body fat.  Increasing muscle strength.  Reducing stress and tension.  Improving overall health. How often should I exercise? Choose an activity that you enjoy, and set realistic goals. Your health care provider can help you make an activity plan that works for you. Exercise regularly as told by your health care provider. This may include:  Doing strength training two times a week, such as: ? Lifting weights. ? Using resistance bands. ? Push-ups. ? Sit-ups. ? Yoga.  Doing a certain intensity of exercise for a given amount of time. Choose from these options: ? A total of 150 minutes of moderate-intensity exercise every week. ? A total of 75 minutes of vigorous-intensity exercise every week. ? A mix of moderate-intensity and vigorous-intensity exercise every week. Children, pregnant women, people who have not exercised regularly, people who are overweight, and older adults may need to talk with a health care provider about what activities are safe to do. If you have a medical condition, be sure to talk with your health care provider before you start a new exercise program. What are some exercise ideas? Moderate-intensity exercise ideas include:  Walking 1 mile (1.6 km) in about 15 minutes.  Biking.  Hiking.  Golfing.  Dancing.   Water aerobics. Vigorous-intensity exercise ideas include:  Walking 4.5 miles (7.2 km) or more in about 1 hour.  Jogging or running 5 miles (8 km) in about 1 hour.  Biking 10 miles (16.1 km) or more in about 1 hour.  Lap swimming.  Roller-skating or in-line skating.  Cross-country skiing.  Vigorous competitive sports, such as football, basketball, and soccer.  Jumping rope.  Aerobic dancing. What are some everyday activities that can help me to get exercise?  Yard work, such as: ? Pushing a lawn mower. ? Raking and bagging leaves.  Washing your car.  Pushing a stroller.  Shoveling snow.  Gardening.  Washing windows or floors. How can I be more active in my day-to-day activities?  Use stairs instead of an elevator.  Take a walk during your lunch break.  If you drive, park your car farther away from your work or school.  If you take public transportation, get off one stop early and walk the rest of the way.  Stand up or walk around during all of your indoor phone calls.  Get up, stretch, and walk around every 30 minutes throughout the day.  Enjoy exercise with a friend. Support to continue exercising will help you keep a regular routine of activity. What guidelines can I follow while exercising?  Before you start a new exercise program, talk with your health care provider.  Do not exercise so much that you hurt yourself, feel dizzy, or get very short of breath.  Wear comfortable clothes and wear shoes with good support.  Drink plenty of water while you exercise to prevent dehydration or heat stroke.  Work out until your breathing   and your heartbeat get faster. Where to find more information  U.S. Department of Health and Human Services: www.hhs.gov  Centers for Disease Control and Prevention (CDC): www.cdc.gov Summary  Exercising regularly is important. It will improve your overall fitness, flexibility, and endurance.  Regular exercise also will  improve your overall health. It can help you control your weight, reduce stress, and improve your bone density.  Do not exercise so much that you hurt yourself, feel dizzy, or get very short of breath.  Before you start a new exercise program, talk with your health care provider. This information is not intended to replace advice given to you by your health care provider. Make sure you discuss any questions you have with your health care provider. Document Released: 03/25/2010 Document Revised: 02/02/2017 Document Reviewed: 01/11/2017 Elsevier Patient Education  2020 Elsevier Inc.  

## 2018-10-01 NOTE — Progress Notes (Signed)
Subjective:    Patient ID: Angela Dawson, female    DOB: [birth year], 73 y.o.   MRN: [id number]     History and Present Illness:   Chief Complaint: Medical Management of Chronic Issues    HPI:  1. Essential hypertension No c/o chest pain, sob or headache. Does not check blood pressure at home. BP Readings from Last 3 Encounters:  08/14/18 (!) 125/49  11/26/17 (!) 147/76  07/05/17 (!) 149/74     2. Mixed hyperlipidemia She does not watch diet and does no exercise. Statins cause pain and she refuses to take.  3. Severe obesity (BMI >= 40) (HCC) Weight down 10lbs  4. Ascending aorta dilatation ([place]) Had televisit with cardiology on 08/14/18. According to note there were no changes made to plan of care. She is to follow up in 1 month. Last CT of chest was done 09/21/16- dr. Servando Snare takes care of this.  5. S/P AVR (aortic valve replacement) Valve replacemnt - they also repaired aortic aneurysm    Outpatient Encounter Medications as of 10/01/2018  Medication Sig  . acetaminophen (TYLENOL) 325 MG tablet Take 2 tablets (650 mg total) by mouth every 6 (six) hours as needed for mild pain.  Marland Kitchen aspirin EC 81 MG tablet Take 81 mg by mouth daily.  . Calcium Carb-Cholecalciferol (CALCIUM 600+D3) 600-800 MG-UNIT TABS Take by mouth.  . cetirizine (ZYRTEC) 10 MG tablet TAKE 1 TABLET EVERY DAY  . Cholecalciferol (VITAMIN D) 2000 units CAPS Take 2,000 Units by mouth daily.  Marland Kitchen lisinopril (PRINIVIL,ZESTRIL) 10 MG tablet TAKE 1 TABLET EVERY DAY  . metoprolol tartrate (LOPRESSOR) 25 MG tablet TAKE 1/2 TABLET TWICE DAILY  ( NEED MD APPOINTMENT FOR REFILLS  )     Past Surgical History:  Procedure Laterality Date  . AORTIC VALVE REPLACEMENT N/A 01/17/2016   Procedure: AORTIC VALVE REPLACEMENT (AVR) USING 21MM EDWARDS MAGNA EASE PERICARDIAL BIOPROSTHESIS VALVE;  Surgeon: Grace Isaac, MD;  Location: [facility];  Service: Open Heart Surgery;  Laterality: N/A;  . CARDIAC CATHETERIZATION N/A  09/24/2015   Procedure: Right/Left Heart Cath and Coronary Angiography;  Surgeon: Jettie Booze, MD;  Location: [facility];  Service: Cardiovascular;  Laterality: N/A;  . COLONOSCOPY    . REPLACEMENT ASCENDING AORTA N/A 01/17/2016   Procedure: REPLACEMENT ASCENDING AORTA USING HEMASHIELD PLATINUM 28MM WOVEN DOUBLE VELOUR VASCULAR GRAFT;  Surgeon: Grace Isaac, MD;  Location: [facility];  Service: Open Heart Surgery;  Laterality: N/A;  . TEE WITHOUT CARDIOVERSION N/A 01/17/2016   Procedure: TRANSESOPHAGEAL ECHOCARDIOGRAM (TEE);  Surgeon: Grace Isaac, MD;  Location: [facility];  Service: Open Heart Surgery;  Laterality: N/A;  . TUBAL LIGATION      Family History  Problem Relation Age of Onset  . Alzheimer's disease Mother   . Heart disease Mother   . Heart attack Father 90  . Post-traumatic stress disorder Brother     New complaints: None today  Social history: Patient still lives alone- still has no medical alert system. Family checks on her daily.  Controlled substance contract: N/A     Review of Systems  Constitutional: Negative for activity change and appetite change.  HENT: Negative.   Eyes: Negative for pain.  Respiratory: Negative for shortness of breath.   Cardiovascular: Negative for chest pain, palpitations and leg swelling.  Gastrointestinal: Negative for abdominal pain.  Endocrine: Negative for polydipsia.  Genitourinary: Negative.   Skin: Negative for rash.  Neurological: Negative for dizziness, weakness and headaches.  Hematological: Does not bruise/bleed easily.  Psychiatric/Behavioral: Negative.   All other systems reviewed and are negative.      Objective:   Physical Exam Vitals signs and nursing note reviewed.  Constitutional:      General: She is not in acute distress.    Appearance: Normal appearance. She is well-developed.  HENT:     Head: Normocephalic.     Nose: Nose normal.  Eyes:     Pupils: Pupils are equal, round, and  reactive to light.  Neck:     Musculoskeletal: Normal range of motion and neck supple.     Vascular: No carotid bruit or JVD.  Cardiovascular:     Rate and Rhythm: Normal rate and regular rhythm.     Heart sounds: Normal heart sounds.  Pulmonary:     Effort: Pulmonary effort is normal. No respiratory distress.     Breath sounds: Normal breath sounds. No wheezing or rales.  Chest:     Chest wall: No tenderness.  Abdominal:     General: Bowel sounds are normal. There is no distension or abdominal bruit.     Palpations: Abdomen is soft. There is no hepatomegaly, splenomegaly, mass or pulsatile mass.     Tenderness: There is no abdominal tenderness.  Musculoskeletal: Normal range of motion.  Lymphadenopathy:     Cervical: No cervical adenopathy.  Skin:    General: Skin is warm and dry.  Neurological:     Mental Status: She is alert and oriented to person, place, and time.     Deep Tendon Reflexes: Reflexes are normal and symmetric.  Psychiatric:        Behavior: Behavior normal.        Thought Content: Thought content normal.        Judgment: Judgment normal.    BP 137/78   Pulse 66   Temp 98.7 F (37.1 C) (Oral)   Ht 4' 11" (1.499 m)   Wt 210 lb (95.3 kg)   BMI 42.41 kg/m         Assessment & Plan:  Angela Dawson comes in today with chief complaint of Medical Management of Chronic Issues   Diagnosis and orders addressed:  1. Essential hypertension Low sodium diet - CMP14+EGFR - lisinopril (ZESTRIL) 10 MG tablet; Take 1 tablet (10 mg total) by mouth daily.  Dispense: 90 tablet; Refill: 1  2. Mixed hyperlipidemia Low fat diet - Lipid panel  3. Severe obesity (BMI >= 40) (HCC) Discussed diet and exercise for person with BMI >25 Will recheck weight in 3-6 months  4. Ascending aorta dilatation (HCC)  5. S/P AVR (aortic valve replacement) Keep follow up with cardiology - metoprolol tartrate (LOPRESSOR) 25 MG tablet; TAKE 1/2 TABLET TWICE DAILY  ( NEED MD  APPOINTMENT FOR REFILLS  )  Dispense: 90 tablet; Refill: 1   Labs pending Health Maintenance reviewed Diet and exercise encouraged  Follow up plan: 3 months   Mary-Margaret Hassell Done, FNP

## 2018-10-02 LAB — LIPID PANEL
Chol/HDL Ratio: 5.6 ratio — ABNORMAL HIGH (ref 0.0–4.4)
Cholesterol, Total: 206 mg/dL — ABNORMAL HIGH (ref 100–199)
HDL: 37 mg/dL — ABNORMAL LOW (ref >39)
LDL Calculated: 135 mg/dL — ABNORMAL HIGH (ref 0–99)
Triglycerides: 170 mg/dL — ABNORMAL HIGH (ref 0–149)
VLDL Cholesterol Cal: 34 mg/dL (ref 5–40)

## 2018-10-02 LAB — CMP14+EGFR
ALT: 33 IU/L — ABNORMAL HIGH (ref 0–32)
AST: 33 IU/L (ref 0–40)
Albumin/Globulin Ratio: 1.6 (ref 1.2–2.2)
Albumin: 4.2 g/dL (ref 3.7–4.7)
Alkaline Phosphatase: 63 IU/L (ref 39–117)
BUN/Creatinine Ratio: 18 (ref 12–28)
BUN: 16 mg/dL (ref 8–27)
Bilirubin Total: 0.3 mg/dL (ref 0.0–1.2)
CO2: 23 mmol/L (ref 20–29)
Calcium: 9.5 mg/dL (ref 8.7–10.3)
Chloride: 100 mmol/L (ref 96–106)
Creatinine, Ser: 0.87 mg/dL (ref 0.57–1.00)
GFR calc Af Amer: 77 mL/min/{1.73_m2} (ref >59)
GFR calc non Af Amer: 67 mL/min/{1.73_m2} (ref >59)
Globulin, Total: 2.7 g/dL (ref 1.5–4.5)
Glucose: 98 mg/dL (ref 65–99)
Potassium: 4.5 mmol/L (ref 3.5–5.2)
Sodium: 140 mmol/L (ref 134–144)
Total Protein: 6.9 g/dL (ref 6.0–8.5)

## 2018-10-16 ENCOUNTER — Ambulatory Visit (INDEPENDENT_AMBULATORY_CARE_PROVIDER_SITE_OTHER): Payer: Medicare Other | Admitting: *Deleted

## 2018-10-16 VITALS — Ht 59.0 in | Wt 210.1 lb

## 2018-10-16 DIAGNOSIS — Z0000 Encounter for general adult medical examination without abnormal findings: Secondary | ICD-10-CM | POA: Diagnosis not present

## 2018-10-16 NOTE — Progress Notes (Addendum)
MEDICARE ANNUAL WELLNESS VISIT  10/16/2018  Telephone Visit Disclaimer This Medicare AWV was conducted by telephone due to national recommendations for restrictions regarding the COVID-19 Pandemic (e.g. social distancing).  I verified, using two identifiers, that I am speaking with Angela Dawson or their authorized healthcare agent. I discussed the limitations, risks, security, and privacy concerns of performing an evaluation and management service by telephone and the potential availability of an in-person appointment in the future. The patient expressed understanding and agreed to proceed.   Subjective:  Angela Dawson is a 73 y.o. female patient of Angela Dawson, Mary-Margaret, FNP who had a Medicare Annual Wellness Visit today via telephone. Angela Dawson is Retired and lives alone. she has 1 child. she reports that she is socially active and does interact with friends/family regularly. she is not physically active and enjoys sewing.  Patient Care Team: Angela Dawson, Mary-Margaret, FNP as PCP - General (Family Medicine) Wyline MoodBranch, Dorothe PeaJonathan F, MD as PCP - Cardiology (Cardiology) Delight OvensGerhardt, Edward B, MD as Consulting Physician (Cardiothoracic Surgery)  Advanced Directives 10/16/2018 05/15/2017 04/13/2016 01/20/2016 01/14/2016 10/11/2015 09/24/2015  Does Patient Have a Medical Advance Directive? No Yes Yes No No No No  Type of Advance Directive - Healthcare Power of [place];Living will Healthcare Power of Attorney - - - -  Does patient want to make changes to medical advance directive? - No - Patient declined - - - - -  Copy of Healthcare Power of Attorney in Chart? - Yes Yes - - - -  Would patient like information on creating a medical advance directive? Yes (MAU/Ambulatory/Procedural Areas - Information given) - - - No - patient declined information Yes - Educational materials given No - patient declined information    Hospital Utilization Over the Past 12 Months: # of hospitalizations or ER visits: 0 # of  surgeries: 0  Review of Systems    Patient reports that her overall health is unchanged compared to last year.  Patient Reported Readings (BP, Pulse, CBG, Weight, etc) none  Review of Systems: History obtained from chart review and the patient General ROS: negative  All other systems negative.  Pain Assessment Pain : No/denies pain     Current Medications & Allergies (verified) Allergies as of 10/16/2018      Reactions   Crestor [rosuvastatin]    Dizziness   Lipitor [atorvastatin]    Unable to stand or move   Statins    Leg cramps    Penicillins Rash   Has patient had a PCN reaction causing immediate rash, facial/tongue/throat swelling, SOB or lightheadedness with hypotension: Yes Has patient had a PCN reaction causing severe rash involving mucus membranes or skin necrosis: No Has patient had a PCN reaction that required hospitalization No Has patient had a PCN reaction occurring within the last 10 years: No If all of the above answers are "NO", then may proceed with Cephalosporin use.      Medication List       Accurate as of October 16, 2018  3:46 PM. If you have any questions, ask your nurse or doctor.        acetaminophen 325 MG tablet Commonly known as: TYLENOL Take 2 tablets (650 mg total) by mouth every 6 (six) hours as needed for mild pain.   aspirin EC 81 MG tablet Take 81 mg by mouth daily.   Calcium 600+D3 600-800 MG-UNIT Tabs Generic drug: Calcium Carb-Cholecalciferol Take by mouth.   cetirizine 10 MG tablet Commonly known as: ZYRTEC TAKE 1 TABLET  EVERY DAY   lisinopril 10 MG tablet Commonly known as: ZESTRIL Take 1 tablet (10 mg total) by mouth daily.   metoprolol tartrate 25 MG tablet Commonly known as: LOPRESSOR TAKE 1/2 TABLET TWICE DAILY  ( NEED MD APPOINTMENT FOR REFILLS  )   Vitamin D 50 MCG (2000 UT) Caps Take 2,000 Units by mouth daily.       History (reviewed): Past Medical History:  Diagnosis Date  . Allergy    seasonal   . Chicken pox   . Complication of anesthesia    difficult to wake up after tubal ligations  . Dyspnea    with exertion  . Heart murmur   . History of bronchitis   . History of kidney stones    "only one"  . Hypertension   . Measles    Past Surgical History:  Procedure Laterality Date  . AORTIC VALVE REPLACEMENT N/A 01/17/2016   Procedure: AORTIC VALVE REPLACEMENT (AVR) USING 21MM EDWARDS MAGNA EASE PERICARDIAL BIOPROSTHESIS VALVE;  Surgeon: Grace Isaac, MD;  Location: [facility];  Service: Open Heart Surgery;  Laterality: N/A;  . CARDIAC CATHETERIZATION N/A 09/24/2015   Procedure: Right/Left Heart Cath and Coronary Angiography;  Surgeon: Jettie Booze, MD;  Location: [facility];  Service: Cardiovascular;  Laterality: N/A;  . COLONOSCOPY    . REPLACEMENT ASCENDING AORTA N/A 01/17/2016   Procedure: REPLACEMENT ASCENDING AORTA USING HEMASHIELD PLATINUM 28MM WOVEN DOUBLE VELOUR VASCULAR GRAFT;  Surgeon: Grace Isaac, MD;  Location: [facility];  Service: Open Heart Surgery;  Laterality: N/A;  . TEE WITHOUT CARDIOVERSION N/A 01/17/2016   Procedure: TRANSESOPHAGEAL ECHOCARDIOGRAM (TEE);  Surgeon: Grace Isaac, MD;  Location: [facility];  Service: Open Heart Surgery;  Laterality: N/A;  . TUBAL LIGATION     Family History  Problem Relation Age of Onset  . Alzheimer's disease Mother   . Heart disease Mother   . Heart attack Father 22  . Post-traumatic stress disorder Brother    Social History   Socioeconomic History  . Marital status: Widowed    Spouse name: Not on file  . Number of children: 1  . Years of education: business school  . Highest education level: Bachelor's degree (e.g., BA, AB, BS)  Occupational History  . Occupation: retired    Fish farm manager: [employer]  . Occupation: Retired    Comment: Engineering geologist  Social Needs  . Financial resource strain: Not hard at all  . Food insecurity    Worry: Never true    Inability: Never true  . Transportation  needs    Medical: No    Non-medical: No  Tobacco Use  . Smoking status: Former Smoker    Packs/day: 0.50    Years: 5.00    Pack years: 2.50    Types: Cigarettes    Start date: 08/04/1968    Quit date: 10/10/1973    Years since quitting: 45.0  . Smokeless tobacco: Never Used  . Tobacco comment: smoked less than 1 pack every 2 weeks. Havent smoked in atleast 40 years  Substance and Sexual Activity  . Alcohol use: No    Alcohol/week: 0.0 standard drinks  . Drug use: No  . Sexual activity: Never  Lifestyle  . Physical activity    Days per week: 7 days    Minutes per session: 30 min  . Stress: Not at all  Relationships  . Social connections    Talks on phone: More than three times a week  Gets together: More than three times a week    Attends religious service: Never    Active member of club or organization: No    Attends meetings of clubs or organizations: Never    Relationship status: Widowed  Other Topics Concern  . Not on file  Social History Narrative  . Not on file    Activities of Daily Living In your present state of health, do you have any difficulty performing the following activities: 10/16/2018  Hearing? N  Vision? N  Difficulty concentrating or making decisions? N  Walking or climbing stairs? Y  Dressing or bathing? N  Doing errands, shopping? N  Preparing Food and eating ? N  Using the Toilet? N  In the past six months, have you accidently leaked urine? N  Do you have problems with loss of bowel control? N  Managing your Medications? N  Managing your Finances? N  Housekeeping or managing your Housekeeping? N  Some recent data might be hidden    Patient Education/ Literacy How often do you need to have someone help you when you read instructions, pamphlets, or other written materials from your doctor or pharmacy?: 1 - Never  Exercise Current Exercise Habits: The patient does not participate in regular exercise at present  Diet Patient reports  consuming 3 meals a day and 2 snack(s) a day Patient reports that her primary diet is: Regular Patient reports that she does have regular access to food.   Depression Screen PHQ 2/9 Scores 10/16/2018 10/01/2018 11/26/2017 07/05/2017 05/15/2017 03/20/2017 10/18/2016  PHQ - 2 Score 0 0 0 0 0 0 0  PHQ- 9 Score - - - - - - -     Fall Risk Fall Risk  10/16/2018 10/01/2018 11/26/2017 07/05/2017 05/15/2017  Falls in the past year? 0 0 No No No  Number falls in past yr: 0 - - - -  Injury with Fall? 0 - - - -  Comment - - - - -     Objective:  Angela Dawson seemed alert and oriented and she participated appropriately during our telephone visit.  Blood Pressure Weight BMI  BP Readings from Last 3 Encounters:  10/01/18 137/78  08/14/18 (!) 125/49  11/26/17 (!) 147/76   Wt Readings from Last 3 Encounters:  10/16/18 210 lb 1.6 oz (95.3 kg)  10/01/18 210 lb (95.3 kg)  08/14/18 220 lb (99.8 kg)   BMI Readings from Last 1 Encounters:  10/16/18 42.43 kg/m    *Unable to obtain current vital signs, weight, and BMI due to telephone visit type  Hearing/Vision  . Angela Dawson did  seem to have difficulty with hearing/understanding during the telephone conversation . Reports that she has had a formal eye exam by an eye care professional within the past year . Reports that she has not had a formal hearing evaluation within the past year *Unable to fully assess hearing and vision during telephone visit type  Cognitive Function: 6CIT Screen 10/16/2018  What Year? 0 points  What month? 0 points  What time? 0 points  Count back from 20 0 points  Months in reverse 0 points  Repeat phrase 0 points  Total Score 0   (Normal:0-7, Significant for Dysfunction: >8)  Normal Cognitive Function Screening: Yes   Immunization & Health Maintenance Record Immunization History  Administered Date(s) Administered  . Influenza, High Dose Seasonal PF 12/01/2015, 03/20/2017, 01/24/2018  . Pneumococcal Conjugate-13 10/11/2015   . Pneumococcal Polysaccharide-23 03/20/2017    Health Maintenance  Topic  Date Due  . INFLUENZA VACCINE  10/05/2018  . TETANUS/TDAP  11/27/2018 (Originally 07/17/2017)  . MAMMOGRAM  03/13/2019  . COLONOSCOPY  11/10/2021  . DEXA SCAN  04/02/2022  . Hepatitis C Screening  Completed  . PNA vac Low Risk Adult  Completed       Assessment  This is a routine wellness examination for Angela Dawson.  Health Maintenance: Due or Overdue Health Maintenance Due  Topic Date Due  . INFLUENZA VACCINE  10/05/2018    Angela Dawson does not need a referral for Community Assistance: Care Management:   no Social Work:    no Prescription Assistance:  no Nutrition/Diabetes Education:  no   Plan:  Personalized Goals Goals Addressed   None    Personalized Health Maintenance & Screening Recommendations  Influenza vaccine Td vaccine  Lung Cancer Screening Recommended: no (Low Dose CT Chest recommended if Age 75-80 years, 30 pack-year currently smoking OR have quit w/in past 15 years) Hepatitis C Screening recommended: no HIV Screening recommended: no  Advanced Directives: Written information was prepared per patient's request.  Referrals & Orders No orders of the defined types were placed in this encounter.   Follow-up Plan . Follow-up with Angela Dawson, Mary-Margaret, FNP as planned    I have personally reviewed and noted the following in the patient's chart:   . Medical and social history . Use of alcohol, tobacco or illicit drugs  . Current medications and supplements . Functional ability and status . Nutritional status . Physical activity . Advanced directives . List of other physicians . Hospitalizations, surgeries, and ER visits in previous 12 months . Vitals . Screenings to include cognitive, depression, and falls . Referrals and appointments  In addition, I have reviewed and discussed with Angela Dawson certain preventive protocols, quality metrics, and best practice  recommendations. A written personalized care plan for preventive services as well as general preventive health recommendations is available and can be mailed to the patient at her request.      Caryl BisChanda M Kavon Valenza, LPN  [id number]/95628/02/2019   I have reviewed and agree with the above AWV documentation.   Angela Dawson Daphine DeutscherMartin, FNP

## 2018-10-16 NOTE — Patient Instructions (Signed)
[name] , Thank you for taking time to come for your Medicare Wellness Visit. I appreciate your ongoing commitment to your health goals. Please review the following plan we discussed and let me know if I can assist you in the future.   These are the goals we discussed: Goals    . Exercise 150 min/wk Moderate Activity       This is a list of the screening recommended for you and due dates:  Health Maintenance  Topic Date Due  . Flu Shot  10/05/2018  . Tetanus Vaccine  11/27/2018*  . Mammogram  03/13/2019  . Colon Cancer Screening  11/10/2021  . DEXA scan (bone density measurement)  04/02/2022  .  Hepatitis C: One time screening is recommended by Center for Disease Control  (CDC) for  adults born from [birth year] through 1964.   Completed  . Pneumonia vaccines  Completed  *Topic was postponed. The date shown is not the original due date.    Advance Directive  Advance directives are legal documents that let you make choices ahead of time about your health care and medical treatment in case you become unable to communicate for yourself. Advance directives are a way for you to communicate your wishes to family, friends, and health care providers. This can help convey your decisions about end-of-life care if you become unable to communicate. Discussing and writing advance directives should happen over time rather than all at once. Advance directives can be changed depending on your situation and what you want, even after you have signed the advance directives. If you do not have an advance directive, some states assign family decision makers to act on your behalf based on how closely you are related to them. Each state has its own laws regarding advance directives. You may want to check with your health care provider, attorney, or state representative about the laws in your state. There are different types of advance directives, such as:  Medical power of attorney.  Living will.  Do not  resuscitate (DNR) or do not attempt resuscitation (DNAR) order. Health care proxy and medical power of attorney A health care proxy, also called a health care agent, is a person who is appointed to make medical decisions for you in cases in which you are unable to make the decisions yourself. Generally, people choose someone they know well and trust to represent their preferences. Make sure to ask this person for an agreement to act as your proxy. A proxy may have to exercise judgment in the event of a medical decision for which your wishes are not known. A medical power of attorney is a legal document that names your health care proxy. Depending on the laws in your state, after the document is written, it may also need to be:  Signed.  Notarized.  Dated.  Copied.  Witnessed.  Incorporated into your medical record. You may also want to appoint someone to manage your financial affairs in a situation in which you are unable to do so. This is called a durable power of attorney for finances. It is a separate legal document from the durable power of attorney for health care. You may choose the same person or someone different from your health care proxy to act as your agent in financial matters. If you do not appoint a proxy, or if there is a concern that the proxy is not acting in your best interests, a court-appointed guardian may be designated to act on your behalf.  Living will A living will is a set of instructions documenting your wishes about medical care when you cannot express them yourself. Health care providers should keep a copy of your living will in your medical record. You may want to give a copy to family members or friends. To alert caregivers in case of an emergency, you can place a card in your wallet to let them know that you have a living will and where they can find it. A living will is used if you become:  Terminally ill.  Incapacitated.  Unable to communicate or make  decisions. Items to consider in your living will include:  The use or non-use of life-sustaining equipment, such as dialysis machines and breathing machines (ventilators).  A DNR or DNAR order, which is the instruction not to use cardiopulmonary resuscitation (CPR) if breathing or heartbeat stops.  The use or non-use of tube feeding.  Withholding of food and fluids.  Comfort (palliative) care when the goal becomes comfort rather than a cure.  Organ and tissue donation. A living will does not give instructions for distributing your money and property if you should pass away. It is recommended that you seek the advice of a lawyer when writing a will. Decisions about taxes, beneficiaries, and asset distribution will be legally binding. This process can relieve your family and friends of any concerns surrounding disputes or questions that may come up about the distribution of your assets. DNR or DNAR A DNR or DNAR order is a request not to have CPR in the event that your heart stops beating or you stop breathing. If a DNR or DNAR order has not been made and shared, a health care provider will try to help any patient whose heart has stopped or who has stopped breathing. If you plan to have surgery, talk with your health care provider about how your DNR or DNAR order will be followed if problems occur. Summary  Advance directives are the legal documents that allow you to make choices ahead of time about your health care and medical treatment in case you become unable to communicate for yourself.  The process of discussing and writing advance directives should happen over time. You can change the advance directives, even after you have signed them.  Advance directives include DNR or DNAR orders, living wills, and designating an agent as your medical power of attorney. This information is not intended to replace advice given to you by your health care provider. Make sure you discuss any questions you  have with your health care provider. Document Released: 05/30/2007 Document Revised: 03/27/2018 Document Reviewed: 01/10/2016 Elsevier Patient Education  [place].    BMI for Adults  Body mass index (BMI) is a number that is calculated from a person's weight and height. BMI may help to estimate how much of a person's weight is composed of fat. BMI can help identify those who may be at higher risk for certain medical problems. How is BMI used with adults? BMI is used as a screening tool to identify possible weight problems. It is used to check whether a person is obese, overweight, healthy weight, or underweight. How is BMI calculated? BMI measures your weight and compares it to your height. This can be done either in [place] ([place] or metric measurements. Note that charts are available to help you find your BMI quickly and easily without having to do these calculations yourself. To calculate your BMI in [place] ([place] measurements, your health care provider will:  1. Measure your weight in pounds (lb). 2. Multiply the number of pounds by 703. ? For example, for a person who weighs 180 lb, multiply that number by 703, which equals 126,540. 3. Measure your height in inches (in). Then multiply that number by itself to get a measurement called "inches squared." ? For example, for a person who is 70 in tall, the "inches squared" measurement is 70 in x 70 in, which equals 4900 inches squared. 4. Divide the total from Step 2 (number of lb x 703) by the total from Step 3 (inches squared): 126,540  4900 = 25.8. This is your BMI. To calculate your BMI in metric measurements, your health care provider will: 1. Measure your weight in kilograms (kg). 2. Measure your height in meters (m). Then multiply that number by itself to get a measurement called "meters squared." ? For example, for a person who is 1.75 m tall, the "meters squared" measurement is 1.75 m x 1.75 m, which is equal to 3.1 meters  squared. 3. Divide the number of kilograms (your weight) by the meters squared number. In this example: 70  3.1 = 22.6. This is your BMI. How is BMI interpreted? To interpret your results, your health care provider will use BMI charts to identify whether you are underweight, normal weight, overweight, or obese. The following guidelines will be used:  Underweight: BMI less than 18.5.  Normal weight: BMI between 18.5 and 24.9.  Overweight: BMI between 25 and 29.9.  Obese: BMI of 30 and above. Please note:  Weight includes both fat and muscle, so someone with a muscular build, such as an athlete, may have a BMI that is higher than 24.9. In cases like these, BMI is not an accurate measure of body fat.  To determine if excess body fat is the cause of a BMI of 25 or higher, further assessments may need to be done by a health care provider.  BMI is usually interpreted in the same way for men and women. Why is BMI a useful tool? BMI is useful in two ways:  Identifying a weight problem that may be related to a medical condition, or that may increase the risk for medical problems.  Promoting lifestyle and diet changes in order to reach a healthy weight. Summary  Body mass index (BMI) is a number that is calculated from a person's weight and height.  BMI may help to estimate how much of a person's weight is composed of fat. BMI can help identify those who may be at higher risk for certain medical problems.  BMI can be measured using English measurements or metric measurements.  To interpret your results, your health care provider will use BMI charts to identify whether you are underweight, normal weight, overweight, or obese. This information is not intended to replace advice given to you by your health care provider. Make sure you discuss any questions you have with your health care provider. Document Released: 11/02/2003 Document Revised: 02/02/2017 Document Reviewed: 01/03/2017 Elsevier  Patient Education  2020 [employer]

## 2018-11-29 ENCOUNTER — Other Ambulatory Visit: Payer: Self-pay

## 2018-12-02 ENCOUNTER — Other Ambulatory Visit: Payer: Self-pay

## 2018-12-02 ENCOUNTER — Ambulatory Visit (INDEPENDENT_AMBULATORY_CARE_PROVIDER_SITE_OTHER): Payer: Medicare Other

## 2018-12-02 DIAGNOSIS — Z23 Encounter for immunization: Secondary | ICD-10-CM | POA: Diagnosis not present

## 2019-01-31 ENCOUNTER — Other Ambulatory Visit: Payer: Self-pay | Admitting: Nurse Practitioner

## 2019-02-21 ENCOUNTER — Other Ambulatory Visit: Payer: Self-pay | Admitting: Nurse Practitioner

## 2019-02-21 DIAGNOSIS — Z952 Presence of prosthetic heart valve: Secondary | ICD-10-CM

## 2019-02-21 NOTE — Telephone Encounter (Signed)
Patient has appt scheduled 04/03/2018.

## 2019-03-19 ENCOUNTER — Other Ambulatory Visit: Payer: Self-pay | Admitting: Nurse Practitioner

## 2019-03-19 DIAGNOSIS — I10 Essential (primary) hypertension: Secondary | ICD-10-CM

## 2019-04-03 ENCOUNTER — Other Ambulatory Visit: Payer: Self-pay

## 2019-04-04 ENCOUNTER — Encounter: Payer: Self-pay | Admitting: Nurse Practitioner

## 2019-04-04 ENCOUNTER — Ambulatory Visit (INDEPENDENT_AMBULATORY_CARE_PROVIDER_SITE_OTHER): Payer: Medicare Other | Admitting: Nurse Practitioner

## 2019-04-04 VITALS — BP 145/81 | HR 58 | Temp 97.2°F | Resp 20 | Ht 59.0 in | Wt 217.0 lb

## 2019-04-04 DIAGNOSIS — E782 Mixed hyperlipidemia: Secondary | ICD-10-CM | POA: Diagnosis not present

## 2019-04-04 DIAGNOSIS — Z952 Presence of prosthetic heart valve: Secondary | ICD-10-CM | POA: Diagnosis not present

## 2019-04-04 DIAGNOSIS — I10 Essential (primary) hypertension: Secondary | ICD-10-CM | POA: Diagnosis not present

## 2019-04-04 MED ORDER — METOPROLOL TARTRATE 25 MG PO TABS: 12.5000 mg | ORAL_TABLET | Freq: Two times a day (BID) | ORAL | 1 refills | Status: DC

## 2019-04-04 MED ORDER — LISINOPRIL 10 MG PO TABS: 10.0000 mg | ORAL_TABLET | Freq: Every day | ORAL | 1 refills | Status: DC

## 2019-04-04 NOTE — Patient Instructions (Signed)
Exercising to Stay Healthy To become healthy and stay healthy, it is recommended that you do moderate-intensity and vigorous-intensity exercise. You can tell that you are exercising at a moderate intensity if your heart starts beating faster and you start breathing faster but can still hold a conversation. You can tell that you are exercising at a vigorous intensity if you are breathing much harder and faster and cannot hold a conversation while exercising. Exercising regularly is important. It has many health benefits, such as:  Improving overall fitness, flexibility, and endurance.  Increasing bone density.  Helping with weight control.  Decreasing body fat.  Increasing muscle strength.  Reducing stress and tension.  Improving overall health. How often should I exercise? Choose an activity that you enjoy, and set realistic goals. Your health care provider can help you make an activity plan that works for you. Exercise regularly as told by your health care provider. This may include:  Doing strength training two times a week, such as: ? Lifting weights. ? Using resistance bands. ? Push-ups. ? Sit-ups. ? Yoga.  Doing a certain intensity of exercise for a given amount of time. Choose from these options: ? A total of 150 minutes of moderate-intensity exercise every week. ? A total of 75 minutes of vigorous-intensity exercise every week. ? A mix of moderate-intensity and vigorous-intensity exercise every week. Children, pregnant women, people who have not exercised regularly, people who are overweight, and older adults may need to talk with a health care provider about what activities are safe to do. If you have a medical condition, be sure to talk with your health care provider before you start a new exercise program. What are some exercise ideas? Moderate-intensity exercise ideas include:  Walking 1 mile (1.6 km) in about 15  minutes.  Biking.  Hiking.  Golfing.  Dancing.  Water aerobics. Vigorous-intensity exercise ideas include:  Walking 4.5 miles (7.2 km) or more in about 1 hour.  Jogging or running 5 miles (8 km) in about 1 hour.  Biking 10 miles (16.1 km) or more in about 1 hour.  Lap swimming.  Roller-skating or in-line skating.  Cross-country skiing.  Vigorous competitive sports, such as football, basketball, and soccer.  Jumping rope.  Aerobic dancing. What are some everyday activities that can help me to get exercise?  Yard work, such as: ? Pushing a lawn mower. ? Raking and bagging leaves.  Washing your car.  Pushing a stroller.  Shoveling snow.  Gardening.  Washing windows or floors. How can I be more active in my day-to-day activities?  Use stairs instead of an elevator.  Take a walk during your lunch break.  If you drive, park your car farther away from your work or school.  If you take public transportation, get off one stop early and walk the rest of the way.  Stand up or walk around during all of your indoor phone calls.  Get up, stretch, and walk around every 30 minutes throughout the day.  Enjoy exercise with a friend. Support to continue exercising will help you keep a regular routine of activity. What guidelines can I follow while exercising?  Before you start a new exercise program, talk with your health care provider.  Do not exercise so much that you hurt yourself, feel dizzy, or get very short of breath.  Wear comfortable clothes and wear shoes with good support.  Drink plenty of water while you exercise to prevent dehydration or heat stroke.  Work out until your breathing   and your heartbeat get faster. Where to find more information  [place].S[place] Department of Health and Human Services: www.hhs.gov  Centers for Disease Control and Prevention (CDC): www.cdc.gov Summary  Exercising regularly is important. It will improve your overall fitness,  flexibility, and endurance.  Regular exercise also will improve your overall health. It can help you control your weight, reduce stress, and improve your bone density.  Do not exercise so much that you hurt yourself, feel dizzy, or get very short of breath.  Before you start a new exercise program, talk with your health care provider. This information is not intended to replace advice given to you by your health care provider. Make sure you discuss any questions you have with your health care provider. Document Revised: 02/02/2017 Document Reviewed: 01/11/2017 Elsevier Patient Education  2020 Elsevier Inc.  

## 2019-04-04 NOTE — Progress Notes (Signed)
Subjective:    Patient ID: Angela Dawson, female    DOB: 09-09-1945, 74 y.o.   MRN: [id number]   Chief Complaint: Medical Management of Chronic Issues    HPI:  1. Essential hypertension Checks BP at home about once a week-usually lower at home (120s/60s). No chest pain, SOB, headache. BP Readings from Last 3 Encounters:  04/04/19 (!) 145/81  10/01/18 137/78  08/14/18 (!) 125/49     2. Mixed hyperlipidemia Does not take medication for cholesterol. Usually tries to watch diet but has gained weight recently. She says heart doctor does not want her on any statins.due to myalgia. Lab Results  Component Value Date   CHOL 206 (H) 10/01/2018   HDL 37 (L) 10/01/2018   LDLCALC 135 (H) 10/01/2018   TRIG 170 (H) 10/01/2018   CHOLHDL 5.6 (H) 10/01/2018     3. S/P AVR (aortic valve replacement) Surgery done in 2017. Cardiology last seen via televisit 08/14/18 with Dr. Harl Bowie. According to office note, no change in POC. F/U scheduled in 6 months. No issues at this time.  4. Severe obesity (BMI >= 40) (HCC) Weight is up 7lbs since last visit. Wt Readings from Last 3 Encounters:  04/04/19 217 lb (98.4 kg)  10/16/18 210 lb 1.6 oz (95.3 kg)  10/01/18 210 lb (95.3 kg)   BMI Readings from Last 3 Encounters:  04/04/19 43.83 kg/m  10/16/18 42.43 kg/m  10/01/18 42.41 kg/m       Outpatient Encounter Medications as of 04/04/2019  Medication Sig  . acetaminophen (TYLENOL) 325 MG tablet Take 2 tablets (650 mg total) by mouth every 6 (six) hours as needed for mild pain.  Marland Kitchen aspirin EC 81 MG tablet Take 81 mg by mouth daily.  . Calcium Carb-Cholecalciferol (CALCIUM 600+D3) 600-800 MG-UNIT TABS Take by mouth.  . cetirizine (ZYRTEC) 10 MG tablet TAKE 1 TABLET EVERY DAY  . Cholecalciferol (VITAMIN D) 2000 units CAPS Take 2,000 Units by mouth daily.  Marland Kitchen lisinopril (ZESTRIL) 10 MG tablet TAKE 1 TABLET (10 MG TOTAL) BY MOUTH DAILY.  . metoprolol tartrate (LOPRESSOR) 25 MG tablet TAKE 1/2  TABLET TWICE DAILY  ( NEED MD APPOINTMENT FOR REFILLS  )   No facility-administered encounter medications on file as of 04/04/2019.    Past Surgical History:  Procedure Laterality Date  . AORTIC VALVE REPLACEMENT N/A 01/17/2016   Procedure: AORTIC VALVE REPLACEMENT (AVR) USING 21MM EDWARDS MAGNA EASE PERICARDIAL BIOPROSTHESIS VALVE;  Surgeon: Grace Isaac, MD;  Location: [facility];  Service: Open Heart Surgery;  Laterality: N/A;  . CARDIAC CATHETERIZATION N/A 09/24/2015   Procedure: Right/Left Heart Cath and Coronary Angiography;  Surgeon: Jettie Booze, MD;  Location: [facility];  Service: Cardiovascular;  Laterality: N/A;  . COLONOSCOPY    . REPLACEMENT ASCENDING AORTA N/A 01/17/2016   Procedure: REPLACEMENT ASCENDING AORTA USING HEMASHIELD PLATINUM 28MM WOVEN DOUBLE VELOUR VASCULAR GRAFT;  Surgeon: Grace Isaac, MD;  Location: [facility];  Service: Open Heart Surgery;  Laterality: N/A;  . TEE WITHOUT CARDIOVERSION N/A 01/17/2016   Procedure: TRANSESOPHAGEAL ECHOCARDIOGRAM (TEE);  Surgeon: Grace Isaac, MD;  Location: [facility];  Service: Open Heart Surgery;  Laterality: N/A;  . TUBAL LIGATION      Family History  Problem Relation Age of Onset  . Alzheimer's disease Mother   . Heart disease Mother   . Heart attack Father 44  . Post-traumatic stress disorder Brother     New complaints: No dieting. Little physical activity. Push mows  yard in spring/summer.  Social history: Lives alone with dog. Has 1 son. Has been widow for 4 years but separated for 25 years.  Controlled substance contract: n/a     Review of Systems  Constitutional: Negative for diaphoresis.  Eyes: Negative for pain.  Respiratory: Negative for shortness of breath.   Cardiovascular: Negative for chest pain, palpitations and leg swelling.  Gastrointestinal: Negative for abdominal pain.  Endocrine: Negative for polydipsia.  Skin: Negative for rash.  Neurological: Negative for dizziness,  weakness and headaches.  Hematological: Does not bruise/bleed easily.  All other systems reviewed and are negative.      Objective:   Physical Exam Vitals and nursing note reviewed.  Constitutional:      General: She is not in acute distress.    Appearance: Normal appearance. She is well-developed.  HENT:     Head: Normocephalic.     Nose: Nose normal.  Eyes:     Pupils: Pupils are equal, round, and reactive to light.  Neck:     Vascular: No carotid bruit or JVD.  Cardiovascular:     Rate and Rhythm: Normal rate and regular rhythm.     Heart sounds: Normal heart sounds.  Pulmonary:     Effort: Pulmonary effort is normal. No respiratory distress.     Breath sounds: Normal breath sounds. No wheezing or rales.  Chest:     Chest wall: No tenderness.  Abdominal:     General: Bowel sounds are normal. There is no distension or abdominal bruit.     Palpations: Abdomen is soft. There is no hepatomegaly, splenomegaly, mass or pulsatile mass.     Tenderness: There is no abdominal tenderness.  Musculoskeletal:        General: Normal range of motion.     Cervical back: Normal range of motion and neck supple.  Lymphadenopathy:     Cervical: No cervical adenopathy.  Skin:    General: Skin is warm and dry.  Neurological:     Mental Status: She is alert and oriented to person, place, and time.     Deep Tendon Reflexes: Reflexes are normal and symmetric.  Psychiatric:        Behavior: Behavior normal.        Thought Content: Thought content normal.        Judgment: Judgment normal.     BP (!) 145/81   Pulse (!) 58   Temp (!) 97.2 F (36.2 C) (Temporal)   Resp 20   Ht 4\' 11"  (1.499 m)   Wt 217 lb (98.4 kg)   SpO2 96%   BMI 43.83 kg/m       Assessment & Plan:  Angela Dawson comes in today with chief complaint of Medical Management of Chronic Issues   Diagnosis and orders addressed:  1. Essential hypertension Low sodium diet - lisinopril (ZESTRIL) 10 MG tablet; Take 1  tablet (10 mg total) by mouth daily.  Dispense: 90 tablet; Refill: 1  2. Mixed hyperlipidemia Low fat diet Add red yeast rice    3. S/P AVR (aortic valve replacement) Keep follo wup with cardiology - metoprolol tartrate (LOPRESSOR) 25 MG tablet; Take 0.5 tablets (12.5 mg total) by mouth 2 (two) times daily.  Dispense: 90 tablet; Refill: 1  4. Severe obesity (BMI >= 40) (HCC) Discussed diet and exercise for person with BMI >25 Will recheck weight in 3-6 months    Labs pending Health Maintenance reviewed Diet and exercise encouraged  Follow up plan: 6 months  Mary-Margaret Hassell Done, FNP

## 2019-04-05 LAB — CMP14+EGFR
ALT: 13 IU/L (ref 0–32)
AST: 19 IU/L (ref 0–40)
Albumin/Globulin Ratio: 1.7 (ref 1.2–2.2)
Albumin: 4.3 g/dL (ref 3.7–4.7)
Alkaline Phosphatase: 73 IU/L (ref 39–117)
BUN/Creatinine Ratio: 15 (ref 12–28)
BUN: 13 mg/dL (ref 8–27)
Bilirubin Total: 0.3 mg/dL (ref 0.0–1.2)
CO2: 26 mmol/L (ref 20–29)
Calcium: 9.6 mg/dL (ref 8.7–10.3)
Chloride: 101 mmol/L (ref 96–106)
Creatinine, Ser: 0.85 mg/dL (ref 0.57–1.00)
GFR calc Af Amer: 79 mL/min/{1.73_m2} (ref >59)
GFR calc non Af Amer: 69 mL/min/{1.73_m2} (ref >59)
Globulin, Total: 2.6 g/dL (ref 1.5–4.5)
Glucose: 87 mg/dL (ref 65–99)
Potassium: 4.5 mmol/L (ref 3.5–5.2)
Sodium: 142 mmol/L (ref 134–144)
Total Protein: 6.9 g/dL (ref 6.0–8.5)

## 2019-04-05 LAB — LIPID PANEL
Chol/HDL Ratio: 4.9 ratio — ABNORMAL HIGH (ref 0.0–4.4)
Cholesterol, Total: 226 mg/dL — ABNORMAL HIGH (ref 100–199)
HDL: 46 mg/dL (ref >39)
LDL Chol Calc (NIH): 156 mg/dL — ABNORMAL HIGH (ref 0–99)
Triglycerides: 132 mg/dL (ref 0–149)
VLDL Cholesterol Cal: 24 mg/dL (ref 5–40)

## 2019-04-05 LAB — CBC WITH DIFFERENTIAL/PLATELET
Basophils Absolute: 0.1 10*3/uL (ref 0.0–0.2)
Basos: 1 %
EOS (ABSOLUTE): 0.2 10*3/uL (ref 0.0–0.4)
Eos: 3 %
Hematocrit: 42.3 % (ref 34.0–46.6)
Hemoglobin: 14.6 g/dL (ref 11.1–15.9)
Immature Grans (Abs): 0 10*3/uL (ref 0.0–0.1)
Immature Granulocytes: 0 %
Lymphocytes Absolute: 4.4 10*3/uL — ABNORMAL HIGH (ref 0.7–3.1)
Lymphs: 50 %
MCH: 33.3 pg — ABNORMAL HIGH (ref 26.6–33.0)
MCHC: 34.5 g/dL (ref 31.5–35.7)
MCV: 97 fL (ref 79–97)
Monocytes Absolute: 0.5 10*3/uL (ref 0.1–0.9)
Monocytes: 6 %
Neutrophils Absolute: 3.5 10*3/uL (ref 1.4–7.0)
Neutrophils: 40 %
Platelets: 242 10*3/uL (ref 150–450)
RBC: 4.38 x10E6/uL (ref 3.77–5.28)
RDW: 12 % (ref 11.7–15.4)
WBC: 8.7 10*3/uL (ref 3.4–10.8)

## 2019-04-15 DIAGNOSIS — Z1231 Encounter for screening mammogram for malignant neoplasm of breast: Secondary | ICD-10-CM | POA: Diagnosis not present

## 2019-04-15 DIAGNOSIS — Z23 Encounter for immunization: Secondary | ICD-10-CM | POA: Diagnosis not present

## 2019-05-13 DIAGNOSIS — Z23 Encounter for immunization: Secondary | ICD-10-CM | POA: Diagnosis not present

## 2019-05-30 ENCOUNTER — Other Ambulatory Visit: Payer: Self-pay

## 2019-05-30 ENCOUNTER — Encounter: Payer: Self-pay | Admitting: Family

## 2019-05-30 ENCOUNTER — Ambulatory Visit (INDEPENDENT_AMBULATORY_CARE_PROVIDER_SITE_OTHER): Payer: Medicare Other | Admitting: Family

## 2019-05-30 VITALS — BP 141/72 | HR 65 | Temp 96.0°F | Ht 59.0 in | Wt 210.0 lb

## 2019-05-30 DIAGNOSIS — L03119 Cellulitis of unspecified part of limb: Secondary | ICD-10-CM | POA: Diagnosis not present

## 2019-05-30 DIAGNOSIS — M79671 Pain in right foot: Secondary | ICD-10-CM | POA: Diagnosis not present

## 2019-05-30 MED ORDER — DICLOFENAC SODIUM 75 MG PO TBEC: 75.0000 mg | DELAYED_RELEASE_TABLET | Freq: Two times a day (BID) | ORAL | 0 refills | Status: DC

## 2019-05-30 MED ORDER — DOXYCYCLINE HYCLATE 100 MG PO TABS: 100.0000 mg | ORAL_TABLET | Freq: Two times a day (BID) | ORAL | 0 refills | Status: DC

## 2019-05-30 NOTE — Progress Notes (Signed)
Subjective:    Patient ID: Angela Dawson, female    DOB: [birth year], 74 y.o.   MRN: [id number]  Chief Complaint  Patient presents with  . Foot Pain    right foot swollen and pain    HPI Pt presents to the office today with right lower foot pain that started Tuesday morning. She reports it has slightly improves, but continues to be constant pain of 9 out 10. Denies any injury.   She reports the area is red, swollen, and very tender. She states it hurts to lay anything onto the foot. Denies any hx of gout.    Review of Systems  All other systems reviewed and are negative.      Objective:   Physical Exam Vitals reviewed.  Constitutional:      General: She is not in acute distress.    Appearance: She is well-developed.  HENT:     Head: Normocephalic and atraumatic.  Eyes:     Pupils: Pupils are equal, round, and reactive to light.  Neck:     Thyroid: No thyromegaly.  Cardiovascular:     Rate and Rhythm: Normal rate and regular rhythm.     Heart sounds: Normal heart sounds. No murmur.  Pulmonary:     Effort: Pulmonary effort is normal. No respiratory distress.     Breath sounds: Normal breath sounds. No wheezing.  Abdominal:     General: Bowel sounds are normal. There is no distension.     Palpations: Abdomen is soft.     Tenderness: There is no abdominal tenderness.  Musculoskeletal:        General: No tenderness. Normal range of motion.     Cervical back: Normal range of motion and neck supple.       Feet:  Feet:     Comments: Right erythemas tenderness with moderate swelling Skin:    General: Skin is warm and dry.  Neurological:     Mental Status: She is alert and oriented to person, place, and time.     Cranial Nerves: No cranial nerve deficit.     Deep Tendon Reflexes: Reflexes are normal and symmetric.  Psychiatric:        Behavior: Behavior normal.        Thought Content: Thought content normal.        Judgment: Judgment normal.      BP (!) 141/72    Pulse 65   Temp (!) 96 F (35.6 C) (Temporal)   Ht 4' 11" (1.499 m)   Wt 210 lb (95.3 kg)   SpO2 96%   BMI 42.41 kg/m       Assessment & Plan:  Angela Dawson comes in today with chief complaint of Foot Pain (right foot swollen and pain)   Diagnosis and orders addressed:  1. Cellulitis of foot - BMP8+EGFR - Uric acid - doxycycline (VIBRA-TABS) 100 MG tablet; Take 1 tablet (100 mg total) by mouth 2 (two) times daily.  Dispense: 20 tablet; Refill: 0 - diclofenac (VOLTAREN) 75 MG EC tablet; Take 1 tablet (75 mg total) by mouth 2 (two) times daily.  Dispense: 30 tablet; Refill: 0  2. Right foot pain - BMP8+EGFR - Uric acid - diclofenac (VOLTAREN) 75 MG EC tablet; Take 1 tablet (75 mg total) by mouth 2 (two) times daily.  Dispense: 30 tablet; Refill: 0   Given location and the quarter size erythemas, I am thinking this is more cellulites. She will start doxycycline today and will add diclofenac  BID for 5 days in case her uric acid is elevated.  Force fluids    Christy Hawks, FNP  

## 2019-05-30 NOTE — Patient Instructions (Signed)

## 2019-05-31 LAB — CBC WITH DIFFERENTIAL/PLATELET
Basophils Absolute: 0.1 10*3/uL (ref 0.0–0.2)
Basos: 1 %
EOS (ABSOLUTE): 0.2 10*3/uL (ref 0.0–0.4)
Eos: 2 %
Hematocrit: 40.9 % (ref 34.0–46.6)
Hemoglobin: 14 g/dL (ref 11.1–15.9)
Immature Grans (Abs): 0 10*3/uL (ref 0.0–0.1)
Immature Granulocytes: 0 %
Lymphocytes Absolute: 5 10*3/uL — ABNORMAL HIGH (ref 0.7–3.1)
Lymphs: 45 %
MCH: 33.1 pg — ABNORMAL HIGH (ref 26.6–33.0)
MCHC: 34.2 g/dL (ref 31.5–35.7)
MCV: 97 fL (ref 79–97)
Monocytes Absolute: 0.7 10*3/uL (ref 0.1–0.9)
Monocytes: 6 %
Neutrophils Absolute: 5.1 10*3/uL (ref 1.4–7.0)
Neutrophils: 46 %
Platelets: 300 10*3/uL (ref 150–450)
RBC: 4.23 x10E6/uL (ref 3.77–5.28)
RDW: 12.1 % (ref 11.7–15.4)
WBC: 11 10*3/uL — ABNORMAL HIGH (ref 3.4–10.8)

## 2019-05-31 LAB — BMP8+EGFR
BUN/Creatinine Ratio: 15 (ref 12–28)
BUN: 12 mg/dL (ref 8–27)
CO2: 25 mmol/L (ref 20–29)
Calcium: 9.2 mg/dL (ref 8.7–10.3)
Chloride: 104 mmol/L (ref 96–106)
Creatinine, Ser: 0.82 mg/dL (ref 0.57–1.00)
GFR calc Af Amer: 83 mL/min/{1.73_m2} (ref >59)
GFR calc non Af Amer: 72 mL/min/{1.73_m2} (ref >59)
Glucose: 104 mg/dL — ABNORMAL HIGH (ref 65–99)
Potassium: 4.3 mmol/L (ref 3.5–5.2)
Sodium: 143 mmol/L (ref 134–144)

## 2019-05-31 LAB — URIC ACID: Uric Acid: 6.5 mg/dL (ref 3.1–7.9)

## 2019-06-12 ENCOUNTER — Other Ambulatory Visit: Payer: Self-pay | Admitting: Nurse Practitioner

## 2019-06-17 ENCOUNTER — Telehealth: Payer: Self-pay | Admitting: Nurse Practitioner

## 2019-06-17 NOTE — Chronic Care Management (AMB) (Signed)
  Chronic Care Management   Note  06/17/2019 Name: HAILY CALEY MRN: [id number] DOB: 09-Aug-1945  JAVAE BRAATEN is a 74 y.o. year old female who is a primary care patient of Chevis Pretty, [place]. I reached out to [name] by phone today in response to a referral sent by Ms. Connye Burkitt Gutierrez's health plan.     Ms. Gutman was given information about Chronic Care Management services today including:  1. CCM service includes personalized support from designated clinical staff supervised by her physician, including individualized plan of care and coordination with other care providers 2. 24/7 contact phone numbers for assistance for urgent and routine care needs. 3. Service will only be billed when office clinical staff spend 20 minutes or more in a month to coordinate care. 4. Only one practitioner may furnish and bill the service in a calendar month. 5. The patient may stop CCM services at any time (effective at the end of the month) by phone call to the office staff. 6. The patient will be responsible for cost sharing (co-pay) of up to 20% of the service fee (after annual deductible is met).  Patient did not agree to enrollment in care management services and does not wish to consider at this time.  Follow up plan: The patient has been provided with contact information for the care management team and has been advised to call with any health related questions or concerns.   [place], [place] [address] Direct Dial: [phone] [email or web address]: [email or web address]

## 2019-08-13 ENCOUNTER — Other Ambulatory Visit: Payer: Self-pay

## 2019-08-13 ENCOUNTER — Encounter: Payer: Self-pay | Admitting: Cardiology

## 2019-08-13 ENCOUNTER — Ambulatory Visit (INDEPENDENT_AMBULATORY_CARE_PROVIDER_SITE_OTHER): Payer: Medicare Other | Admitting: Cardiology

## 2019-08-13 VITALS — BP 138/82 | HR 70 | Ht 59.0 in | Wt 221.6 lb

## 2019-08-13 DIAGNOSIS — Q231 Congenital insufficiency of aortic valve: Secondary | ICD-10-CM

## 2019-08-13 DIAGNOSIS — I10 Essential (primary) hypertension: Secondary | ICD-10-CM | POA: Diagnosis not present

## 2019-08-13 DIAGNOSIS — Z952 Presence of prosthetic heart valve: Secondary | ICD-10-CM

## 2019-08-13 NOTE — Progress Notes (Signed)
Clinical Summary Angela Dawson is a 74 y.o.female seen today for follow up of the following medical problems.   1. Aortic stenosis with bicuspid AV and ascending aortic aneurysm.  - 09/2013 echo showed LVEF 65-70%, grade I diastolic dysfunction. Bicuspid AV with moderate to severe AS (mean grad 21, dimensionless index 0.32, valve area 0.93), mild AI. Described mild aorta dilatation from limited views, ascending aorta 4.6 cm. - CTA chest 05/2014 with asscending thoracic aortic aneurysm 4.9 cm.  01/2015 CTA chest ascending aortic aneurysm 4.6 x 4.6 cm. Forllowed by Dr Tyrone Sage.  -06/2014 echo LVEF 65-70%, AVA area 0.91, mean grad 32 - 09/2015 echo LVEF 60-65%, mod to severe AS mean grad 34, AVA 1 -7/2017cath which showed no significant CAD - 05/2016 echo:   -she is s/p AVR with [employer] Lifesciene pericardial tissue valve 32mm and supracoronary replacement of the ascending aorta on 01/18/16   - no significant chest pain. Mild SOB with high levels of activity, typically things she does not typically do.    2.HTN -she is compliant with meds - home bp's typically 130s/60s    3. Hyperlipidemia - allergic to statins. - 10/2016 TC 206 TG 101 HDL 51 LDL 135 - zetia is too expensive  - has been working on diet control, pcp started red yeast rice     SH: has had covid vaccine x2   Past Medical History:  Diagnosis Date  . Allergy    seasonal  . Chicken pox   . Complication of anesthesia    difficult to wake up after tubal ligations  . Dyspnea    with exertion  . Heart murmur   . History of bronchitis   . History of kidney stones    "only one"  . Hypertension   . Measles      Allergies  Allergen Reactions  . Crestor [Rosuvastatin]     Dizziness  . Lipitor [Atorvastatin]     Unable to stand or move  . Statins     Leg cramps   . Penicillins Rash    Has patient had a PCN reaction causing immediate rash, facial/tongue/throat swelling, SOB or  lightheadedness with hypotension: Yes Has patient had a PCN reaction causing severe rash involving mucus membranes or skin necrosis: No Has patient had a PCN reaction that required hospitalization No Has patient had a PCN reaction occurring within the last 10 years: No If all of the above answers are "NO", then may proceed with Cephalosporin use.      Current Outpatient Medications  Medication Sig Dispense Refill  . acetaminophen (TYLENOL) 325 MG tablet Take 2 tablets (650 mg total) by mouth every 6 (six) hours as needed for mild pain.    Marland Kitchen aspirin EC 81 MG tablet Take 81 mg by mouth daily.    . Calcium Carb-Cholecalciferol (CALCIUM 600+D3) 600-800 MG-UNIT TABS Take by mouth.    . cetirizine (ZYRTEC) 10 MG tablet TAKE 1 TABLET EVERY DAY 90 tablet 1  . Cholecalciferol (VITAMIN D) 2000 units CAPS Take 2,000 Units by mouth daily.    . diclofenac (VOLTAREN) 75 MG EC tablet Take 1 tablet (75 mg total) by mouth 2 (two) times daily. 30 tablet 0  . doxycycline (VIBRA-TABS) 100 MG tablet Take 1 tablet (100 mg total) by mouth 2 (two) times daily. 20 tablet 0  . lisinopril (ZESTRIL) 10 MG tablet Take 1 tablet (10 mg total) by mouth daily. 90 tablet 1  . metoprolol tartrate (LOPRESSOR) 25 MG tablet Take  0.5 tablets (12.5 mg total) by mouth 2 (two) times daily. 90 tablet 1  . Red Yeast Rice 600 MG TABS Take by mouth.     No current facility-administered medications for this visit.     Past Surgical History:  Procedure Laterality Date  . AORTIC VALVE REPLACEMENT N/A 01/17/2016   Procedure: AORTIC VALVE REPLACEMENT (AVR) USING 21MM EDWARDS MAGNA EASE PERICARDIAL BIOPROSTHESIS VALVE;  Surgeon: Grace Isaac, MD;  Location: [place];  Service: Open Heart Surgery;  Laterality: N/A;  . CARDIAC CATHETERIZATION N/A 09/24/2015   Procedure: Right/Left Heart Cath and Coronary Angiography;  Surgeon: Jettie Booze, MD;  Location: [facility];  Service: Cardiovascular;  Laterality: N/A;  .  COLONOSCOPY    . REPLACEMENT ASCENDING AORTA N/A 01/17/2016   Procedure: REPLACEMENT ASCENDING AORTA USING HEMASHIELD PLATINUM 28MM WOVEN DOUBLE VELOUR VASCULAR GRAFT;  Surgeon: Grace Isaac, MD;  Location: [place];  Service: Open Heart Surgery;  Laterality: N/A;  . TEE WITHOUT CARDIOVERSION N/A 01/17/2016   Procedure: TRANSESOPHAGEAL ECHOCARDIOGRAM (TEE);  Surgeon: Grace Isaac, MD;  Location: [place];  Service: Open Heart Surgery;  Laterality: N/A;  . TUBAL LIGATION       Allergies  Allergen Reactions  . Crestor [Rosuvastatin]     Dizziness  . Lipitor [Atorvastatin]     Unable to stand or move  . Statins     Leg cramps   . Penicillins Rash    Has patient had a PCN reaction causing immediate rash, facial/tongue/throat swelling, SOB or lightheadedness with hypotension: Yes Has patient had a PCN reaction causing severe rash involving mucus membranes or skin necrosis: No Has patient had a PCN reaction that required hospitalization No Has patient had a PCN reaction occurring within the last 10 years: No If all of the above answers are "NO", then may proceed with Cephalosporin use.       Family History  Problem Relation Age of Onset  . Alzheimer's disease Mother   . Heart disease Mother   . Heart attack Father 24  . Post-traumatic stress disorder Brother      Social History Ms. Brunner reports that she quit smoking about 45 years ago. Her smoking use included cigarettes. She started smoking about 51 years ago. She has a 2.50 pack-year smoking history. She has never used smokeless tobacco. Ms. Buttrey reports no history of alcohol use.   Review of Systems CONSTITUTIONAL: No weight loss, fever, chills, weakness or fatigue.  HEENT: Eyes: No visual loss, blurred vision, double vision or yellow sclerae.No hearing loss, sneezing, congestion, runny nose or sore throat.  SKIN: No rash or itching.  CARDIOVASCULAR: per hpi RESPIRATORY: No shortness of breath, cough or sputum.    GASTROINTESTINAL: No anorexia, nausea, vomiting or diarrhea. No abdominal pain or blood.  GENITOURINARY: No burning on urination, no polyuria NEUROLOGICAL: No headache, dizziness, syncope, paralysis, ataxia, numbness or tingling in the extremities. No change in bowel or bladder control.  MUSCULOSKELETAL: No muscle, back pain, joint pain or stiffness.  LYMPHATICS: No enlarged nodes. No history of splenectomy.  PSYCHIATRIC: No history of depression or anxiety.  ENDOCRINOLOGIC: No reports of sweating, cold or heat intolerance. No polyuria or polydipsia.  Marland Kitchen   Physical Examination Today's Vitals   08/13/19 1139  BP: 138/82  Pulse: 70  SpO2: 94%  Weight: 221 lb 9.6 oz (100.5 kg)  Height: 4\' 11"  (1.499 m)   Body mass index is 44.76 kg/m.  Gen: resting comfortably, no acute distress HEENT: no scleral  icterus, pupils equal round and reactive, no palptable cervical adenopathy,  CV: RRR, no m/r/g, no jvd Resp: Clear to auscultation bilaterally GI: abdomen is soft, non-tender, non-distended, normal bowel sounds, no hepatosplenomegaly MSK: extremities are warm, no edema.  Skin: warm, no rash Neuro:  no focal deficits Psych: appropriate affect   Diagnostic Studies 09/2013 Echo Study Conclusions  - Left ventricle: The cavity size was normal. Wall thickness was increased in a pattern of mild LVH. Systolic function was vigorous. The estimated ejection fraction was in the range of 65% to 70%. Wall motion was normal; there were no regional wall motion abnormalities. Doppler parameters are consistent with abnormal left ventricular relaxation (grade 1 diastolic dysfunction). - Aortic valve: Bicuspid; moderately calcified leaflets. Cusp separation was reduced. There was moderate to severe stenosis. There was mild regurgitation. Mean gradient (S): 21 mm Hg. LVOT/AV VTI ratio 0.32. Peak gradient (S): 40 mm Hg. Valve area (VTI): 0.93 cm^2. Valve area (Vmax): 0.96 cm^2. - Ascending aorta: The  ascending aorta was moderately dilated. Approximately 46 mm. - Mitral valve: Calcified annulus. There was trivial regurgitation. - Right atrium: Central venous pressure (est): 3 mm Hg. - Atrial septum: No defect or patent foramen ovale was identified. - Tricuspid valve: There was trivial regurgitation. - Pulmonary arteries: PA peak pressure: 29 mm Hg (S). - Pericardium, extracardiac: There was no pericardial effusion.  Impressions:  - Mild LVH with LVEF 65-70%, grade 1 diastolic dysfunction. Moderate to severe aortic stenosis as outlined with bicuspid aortic valve and mild aortic regurgitation. There is also at least moderate dilatation of the ascending aorta based on limited views. Given association of aortic aneurysmal disease with bicuspid aortic valve, would suggest further dedicated imaging of the thoracic aorta with CTA or MRA. Normal PASP 29 mmHg.   05/2014 CTA chest IMPRESSION: Maximal diameter of the ascending aorta is 4.9 cm compare with 4.8 cm on the prior study. No evidence of dissection.  There are airspace and ground-glass opacities in the left upper lobe most consistent with an inflammatory process. There is also a 2 mm nodule. If the patient is at high risk for bronchogenic carcinoma, follow-up chest CT at 1 year is recommended. If the patient is at low risk, no follow-up is needed. This recommendation follows the consensus statement: Guidelines for Management of Small Pulmonary Nodules Detected on CT Scans: A Statement from the Fleischner Society as published in Radiology 2005; 237:395-400.   ADDENDUM: The patient therefore has a thoracic aortic aneurysm that has only increased from 4.8 cm to 4.9 cm since August of last year. Ascending thoracic aortic aneurysm. Recommend semi-annual imaging followup by CTA or MRA and referral to cardiothoracic surgery if not already obtained. This recommendation follows 2010 ACCF/AHA/AATS/ACR/ASA/SCA/SCAI/SIR/STS/SVM  Guidelines for the Diagnosis and Management of Patients With Thoracic Aortic Disease. Circulation. 2010; 121: P546-F681   06/2014 Echo Study Conclusions  - Left ventricle: The cavity size was normal. Systolic function was vigorous. The estimated ejection fraction was in the range of 65% to 70%. Wall motion was normal; there were no regional wall motion abnormalities. Features are consistent with a pseudonormal left ventricular filling pattern, with concomitant abnormal relaxation and increased filling pressure (grade 2 diastolic dysfunction). Doppler parameters are consistent with high ventricular filling pressure. Mild to moderate concentric left ventricular hypertrophy. - Aortic valve: Mildly to moderately calcified annulus. Moderately thickened, severely calcified leaflets. Cusp separation was reduced. There was moderate to severe stenosis. There was mild regurgitation. Peak velocity (S): 364 cm/s. Mean gradient: 32 mmHg. Valve  area (Vmax): 0.91 cm^2. - Aorta: Ascending aortic diameter: 43 mm (S). Mild ascending aortic dilatation. - Mitral valve: Mildly to moderately calcified annulus. There was mild regurgitation.  Impressions:  - When compared to the report dated 10/02/13, the mean aortic valve gradient has increased by 11 mmHg. Moderate to severe stenosis is seen.  01/2015 CTA chest IMPRESSION: Dilatation of the thoracic aorta in its ascending component measuring 4.6 x 4.6 cm in greatest dimension. This is likely stable from the prior exam as some motion artifact was seen on the prior study.  09/2015 echo Study Conclusions  - Left ventricle: The cavity size was normal. Wall thickness was  increased in a pattern of moderate LVH. Systolic function was  normal. The estimated ejection fraction was in the range of 60%  to 65%. Wall motion was normal; there were no regional wall  motion abnormalities. Features are consistent with a  pseudonormal  left ventricular filling pattern, with concomitant abnormal  relaxation and increased filling pressure (grade 2 diastolic  dysfunction). Doppler parameters are consistent with high  ventricular filling pressure. - Aortic valve: Moderately thickened, severely calcified leaflets.  There was moderate to severe stenosis. There was mild  regurgitation. Peak velocity (S): 390 cm/s. Mean gradient (S): 34  mm Hg. Valve area (VTI): 1.01 cm^2. Valve area (Vmax): 0.89 cm^2.  Valve area (Vmean): 0.93 cm^2. - Aorta: Ascending aortic diameter: 46 mm (S). - Ascending aorta: The ascending aorta was mild to moderately  dilated. - Mitral valve: Calcified annulus. There was mild regurgitation. - Left atrium: The atrium was moderately dilated. - Right ventricle: Systolic function was mildly to moderately  reduced. - Tricuspid valve: There was mild-moderate regurgitation.  09/2015 Cath  The left ventricular systolic function is normal.  Nonobstructive CAD.  Normal PA pressures. CI 2.3; PA sat 68%.  Dilated ascending aorta.  Continue with plans for AVR and aneurysm repair    Assessment and Plan  1. Bicuspid aortic valve with aortic stenosis and aortic aneurysm - s/p tissue AVR and ascending aorta replacement - doing well without significant symptoms, some SOB with high levels of exertion with activities she rarely does I think more related to deconditioning, continue to monitor.   2. HTN - home bp's at goal though midlly elevated here, continue to monitor  EKG today shows SR, no ischemic changes  F/u 1year  Antoine Poche, [name]

## 2019-08-13 NOTE — Patient Instructions (Signed)

## 2019-09-08 ENCOUNTER — Other Ambulatory Visit: Payer: Self-pay | Admitting: Nurse Practitioner

## 2019-09-08 DIAGNOSIS — Z952 Presence of prosthetic heart valve: Secondary | ICD-10-CM

## 2019-10-03 ENCOUNTER — Other Ambulatory Visit: Payer: Self-pay

## 2019-10-03 ENCOUNTER — Ambulatory Visit (INDEPENDENT_AMBULATORY_CARE_PROVIDER_SITE_OTHER): Payer: Medicare Other | Admitting: Nurse Practitioner

## 2019-10-03 ENCOUNTER — Encounter: Payer: Self-pay | Admitting: Nurse Practitioner

## 2019-10-03 VITALS — BP 130/77 | HR 81 | Temp 98.3°F | Resp 20 | Ht 59.0 in | Wt 215.0 lb

## 2019-10-03 DIAGNOSIS — Z789 Other specified health status: Secondary | ICD-10-CM | POA: Diagnosis not present

## 2019-10-03 DIAGNOSIS — Z952 Presence of prosthetic heart valve: Secondary | ICD-10-CM | POA: Diagnosis not present

## 2019-10-03 DIAGNOSIS — M25551 Pain in right hip: Secondary | ICD-10-CM

## 2019-10-03 DIAGNOSIS — I10 Essential (primary) hypertension: Secondary | ICD-10-CM | POA: Diagnosis not present

## 2019-10-03 DIAGNOSIS — M25552 Pain in left hip: Secondary | ICD-10-CM | POA: Diagnosis not present

## 2019-10-03 DIAGNOSIS — E782 Mixed hyperlipidemia: Secondary | ICD-10-CM

## 2019-10-03 MED ORDER — LISINOPRIL 10 MG PO TABS: 10.0000 mg | ORAL_TABLET | Freq: Every day | ORAL | 1 refills | Status: DC

## 2019-10-03 MED ORDER — METOPROLOL TARTRATE 25 MG PO TABS: ORAL_TABLET | ORAL | 1 refills | Status: DC

## 2019-10-03 MED ORDER — CETIRIZINE HCL 10 MG PO TABS: 10.0000 mg | ORAL_TABLET | Freq: Every day | ORAL | 1 refills | Status: DC

## 2019-10-03 MED ORDER — MELOXICAM 15 MG PO TABS: 15.0000 mg | ORAL_TABLET | Freq: Every day | ORAL | 1 refills | Status: DC

## 2019-10-03 NOTE — Patient Instructions (Signed)
Stress, Adult Stress is a normal reaction to life events. Stress is what you feel when life demands more than you are used to, or more than you think you can handle. Some stress can be useful, such as studying for a test or meeting a deadline at work. Stress that occurs too often or for too long can cause problems. It can affect your emotional health and interfere with relationships and normal daily activities. Too much stress can weaken your body's defense system (immune system) and increase your risk for physical illness. If you already have a medical problem, stress can make it worse. What are the causes? All sorts of life events can cause stress. An event that causes stress for one person may not be stressful for another person. Major life events, whether positive or negative, commonly cause stress. Examples include:  Losing a job or starting a new job.  Losing a loved one.  Moving to a new town or home.  Getting married or divorced.  Having a baby.  Getting injured or sick. Less obvious life events can also cause stress, especially if they occur day after day or in combination with each other. Examples include:  Working long hours.  Driving in traffic.  Caring for children.  Being in debt.  Being in a difficult relationship. What are the signs or symptoms? Stress can cause emotional symptoms, including:  Anxiety. This is feeling worried, afraid, on edge, overwhelmed, or out of control.  Anger, including irritation or impatience.  Depression. This is feeling sad, down, helpless, or guilty.  Trouble focusing, remembering, or making decisions. Stress can cause physical symptoms, including:  Aches and pains. These may affect your head, neck, back, stomach, or other areas of your body.  Tight muscles or a clenched jaw.  Low energy.  Trouble sleeping. Stress can cause unhealthy behaviors, including:  Eating to feel better (overeating) or skipping meals.  Working too  much or putting off tasks.  Smoking, drinking alcohol, or using drugs to feel better. How is this diagnosed? Stress is diagnosed through an assessment by your health care provider. He or she may diagnose this condition based on:  Your symptoms and any stressful life events.  Your medical history.  Tests to rule out other causes of your symptoms. Depending on your condition, your health care provider may refer you to a specialist for further evaluation. How is this treated?  Stress management techniques are the recommended treatment for stress. Medicine is not typically recommended for the treatment of stress. Techniques to reduce your reaction to stressful life events include:  Stress identification. Monitor yourself for symptoms of stress and identify what causes stress for you. These skills may help you to avoid or prepare for stressful events.  Time management. Set your priorities, keep a calendar of events, and learn to say no. Taking these actions can help you avoid making too many commitments. Techniques for coping with stress include:  Rethinking the problem. Try to think realistically about stressful events rather than ignoring them or overreacting. Try to find the positives in a stressful situation rather than focusing on the negatives.  Exercise. Physical exercise can release both physical and emotional tension. The key is to find a form of exercise that you enjoy and do it regularly.  Relaxation techniques. These relax the body and mind. The key is to find one or more that you enjoy and use the techniques regularly. Examples include: ? Meditation, deep breathing, or progressive relaxation techniques. ? Yoga or  tai chi. ? Biofeedback, mindfulness techniques, or journaling. ? Listening to music, being out in nature, or participating in other hobbies.  Practicing a healthy lifestyle. Eat a balanced diet, drink plenty of water, limit or avoid caffeine, and get plenty of  sleep.  Having a strong support network. Spend time with family, friends, or other people you enjoy being around. Express your feelings and talk things over with someone you trust. Counseling or talk therapy with a mental health professional may be helpful if you are having trouble managing stress on your own. Follow these instructions at home: Lifestyle   Avoid drugs.  Do not use any products that contain nicotine or tobacco, such as cigarettes, e-cigarettes, and chewing tobacco. If you need help quitting, ask your health care provider.  Limit alcohol intake to no more than 1 drink a day for nonpregnant women and 2 drinks a day for men. One drink equals 12 oz of beer, 5 oz of wine, or 1 oz of hard liquor  Do not use alcohol or drugs to relax.  Eat a balanced diet that includes fresh fruits and vegetables, whole grains, lean meats, fish, eggs, and beans, and low-fat dairy. Avoid processed foods and foods high in added fat, sugar, and salt.  Exercise at least 30 minutes on 5 or more days each week.  Get 7-8 hours of sleep each night. General instructions   Practice stress management techniques as discussed with your health care provider.  Drink enough fluid to keep your urine clear or pale yellow.  Take over-the-counter and prescription medicines only as told by your health care provider.  Keep all follow-up visits as told by your health care provider. This is important. Contact a health care provider if:  Your symptoms get worse.  You have new symptoms.  You feel overwhelmed by your problems and can no longer manage them on your own. Get help right away if:  You have thoughts of hurting yourself or others. If you ever feel like you may hurt yourself or others, or have thoughts about taking your own life, get help right away. You can go to your nearest emergency department or call:  Your local emergency services (911 in the [place]  A suicide crisis helpline, such as the  [place] at (316) 250-6172. This is open 24 hours a day. Summary  Stress is a normal reaction to life events. It can cause problems if it happens too often or for too long.  Practicing stress management techniques is the best way to treat stress.  Counseling or talk therapy with a mental health professional may be helpful if you are having trouble managing stress on your own. This information is not intended to replace advice given to you by your health care provider. Make sure you discuss any questions you have with your health care provider. Document Revised: 09/20/2018 Document Reviewed: 04/12/2016 Elsevier Patient Education  [place].

## 2019-10-03 NOTE — Progress Notes (Signed)
Subjective:    Patient ID: Angela Dawson, female    DOB: 1945-09-23, 74 y.o.   MRN: [id number]   Chief Complaint: Medical Management of Chronic Issues    HPI:  1. Essential hypertension No c/o chest pain, sob or headache. Does not check blood pressure at home. BP Readings from Last 3 Encounters:  10/03/19 (!) 130/77  08/13/19 138/82  05/30/19 (!) 141/72      2. Mixed hyperlipidemia Does try to watch diet, but does little to no exercise. Cannot tolerate a statin. Lab Results  Component Value Date   CHOL 226 (H) 04/04/2019   HDL 46 04/04/2019   LDLCALC 156 (H) 04/04/2019   TRIG 132 04/04/2019   CHOLHDL 4.9 (H) 04/04/2019   The 10-year ASCVD risk score Mikey Bussing DC Jr., et al., 2013) is: 20.3%   Values used to calculate the score:     Age: 56 years     Sex: Female     Is Non-Hispanic African American: No     Diabetic: No     Tobacco smoker: No     Systolic Blood Pressure: 202 mmHg     Is BP treated: Yes     HDL Cholesterol: 46 mg/dL     Total Cholesterol: 226 mg/dL   3. S/P AVR (aortic valve replacement) Is on longer on blood thinner. Just takes aspirin daily. Saw cardiology a few months ago and according to office note no changes were made toplan of care.  4. Severe obesity (BMI >= 40) (HCC) Weight is down 6lbs from last visit Wt Readings from Last 3 Encounters:  10/03/19 (!) 215 lb (97.5 kg)  08/13/19 221 lb 9.6 oz (100.5 kg)  05/30/19 210 lb (95.3 kg)   BMI Readings from Last 3 Encounters:  10/03/19 43.42 kg/m  08/13/19 44.76 kg/m  05/30/19 42.41 kg/m       Outpatient Encounter Medications as of 10/03/2019  Medication Sig  . acetaminophen (TYLENOL) 325 MG tablet Take 2 tablets (650 mg total) by mouth every 6 (six) hours as needed for mild pain.  Marland Kitchen aspirin EC 81 MG tablet Take 81 mg by mouth daily.  . Calcium Carb-Cholecalciferol (CALCIUM 600+D3) 600-800 MG-UNIT TABS Take 1 tablet by mouth daily.   . cetirizine (ZYRTEC) 10 MG tablet TAKE 1 TABLET  EVERY DAY  . Cholecalciferol (VITAMIN D) 2000 units CAPS Take 2,000 Units by mouth daily.  Marland Kitchen lisinopril (ZESTRIL) 10 MG tablet Take 1 tablet (10 mg total) by mouth daily.  . metoprolol tartrate (LOPRESSOR) 25 MG tablet TAKE 1/2 TABLET (12.5 MG TOTAL) BY MOUTH 2 (TWO) TIMES DAILY.  . Red Yeast Rice 600 MG TABS Take 1 tablet by mouth daily.      Past Surgical History:  Procedure Laterality Date  . AORTIC VALVE REPLACEMENT N/A 01/17/2016   Procedure: AORTIC VALVE REPLACEMENT (AVR) USING 21MM EDWARDS MAGNA EASE PERICARDIAL BIOPROSTHESIS VALVE;  Surgeon: Grace Isaac, MD;  Location: [place];  Service: Open Heart Surgery;  Laterality: N/A;  . CARDIAC CATHETERIZATION N/A 09/24/2015   Procedure: Right/Left Heart Cath and Coronary Angiography;  Surgeon: Jettie Booze, MD;  Location: [place] [facility];  Service: Cardiovascular;  Laterality: N/A;  . COLONOSCOPY    . REPLACEMENT ASCENDING AORTA N/A 01/17/2016   Procedure: REPLACEMENT ASCENDING AORTA USING HEMASHIELD PLATINUM 28MM WOVEN DOUBLE VELOUR VASCULAR GRAFT;  Surgeon: Grace Isaac, MD;  Location: [place];  Service: Open Heart Surgery;  Laterality: N/A;  . TEE WITHOUT CARDIOVERSION N/A 01/17/2016  Procedure: TRANSESOPHAGEAL ECHOCARDIOGRAM (TEE);  Surgeon: Grace Isaac, MD;  Location: [place];  Service: Open Heart Surgery;  Laterality: N/A;  . TUBAL LIGATION      Family History  Problem Relation Age of Onset  . Alzheimer's disease Mother   . Heart disease Mother   . Heart attack Father 70  . Post-traumatic stress disorder Brother     New complaints: bil hip pain from arthritis. Take glucosamine and chondrontin OTC and that helps some.  Social history: lives alone with her dog. Family checks on her several times a week.  Controlled substance contract: n/a    Review of Systems  Constitutional: Negative for diaphoresis.  Eyes: Negative for pain.  Respiratory: Negative for shortness of breath.   Cardiovascular:  Negative for chest pain, palpitations and leg swelling.  Gastrointestinal: Negative for abdominal pain.  Endocrine: Negative for polydipsia.  Skin: Negative for rash.  Neurological: Negative for dizziness, weakness and headaches.  Hematological: Does not bruise/bleed easily.  All other systems reviewed and are negative.      Objective:   Physical Exam Vitals and nursing note reviewed.  Constitutional:      General: She is not in acute distress.    Appearance: Normal appearance. She is well-developed.  HENT:     Head: Normocephalic.     Nose: Nose normal.  Eyes:     Pupils: Pupils are equal, round, and reactive to light.  Neck:     Vascular: No carotid bruit or JVD.  Cardiovascular:     Rate and Rhythm: Normal rate and regular rhythm.     Heart sounds: Normal heart sounds.  Pulmonary:     Effort: Pulmonary effort is normal. No respiratory distress.     Breath sounds: Normal breath sounds. No wheezing or rales.  Chest:     Chest wall: No tenderness.  Abdominal:     General: Bowel sounds are normal. There is no distension or abdominal bruit.     Palpations: Abdomen is soft. There is no hepatomegaly, splenomegaly, mass or pulsatile mass.     Tenderness: There is no abdominal tenderness.  Musculoskeletal:        General: Normal range of motion.     Cervical back: Normal range of motion and neck supple.  Lymphadenopathy:     Cervical: No cervical adenopathy.  Skin:    General: Skin is warm and dry.  Neurological:     Mental Status: She is alert and oriented to person, place, and time.     Deep Tendon Reflexes: Reflexes are normal and symmetric.  Psychiatric:        Behavior: Behavior normal.        Thought Content: Thought content normal.        Judgment: Judgment normal.    BP (!) 130/77   Pulse 81   Temp 98.3 F (36.8 C) (Temporal)   Resp 20   Ht '4\' 11"'$  (1.499 m)   Wt (!) 215 lb (97.5 kg)   SpO2 95%   BMI 43.42 kg/m         Assessment & Plan:  Angela Dawson comes in today with chief complaint of Medical Management of Chronic Issues   Diagnosis and orders addressed:  1. Essential hypertension Low sodium diet - lisinopril (ZESTRIL) 10 MG tablet; Take 1 tablet (10 mg total) by mouth daily.  Dispense: 90 tablet; Refill: 1 - CBC with Differential/Platelet  2. Mixed hyperlipidemia Low fat diet - CMP14+EGFR - Lipid panel  3.  S/P AVR (aortic valve replacement) Keep yearly follow up with cardiology - metoprolol tartrate (LOPRESSOR) 25 MG tablet; TAKE 1/2 TABLET (12.5 MG TOTAL) BY MOUTH 2 (TWO) TIMES DAILY.  Dispense: 90 tablet; Refill: 1  4. Severe obesity (BMI >= 40) (HCC) Discussed diet and exercise for person with BMI >25 Will recheck weight in 3-6 months  5. Statin intolerance Takes red yeast rice OTC  6. bil hip pain mobic '15mg'$  daily Ok to ocntinue glucosamine and chondrotin OTC  Labs pending Health Maintenance reviewed Diet and exercise encouraged  Follow up plan: 6 months   Dante, FNP

## 2019-10-04 LAB — CBC WITH DIFFERENTIAL/PLATELET
Basophils Absolute: 0.1 10*3/uL (ref 0.0–0.2)
Basos: 1 %
EOS (ABSOLUTE): 0.3 10*3/uL (ref 0.0–0.4)
Eos: 3 %
Hematocrit: 43.8 % (ref 34.0–46.6)
Hemoglobin: 15 g/dL (ref 11.1–15.9)
Immature Grans (Abs): 0 10*3/uL (ref 0.0–0.1)
Immature Granulocytes: 0 %
Lymphocytes Absolute: 4.8 10*3/uL — ABNORMAL HIGH (ref 0.7–3.1)
Lymphs: 46 %
MCH: 33 pg (ref 26.6–33.0)
MCHC: 34.2 g/dL (ref 31.5–35.7)
MCV: 96 fL (ref 79–97)
Monocytes Absolute: 0.7 10*3/uL (ref 0.1–0.9)
Monocytes: 7 %
Neutrophils Absolute: 4.5 10*3/uL (ref 1.4–7.0)
Neutrophils: 43 %
Platelets: 269 10*3/uL (ref 150–450)
RBC: 4.55 x10E6/uL (ref 3.77–5.28)
RDW: 12.6 % (ref 11.7–15.4)
WBC: 10.3 10*3/uL (ref 3.4–10.8)

## 2019-10-04 LAB — LIPID PANEL
Chol/HDL Ratio: 5.1 ratio — ABNORMAL HIGH (ref 0.0–4.4)
Cholesterol, Total: 226 mg/dL — ABNORMAL HIGH (ref 100–199)
HDL: 44 mg/dL (ref >39)
LDL Chol Calc (NIH): 157 mg/dL — ABNORMAL HIGH (ref 0–99)
Triglycerides: 137 mg/dL (ref 0–149)
VLDL Cholesterol Cal: 25 mg/dL (ref 5–40)

## 2019-10-04 LAB — CMP14+EGFR
ALT: 20 IU/L (ref 0–32)
AST: 22 IU/L (ref 0–40)
Albumin/Globulin Ratio: 1.6 (ref 1.2–2.2)
Albumin: 4.4 g/dL (ref 3.7–4.7)
Alkaline Phosphatase: 76 IU/L (ref 48–121)
BUN/Creatinine Ratio: 15 (ref 12–28)
BUN: 13 mg/dL (ref 8–27)
Bilirubin Total: 0.4 mg/dL (ref 0.0–1.2)
CO2: 23 mmol/L (ref 20–29)
Calcium: 9.7 mg/dL (ref 8.7–10.3)
Chloride: 104 mmol/L (ref 96–106)
Creatinine, Ser: 0.85 mg/dL (ref 0.57–1.00)
GFR calc Af Amer: 79 mL/min/{1.73_m2} (ref >59)
GFR calc non Af Amer: 68 mL/min/{1.73_m2} (ref >59)
Globulin, Total: 2.8 g/dL (ref 1.5–4.5)
Glucose: 105 mg/dL — ABNORMAL HIGH (ref 65–99)
Potassium: 4.1 mmol/L (ref 3.5–5.2)
Sodium: 141 mmol/L (ref 134–144)
Total Protein: 7.2 g/dL (ref 6.0–8.5)

## 2019-10-27 ENCOUNTER — Ambulatory Visit (INDEPENDENT_AMBULATORY_CARE_PROVIDER_SITE_OTHER): Payer: Medicare Other | Admitting: *Deleted

## 2019-10-27 DIAGNOSIS — Z0000 Encounter for general adult medical examination without abnormal findings: Secondary | ICD-10-CM | POA: Diagnosis not present

## 2019-10-27 NOTE — Progress Notes (Signed)
MEDICARE ANNUAL WELLNESS VISIT  10/27/2019  Telephone Visit Disclaimer This Medicare AWV was conducted by telephone due to national recommendations for restrictions regarding the COVID-19 Pandemic (e.g. social distancing).  I verified, using two identifiers, that I am speaking with Angela Dawson or their authorized healthcare agent. I discussed the limitations, risks, security, and privacy concerns of performing an evaluation and management service by telephone and the potential availability of an in-person appointment in the future. The patient expressed understanding and agreed to proceed.   Subjective:  Angela Dawson is a 74 y.o. female patient of Bennie Pierini, FNP who had a Medicare Annual Wellness Visit today via telephone. Angela Dawson is Retired and lives alone with her dog. she has 1 child. she reports that she is socially active and does interact with friends/family regularly. she is minimally physically active and enjoys crocheting and sewing.  Patient Care Team: Bennie Pierini, FNP as PCP - General (Family Medicine) Wyline Mood Dorothe Pea, MD as PCP - Cardiology (Cardiology) Delight Ovens, MD as Consulting Physician (Cardiothoracic Surgery)  Advanced Directives 10/27/2019 10/16/2018 05/15/2017 04/13/2016 01/20/2016 01/14/2016 10/11/2015  Does Patient Have a Medical Advance Directive? No No Yes Yes No No No  Type of Advance Directive - Web designer;Living will Healthcare Power of Attorney - - -  Does patient want to make changes to medical advance directive? - - No - Patient declined - - - -  Copy of Healthcare Power of Attorney in Chart? - - Yes Yes - - -  Would patient like information on creating a medical advance directive? No - Patient declined Yes (MAU/Ambulatory/Procedural Areas - Information given) - - - No - patient declined information Yes - Educational materials given    Hospital Utilization Over the Past 12 Months: # of hospitalizations or  ER visits: 0 # of surgeries: 0  Review of Systems    Patient reports that her overall health is unchanged compared to last year.  History obtained from chart review  Patient Reported Readings (BP, Pulse, CBG, Weight, etc) none  Pain Assessment Pain : 0-10 Pain Score: 4  Pain Type: Chronic pain Pain Location: Hip Pain Orientation: Other (Comment) (bilateral) Pain Descriptors / Indicators: Aching Pain Onset: Other (comment) (2 years) Pain Frequency: Intermittent Pain Relieving Factors: rest, meloxicam Effect of Pain on Daily Activities: has to rest a lot  Pain Relieving Factors: rest, meloxicam  Current Medications & Allergies (verified) Allergies as of 10/27/2019      Reactions   Crestor [rosuvastatin]    Dizziness   Lipitor [atorvastatin]    Unable to stand or move   Statins    Leg cramps    Penicillins Rash   Has patient had a PCN reaction causing immediate rash, facial/tongue/throat swelling, SOB or lightheadedness with hypotension: Yes Has patient had a PCN reaction causing severe rash involving mucus membranes or skin necrosis: No Has patient had a PCN reaction that required hospitalization No Has patient had a PCN reaction occurring within the last 10 years: No If all of the above answers are "NO", then may proceed with Cephalosporin use.      Medication List       Accurate as of October 27, 2019  1:43 PM. If you have any questions, ask your nurse or doctor.        acetaminophen 325 MG tablet Commonly known as: TYLENOL Take 2 tablets (650 mg total) by mouth every 6 (six) hours as needed for mild pain.  aspirin EC 81 MG tablet Take 81 mg by mouth daily.   Calcium 600+D3 600-800 MG-UNIT Tabs Generic drug: Calcium Carb-Cholecalciferol Take 1 tablet by mouth daily.   cetirizine 10 MG tablet Commonly known as: ZYRTEC Take 1 tablet (10 mg total) by mouth daily.   lisinopril 10 MG tablet Commonly known as: ZESTRIL Take 1 tablet (10 mg total) by mouth  daily.   meloxicam 15 MG tablet Commonly known as: MOBIC Take 1 tablet (15 mg total) by mouth daily.   metoprolol tartrate 25 MG tablet Commonly known as: LOPRESSOR TAKE 1/2 TABLET (12.5 MG TOTAL) BY MOUTH 2 (TWO) TIMES DAILY.   OSTEO BI-FLEX ONE PER DAY PO Take by mouth.   Red Yeast Rice 600 MG Tabs Take 1 tablet by mouth daily.   Vitamin D 50 MCG (2000 UT) Caps Take 2,000 Units by mouth daily.       History (reviewed): Past Medical History:  Diagnosis Date  . Allergy    seasonal  . Chicken pox   . Complication of anesthesia    difficult to wake up after tubal ligations  . Dyspnea    with exertion  . Heart murmur   . History of bronchitis   . History of kidney stones    "only one"  . Hypertension   . Measles    Past Surgical History:  Procedure Laterality Date  . AORTIC VALVE REPLACEMENT N/A 01/17/2016   Procedure: AORTIC VALVE REPLACEMENT (AVR) USING EDWARDS MAGNA EASE PERICARDIAL BIOPROSTHESIS VALVE;  Surgeon: Delight Ovens, MD;  Location: [facility];  Service: Open Heart Surgery;  Laterality: N/A;  . CARDIAC CATHETERIZATION N/A 09/24/2015   Procedure: Right/Left Heart Cath and Coronary Angiography;  Surgeon: Corky Crafts, MD;  Location: [facility];  Service: Cardiovascular;  Laterality: N/A;  . COLONOSCOPY    . REPLACEMENT ASCENDING AORTA N/A 01/17/2016   Procedure: REPLACEMENT ASCENDING AORTA USING HEMASHIELD PLATINUM WOVEN DOUBLE VELOUR VASCULAR GRAFT;  Surgeon: Delight Ovens, MD;  Location: [facility];  Service: Open Heart Surgery;  Laterality: N/A;  . TEE WITHOUT CARDIOVERSION N/A 01/17/2016   Procedure: TRANSESOPHAGEAL ECHOCARDIOGRAM (TEE);  Surgeon: Delight Ovens, MD;  Location[facility];  Service: Open Heart Surgery;  Laterality: N/A;  . TUBAL LIGATION     Family History  Problem Relation Age of Onset  . Alzheimer's disease Mother   . Heart disease Mother   . Heart attack Father 76  . Post-traumatic stress disorder Brother     Social History   Socioeconomic History  . Marital status: Widowed    Spouse name: Not on file  . Number of children: 1  . Years of education: business school  . Highest education level: Bachelor's degree (e.g., BA, AB, BS)  Occupational History  . Occupation: retired    Associate Professor: [employer]  . Occupation: Retired    Comment: Biochemist, clinical  Tobacco Use  . Smoking status: Former Smoker    Packs/day: 0.50    Years: 5.00    Pack years: 2.50    Types: Cigarettes    Start date: 08/04/1968    Quit date: 10/10/1973    Years since quitting: 46.0  . Smokeless tobacco: Never Used  . Tobacco comment: smoked less than 1 pack every 2 weeks. Havent smoked in atleast 40 years  Vaping Use  . Vaping Use: Never used  Substance and Sexual Activity  . Alcohol use: No    Alcohol/week: 0.0 standard drinks  . Drug use:  No  . Sexual activity: Not Currently  Other Topics Concern  . Not on file  Social History Narrative  . Not on file   Social Determinants of Health   Financial Resource Strain: Low Risk   . Difficulty of Paying Living Expenses: Not hard at all  Food Insecurity: No Food Insecurity  . Worried About Programme researcher, broadcasting/film/video in the Last Year: Never true  . Ran Out of Food in the Last Year: Never true  Transportation Needs: No Transportation Needs  . Lack of Transportation (Medical): No  . Lack of Transportation (Non-Medical): No  Physical Activity: Insufficiently Active  . Days of Exercise per Week: 4 days  . Minutes of Exercise per Session: 20 min  Stress: No Stress Concern Present  . Feeling of Stress : Not at all  Social Connections: Socially Isolated  . Frequency of Communication with Friends and Family: More than three times a week  . Frequency of Social Gatherings with Friends and Family: More than three times a week  . Attends Religious Services: Never  . Active Member of Clubs or Organizations: No  . Attends Banker Meetings: Never  . Marital  Status: Widowed    Activities of Daily Living In your present state of health, do you have any difficulty performing the following activities: 10/27/2019  Hearing? N  Vision? N  Comment wears rx glasses-needs to have eye exam-last one was 4 years ago per pt  Difficulty concentrating or making decisions? N  Walking or climbing stairs? Y  Comment due to her hip pain  Dressing or bathing? N  Doing errands, shopping? N  Preparing Food and eating ? N  Using the Toilet? N  In the past six months, have you accidently leaked urine? Y  Comment in the morning when she wakes up-can't make it to the bathroom on time  Do you have problems with loss of bowel control? N  Managing your Medications? N  Managing your Finances? N  Housekeeping or managing your Housekeeping? N  Some recent data might be hidden    Patient Education/ Literacy How often do you need to have someone help you when you read instructions, pamphlets, or other written materials from your doctor or pharmacy?: 1 - Never What is the last grade level you completed in school?: Bachelors Degree  Exercise Current Exercise Habits: Home exercise routine, Type of exercise: stretching, Time (Minutes): 20, Frequency (Times/Week): 4, Weekly Exercise (Minutes/Week): 80, Intensity: Mild, Exercise limited by: cardiac condition(s);orthopedic condition(s)  Diet Patient reports consuming 3 meals a day and 1 snack(s) a day Patient reports that her primary diet is: Regular Patient reports that she does have regular access to food.   Depression Screen PHQ 2/9 Scores 10/27/2019 10/03/2019 05/30/2019 04/04/2019 10/16/2018 10/01/2018 11/26/2017  PHQ - 2 Score 0 0 0 0 0 0 0  PHQ- 9 Score - - - - - - -     Fall Risk Fall Risk  10/27/2019 10/03/2019 05/30/2019 04/04/2019 10/16/2018  Falls in the past year? 0 0 0 0 0  Number falls in past yr: - - - - 0  Injury with Fall? - - - - 0  Comment - - - - -     Objective:  Angela Dawson seemed alert and  oriented and she participated appropriately during our telephone visit.  Blood Pressure Weight BMI  BP Readings from Last 3 Encounters:  10/03/19 (!) 130/77  08/13/19 138/82  05/30/19 (!) 141/72   Wt Readings  from Last 3 Encounters:  10/03/19 (!) 215 lb (97.5 kg)  08/13/19 221 lb 9.6 oz (100.5 kg)  05/30/19 210 lb (95.3 kg)   BMI Readings from Last 1 Encounters:  10/03/19 43.42 kg/m    *Unable to obtain current vital signs, weight, and BMI due to telephone visit type  Hearing/Vision  . [name] did not seem to have difficulty with hearing/understanding during the telephone conversation . Reports that she has not had a formal eye exam by an eye care professional within the past year . Reports that she has not had a formal hearing evaluation within the past year *Unable to fully assess hearing and vision during telephone visit type  Cognitive Function: 6CIT Screen 10/27/2019 10/16/2018  What Year? 0 points 0 points  What month? 0 points 0 points  What time? 0 points 0 points  Count back from 20 0 points 0 points  Months in reverse 2 points 0 points  Repeat phrase 0 points 0 points  Total Score 2 0   (Normal:0-7, Significant for Dysfunction: >8)  Normal Cognitive Function Screening: Yes   Immunization & Health Maintenance Record Immunization History  Administered Date(s) Administered  . Fluad Quad(high Dose 65+) 12/02/2018  . Influenza, High Dose Seasonal PF 12/01/2015, 03/20/2017, 01/24/2018  . Moderna SARS-COVID-2 Vaccination 04/15/2019, 05/13/2019  . Pneumococcal Conjugate-13 10/11/2015  . Pneumococcal Polysaccharide-23 03/20/2017    Health Maintenance  Topic Date Due  . TETANUS/TDAP  07/17/2017  . INFLUENZA VACCINE  10/05/2019  . MAMMOGRAM  04/14/2021  . COLONOSCOPY  11/10/2021  . DEXA SCAN  04/02/2022  . COVID-19 Vaccine  Completed  . Hepatitis C Screening  Completed  . PNA vac Low Risk Adult  Completed       Assessment  This is a routine wellness  examination for Angela MayoLinda M Dawson.  Health Maintenance: Due or Overdue Health Maintenance Due  Topic Date Due  . TETANUS/TDAP  07/17/2017  . INFLUENZA VACCINE  10/05/2019    Angela Dawson does not need a referral for Community Assistance: Care Management:   no Social Work:    no Prescription Assistance:  no Nutrition/Diabetes Education:  no   Plan:  Personalized Goals Goals Addressed            This Visit's Progress   . DIET - INCREASE WATER INTAKE       Try to drink 6-8 glasses of water daily      Personalized Health Maintenance & Screening Recommendations  Influenza vaccine Td vaccine Shingrix vaccine  Lung Cancer Screening Recommended: no (Low Dose CT Chest recommended if Age 22-80 years, 30 pack-year currently smoking OR have quit w/in past 15 years) Hepatitis C Screening recommended: no HIV Screening recommended: no  Advanced Directives: Written information was not prepared per patient's request.  Referrals & Orders No orders of the defined types were placed in this encounter.   Follow-up Plan . Follow-up with Bennie PieriniMartin, Mary-Margaret, FNP as planned . Consider Flu, TDAP and Shingrix vaccines at your next visit with your PCP   I have personally reviewed and noted the following in the patient's chart:   . Medical and social history . Use of alcohol, tobacco or illicit drugs  . Current medications and supplements . Functional ability and status . Nutritional status . Physical activity . Advanced directives . List of other physicians . Hospitalizations, surgeries, and ER visits in previous 12 months . Vitals . Screenings to include cognitive, depression, and falls . Referrals and appointments  In addition, I have  reviewed and discussed with Angela Dawson certain preventive protocols, quality metrics, and best practice recommendations. A written personalized care plan for preventive services as well as general preventive health recommendations is available  and can be mailed to the patient at her request.      Hessie Diener, LPN  2/82/0813

## 2019-10-27 NOTE — Patient Instructions (Signed)
Preventive Care 73 Years and Older, Female Preventive care refers to lifestyle choices and visits with your health care provider that can promote health and wellness. This includes:  A yearly physical exam. This is also called an annual well check.  Regular dental and eye exams.  Immunizations.  Screening for certain conditions.  Healthy lifestyle choices, such as diet and exercise. What can I expect for my preventive care visit? Physical exam Your health care provider will check:  Height and weight. These may be used to calculate body mass index (BMI), which is a measurement that tells if you are at a healthy weight.  Heart rate and blood pressure.  Your skin for abnormal spots. Counseling Your health care provider may ask you questions about:  Alcohol, tobacco, and drug use.  Emotional well-being.  Home and relationship well-being.  Sexual activity.  Eating habits.  History of falls.  Memory and ability to understand (cognition).  Work and work Statistician.  Pregnancy and menstrual history. What immunizations do I need?  Influenza (flu) vaccine  This is recommended every year. Tetanus, diphtheria, and pertussis (Tdap) vaccine  You may need a Td booster every 10 years. Varicella (chickenpox) vaccine  You may need this vaccine if you have not already been vaccinated. Zoster (shingles) vaccine  You may need this after age 73. Pneumococcal conjugate (PCV13) vaccine  One dose is recommended after age 73. Pneumococcal polysaccharide (PPSV23) vaccine  One dose is recommended after age 73. Measles, mumps, and rubella (MMR) vaccine  You may need at least one dose of MMR if you were born in 1957 or later. You may also need a second dose. Meningococcal conjugate (MenACWY) vaccine  You may need this if you have certain conditions. Hepatitis A vaccine  You may need this if you have certain conditions or if you travel or work in places where you may be exposed  to hepatitis A. Hepatitis B vaccine  You may need this if you have certain conditions or if you travel or work in places where you may be exposed to hepatitis B. Haemophilus influenzae type b (Hib) vaccine  You may need this if you have certain conditions. You may receive vaccines as individual doses or as more than one vaccine together in one shot (combination vaccines). Talk with your health care provider about the risks and benefits of combination vaccines. What tests do I need? Blood tests  Lipid and cholesterol levels. These may be checked every 5 years, or more frequently depending on your overall health.  Hepatitis C test.  Hepatitis B test. Screening  Lung cancer screening. You may have this screening every year starting at age 73 if you have a 30-pack-year history of smoking and currently smoke or have quit within the past 15 years.  Colorectal cancer screening. All adults should have this screening starting at age 73 and continuing until age 4. Your health care provider may recommend screening at age 73 if you are at increased risk. You will have tests every 1-10 years, depending on your results and the type of screening test.  Diabetes screening. This is done by checking your blood sugar (glucose) after you have not eaten for a while (fasting). You may have this done every 1-3 years.  Mammogram. This may be done every 1-2 years. Talk with your health care provider about how often you should have regular mammograms.  BRCA-related cancer screening. This may be done if you have a family history of breast, ovarian, tubal, or peritoneal cancers.  Other tests °· Sexually transmitted disease (STD) testing. °· Bone density scan. This is done to screen for osteoporosis. You may have this done starting at age 73 °Follow these instructions at home: °Eating and drinking °· Eat a diet that includes fresh fruits and vegetables, whole grains, lean protein, and low-fat dairy products. Limit  your intake of foods with high amounts of sugar, saturated fats, and salt. °· Take vitamin and mineral supplements as recommended by your health care provider. °· Do not drink alcohol if your health care provider tells you not to drink. °· If you drink alcohol: °? Limit how much you have to 0-1 drink a day. °? Be aware of how much alcohol is in your drink. In the [place] one drink equals one 12 oz bottle of beer (355 mL), one 5 oz glass of wine (148 mL), or one 1½ oz glass of hard liquor (44 mL). °Lifestyle °· Take daily care of your teeth and gums. °· Stay active. Exercise for at least 30 minutes on 5 or more days each week. °· Do not use any products that contain nicotine or tobacco, such as cigarettes, e-cigarettes, and chewing tobacco. If you need help quitting, ask your health care provider. °· If you are sexually active, practice safe sex. Use a condom or other form of protection in order to prevent STIs (sexually transmitted infections). °· Talk with your health care provider about taking a low-dose aspirin or statin. °What's next? °· Go to your health care provider once a year for a well check visit. °· Ask your health care provider how often you should have your eyes and teeth checked. °· Stay up to date on all vaccines. °This information is not intended to replace advice given to you by your health care provider. Make sure you discuss any questions you have with your health care provider. °Document Revised: 02/14/2018 Document Reviewed: 02/14/2018 °Elsevier Patient Education © 2020 [employer] ° °

## 2020-01-21 DIAGNOSIS — Z23 Encounter for immunization: Secondary | ICD-10-CM | POA: Diagnosis not present

## 2020-02-05 ENCOUNTER — Ambulatory Visit (INDEPENDENT_AMBULATORY_CARE_PROVIDER_SITE_OTHER): Payer: Medicare Other

## 2020-02-05 ENCOUNTER — Other Ambulatory Visit: Payer: Self-pay

## 2020-02-05 DIAGNOSIS — Z23 Encounter for immunization: Secondary | ICD-10-CM

## 2020-02-09 ENCOUNTER — Other Ambulatory Visit: Payer: Self-pay | Admitting: Nurse Practitioner

## 2020-02-12 ENCOUNTER — Ambulatory Visit: Payer: Medicare Other

## 2020-03-05 ENCOUNTER — Other Ambulatory Visit: Payer: Self-pay | Admitting: Nurse Practitioner

## 2020-03-05 DIAGNOSIS — I10 Essential (primary) hypertension: Secondary | ICD-10-CM

## 2020-03-08 NOTE — Telephone Encounter (Signed)
MMM NTBS 6 mos ckup to be Jan. Mail order sent

## 2020-04-08 ENCOUNTER — Other Ambulatory Visit: Payer: Self-pay | Admitting: Nurse Practitioner

## 2020-04-08 DIAGNOSIS — Z952 Presence of prosthetic heart valve: Secondary | ICD-10-CM

## 2020-04-27 ENCOUNTER — Encounter: Payer: Self-pay | Admitting: Nurse Practitioner

## 2020-04-27 ENCOUNTER — Other Ambulatory Visit: Payer: Self-pay

## 2020-04-27 ENCOUNTER — Ambulatory Visit (INDEPENDENT_AMBULATORY_CARE_PROVIDER_SITE_OTHER): Payer: Medicare Other | Admitting: Nurse Practitioner

## 2020-04-27 VITALS — BP 145/69 | HR 63 | Temp 96.1°F | Resp 20 | Ht 59.0 in | Wt 218.0 lb

## 2020-04-27 DIAGNOSIS — I10 Essential (primary) hypertension: Secondary | ICD-10-CM | POA: Diagnosis not present

## 2020-04-27 DIAGNOSIS — E782 Mixed hyperlipidemia: Secondary | ICD-10-CM | POA: Diagnosis not present

## 2020-04-27 DIAGNOSIS — Z952 Presence of prosthetic heart valve: Secondary | ICD-10-CM | POA: Diagnosis not present

## 2020-04-27 DIAGNOSIS — M160 Bilateral primary osteoarthritis of hip: Secondary | ICD-10-CM | POA: Diagnosis not present

## 2020-04-27 MED ORDER — METOPROLOL TARTRATE 25 MG PO TABS: 12.5000 mg | ORAL_TABLET | Freq: Two times a day (BID) | ORAL | 1 refills | Status: DC

## 2020-04-27 MED ORDER — LISINOPRIL 10 MG PO TABS: 10.0000 mg | ORAL_TABLET | Freq: Every day | ORAL | 1 refills | Status: DC

## 2020-04-27 NOTE — Progress Notes (Addendum)
Subjective:    Patient ID: Angela Dawson, female    DOB: 1945/05/31, 75 y.o.   MRN: [id number]   Chief Complaint: Medical Management of Chronic Issues    HPI:  1. Primary hypertension Tolerating lisinopril and metoprolol well. Tries to follow a low sodium diet. Tries to eat fresh vegetable when she can. Has a garden in the summer.  BP Readings from Last 3 Encounters:  04/27/20 (!) 145/69  10/03/19 (!) 130/77  08/13/19 138/82    2. Mixed hyperlipidemia  Cannot tolerate a statin. Takes red yeast rice for control. Low fat diet with lots of vegetables. Dr. Harl Bowie told her as long as did  Not worsen we would just watch it. They wanted to try repatha but patient said was to expensive.  Lab Results  Component Value Date   CHOL 226 (H) 10/03/2019   HDL 44 10/03/2019   LDLCALC 157 (H) 10/03/2019   TRIG 137 10/03/2019   CHOLHDL 5.1 (H) 10/03/2019     3. Severe obesity (BMI >= 40) (HCC) Recently adopted a dog who she walks daily. Tries to eat low fat/low calorie.   4. Osteoarthritis of both hips, unspecified Meloxicam did not work well for her. Caused dizziness and heart palpitations. Pain increases with prolonged standing. Takes tylenol arthritis helps "a little."     Outpatient Encounter Medications as of 04/27/2020  Medication Sig  . acetaminophen (TYLENOL) 325 MG tablet Take 2 tablets (650 mg total) by mouth every 6 (six) hours as needed for mild pain.  Marland Kitchen aspirin EC 81 MG tablet Take 81 mg by mouth daily.  . [place]wellia-Glucosamine-Vit D (OSTEO BI-FLEX ONE PER DAY PO) Take by mouth.  . Calcium Carb-Cholecalciferol 600-800 MG-UNIT TABS Take 1 tablet by mouth daily.   . cetirizine (ZYRTEC) 10 MG tablet TAKE 1 TABLET (10 MG TOTAL) BY MOUTH DAILY.  Marland Kitchen Cholecalciferol (VITAMIN D) 2000 units CAPS Take 2,000 Units by mouth daily.  Marland Kitchen lisinopril (ZESTRIL) 10 MG tablet TAKE 1 TABLET (10 MG TOTAL) BY MOUTH DAILY.  . metoprolol tartrate (LOPRESSOR) 25 MG tablet Take 0.5 tablets (12.5 mg  total) by mouth 2 (two) times daily. NEEDS TO BE SEEN FOR FURTHER REFILLS  . Red Yeast Rice 600 MG TABS Take 1 tablet by mouth daily.   . [DISCONTINUED] meloxicam (MOBIC) 15 MG tablet Take 1 tablet (15 mg total) by mouth daily.   No facility-administered encounter medications on file as of 04/27/2020.    Past Surgical History:  Procedure Laterality Date  . AORTIC VALVE REPLACEMENT N/A 01/17/2016   Procedure: AORTIC VALVE REPLACEMENT (AVR) USING 21MM EDWARDS MAGNA EASE PERICARDIAL BIOPROSTHESIS VALVE;  Surgeon: Grace Isaac, MD;  Location: [facility];  Service: Open Heart Surgery;  Laterality: N/A;  . CARDIAC CATHETERIZATION N/A 09/24/2015   Procedure: Right/Left Heart Cath and Coronary Angiography;  Surgeon: Jettie Booze, MD;  Location: [facility];  Service: Cardiovascular;  Laterality: N/A;  . COLONOSCOPY    . REPLACEMENT ASCENDING AORTA N/A 01/17/2016   Procedure: REPLACEMENT ASCENDING AORTA USING HEMASHIELD PLATINUM 28MM WOVEN DOUBLE VELOUR VASCULAR GRAFT;  Surgeon: Grace Isaac, MD;  Location: [facility];  Service: Open Heart Surgery;  Laterality: N/A;  . TEE WITHOUT CARDIOVERSION N/A 01/17/2016   Procedure: TRANSESOPHAGEAL ECHOCARDIOGRAM (TEE);  Surgeon: Grace Isaac, MD;  Location: [facility];  Service: Open Heart Surgery;  Laterality: N/A;  . TUBAL LIGATION      Family History  Problem Relation Age of Onset  . Alzheimer's disease Mother   .  Heart disease Mother   . Heart attack Father 43  . Post-traumatic stress disorder Brother     New complaints: None.   Social history: Lives by herself and with her dog "Ginger."   Controlled substance contract: N/A     Review of Systems  Constitutional: Negative for chills, fatigue and fever.  Respiratory: Negative for cough, shortness of breath and wheezing.   Cardiovascular: Negative for chest pain, palpitations and leg swelling.  Gastrointestinal: Negative for abdominal distention, abdominal pain, constipation and  diarrhea.  Genitourinary: Negative for difficulty urinating and dysuria.  Musculoskeletal: Positive for arthralgias.       Bilateral hips.  Neurological: Negative for dizziness, light-headedness, numbness and headaches.       Objective:   Physical Exam Constitutional:      Appearance: She is obese.  Cardiovascular:     Rate and Rhythm: Normal rate and regular rhythm.     Pulses: Normal pulses.     Heart sounds: Normal heart sounds.  Pulmonary:     Effort: Pulmonary effort is normal.     Breath sounds: Normal breath sounds.  Abdominal:     General: Bowel sounds are normal.  Musculoskeletal:     Cervical back: Normal range of motion and neck supple.  Skin:    General: Skin is warm and dry.     Capillary Refill: Capillary refill takes less than 2 seconds.  Neurological:     General: No focal deficit present.     Mental Status: She is alert and oriented to person, place, and time. Mental status is at baseline.  Psychiatric:        Mood and Affect: Mood normal.        Behavior: Behavior normal.        Thought Content: Thought content normal.        Judgment: Judgment normal.        Assessment & Plan:   Angela Dawson comes in today with chief complaint of Medical Management of Chronic Issues   Diagnosis and orders addressed:  1. Primary hypertension Continue medications as prescribed. Low sodium diet and exercise daily.   - CBC with Differential/Platelet - CMP14+EGFR  2. Mixed hyperlipidemia Repatha too costly for her. Per her cardiologist, ok for lipids to run high as long as they dont continue to elevate. - Lipid panel  3. Severe obesity (BMI >= 40) (HCC) Continue low fat, low calorie, low sodium diet. Encouraged regular exercise. She does walk her dog daily.   4. Osteoarthritis of both hips, unspecified osteoarthritis type Continue tylenol PRN  5. Essential hypertension Continue medications as prescribed.  - lisinopril (ZESTRIL) 10 MG tablet; Take 1 tablet  (10 mg total) by mouth daily.  Dispense: 90 tablet; Refill: 1  6. S/P AVR (aortic valve replacement) Follows with cardiology. Continue to manage HTN.  - metoprolol tartrate (LOPRESSOR) 25 MG tablet; Take 0.5 tablets (12.5 mg total) by mouth 2 (two) times daily. NEEDS TO BE SEEN FOR FURTHER REFILLS  Dispense: 90 tablet; Refill: 1   Labs pending Health Maintenance reviewed Diet and exercise encouraged  Follow up plan: 3 month follow up  Dollene Primrose, RN, BSN, FNP-Student  Mary-Margaret Hassell Done, FNP

## 2020-04-27 NOTE — Patient Instructions (Signed)

## 2020-04-28 LAB — CBC WITH DIFFERENTIAL/PLATELET
Basophils Absolute: 0.1 10*3/uL (ref 0.0–0.2)
Basos: 1 %
EOS (ABSOLUTE): 0.2 10*3/uL (ref 0.0–0.4)
Eos: 3 %
Hematocrit: 42.5 % (ref 34.0–46.6)
Hemoglobin: 14.2 g/dL (ref 11.1–15.9)
Immature Grans (Abs): 0 10*3/uL (ref 0.0–0.1)
Immature Granulocytes: 0 %
Lymphocytes Absolute: 3.8 10*3/uL — ABNORMAL HIGH (ref 0.7–3.1)
Lymphs: 45 %
MCH: 32.1 pg (ref 26.6–33.0)
MCHC: 33.4 g/dL (ref 31.5–35.7)
MCV: 96 fL (ref 79–97)
Monocytes Absolute: 0.5 10*3/uL (ref 0.1–0.9)
Monocytes: 7 %
Neutrophils Absolute: 3.7 10*3/uL (ref 1.4–7.0)
Neutrophils: 44 %
Platelets: 285 10*3/uL (ref 150–450)
RBC: 4.43 x10E6/uL (ref 3.77–5.28)
RDW: 12.4 % (ref 11.7–15.4)
WBC: 8.4 10*3/uL (ref 3.4–10.8)

## 2020-04-28 LAB — CMP14+EGFR
ALT: 17 IU/L (ref 0–32)
AST: 22 IU/L (ref 0–40)
Albumin/Globulin Ratio: 1.5 (ref 1.2–2.2)
Albumin: 4.2 g/dL (ref 3.7–4.7)
Alkaline Phosphatase: 71 IU/L (ref 44–121)
BUN/Creatinine Ratio: 19 (ref 12–28)
BUN: 16 mg/dL (ref 8–27)
Bilirubin Total: 0.3 mg/dL (ref 0.0–1.2)
CO2: 24 mmol/L (ref 20–29)
Calcium: 9.5 mg/dL (ref 8.7–10.3)
Chloride: 104 mmol/L (ref 96–106)
Creatinine, Ser: 0.86 mg/dL (ref 0.57–1.00)
GFR calc Af Amer: 78 mL/min/{1.73_m2} (ref >59)
GFR calc non Af Amer: 67 mL/min/{1.73_m2} (ref >59)
Globulin, Total: 2.8 g/dL (ref 1.5–4.5)
Glucose: 97 mg/dL (ref 65–99)
Potassium: 4.5 mmol/L (ref 3.5–5.2)
Sodium: 144 mmol/L (ref 134–144)
Total Protein: 7 g/dL (ref 6.0–8.5)

## 2020-04-28 LAB — LIPID PANEL
Chol/HDL Ratio: 4.6 ratio — ABNORMAL HIGH (ref 0.0–4.4)
Cholesterol, Total: 221 mg/dL — ABNORMAL HIGH (ref 100–199)
HDL: 48 mg/dL (ref >39)
LDL Chol Calc (NIH): 143 mg/dL — ABNORMAL HIGH (ref 0–99)
Triglycerides: 169 mg/dL — ABNORMAL HIGH (ref 0–149)
VLDL Cholesterol Cal: 30 mg/dL (ref 5–40)

## 2020-06-24 ENCOUNTER — Other Ambulatory Visit: Payer: Self-pay | Admitting: Nurse Practitioner

## 2020-07-29 ENCOUNTER — Ambulatory Visit (INDEPENDENT_AMBULATORY_CARE_PROVIDER_SITE_OTHER): Payer: Medicare Other | Admitting: Family Medicine

## 2020-07-29 ENCOUNTER — Encounter: Payer: Self-pay | Admitting: Family Medicine

## 2020-07-29 DIAGNOSIS — U071 COVID-19: Secondary | ICD-10-CM | POA: Diagnosis not present

## 2020-07-29 MED ORDER — GUAIFENESIN ER 600 MG PO TB12
600.0000 mg | ORAL_TABLET | Freq: Two times a day (BID) | ORAL | 0 refills | Status: DC | PRN
Start: 2020-07-29 — End: 2020-12-27

## 2020-07-29 MED ORDER — FLUTICASONE PROPIONATE 50 MCG/ACT NA SUSP: 2.0000 | Freq: Every day | NASAL | 6 refills | Status: DC

## 2020-07-29 MED ORDER — PREDNISONE 20 MG PO TABS: 40.0000 mg | ORAL_TABLET | Freq: Every day | ORAL | 0 refills | Status: AC

## 2020-07-29 NOTE — Progress Notes (Signed)
Virtual Visit  Note Due to COVID-19 pandemic this visit was conducted virtually. This visit type was conducted due to national recommendations for restrictions regarding the COVID-19 Pandemic (e.g. social distancing, sheltering in place) in an effort to limit this patient's exposure and mitigate transmission in our community. All issues noted in this document were discussed and addressed.  A physical exam was not performed with this format.  I connected with Angela Dawson on 07/29/20 at 1049 by telephone and verified that I am speaking with the correct person using two identifiers. Angela Dawson is currently located at home and on one is currently with her during the visit. The provider, Gabriel Earing, FNP is located in their office at time of visit.  I discussed the limitations, risks, security and privacy concerns of performing an evaluation and management service by telephone and the availability of in person appointments. I also discussed with the patient that there may be a patient responsible charge related to this service. The patient expressed understanding and agreed to proceed.  CC: congestion  History and Present Illness:  HPI  Angela Dawson reports congestion, postnasal drip, and cough with white phlegm x 8 days. She has also had some shortness of breath with exertion such as walking the dogs. She is not having shortness of breath at rest or walking short distances. She denies fever, chest pain, fatigue, nausea, vomiting, diarrhea, or sore throat. Denies wheezing. She had a positive home Covid test last night. She decided to take a test after her son tested positive. She has been eating well and staying well hydrated. She has been taking zyrtec and mucinex with some relief. Overall she feels like she is improving.     ROS As per HPI.   Observations/Objective: Alert and oriented x 3. Able to speak in full sentences without difficulty.    Assessment and Plan: Angela Dawson was seen today  for nasal congestion.  Diagnoses and all orders for this visit:  COVID Positive home test. Symptoms x 8 days. Continue quarantine until respiratory symptoms improve and until day 10. Mucinex for congestion, flonase for postnasal drip. Prednisone burst given. Return to office for new or worsening symptoms, or if symptoms persist.  -     fluticasone (FLONASE) 50 MCG/ACT nasal spray; Place 2 sprays into both nostrils daily. -     guaiFENesin (MUCINEX) 600 MG 12 hr tablet; Take 1-2 tablets (600-1,200 mg total) by mouth 2 (two) times daily as needed for cough or to loosen phlegm. -     predniSONE (DELTASONE) 20 MG tablet; Take 2 tablets (40 mg total) by mouth daily with breakfast for 3 days.     Follow Up Instructions: As needed.     I discussed the assessment and treatment plan with the patient. The patient was provided an opportunity to ask questions and all were answered. The patient agreed with the plan and demonstrated an understanding of the instructions.   The patient was advised to call back or seek an in-person evaluation if the symptoms worsen or if the condition fails to improve as anticipated.  The above assessment and management plan was discussed with the patient. The patient verbalized understanding of and has agreed to the management plan. Patient is aware to call the clinic if symptoms persist or worsen. Patient is aware when to return to the clinic for a follow-up visit. Patient educated on when it is appropriate to go to the emergency department.   Time call ended:  1100  I provided 11 minutes of  non face-to-face time during this encounter.    Gabriel Earing, FNP

## 2020-08-20 ENCOUNTER — Other Ambulatory Visit: Payer: Self-pay | Admitting: *Deleted

## 2020-08-20 MED ORDER — ASPIRIN EC 81 MG PO TBEC: 81.0000 mg | DELAYED_RELEASE_TABLET | Freq: Every day | ORAL | 3 refills | Status: DC

## 2020-09-30 ENCOUNTER — Other Ambulatory Visit: Payer: Self-pay | Admitting: Nurse Practitioner

## 2020-09-30 DIAGNOSIS — I10 Essential (primary) hypertension: Secondary | ICD-10-CM

## 2020-10-29 ENCOUNTER — Ambulatory Visit: Payer: Medicare Other | Admitting: Nurse Practitioner

## 2020-11-04 ENCOUNTER — Ambulatory Visit (INDEPENDENT_AMBULATORY_CARE_PROVIDER_SITE_OTHER): Payer: Medicare Other

## 2020-11-04 VITALS — Ht 59.0 in | Wt 218.0 lb

## 2020-11-04 DIAGNOSIS — Z0000 Encounter for general adult medical examination without abnormal findings: Secondary | ICD-10-CM | POA: Diagnosis not present

## 2020-11-04 NOTE — Patient Instructions (Signed)
Angela Dawson , Thank you for taking time to come for your Medicare Wellness Visit. I appreciate your ongoing commitment to your health goals. Please review the following plan we discussed and let me know if I can assist you in the future.   Screening recommendations/referrals: Colonoscopy: 11/11/2011, no longer required Mammogram: 04/15/19, repeat every year Bone Density: 04/02/2017, repeat every 5 years Recommended yearly ophthalmology/optometry visit for glaucoma screening and checkup Recommended yearly dental visit for hygiene and checkup  Vaccinations: Influenza vaccine: 02/05/2020, due in the fall Pneumococcal vaccine: 10/11/2015 03/20/2017 Tdap vaccine: Declined due to cost Shingles vaccine: Shingrix discussed. Please contact your pharmacy for coverage information.     Covid-19:01/21/20, 05/13/19, 04/15/19  Advanced directives: Please bring a copy of your health care power of attorney and living will to the office to be added to your chart at your convenience.  Conditions/risks identified: Aim for 30 minutes of exercise each day, drink 6-8 glasses of water and eat lots of fruits and vegetables.   Next appointment: Follow up in one year for your annual wellness visit    Preventive Care 65 Years and Older, Female Preventive care refers to lifestyle choices and visits with your health care provider that can promote health and wellness. What does preventive care include? A yearly physical exam. This is also called an annual well check. Dental exams once or twice a year. Routine eye exams. Ask your health care provider how often you should have your eyes checked. Personal lifestyle choices, including: Daily care of your teeth and gums. Regular physical activity. Eating a healthy diet. Avoiding tobacco and drug use. Limiting alcohol use. Practicing safe sex. Taking low-dose aspirin every day. Taking vitamin and mineral supplements as recommended by your health care provider. What happens  during an annual well check? The services and screenings done by your health care provider during your annual well check will depend on your age, overall health, lifestyle risk factors, and family history of disease. Counseling  Your health care provider may ask you questions about your: Alcohol use. Tobacco use. Drug use. Emotional well-being. Home and relationship well-being. Sexual activity. Eating habits. History of falls. Memory and ability to understand (cognition). Work and work Astronomer. Reproductive health. Screening  You may have the following tests or measurements: Height, weight, and BMI. Blood pressure. Lipid and cholesterol levels. These may be checked every 5 years, or more frequently if you are over 8 years old. Skin check. Lung cancer screening. You may have this screening every year starting at age 65 if you have a 30-pack-year history of smoking and currently smoke or have quit within the past 15 years. Fecal occult blood test (FOBT) of the stool. You may have this test every year starting at age 30. Flexible sigmoidoscopy or colonoscopy. You may have a sigmoidoscopy every 5 years or a colonoscopy every 10 years starting at age 49. Hepatitis C blood test. Hepatitis B blood test. Sexually transmitted disease (STD) testing. Diabetes screening. This is done by checking your blood sugar (glucose) after you have not eaten for a while (fasting). You may have this done every 1-3 years. Bone density scan. This is done to screen for osteoporosis. You may have this done starting at age 62. Mammogram. This may be done every 1-2 years. Talk to your health care provider about how often you should have regular mammograms. Talk with your health care provider about your test results, treatment options, and if necessary, the need for more tests. Vaccines  Your health care  provider may recommend certain vaccines, such as: Influenza vaccine. This is recommended every  year. Tetanus, diphtheria, and acellular pertussis (Tdap, Td) vaccine. You may need a Td booster every 10 years. Zoster vaccine. You may need this after age 48. Pneumococcal 13-valent conjugate (PCV13) vaccine. One dose is recommended after age 23. Pneumococcal polysaccharide (PPSV23) vaccine. One dose is recommended after age 65. Talk to your health care provider about which screenings and vaccines you need and how often you need them. This information is not intended to replace advice given to you by your health care provider. Make sure you discuss any questions you have with your health care provider. Document Released: 03/19/2015 Document Revised: 11/10/2015 Document Reviewed: 12/22/2014 Elsevier Interactive Patient Education  2017 [place] Prevention in the Home Falls can cause injuries. They can happen to people of all ages. There are many things you can do to make your home safe and to help prevent falls. What can I do on the outside of my home? Regularly fix the edges of walkways and driveways and fix any cracks. Remove anything that might make you trip as you walk through a door, such as a raised step or threshold. Trim any bushes or trees on the path to your home. Use bright outdoor lighting. Clear any walking paths of anything that might make someone trip, such as rocks or tools. Regularly check to see if handrails are loose or broken. Make sure that both sides of any steps have handrails. Any raised decks and porches should have guardrails on the edges. Have any leaves, snow, or ice cleared regularly. Use sand or salt on walking paths during winter. Clean up any spills in your garage right away. This includes oil or grease spills. What can I do in the bathroom? Use night lights. Install grab bars by the toilet and in the tub and shower. Do not use towel bars as grab bars. Use non-skid mats or decals in the tub or shower. If you need to sit down in the shower, use a  plastic, non-slip stool. Keep the floor dry. Clean up any water that spills on the floor as soon as it happens. Remove soap buildup in the tub or shower regularly. Attach bath mats securely with double-sided non-slip rug tape. Do not have throw rugs and other things on the floor that can make you trip. What can I do in the bedroom? Use night lights. Make sure that you have a light by your bed that is easy to reach. Do not use any sheets or blankets that are too big for your bed. They should not hang down onto the floor. Have a firm chair that has side arms. You can use this for support while you get dressed. Do not have throw rugs and other things on the floor that can make you trip. What can I do in the kitchen? Clean up any spills right away. Avoid walking on wet floors. Keep items that you use a lot in easy-to-reach places. If you need to reach something above you, use a strong step stool that has a grab bar. Keep electrical cords out of the way. Do not use floor polish or wax that makes floors slippery. If you must use wax, use non-skid floor wax. Do not have throw rugs and other things on the floor that can make you trip. What can I do with my stairs? Do not leave any items on the stairs. Make sure that there are handrails on both  sides of the stairs and use them. Fix handrails that are broken or loose. Make sure that handrails are as long as the stairways. Check any carpeting to make sure that it is firmly attached to the stairs. Fix any carpet that is loose or worn. Avoid having throw rugs at the top or bottom of the stairs. If you do have throw rugs, attach them to the floor with carpet tape. Make sure that you have a light switch at the top of the stairs and the bottom of the stairs. If you do not have them, ask someone to add them for you. What else can I do to help prevent falls? Wear shoes that: Do not have high heels. Have rubber bottoms. Are comfortable and fit you  well. Are closed at the toe. Do not wear sandals. If you use a stepladder: Make sure that it is fully opened. Do not climb a closed stepladder. Make sure that both sides of the stepladder are locked into place. Ask someone to hold it for you, if possible. Clearly mark and make sure that you can see: Any grab bars or handrails. First and last steps. Where the edge of each step is. Use tools that help you move around (mobility aids) if they are needed. These include: Canes. Walkers. Scooters. Crutches. Turn on the lights when you go into a dark area. Replace any light bulbs as soon as they burn out. Set up your furniture so you have a clear path. Avoid moving your furniture around. If any of your floors are uneven, fix them. If there are any pets around you, be aware of where they are. Review your medicines with your doctor. Some medicines can make you feel dizzy. This can increase your chance of falling. Ask your doctor what other things that you can do to help prevent falls. This information is not intended to replace advice given to you by your health care provider. Make sure you discuss any questions you have with your health care provider. Document Released: 12/17/2008 Document Revised: 07/29/2015 Document Reviewed: 03/27/2014 Elsevier Interactive Patient Education  2017 [employer].

## 2020-11-04 NOTE — Progress Notes (Addendum)
Subjective:   Angela Dawson is a 75 y.o. female who presents for Medicare Annual (Subsequent) preventive examination. Virtual Visit via Telephone Note  I connected with  Angela Dawson on 11/04/20 at  3:30 PM EDT by telephone and verified that I am speaking with the correct person using two identifiers.  Location: Patient: Home Provider: [facility] Persons participating in the virtual visit: patient/Nurse Health Advisor   I discussed the limitations, risks, security and privacy concerns of performing an evaluation and management service by telephone and the availability of in person appointments. The patient expressed understanding and agreed to proceed.  Interactive audio and video telecommunications were attempted between this nurse and patient, however failed, due to patient having technical difficulties OR patient did not have access to video capability.  We continued and completed visit with audio only.  Some vital signs may be absent or patient reported.   Darral Dash, LPN  Review of Systems     Cardiac Risk Factors include: advanced age (>63men, >5 women);dyslipidemia;hypertension;obesity (BMI >30kg/m2);sedentary lifestyle     Objective:    Today's Vitals   11/04/20 1548 11/04/20 1551  Weight: 218 lb (98.9 kg)   Height:  (1.499 m)   PainSc:  5    Body mass index is 44.03 kg/m.  Advanced Directives 11/04/2020 10/27/2019 10/16/2018 05/15/2017 04/13/2016 01/20/2016 01/14/2016  Does Patient Have a Medical Advance Directive? No No No Yes Yes No No  Type of Advance Directive - - Web designer;Living will Healthcare Power of Attorney - -  Does patient want to make changes to medical advance directive? No - Patient declined - - No - Patient declined - - -  Copy of Healthcare Power of Attorney in Chart? - - - Yes Yes - -  Would patient like information on creating a medical advance directive? No - Patient declined No - Patient declined Yes  (MAU/Ambulatory/Procedural Areas - Information given) - - - No - patient declined information    Current Medications (verified) Outpatient Encounter Medications as of 11/04/2020  Medication Sig   acetaminophen (TYLENOL) 325 MG tablet Take 2 tablets (650 mg total) by mouth every 6 (six) hours as needed for mild pain.   aspirin EC 81 MG tablet Take 1 tablet (81 mg total) by mouth daily.   Boswellia-Glucosamine-Vit D (OSTEO BI-FLEX ONE PER DAY PO) Take by mouth.   Calcium Carb-Cholecalciferol 600-800 MG-UNIT TABS Take 1 tablet by mouth daily.    cetirizine (ZYRTEC) 10 MG tablet TAKE 1 TABLET EVERY DAY   Cholecalciferol (VITAMIN D) 2000 units CAPS Take 2,000 Units by mouth daily.   lisinopril (ZESTRIL) 10 MG tablet TAKE 1 TABLET EVERY DAY   metoprolol tartrate (LOPRESSOR) 25 MG tablet Take 0.5 tablets (12.5 mg total) by mouth 2 (two) times daily. NEEDS TO BE SEEN FOR FURTHER REFILLS   Red Yeast Rice 600 MG TABS Take 1 tablet by mouth daily.    fluticasone (FLONASE) 50 MCG/ACT nasal spray Place 2 sprays into both nostrils daily. (Patient not taking: Reported on 11/04/2020)   guaiFENesin (MUCINEX) 600 MG 12 hr tablet Take 1-2 tablets (600-1,200 mg total) by mouth 2 (two) times daily as needed for cough or to loosen phlegm. (Patient not taking: Reported on 11/04/2020)   No facility-administered encounter medications on file as of 11/04/2020.    Allergies (verified) Crestor [rosuvastatin], Lipitor [atorvastatin], Meloxicam, Statins, and Penicillins   History: Past Medical History:  Diagnosis Date   Allergy    seasonal  Chicken pox    Complication of anesthesia    difficult to wake up after tubal ligations   Dyspnea    with exertion   Heart murmur    History of bronchitis    History of kidney stones    "only one"   Hypertension    Measles    Past Surgical History:  Procedure Laterality Date   AORTIC VALVE REPLACEMENT N/A 01/17/2016   Procedure: AORTIC VALVE REPLACEMENT (AVR) USING  EDWARDS MAGNA EASE PERICARDIAL BIOPROSTHESIS VALVE;  Surgeon: Delight Ovens, MD;  Location: [facility] OR;  Service: Open Heart Surgery;  Laterality: N/A;   CARDIAC CATHETERIZATION N/A 09/24/2015   Procedure: Right/Left Heart Cath and Coronary Angiography;  Surgeon: Corky Crafts, MD;  Location: [facility] INVASIVE CV LAB;  Service: Cardiovascular;  Laterality: N/A;   COLONOSCOPY     REPLACEMENT ASCENDING AORTA N/A 01/17/2016   Procedure: REPLACEMENT ASCENDING AORTA USING HEMASHIELD PLATINUM WOVEN DOUBLE VELOUR VASCULAR GRAFT;  Surgeon: Delight Ovens, MD;  Location: [facility] OR;  Service: Open Heart Surgery;  Laterality: N/A;   TEE WITHOUT CARDIOVERSION N/A 01/17/2016   Procedure: TRANSESOPHAGEAL ECHOCARDIOGRAM (TEE);  Surgeon: Delight Ovens, MD;  Location: [facility] OR;  Service: Open Heart Surgery;  Laterality: N/A;   TUBAL LIGATION     Family History  Problem Relation Age of Onset   Alzheimer's disease Mother    Heart disease Mother    Heart attack Father 64   Post-traumatic stress disorder Brother    Social History   Socioeconomic History   Marital status: Widowed    Spouse name: Not on file   Number of children: 1   Years of education: business school   Highest education level: Bachelor's degree (e.g., BA, AB, BS)  Occupational History   Occupation: retired    Associate Professor: [employer]   Occupation: Retired    Comment: Biochemist, clinical  Tobacco Use   Smoking status: Former    Packs/day: 0.50    Years: 5.00    Pack years: 2.50    Types: Cigarettes    Start date: 08/04/1968    Quit date: 10/10/1973    Years since quitting: 47.1   Smokeless tobacco: Never   Tobacco comments:    smoked less than 1 pack every 2 weeks. Havent smoked in atleast 40 years  Vaping Use   Vaping Use: Never used  Substance and Sexual Activity   Alcohol use: No    Alcohol/week: 0.0 standard drinks   Drug use: No   Sexual activity: Not Currently  Other Topics Concern   Not on file  Social History  Narrative   Lives alone. Pt states son lives in [place].    Social Determinants of Health   Financial Resource Strain: Low Risk    Difficulty of Paying Living Expenses: Not hard at all  Food Insecurity: No Food Insecurity   Worried About Programme researcher, broadcasting/film/video in the Last Year: Never true   Ran Out of Food in the Last Year: Never true  Transportation Needs: No Transportation Needs   Lack of Transportation (Medical): No   Lack of Transportation (Non-Medical): No  Physical Activity: Insufficiently Active   Days of Exercise per Week: 5 days   Minutes of Exercise per Session: 20 min  Stress: No Stress Concern Present   Feeling of Stress : Not at all  Social Connections: Socially Isolated   Frequency of Communication with Friends and Family: More than three times a week   Frequency of  Social Gatherings with Friends and Family: Twice a week   Attends Religious Services: Never   Database administrator or Organizations: No   Attends Banker Meetings: Never   Marital Status: Widowed    Tobacco Counseling Counseling given: Not Answered Tobacco comments: smoked less than 1 pack every 2 weeks. Havent smoked in atleast 40 years   Clinical Intake:  Pre-visit preparation completed: Yes  Pain : 0-10 Pain Score: 5  Pain Type: Chronic pain Pain Location: Hip Pain Descriptors / Indicators: Aching Pain Onset: More than a month ago Pain Frequency: Intermittent Pain Relieving Factors: Rest and Tylenol  Pain Relieving Factors: Rest and Tylenol  BMI - recorded: 44.03 Nutritional Status: BMI > 30  Obese Nutritional Risks: Other (Comment) Diabetes: No  How often do you need to have someone help you when you read instructions, pamphlets, or other written materials from your doctor or pharmacy?: 1 - Never  Diabetic?NO  Interpreter Needed?: No  Information entered by :: Venia Carbon, LPN   Activities of Daily Living In your present state of health, do you have any  difficulty performing the following activities: 11/04/2020  Hearing? N  Vision? N  Difficulty concentrating or making decisions? N  Walking or climbing stairs? Y  Comment Due to hip pain.  Dressing or bathing? N  Doing errands, shopping? N  Preparing Food and eating ? N  Using the Toilet? N  In the past six months, have you accidently leaked urine? N  Do you have problems with loss of bowel control? N  Managing your Medications? N  Managing your Finances? N  Housekeeping or managing your Housekeeping? N  Some recent data might be hidden    Patient Care Team: Bennie Pierini, FNP as PCP - General (Family Medicine) Wyline Mood, Dorothe Pea, MD as PCP - Cardiology (Cardiology) Delight Ovens, MD (Inactive) as Consulting Physician (Cardiothoracic Surgery)  Indicate any recent Medical Services you may have received from other than [facility] providers in the past year (date may be approximate).     Assessment:   This is a routine wellness examination for [place].  Hearing/Vision screen Hearing Screening - Comments:: No hearing issues. Vision Screening - Comments:: Glasses. Last eye exam 7 years. Pt states she is planning to see Dr. Conley [facility]s at [facility].   Dietary issues and exercise activities discussed: Current Exercise Habits: Home exercise routine, Type of exercise: Other - see comments (Upper body exercises.), Time (Minutes): 15, Frequency (Times/Week): 5, Weekly Exercise (Minutes/Week): 75, Exercise limited by: orthopedic condition(s);cardiac condition(s)   Goals Addressed             This Visit's Progress    DIET - INCREASE WATER INTAKE   On track    Try to drink 6-8 glasses of water daily     Exercise 150 min/wk Moderate Activity   On track    Pt states she does upper body exercises because of her hip pain.        Depression Screen PHQ 2/9 Scores 11/04/2020 04/27/2020 10/27/2019 10/03/2019 05/30/2019 04/04/2019 10/16/2018  PHQ - 2 Score 0 0 0 0 0 0 0  PHQ- 9 Score - - - - - - -     Fall Risk Fall Risk  11/04/2020 04/27/2020 10/27/2019 10/03/2019 05/30/2019  Falls in the past year? 1 0 0 0 0  Comment Pt states she had taken dog out and was wet from rain and slipped. - - - -  Number falls in past yr: 0 - - - -  Injury with Fall? 0 - - - -  Comment Bruises and scrapes. - - - -  Risk for fall due to : History of fall(s);Orthopedic patient - - - -  Follow up Falls prevention discussed - - - -    FALL RISK PREVENTION PERTAINING TO THE HOME:  Any stairs in or around the home? Yes  If so, are there any without handrails? No  Home free of loose throw rugs in walkways, pet beds, electrical cords, etc? Yes  Adequate lighting in your home to reduce risk of falls? Yes   ASSISTIVE DEVICES UTILIZED TO PREVENT FALLS:  Life alert? No  Use of a cane, walker or w/c? No  Grab bars in the bathroom? No  Shower chair or bench in shower? No  Elevated toilet seat or a handicapped toilet? No   TIMED UP AND GO:  Was the test performed? No . PHONE VISIT.  Cognitive Function: MMSE - Mini Mental State Exam 05/15/2017 10/11/2015  Orientation to time 5 5  Orientation to Place 5 5  Registration 3 3  Attention/ Calculation 5 5  Recall 3 3  Language- name 2 objects 2 2  Language- repeat 1 1  Language- follow 3 step command 3 3  Language- read & follow direction 1 1  Write a sentence 1 1  Copy design 1 1  Total score 30 30     6CIT Screen 10/27/2019 10/16/2018  What Year? 0 points 0 points  What month? 0 points 0 points  What time? 0 points 0 points  Count back from 20 0 points 0 points  Months in reverse 2 points 0 points  Repeat phrase 0 points 0 points  Total Score 2 0    Immunizations Immunization History  Administered Date(s) Administered   Fluad Quad(high Dose 65+) 12/02/2018, 02/05/2020   Influenza, High Dose Seasonal PF 12/01/2015, 03/20/2017, 01/24/2018   Moderna Sars-Covid-2 Vaccination 04/15/2019, 05/13/2019, 01/21/2020   Pneumococcal Conjugate-13 10/11/2015    Pneumococcal Polysaccharide-23 03/20/2017    TDAP status: Due, Education has been provided regarding the importance of this vaccine. Advised may receive this vaccine at local pharmacy or Health Dept. Aware to provide a copy of the vaccination record if obtained from local pharmacy or Health Dept. Verbalized acceptance and understanding.  Flu Vaccine status: Due, Education has been provided regarding the importance of this vaccine. Advised may receive this vaccine at local pharmacy or Health Dept. Aware to provide a copy of the vaccination record if obtained from local pharmacy or Health Dept. Verbalized acceptance and understanding.  Pneumococcal vaccine status: Up to date  Covid-19 vaccine status: Completed vaccines  Qualifies for Shingles Vaccine? Yes   Zostavax completed No   Shingrix Completed?: No.    Education has been provided regarding the importance of this vaccine. Patient has been advised to call insurance company to determine out of pocket expense if they have not yet received this vaccine. Advised may also receive vaccine at local pharmacy or Health Dept. Verbalized acceptance and understanding.  Screening Tests Health Maintenance  Topic Date Due   Zoster Vaccines- Shingrix (1 of 2) Never done   TETANUS/TDAP  07/17/2017   COVID-19 Vaccine (4 - Booster for Moderna series) 05/20/2020   INFLUENZA VACCINE  10/04/2020   MAMMOGRAM  04/14/2021   COLONOSCOPY (Pts 45-497yrs Insurance coverage will need to be confirmed)  11/10/2021   DEXA SCAN  04/02/2022   Hepatitis C Screening  Completed   PNA vac Low Risk Adult  Completed  HPV VACCINES  Aged Out    Health Maintenance  Health Maintenance Due  Topic Date Due   Zoster Vaccines- Shingrix (1 of 2) Never done   TETANUS/TDAP  07/17/2017   COVID-19 Vaccine (4 - Booster for Moderna series) 05/20/2020   INFLUENZA VACCINE  10/04/2020    Colorectal cancer screening: Type of screening: Colonoscopy. Completed 11/11/2011. Repeat every 10  years  Mammogram status: Completed 04/15/19. Repeat every year  Bone Density status: Completed 04/02/2017. Results reflect: Bone density results: OSTEOPENIA. Repeat every 5 years.  Lung Cancer Screening: (Low Dose CT Chest recommended if Age 26-80 years, 30 pack-year currently smoking OR have quit w/in 15years.) does not qualify.    Additional Screening:  Hepatitis C Screening: does qualify; Completed 02/15/2015  Vision Screening: Recommended annual ophthalmology exams for early detection of glaucoma and other disorders of the eye. Is the patient up to date with their annual eye exam?  No  Who is the provider or what is the name of the office in which the patient attends annual eye exams? Pt states she is going to schedule and appointment with Dr. Conley Rolls If pt is not established with a provider, would they like to be referred to a provider to establish care? No .   Dental Screening: Recommended annual dental exams for proper oral hygiene  Community Resource Referral / Chronic Care Management: CRR required this visit?  No   CCM required this visit?  No      Plan:     I have personally reviewed and noted the following in the patient's chart:   Medical and social history Use of alcohol, tobacco or illicit drugs  Current medications and supplements including opioid prescriptions.  Functional ability and status Nutritional status Physical activity Advanced directives List of other physicians Hospitalizations, surgeries, and ER visits in previous 12 months Vitals Screenings to include cognitive, depression, and falls Referrals and appointments  In addition, I have reviewed and discussed with patient certain preventive protocols, quality metrics, and best practice recommendations. A written personalized care plan for preventive services as well as general preventive health recommendations were provided to patient.     Darral Dash, LPN   10/08/4625   Nurse Notes: Pt states  she is going to schedule and appointment with Dr. Conley Rolls for eye exam.   I have reviewed and agree with the above AWV documentation.   Perle Gibbon-Margaret Daphine Deutscher, FNP

## 2020-11-05 ENCOUNTER — Other Ambulatory Visit: Payer: Self-pay | Admitting: Nurse Practitioner

## 2020-11-05 DIAGNOSIS — Z952 Presence of prosthetic heart valve: Secondary | ICD-10-CM

## 2020-12-27 ENCOUNTER — Ambulatory Visit (INDEPENDENT_AMBULATORY_CARE_PROVIDER_SITE_OTHER): Payer: Medicare Other | Admitting: Nurse Practitioner

## 2020-12-27 ENCOUNTER — Other Ambulatory Visit: Payer: Self-pay

## 2020-12-27 ENCOUNTER — Encounter: Payer: Self-pay | Admitting: Nurse Practitioner

## 2020-12-27 VITALS — BP 124/82 | HR 78 | Temp 98.6°F | Resp 20 | Ht 59.0 in | Wt 223.0 lb

## 2020-12-27 DIAGNOSIS — Z23 Encounter for immunization: Secondary | ICD-10-CM | POA: Diagnosis not present

## 2020-12-27 DIAGNOSIS — I10 Essential (primary) hypertension: Secondary | ICD-10-CM | POA: Diagnosis not present

## 2020-12-27 DIAGNOSIS — I77810 Thoracic aortic ectasia: Secondary | ICD-10-CM

## 2020-12-27 DIAGNOSIS — M25551 Pain in right hip: Secondary | ICD-10-CM | POA: Diagnosis not present

## 2020-12-27 DIAGNOSIS — Z952 Presence of prosthetic heart valve: Secondary | ICD-10-CM

## 2020-12-27 DIAGNOSIS — E782 Mixed hyperlipidemia: Secondary | ICD-10-CM | POA: Diagnosis not present

## 2020-12-27 DIAGNOSIS — M25552 Pain in left hip: Secondary | ICD-10-CM

## 2020-12-27 MED ORDER — LISINOPRIL 20 MG PO TABS: 20.0000 mg | ORAL_TABLET | Freq: Every day | ORAL | 3 refills | Status: DC

## 2020-12-27 MED ORDER — METOPROLOL TARTRATE 25 MG PO TABS: 25.0000 mg | ORAL_TABLET | Freq: Two times a day (BID) | ORAL | 1 refills | Status: DC

## 2020-12-27 NOTE — Progress Notes (Signed)
Subjective:    Patient ID: Angela Dawson, female    DOB: [birth year], 75 y.o.   MRN: [id number]   Chief Complaint: medical management of chronic issues     HPI:  1. Primary hypertension No c/o chest pain, sob or headache. Does check blood pressure at homeat home is 120's systolic. BP Readings from Last 3 Encounters:  12/27/20 (!) 152/90  04/27/20 (!) 145/69  10/03/19 (!) 130/77      2. Mixed hyperlipidemia Does not watch diet but does no dedicated exercise. Cannot tolerate a statin. Not interested in repatha Lab Results  Component Value Date   CHOL 221 (H) 04/27/2020   HDL 48 04/27/2020   LDLCALC 143 (H) 04/27/2020   TRIG 169 (H) 04/27/2020   CHOLHDL 4.6 (H) 04/27/2020    3. Ascending aorta dilatation (HCC) She has had an aortic sleeve done. Last CT angio was done in 2018 which was stable. Last cardiology appointment was done 08/13/19. At that appointment no changes were made to plan of care. She was suppose to follow up in 1 year but has not done so. Has an appointment scheduled fo rnext month.  4. Severe obesity (BMI >= 40) (HCC) No recent eight changes Wt Readings from Last 3 Encounters:  11/04/20 218 lb (98.9 kg)  04/27/20 218 lb (98.9 kg)  10/03/19 (!) 215 lb (97.5 kg)   BMI Readings from Last 3 Encounters:  11/04/20 44.03 kg/m  04/27/20 44.03 kg/m  10/03/19 43.42 kg/m       Outpatient Encounter Medications as of 12/27/2020  Medication Sig   acetaminophen (TYLENOL) 325 MG tablet Take 2 tablets (650 mg total) by mouth every 6 (six) hours as needed for mild pain.   aspirin EC 81 MG tablet Take 1 tablet (81 mg total) by mouth daily.   Boswellia-Glucosamine-Vit D (OSTEO BI-FLEX ONE PER DAY PO) Take by mouth.   Calcium Carb-Cholecalciferol 600-800 MG-UNIT TABS Take 1 tablet by mouth daily.    cetirizine (ZYRTEC) 10 MG tablet TAKE 1 TABLET EVERY DAY   Cholecalciferol (VITAMIN D) 2000 units CAPS Take 2,000 Units by mouth daily.   fluticasone (FLONASE) 50  MCG/ACT nasal spray Place 2 sprays into both nostrils daily. (Patient not taking: Reported on 11/04/2020)   guaiFENesin (MUCINEX) 600 MG 12 hr tablet Take 1-2 tablets (600-1,200 mg total) by mouth 2 (two) times daily as needed for cough or to loosen phlegm. (Patient not taking: Reported on 11/04/2020)   lisinopril (ZESTRIL) 10 MG tablet TAKE 1 TABLET EVERY DAY   metoprolol tartrate (LOPRESSOR) 25 MG tablet TAKE 1/2 TABLET TWICE DAILY . NEEDS TO BE SEEN FOR FURTHER REFILLS   Red Yeast Rice 600 MG TABS Take 1 tablet by mouth daily.    No facility-administered encounter medications on file as of 12/27/2020.    Past Surgical History:  Procedure Laterality Date   AORTIC VALVE REPLACEMENT N/A 01/17/2016   Procedure: AORTIC VALVE REPLACEMENT (AVR) USING EDWARDS MAGNA EASE PERICARDIAL BIOPROSTHESIS VALVE;  Surgeon: Delight Ovens, MD;  Location: [facility] OR;  Service: Open Heart Surgery;  Laterality: N/A;   CARDIAC CATHETERIZATION N/A 09/24/2015   Procedure: Right/Left Heart Cath and Coronary Angiography;  Surgeon: Corky Crafts, MD;  Location[facility] INVASIVE CV LAB;  Service: Cardiovascular;  Laterality: N/A;   COLONOSCOPY     REPLACEMENT ASCENDING AORTA N/A 01/17/2016   Procedure: REPLACEMENT ASCENDING AORTA USING HEMASHIELD PLATINUM WOVEN DOUBLE VELOUR VASCULAR GRAFT;  Surgeon: Delight Ovens, MD;  Location: [facility] OR;  Service: Open Heart Surgery;  Laterality: N/A;   TEE WITHOUT CARDIOVERSION N/A 01/17/2016   Procedure: TRANSESOPHAGEAL ECHOCARDIOGRAM (TEE);  Surgeon: Delight Ovens, MD;  Location: [facility] OR;  Service: Open Heart Surgery;  Laterality: N/A;   TUBAL LIGATION      Family History  Problem Relation Age of Onset   Alzheimer's disease Mother    Heart disease Mother    Heart attack Father 41   Post-traumatic stress disorder Brother     New complaints: Bil hip pain. Worse when walking up hill. Wants to see ortho.  Social history: Lives by herself with her dog  "ginger"  Controlled substance contract: n/a     Review of Systems  Constitutional:  Negative for diaphoresis.  Eyes:  Negative for pain.  Respiratory:  Negative for shortness of breath.   Cardiovascular:  Negative for chest pain, palpitations and leg swelling.  Gastrointestinal:  Negative for abdominal pain.  Endocrine: Negative for polydipsia.  Skin:  Negative for rash.  Neurological:  Negative for dizziness, weakness and headaches.  Hematological:  Does not bruise/bleed easily.  All other systems reviewed and are negative.     Objective:   Physical Exam Vitals and nursing note reviewed.  Constitutional:      General: She is not in acute distress.    Appearance: Normal appearance. She is well-developed.  HENT:     Head: Normocephalic.     Right Ear: Tympanic membrane normal.     Left Ear: Tympanic membrane normal.     Nose: Nose normal.     Mouth/Throat:     Mouth: Mucous membranes are moist.  Eyes:     Pupils: Pupils are equal, round, and reactive to light.  Neck:     Vascular: No carotid bruit or JVD.  Cardiovascular:     Rate and Rhythm: Normal rate and regular rhythm.     Heart sounds: Normal heart sounds.  Pulmonary:     Effort: Pulmonary effort is normal. No respiratory distress.     Breath sounds: Normal breath sounds. No wheezing or rales.  Chest:     Chest wall: No tenderness.  Abdominal:     General: Bowel sounds are normal. There is no distension or abdominal bruit.     Palpations: Abdomen is soft. There is no hepatomegaly, splenomegaly, mass or pulsatile mass.     Tenderness: There is no abdominal tenderness.  Musculoskeletal:        General: Normal range of motion.     Cervical back: Normal range of motion and neck supple.     Right lower leg: Edema (1+) present.     Left lower leg: Edema (1+) present.  Lymphadenopathy:     Cervical: No cervical adenopathy.  Skin:    General: Skin is warm and dry.  Neurological:     Mental Status: She is alert  and oriented to person, place, and time.     Deep Tendon Reflexes: Reflexes are normal and symmetric.  Psychiatric:        Behavior: Behavior normal.        Thought Content: Thought content normal.        Judgment: Judgment normal.    BP 124/82 Comment: home  Pulse 78   Temp 98.6 F (37 C) (Temporal)   Resp 20   Ht 4\' 11"  (1.499 m)   Wt 223 lb (101.2 kg)   SpO2 95%   BMI 45.04 kg/m        Assessment & Plan:  AWA BACHICHA comes in today with chief complaint of Medical Management of Chronic Issues   Diagnosis and orders addressed:  1. Primary hypertension Low sodium diet - lisinopril (ZESTRIL) 20 MG tablet; Take 1 tablet (20 mg total) by mouth daily.  Dispense: 90 tablet; Refill: 3 - CBC with Differential/Platelet - CMP14+EGFR  2. Mixed hyperlipidemia Low fat diet - Lipid panel  3. Ascenading aorta dilatation (HCC) Will see crdiology next month  4. Severe obesity (BMI >= 40) (HCC) Discussed diet and exercise for person with BMI >25 Will recheck weight in 3-6 months   5. Bilateral hip pain Referral to ortho - Ambulatory referral to Orthopedic Surgery  6. S/P AVR (aortic valve replacement) - metoprolol tartrate (LOPRESSOR) 25 MG tablet; Take 1 tablet (25 mg total) by mouth 2 (two) times daily.  Dispense: 90 tablet; Refill: 1   Labs pending Health Maintenance reviewed Diet and exercise encouraged  Follow up plan: 6 months   Mary-Margaret Hassell Done, FNP

## 2020-12-27 NOTE — Patient Instructions (Signed)
Hip Pain The hip is the joint between the upper legs and the lower pelvis. The bones, cartilage, tendons, and muscles of your hip joint support your body and allow you to move around. Hip pain can range from a minor ache to severe pain in one or both of your hips. The pain may be felt on the inside of the hip joint near the groin, or on the outside near the buttocks and upper thigh. You may also have swelling or stiffness in your hip area. Follow these instructions at home: Managing pain, stiffness, and swelling   If directed, put ice on the painful area. To do this: Put ice in a plastic bag. Place a towel between your skin and the bag. Leave the ice on for 20 minutes, 2-3 times a day. If directed, apply heat to the affected area as often as told by your health care provider. Use the heat source that your health care provider recommends, such as a moist heat pack or a heating pad. Place a towel between your skin and the heat source. Leave the heat on for 20-30 minutes. Remove the heat if your skin turns bright red. This is especially important if you are unable to feel pain, heat, or cold. You may have a greater risk of getting burned. Activity Do exercises as told by your health care provider. Avoid activities that cause pain. General instructions  Take over-the-counter and prescription medicines only as told by your health care provider. Keep a journal of your symptoms. Write down: How often you have hip pain. The location of your pain. What the pain feels like. What makes the pain worse. Sleep with a pillow between your legs on your most comfortable side. Keep all follow-up visits as told by your health care provider. This is important. Contact a health care provider if: You cannot put weight on your leg. Your pain or swelling continues or gets worse after one week. It gets harder to walk. You have a fever. Get help right away if: You fall. You have a sudden increase in pain and  swelling in your hip. Your hip is red or swollen or very tender to touch. Summary Hip pain can range from a minor ache to severe pain in one or both of your hips. The pain may be felt on the inside of the hip joint near the groin, or on the outside near the buttocks and upper thigh. Avoid activities that cause pain. Write down how often you have hip pain, the location of the pain, what makes it worse, and what it feels like. This information is not intended to replace advice given to you by your health care provider. Make sure you discuss any questions you have with your health care provider. Document Revised: 07/08/2018 Document Reviewed: 07/08/2018 Elsevier Patient Education  2022 Elsevier Inc.  

## 2020-12-28 LAB — CBC WITH DIFFERENTIAL/PLATELET
Basophils Absolute: 0.1 10*3/uL (ref 0.0–0.2)
Basos: 1 %
EOS (ABSOLUTE): 0.2 10*3/uL (ref 0.0–0.4)
Eos: 2 %
Hematocrit: 44.4 % (ref 34.0–46.6)
Hemoglobin: 14.7 g/dL (ref 11.1–15.9)
Immature Grans (Abs): 0 10*3/uL (ref 0.0–0.1)
Immature Granulocytes: 0 %
Lymphocytes Absolute: 3.6 10*3/uL — ABNORMAL HIGH (ref 0.7–3.1)
Lymphs: 38 %
MCH: 32 pg (ref 26.6–33.0)
MCHC: 33.1 g/dL (ref 31.5–35.7)
MCV: 97 fL (ref 79–97)
Monocytes Absolute: 0.7 10*3/uL (ref 0.1–0.9)
Monocytes: 7 %
Neutrophils Absolute: 4.9 10*3/uL (ref 1.4–7.0)
Neutrophils: 52 %
Platelets: 244 10*3/uL (ref 150–450)
RBC: 4.6 x10E6/uL (ref 3.77–5.28)
RDW: 12.6 % (ref 11.7–15.4)
WBC: 9.5 10*3/uL (ref 3.4–10.8)

## 2020-12-28 LAB — CMP14+EGFR
ALT: 15 IU/L (ref 0–32)
AST: 18 IU/L (ref 0–40)
Albumin/Globulin Ratio: 1.8 (ref 1.2–2.2)
Albumin: 4.4 g/dL (ref 3.7–4.7)
Alkaline Phosphatase: 74 IU/L (ref 44–121)
BUN/Creatinine Ratio: 16 (ref 12–28)
BUN: 13 mg/dL (ref 8–27)
Bilirubin Total: 0.3 mg/dL (ref 0.0–1.2)
CO2: 23 mmol/L (ref 20–29)
Calcium: 9.3 mg/dL (ref 8.7–10.3)
Chloride: 103 mmol/L (ref 96–106)
Creatinine, Ser: 0.79 mg/dL (ref 0.57–1.00)
Globulin, Total: 2.4 g/dL (ref 1.5–4.5)
Glucose: 92 mg/dL (ref 70–99)
Potassium: 4.3 mmol/L (ref 3.5–5.2)
Sodium: 143 mmol/L (ref 134–144)
Total Protein: 6.8 g/dL (ref 6.0–8.5)
eGFR: 78 mL/min/{1.73_m2} (ref >59)

## 2020-12-28 LAB — LIPID PANEL
Chol/HDL Ratio: 4.4 ratio (ref 0.0–4.4)
Cholesterol, Total: 207 mg/dL — ABNORMAL HIGH (ref 100–199)
HDL: 47 mg/dL (ref >39)
LDL Chol Calc (NIH): 136 mg/dL — ABNORMAL HIGH (ref 0–99)
Triglycerides: 136 mg/dL (ref 0–149)
VLDL Cholesterol Cal: 24 mg/dL (ref 5–40)

## 2021-01-20 ENCOUNTER — Ambulatory Visit: Payer: Medicare Other | Admitting: Cardiology

## 2021-01-24 DIAGNOSIS — M7061 Trochanteric bursitis, right hip: Secondary | ICD-10-CM | POA: Diagnosis not present

## 2021-01-24 DIAGNOSIS — M25552 Pain in left hip: Secondary | ICD-10-CM | POA: Diagnosis not present

## 2021-01-24 DIAGNOSIS — M25551 Pain in right hip: Secondary | ICD-10-CM | POA: Diagnosis not present

## 2021-01-24 DIAGNOSIS — M7062 Trochanteric bursitis, left hip: Secondary | ICD-10-CM | POA: Diagnosis not present

## 2021-01-24 DIAGNOSIS — Z6841 Body Mass Index (BMI) 40.0 and over, adult: Secondary | ICD-10-CM | POA: Diagnosis not present

## 2021-01-24 NOTE — Progress Notes (Signed)
Cardiology Office Note  Date: 01/25/2021   ID: Angela, Dawson 1945/12/12, MRN [id number]  PCP:  Bennie Pierini, FNP  Cardiologist:  Dina Rich, MD Electrophysiologist:  None   Chief Complaint: 1 year follow-up  History of Present Illness: Angela Dawson is a 75 y.o. female with a history of HTN, ascending aortic dilatation, HLD, stress previous aortic valve replacement, severe obesity, statin intolerance, DOE.  She was last seen by Dr. Wyline Mood on 08/13/2019 she was status post aortic valve replacement [employer] pericardial tissue valve  21 mm and supra coronary replacement of the ascending aorta on November 2017 denies any recent significant chest pain.  Had some mild shortness of breath with high level of activity.  She was compliant with her hypertensive medications.  Blood pressures at home more typically in the 130s over 60s.  She was allergic to statins.  Her last LDL in August 2018 was 135.  Zetia was too expensive.  She has been working on diet control.  Sh last visit she was doing well without significant symptoms after aortic valve repair and ascending aorta replacement.  She had some shortness of breath with high-level with exertion with activity.  Dr. Wyline Mood noted this was more likely related to deconditioning and planned to monitor.  BP was at goal and plans were to continue to monitor.  EKG during that visit showed sinus rhythm with no ischemic changes.  She is here today for 1 year follow-up.  She states she had COVID in the interim since last visit.  Denies any issues other than some left lower extremity swelling which started approximately a month ago.  She denies any pain or claudication-like symptoms.  Denies any pleuritic chest pain or tachycardia.  Blood pressure is elevated today but states her BP at home is usually well controlled.  She states her primary care provider recently increased her lisinopril to 20 mg for better blood pressure control.   She continues on aspirin and metoprolol 25 mg p.o. twice daily as well as her red yeast rice for management of her hyperlipidemia.  She is multi statin intolerant.  Recent LDL was 136 which is an improvement from previous levels.  Her EKG today shows normal sinus rhythm with a rate of 73.  She is currently concerned about the swelling in her left leg and ankle.  We discussed getting up lower extremity venous study to rule out DVT.  She is agreeable.   Past Medical History:  Diagnosis Date   Allergy    seasonal   Chicken pox    Complication of anesthesia    difficult to wake up after tubal ligations   Dyspnea    with exertion   Heart murmur    History of bronchitis    History of kidney stones    "only one"   Hypertension    Measles     Past Surgical History:  Procedure Laterality Date   AORTIC VALVE REPLACEMENT N/A 01/17/2016   Procedure: AORTIC VALVE REPLACEMENT (AVR) USING EDWARDS MAGNA EASE PERICARDIAL BIOPROSTHESIS VALVE;  Surgeon: Delight Ovens, MD;  Location: [facility] OR;  Service: Open Heart Surgery;  Laterality: N/A;   CARDIAC CATHETERIZATION N/A 09/24/2015   Procedure: Right/Left Heart Cath and Coronary Angiography;  Surgeon: Corky Crafts, MD;  Location: [facility] INVASIVE CV LAB;  Service: Cardiovascular;  Laterality: N/A;   COLONOSCOPY     REPLACEMENT ASCENDING AORTA N/A 01/17/2016   Procedure: REPLACEMENT ASCENDING AORTA USING HEMASHIELD PLATINUM  WOVEN DOUBLE VELOUR VASCULAR GRAFT;  Surgeon: Delight Ovens, MD;  Location[facility];  Service: Open Heart Surgery;  Laterality: N/A;   TEE WITHOUT CARDIOVERSION N/A 01/17/2016   Procedure: TRANSESOPHAGEAL ECHOCARDIOGRAM (TEE);  Surgeon: Delight Ovens, MD;  Location: [facility] OR;  Service: Open Heart Surgery;  Laterality: N/A;   TUBAL LIGATION      Current Outpatient Medications  Medication Sig Dispense Refill   acetaminophen (TYLENOL) 325 MG tablet Take 2 tablets (650 mg total) by mouth every 6 (six) hours as needed for  mild pain.     aspirin EC 81 MG tablet Take 1 tablet (81 mg total) by mouth daily. 90 tablet 3   Boswellia-Glucosamine-Vit D (OSTEO BI-FLEX ONE PER DAY PO) Take by mouth.     Calcium Carb-Cholecalciferol 600-800 MG-UNIT TABS Take 1 tablet by mouth daily.      cetirizine (ZYRTEC) 10 MG tablet TAKE 1 TABLET EVERY DAY 90 tablet 1   Cholecalciferol (VITAMIN D) 2000 units CAPS Take 2,000 Units by mouth daily.     lisinopril (ZESTRIL) 20 MG tablet Take 1 tablet (20 mg total) by mouth daily. 90 tablet 3   metoprolol tartrate (LOPRESSOR) 25 MG tablet Take 1 tablet (25 mg total) by mouth 2 (two) times daily. 90 tablet 1   Red Yeast Rice 600 MG TABS Take 1 tablet by mouth daily.      No current facility-administered medications for this visit.   Allergies:  Crestor [rosuvastatin], Lipitor [atorvastatin], Meloxicam, Statins, and Penicillins   Social History: The patient  reports that she quit smoking about 47 years ago. Her smoking use included cigarettes. She started smoking about 52 years ago. She has a 2.50 pack-year smoking history. She has never used smokeless tobacco. She reports that she does not drink alcohol and does not use drugs.   Family History: The patient's family history includes Alzheimer's disease in her mother; Heart attack (age of onset: 24) in her father; Heart disease in her mother; Post-traumatic stress disorder in her brother.   ROS:  Please see the history of present illness. Otherwise, complete review of systems is positive for none.  All other systems are reviewed and negative.   Physical Exam: VS:  BP (!) 146/72   Pulse 75   Ht 4\' 11"  (1.499 m)   Wt 225 lb 9.6 oz (102.3 kg)   SpO2 95%   BMI 45.57 kg/m , BMI Body mass index is 45.57 kg/m.  Wt Readings from Last 3 Encounters:  01/25/21 225 lb 9.6 oz (102.3 kg)  12/27/20 223 lb (101.2 kg)  11/04/20 218 lb (98.9 kg)    General: Obese patient appears comfortable at rest. Neck: Supple, no elevated JVP or carotid bruits,  no thyromegaly. Lungs: Clear to auscultation, nonlabored breathing at rest. Cardiac: Regular rate and rhythm, no S3 or significant systolic murmur, no pericardial rub. Extremities: Left lower extremity swelling and discomfort, distal pulses 2+. Skin: Warm and dry. Musculoskeletal: No kyphosis. Neuropsychiatric: Alert and oriented x3, affect grossly appropriate.  ECG: 01/25/2021 normal sinus rhythm rate of 73.  Recent Labwork: 12/27/2020: ALT 15; AST 18; BUN 13; Creatinine, Ser 0.79; Hemoglobin 14.7; Platelets 244; Potassium 4.3; Sodium 143     Component Value Date/Time   CHOL 207 (H) 12/27/2020 1433   CHOL 202 (H) 07/17/2012 1652   TRIG 136 12/27/2020 1433   TRIG 119 07/29/2014 1139   TRIG 129 07/17/2012 1652   HDL 47 12/27/2020 1433   HDL 52 07/29/2014 1139   HDL  45 07/17/2012 1652   CHOLHDL 4.4 12/27/2020 1433   LDLCALC 136 (H) 12/27/2020 1433   LDLCALC 156 (H) 09/30/2013 0909   LDLCALC 131 (H) 07/17/2012 1652    Other Studies Reviewed Today:  09/2013 Echo Study Conclusions  - Left ventricle: The cavity size was normal. Wall thickness was increased in a pattern of mild LVH. Systolic function was vigorous. The estimated ejection fraction was in the range of 65% to 70%. Wall motion was normal; there were no regional wall motion abnormalities. Doppler parameters are consistent with abnormal left ventricular relaxation (grade 1 diastolic dysfunction). - Aortic valve: Bicuspid; moderately calcified leaflets. Cusp separation was reduced. There was moderate to severe stenosis. There was mild regurgitation. Mean gradient (S): 21 mm Hg. LVOT/AV VTI ratio 0.32. Peak gradient (S): 40 mm Hg. Valve area (VTI): 0.93 cm^2. Valve area (Vmax): 0.96 cm^2. - Ascending aorta: The ascending aorta was moderately dilated. Approximately 46 mm. - Mitral valve: Calcified annulus. There was trivial regurgitation. - Right atrium: Central venous pressure (est): 3 mm Hg. - Atrial septum: No  defect or patent foramen ovale was identified. - Tricuspid valve: There was trivial regurgitation. - Pulmonary arteries: PA peak pressure: 29 mm Hg (S). - Pericardium, extracardiac: There was no pericardial effusion.  Impressions:  - Mild LVH with LVEF 65-70%, grade 1 diastolic dysfunction. Moderate to severe aortic stenosis as outlined with bicuspid aortic valve and mild aortic regurgitation. There is also at least moderate dilatation of the ascending aorta based on limited views. Given association of aortic aneurysmal disease with bicuspid aortic valve, would suggest further dedicated imaging of the thoracic aorta with CTA or MRA. Normal PASP 29 mmHg.     05/2014 CTA chest IMPRESSION: Maximal diameter of the ascending aorta is 4.9 cm compare with 4.8 cm on the prior study. No evidence of dissection.   There are airspace and ground-glass opacities in the left upper lobe most consistent with an inflammatory process. There is also a 2 mm nodule. If the patient is at high risk for bronchogenic carcinoma, follow-up chest CT at 1 year is recommended. If the patient is at low risk, no follow-up is needed. This recommendation follows the consensus statement: Guidelines for Management of Small Pulmonary Nodules Detected on CT Scans: A Statement from the Fleischner Society as published in Radiology 2005; 237:395-400.     ADDENDUM: The patient therefore has a thoracic aortic aneurysm that has only increased from 4.8 cm to 4.9 cm since August of last year. Ascending thoracic aortic aneurysm. Recommend semi-annual imaging followup by CTA or MRA and referral to cardiothoracic surgery if not already obtained. This recommendation follows 2010 ACCF/AHA/AATS/ACR/ASA/SCA/SCAI/SIR/STS/SVM Guidelines for the Diagnosis and Management of Patients With Thoracic Aortic Disease. Circulation. 2010; 121: O676-H209     06/2014 Echo Study Conclusions  - Left ventricle: The cavity size was normal.  Systolic function was   vigorous. The estimated ejection fraction was in the range of 65%   to 70%. Wall motion was normal; there were no regional wall   motion abnormalities. Features are consistent with a pseudonormal   left ventricular filling pattern, with concomitant abnormal   relaxation and increased filling pressure (grade 2 diastolic   dysfunction). Doppler parameters are consistent with high   ventricular filling pressure. Mild to moderate concentric left   ventricular hypertrophy. - Aortic valve: Mildly to moderately calcified annulus. Moderately   thickened, severely calcified leaflets. Cusp separation was   reduced. There was moderate to severe stenosis. There was mild  regurgitation. Peak velocity (S): 364 cm/s. Mean gradient: 32   mmHg. Valve area (Vmax): 0.91 cm^2. - Aorta: Ascending aortic diameter: 43 mm (S). Mild ascending   aortic dilatation. - Mitral valve: Mildly to moderately calcified annulus. There was   mild regurgitation.  Impressions:  - When compared to the report dated 10/02/13, the mean aortic valve   gradient has increased by 11 mmHg. Moderate to severe stenosis is   seen.   01/2015 CTA chest IMPRESSION: Dilatation of the thoracic aorta in its ascending component measuring 4.6 x 4.6 cm in greatest dimension. This is likely stable from the prior exam as some motion artifact was seen on the prior study.   09/2015 echo Study Conclusions   - Left ventricle: The cavity size was normal. Wall thickness was   increased in a pattern of moderate LVH. Systolic function was   normal. The estimated ejection fraction was in the range of 60%   to 65%. Wall motion was normal; there were no regional wall   motion abnormalities. Features are consistent with a pseudonormal   left ventricular filling pattern, with concomitant abnormal   relaxation and increased filling pressure (grade 2 diastolic   dysfunction). Doppler parameters are consistent with high    ventricular filling pressure. - Aortic valve: Moderately thickened, severely calcified leaflets.   There was moderate to severe stenosis. There was mild   regurgitation. Peak velocity (S): 390 cm/s. Mean gradient (S): 34   mm Hg. Valve area (VTI): 1.01 cm^2. Valve area (Vmax): 0.89 cm^2.   Valve area (Vmean): 0.93 cm^2. - Aorta: Ascending aortic diameter: 46 mm (S). - Ascending aorta: The ascending aorta was mild to moderately   dilated. - Mitral valve: Calcified annulus. There was mild regurgitation. - Left atrium: The atrium was moderately dilated. - Right ventricle: Systolic function was mildly to moderately   reduced. - Tricuspid valve: There was mild-moderate regurgitation.   09/2015 Cath The left ventricular systolic function is normal. Nonobstructive CAD. Normal PA pressures. CI 2.3; PA sat 68%. Dilated ascending aorta.   Continue with plans for AVR and aneurysm repair     Assessment and Plan:  1. S/P AVR (aortic valve replacement)   2. Essential hypertension   3. Pain and swelling of left lower extremity   4. History of COVID-19    1. S/P AVR (aortic valve replacement) Status post aortic valve replacement.  Status post aortic valve replacement [employer] pericardial tissue valve  21 mm and supra coronary replacement of the ascending aorta on November 2017.   2. Essential hypertension Blood pressure elevated today at 146/72.  Patient states her blood pressures at home are much better controlled with systolics in the 125-130 range.  She states during her recent visit with PCP her lisinopril dosage was increased to 20 mg daily.  Continue lisinopril 20 mg p.o. daily.  Continue metoprolol 25 mg p.o. twice daily.   3. Pain and swelling of left lower extremity Recent left lower extremity swelling with some discomfort.  She had recent COVID infection.  She is concerned about the left lower extremity swelling.  Please get a left lower extremity venous Doppler study to  rule out DVT.  4. History of COVID-19 States she had COVID a few months ago but has recovered for the most part except for significant fatigue.  Medication Adjustments/Labs and Tests Ordered: Current medicines are reviewed at length with the patient today.  Concerns regarding medicines are outlined above.   Disposition: Follow-up with Dr.  Branch or APP 3 months  Signed, Rennis Harding, NP 01/25/2021 4:21 PM    [facility] at [facility] [address] [place], [place], [place] [address] Phone: [phone]; Fax: [phone]

## 2021-01-25 ENCOUNTER — Ambulatory Visit (INDEPENDENT_AMBULATORY_CARE_PROVIDER_SITE_OTHER): Payer: Medicare Other | Admitting: Family Medicine

## 2021-01-25 ENCOUNTER — Encounter: Payer: Self-pay | Admitting: Family Medicine

## 2021-01-25 VITALS — BP 146/72 | HR 75 | Ht 59.0 in | Wt 225.6 lb

## 2021-01-25 DIAGNOSIS — Z952 Presence of prosthetic heart valve: Secondary | ICD-10-CM

## 2021-01-25 DIAGNOSIS — Z8616 Personal history of COVID-19: Secondary | ICD-10-CM

## 2021-01-25 DIAGNOSIS — M7989 Other specified soft tissue disorders: Secondary | ICD-10-CM | POA: Diagnosis not present

## 2021-01-25 DIAGNOSIS — M79605 Pain in left leg: Secondary | ICD-10-CM | POA: Diagnosis not present

## 2021-01-25 DIAGNOSIS — I10 Essential (primary) hypertension: Secondary | ICD-10-CM | POA: Diagnosis not present

## 2021-01-25 NOTE — Patient Instructions (Signed)
Medication Instructions:  Continue all current medications.  Labwork: none  Testing/Procedures: Lower extremity venous duplex left leg Office will contact with results via phone or letter.     Follow-Up: 3 months   Any Other Special Instructions Will Be Listed Below (If Applicable).   If you need a refill on your cardiac medications before your next appointment, please call your pharmacy.

## 2021-01-26 ENCOUNTER — Ambulatory Visit (HOSPITAL_COMMUNITY)
Admission: RE | Admit: 2021-01-26 | Discharge: 2021-01-26 | Disposition: A | Discharge status: Home / Self Care | Payer: Medicare Other | Source: Ambulatory Visit | Attending: Family Medicine | Admitting: Family Medicine

## 2021-01-26 ENCOUNTER — Telehealth: Payer: Self-pay | Admitting: *Deleted

## 2021-01-26 ENCOUNTER — Other Ambulatory Visit: Payer: Self-pay

## 2021-01-26 DIAGNOSIS — M79605 Pain in left leg: Secondary | ICD-10-CM | POA: Insufficient documentation

## 2021-01-26 DIAGNOSIS — M7989 Other specified soft tissue disorders: Secondary | ICD-10-CM | POA: Diagnosis not present

## 2021-01-26 DIAGNOSIS — Z8616 Personal history of COVID-19: Secondary | ICD-10-CM

## 2021-01-26 NOTE — Telephone Encounter (Signed)
Lesle Chris, LPN  [id number]  2:28 PM EST Back to Top    Notified, copy to pcp.

## 2021-01-26 NOTE — Telephone Encounter (Signed)
-----   Message from Netta Neat., NP sent at 01/26/2021 12:37 PM EST ----- No evidence of DVT in either leg on recent lower venous study.  Netta Neat, NP  01/26/2021 12:36 PM

## 2021-02-02 ENCOUNTER — Other Ambulatory Visit: Payer: Self-pay | Admitting: Nurse Practitioner

## 2021-02-08 DIAGNOSIS — M1611 Unilateral primary osteoarthritis, right hip: Secondary | ICD-10-CM | POA: Diagnosis not present

## 2021-02-08 DIAGNOSIS — M25551 Pain in right hip: Secondary | ICD-10-CM | POA: Diagnosis not present

## 2021-02-15 DIAGNOSIS — M25551 Pain in right hip: Secondary | ICD-10-CM | POA: Diagnosis not present

## 2021-02-15 DIAGNOSIS — M1611 Unilateral primary osteoarthritis, right hip: Secondary | ICD-10-CM | POA: Diagnosis not present

## 2021-02-23 DIAGNOSIS — M25551 Pain in right hip: Secondary | ICD-10-CM | POA: Diagnosis not present

## 2021-03-30 ENCOUNTER — Other Ambulatory Visit: Payer: Self-pay | Admitting: Nurse Practitioner

## 2021-03-30 DIAGNOSIS — Z952 Presence of prosthetic heart valve: Secondary | ICD-10-CM

## 2021-04-29 ENCOUNTER — Ambulatory Visit (INDEPENDENT_AMBULATORY_CARE_PROVIDER_SITE_OTHER): Payer: Medicare Other | Admitting: Cardiology

## 2021-04-29 ENCOUNTER — Encounter: Payer: Self-pay | Admitting: Cardiology

## 2021-04-29 ENCOUNTER — Other Ambulatory Visit: Payer: Self-pay

## 2021-04-29 VITALS — BP 134/80 | HR 64 | Ht 59.0 in | Wt 225.8 lb

## 2021-04-29 DIAGNOSIS — Z952 Presence of prosthetic heart valve: Secondary | ICD-10-CM | POA: Diagnosis not present

## 2021-04-29 DIAGNOSIS — I10 Essential (primary) hypertension: Secondary | ICD-10-CM

## 2021-04-29 DIAGNOSIS — E782 Mixed hyperlipidemia: Secondary | ICD-10-CM | POA: Diagnosis not present

## 2021-04-29 NOTE — Patient Instructions (Signed)

## 2021-04-29 NOTE — Progress Notes (Signed)
Clinical Summary Angela Dawson is a 76 y.o.female seen today for follow up of the following medical problems.    1. Aortic stenosis with bicuspid AV and ascending aortic aneurysm.   - 09/2013 echo showed LVEF [id number], grade I diastolic dysfunction. Bicuspid AV with moderate to severe AS (mean grad 21, dimensionless index 0.32, valve area 0.93), mild AI. Described mild aorta dilatation from limited views, ascending aorta 4.6 cm. - CTA chest 05/2014 with asscending thoracic aortic aneurysm 4.9 cm.   01/2015 CTA chest ascending aortic aneurysm 4.6 x 4.6 cm.  Forllowed by Dr Servando Snare.  -06/2014 echo LVEF 65-70%, AVA area 0.91, mean grad 32 - 09/2015 echo LVEF 60-65%, mod to severe AS mean grad 34, AVA 1 -09/2015 cath which showed no significant CAD - 05/2016 echo:     -she is s/p AVR with [employer] Lifesciene pericardial tissue valve 78mm and supracoronary replacement of the ascending aorta on 01/18/16     - chronic SOB unchanged. No chest pain - some left leg swelling at times. DVT US was negative.      2.HTN - she is compliant with meds - home bp's typically 130s/60s   - home bp's variable. Isolated SBP in the 90s yesterday, denies any reasons for dehydration.     3. Hyperlipidemia - allergic to statins. - 10/2016 TC 206 TG 101 HDL 51 LDL 135  - zetia is too expensive, and more recent has not been interested.    - has been working on diet control, pcp started red yeast rice    12/2020 TC 207 TG 136 HDL 47 LDL 136 - not in favor of zetia.      SH: has had covid vaccine x2   Past Medical History:  Diagnosis Date   Allergy    seasonal   Chicken pox    Complication of anesthesia    difficult to wake up after tubal ligations   Dyspnea    with exertion   Heart murmur    History of bronchitis    History of kidney stones    "only one"   Hypertension    Measles      Allergies  Allergen Reactions   Crestor [Rosuvastatin]     Dizziness   Lipitor [Atorvastatin]     Unable  to stand or move   Meloxicam     Made patient dizzy and caused her heart to race   Statins     Leg cramps    Penicillins Rash    Has patient had a PCN reaction causing immediate rash, facial/tongue/throat swelling, SOB or lightheadedness with hypotension: Yes Has patient had a PCN reaction causing severe rash involving mucus membranes or skin necrosis: No Has patient had a PCN reaction that required hospitalization No Has patient had a PCN reaction occurring within the last 10 years: No If all of the above answers are "NO", then may proceed with Cephalosporin use.      Current Outpatient Medications  Medication Sig Dispense Refill   acetaminophen (TYLENOL) 325 MG tablet Take 2 tablets (650 mg total) by mouth every 6 (six) hours as needed for mild pain.     aspirin EC 81 MG tablet Take 1 tablet (81 mg total) by mouth daily. 90 tablet 3   Boswellia-Glucosamine-Vit D (OSTEO BI-FLEX ONE PER DAY PO) Take by mouth.     Calcium Carb-Cholecalciferol 600-800 MG-UNIT TABS Take 1 tablet by mouth daily.      cetirizine (ZYRTEC) 10 MG tablet TAKE 1  TABLET EVERY DAY 90 tablet 1   Cholecalciferol (VITAMIN D) 2000 units CAPS Take 2,000 Units by mouth daily.     lisinopril (ZESTRIL) 20 MG tablet Take 1 tablet (20 mg total) by mouth daily. 90 tablet 3   metoprolol tartrate (LOPRESSOR) 25 MG tablet TAKE 1 TABLET TWICE DAILY 180 tablet 0   Red Yeast Rice 600 MG TABS Take 1 tablet by mouth daily.      No current facility-administered medications for this visit.     Past Surgical History:  Procedure Laterality Date   AORTIC VALVE REPLACEMENT N/A 01/17/2016   Procedure: AORTIC VALVE REPLACEMENT (AVR) USING 21MM EDWARDS MAGNA EASE PERICARDIAL BIOPROSTHESIS VALVE;  Surgeon: Grace Isaac, MD;  Location: [facility];  Service: Open Heart Surgery;  Laterality: N/A;   CARDIAC CATHETERIZATION N/A 09/24/2015   Procedure: Right/Left Heart Cath and Coronary Angiography;  Surgeon: Jettie Booze, MD;  Location:  [facility];  Service: Cardiovascular;  Laterality: N/A;   COLONOSCOPY     REPLACEMENT ASCENDING AORTA N/A 01/17/2016   Procedure: REPLACEMENT ASCENDING AORTA USING HEMASHIELD PLATINUM 28MM WOVEN DOUBLE VELOUR VASCULAR GRAFT;  Surgeon: Grace Isaac, MD;  Location: [facility];  Service: Open Heart Surgery;  Laterality: N/A;   TEE WITHOUT CARDIOVERSION N/A 01/17/2016   Procedure: TRANSESOPHAGEAL ECHOCARDIOGRAM (TEE);  Surgeon: Grace Isaac, MD;  Location: [facility];  Service: Open Heart Surgery;  Laterality: N/A;   TUBAL LIGATION       Allergies  Allergen Reactions   Crestor [Rosuvastatin]     Dizziness   Lipitor [Atorvastatin]     Unable to stand or move   Meloxicam     Made patient dizzy and caused her heart to race   Statins     Leg cramps    Penicillins Rash    Has patient had a PCN reaction causing immediate rash, facial/tongue/throat swelling, SOB or lightheadedness with hypotension: Yes Has patient had a PCN reaction causing severe rash involving mucus membranes or skin necrosis: No Has patient had a PCN reaction that required hospitalization No Has patient had a PCN reaction occurring within the last 10 years: No If all of the above answers are "NO", then may proceed with Cephalosporin use.       Family History  Problem Relation Age of Onset   Alzheimer's disease Mother    Heart disease Mother    Heart attack Father 61   Post-traumatic stress disorder Brother      Social History Ms. Rongey reports that she quit smoking about 47 years ago. Her smoking use included cigarettes. She started smoking about 52 years ago. She has a 2.50 pack-year smoking history. She has never used smokeless tobacco. Ms. Bouknight reports no history of alcohol use.   Review of Systems CONSTITUTIONAL: No weight loss, fever, chills, weakness or fatigue.  HEENT: Eyes: No visual loss, blurred vision, double vision or yellow sclerae.No hearing loss, sneezing, congestion, runny nose or  sore throat.  SKIN: No rash or itching.  CARDIOVASCULAR: per hpi RESPIRATORY: No shortness of breath, cough or sputum.  GASTROINTESTINAL: No anorexia, nausea, vomiting or diarrhea. No abdominal pain or blood.  GENITOURINARY: No burning on urination, no polyuria NEUROLOGICAL: No headache, dizziness, syncope, paralysis, ataxia, numbness or tingling in the extremities. No change in bowel or bladder control.  MUSCULOSKELETAL: No muscle, back pain, joint pain or stiffness.  LYMPHATICS: No enlarged nodes. No history of splenectomy.  PSYCHIATRIC: No history of depression or anxiety.  ENDOCRINOLOGIC: No reports of  sweating, cold or heat intolerance. No polyuria or polydipsia.  Marland Kitchen.   Physical Examination Today's Vitals   04/29/21 1003  BP: (!) 148/74  Pulse: 64  SpO2: 97%  Weight: 225 lb 12.8 oz (102.4 kg)  Height: 4\' 11"  (1.499 m)   Body mass index is 45.61 kg/m.  Gen: resting comfortably, no acute distress HEENT: no scleral icterus, pupils equal round and reactive, no palptable cervical adenopathy,  CV: RRR, 2/6 systolic murmur rusb, no jvd Resp: Clear to auscultation bilaterally GI: abdomen is soft, non-tender, non-distended, normal bowel sounds, no hepatosplenomegaly MSK: extremities are warm, no edema.  Skin: warm, no rash Neuro:  no focal deficits Psych: appropriate affect   Diagnostic Studies 09/2013 Echo Study Conclusions  - Left ventricle: The cavity size was normal. Wall thickness was increased in a pattern of mild LVH. Systolic function was vigorous. The estimated ejection fraction was in the range of 65% to 70%. Wall motion was normal; there were no regional wall motion abnormalities. Doppler parameters are consistent with abnormal left ventricular relaxation (grade 1 diastolic dysfunction). - Aortic valve: Bicuspid; moderately calcified leaflets. Cusp separation was reduced. There was moderate to severe stenosis. There was mild regurgitation. Mean gradient (S): 21  mm Hg. LVOT/AV VTI ratio 0.32. Peak gradient (S): 40 mm Hg. Valve area (VTI): 0.93 cm^2. Valve area (Vmax): 0.96 cm^2. - Ascending aorta: The ascending aorta was moderately dilated. Approximately 46 mm. - Mitral valve: Calcified annulus. There was trivial regurgitation. - Right atrium: Central venous pressure (est): 3 mm Hg. - Atrial septum: No defect or patent foramen ovale was identified. - Tricuspid valve: There was trivial regurgitation. - Pulmonary arteries: PA peak pressure: 29 mm Hg (S). - Pericardium, extracardiac: There was no pericardial effusion.  Impressions:  - Mild LVH with LVEF 65-70%, grade 1 diastolic dysfunction. Moderate to severe aortic stenosis as outlined with bicuspid aortic valve and mild aortic regurgitation. There is also at least moderate dilatation of the ascending aorta based on limited views. Given association of aortic aneurysmal disease with bicuspid aortic valve, would suggest further dedicated imaging of the thoracic aorta with CTA or MRA. Normal PASP 29 mmHg.     05/2014 CTA chest IMPRESSION: Maximal diameter of the ascending aorta is 4.9 cm compare with 4.8 cm on the prior study. No evidence of dissection.   There are airspace and ground-glass opacities in the left upper lobe most consistent with an inflammatory process. There is also a 2 mm nodule. If the patient is at high risk for bronchogenic carcinoma, follow-up chest CT at 1 year is recommended. If the patient is at low risk, no follow-up is needed. This recommendation follows the consensus statement: Guidelines for Management of Small Pulmonary Nodules Detected on CT Scans: A Statement from the Fleischner Society as published in Radiology 2005; 237:395-400.     ADDENDUM: The patient therefore has a thoracic aortic aneurysm that has only increased from 4.8 cm to 4.9 cm since August of last year. Ascending thoracic aortic aneurysm. Recommend semi-annual imaging followup by CTA or MRA  and referral to cardiothoracic surgery if not already obtained. This recommendation follows 2010 ACCF/AHA/AATS/ACR/ASA/SCA/SCAI/SIR/STS/SVM Guidelines for the Diagnosis and Management of Patients With Thoracic Aortic Disease. Circulation. 2010; 121: [id number]     06/2014 Echo Study Conclusions  - Left ventricle: The cavity size was normal. Systolic function was   vigorous. The estimated ejection fraction was in the range of 65%   to 70%. Wall motion was normal; there were no regional wall  motion abnormalities. Features are consistent with a pseudonormal   left ventricular filling pattern, with concomitant abnormal   relaxation and increased filling pressure (grade 2 diastolic   dysfunction). Doppler parameters are consistent with high   ventricular filling pressure. Mild to moderate concentric left   ventricular hypertrophy. - Aortic valve: Mildly to moderately calcified annulus. Moderately   thickened, severely calcified leaflets. Cusp separation was   reduced. There was moderate to severe stenosis. There was mild   regurgitation. Peak velocity (S): 364 cm/s. Mean gradient: 32   mmHg. Valve area (Vmax): 0.91 cm^2. - Aorta: Ascending aortic diameter: 43 mm (S). Mild ascending   aortic dilatation. - Mitral valve: Mildly to moderately calcified annulus. There was   mild regurgitation.  Impressions:  - When compared to the report dated 10/02/13, the mean aortic valve   gradient has increased by 11 mmHg. Moderate to severe stenosis is   seen.   01/2015 CTA chest IMPRESSION: Dilatation of the thoracic aorta in its ascending component measuring 4.6 x 4.6 cm in greatest dimension. This is likely stable from the prior exam as some motion artifact was seen on the prior study.   09/2015 echo Study Conclusions   - Left ventricle: The cavity size was normal. Wall thickness was   increased in a pattern of moderate LVH. Systolic function was   normal. The estimated ejection fraction  was in the range of 60%   to 65%. Wall motion was normal; there were no regional wall   motion abnormalities. Features are consistent with a pseudonormal   left ventricular filling pattern, with concomitant abnormal   relaxation and increased filling pressure (grade 2 diastolic   dysfunction). Doppler parameters are consistent with high   ventricular filling pressure. - Aortic valve: Moderately thickened, severely calcified leaflets.   There was moderate to severe stenosis. There was mild   regurgitation. Peak velocity (S): 390 cm/s. Mean gradient (S): 34   mm Hg. Valve area (VTI): 1.01 cm^2. Valve area (Vmax): 0.89 cm^2.   Valve area (Vmean): 0.93 cm^2. - Aorta: Ascending aortic diameter: 46 mm (S). - Ascending aorta: The ascending aorta was mild to moderately   dilated. - Mitral valve: Calcified annulus. There was mild regurgitation. - Left atrium: The atrium was moderately dilated. - Right ventricle: Systolic function was mildly to moderately   reduced. - Tricuspid valve: There was mild-moderate regurgitation.   09/2015 Cath The left ventricular systolic function is normal. Nonobstructive CAD. Normal PA pressures. CI 2.3; PA sat 68%. Dilated ascending aorta.   Continue with plans for AVR and aneurysm repair  01/2021 Venous US IMPRESSION: No evidence of deep venous thrombosis in either lower extremity.    Assessment and Plan  1. Bicuspid aortic valve with aortic stenosis and aortic aneurysm - s/p tissue AVR and ascending aorta replacement - no symptoms, continue to monitor  2. HTN - isolated low bp' at home, monitor at this time - essentialyl at goal today, cotninue current meds  3. Hyperlipidemia - statin allergy. Discussed again zetia, wishes to give some thought. If open could check price to see if more affordable from a few years ago  when she last considered.    F/u 6 months      Arnoldo Lenis, [name]

## 2021-06-30 ENCOUNTER — Ambulatory Visit (INDEPENDENT_AMBULATORY_CARE_PROVIDER_SITE_OTHER): Payer: Medicare Other | Admitting: Nurse Practitioner

## 2021-06-30 ENCOUNTER — Encounter: Payer: Self-pay | Admitting: Nurse Practitioner

## 2021-06-30 VITALS — BP 142/80 | HR 60 | Temp 97.3°F | Resp 20 | Ht 59.0 in | Wt 229.0 lb

## 2021-06-30 DIAGNOSIS — Z952 Presence of prosthetic heart valve: Secondary | ICD-10-CM

## 2021-06-30 DIAGNOSIS — E782 Mixed hyperlipidemia: Secondary | ICD-10-CM

## 2021-06-30 DIAGNOSIS — I10 Essential (primary) hypertension: Secondary | ICD-10-CM

## 2021-06-30 MED ORDER — METOPROLOL TARTRATE 25 MG PO TABS: 25.0000 mg | ORAL_TABLET | Freq: Two times a day (BID) | ORAL | 1 refills | Status: DC

## 2021-06-30 MED ORDER — LISINOPRIL 20 MG PO TABS: 20.0000 mg | ORAL_TABLET | Freq: Every day | ORAL | 3 refills | Status: DC

## 2021-06-30 NOTE — Patient Instructions (Signed)

## 2021-06-30 NOTE — Progress Notes (Signed)
? ?Subjective:  ? ? Patient ID: Angela Dawson, female    DOB: [birth year], 76 y.o.   MRN: [id number] ? ? ?Chief Complaint: medical management of chronic issues  ?  ? ?HPI: ? ?Angela Dawson is a 76 y.o. who identifies as a female who was assigned female at birth.  ? ?Social history: ?Lives with: lives by herself with her dog ginger ?Work history: retired ? ? ?Comes in today for follow up of the following chronic medical issues: ? ?1. Primary hypertension ?No c/o chest  pain, sob or headache. Does not check blood pressure at home. ?BP Readings from Last 3 Encounters:  ?06/30/21 (!) 173/81  ?04/29/21 134/80  ?01/25/21 (!) 146/72  ? ? ? ? ?2. Mixed hyperlipidemia ?She does try to wathc her diet but does no dedicated exercise. She cannot tolerate a statin ?Lab Results  ?Component Value Date  ? CHOL 207 (H) 12/27/2020  ? HDL 47 12/27/2020  ? LDLCALC 136 (H) 12/27/2020  ? TRIG 136 12/27/2020  ? CHOLHDL 4.4 12/27/2020  ? ? ? ?3. Ascenending aorta dilitation ?Last CT angio was done in 2018. ? ?4. Severe obesity (BMI >= 40) (HCC) ?Weight is up 4lbs ?Wt Readings from Last 3 Encounters:  ?06/30/21 229 lb (103.9 kg)  ?04/29/21 225 lb 12.8 oz (102.4 kg)  ?01/25/21 225 lb 9.6 oz (102.3 kg)  ? ?BMI Readings from Last 3 Encounters:  ?06/30/21 46.25 kg/m?  ?04/29/21 45.61 kg/m?  ?01/25/21 45.57 kg/m?  ? ? ? ? ?New complaints: ?None today ? ?Allergies  ?Allergen Reactions  ? Crestor [Rosuvastatin]   ?  Dizziness  ? Lipitor [Atorvastatin]   ?  Unable to stand or move  ? Meloxicam   ?  Made patient dizzy and caused her heart to race  ? Statins   ?  Leg cramps   ? Penicillins Rash  ?  Has patient had a PCN reaction causing immediate rash, facial/tongue/throat swelling, SOB or lightheadedness with hypotension: Yes ?Has patient had a PCN reaction causing severe rash involving mucus membranes or skin necrosis: No ?Has patient had a PCN reaction that required hospitalization No ?Has patient had a PCN reaction occurring within the last 10  years: No ?If all of the above answers are "NO", then may proceed with Cephalosporin use. ?  ? ?Outpatient Encounter Medications as of 06/30/2021  ?Medication Sig  ? acetaminophen (TYLENOL) 325 MG tablet Take 2 tablets (650 mg total) by mouth every 6 (six) hours as needed for mild pain.  ? aspirin EC 81 MG tablet Take 1 tablet (81 mg total) by mouth daily.  ? Boswellia-Glucosamine-Vit D (OSTEO BI-FLEX ONE PER DAY PO) Take by mouth.  ? Calcium Carb-Cholecalciferol 600-800 MG-UNIT TABS Take 1 tablet by mouth daily.   ? cetirizine (ZYRTEC) 10 MG tablet TAKE 1 TABLET EVERY DAY  ? Cholecalciferol (VITAMIN D) 2000 units CAPS Take 2,000 Units by mouth daily.  ? lisinopril (ZESTRIL) 20 MG tablet Take 1 tablet (20 mg total) by mouth daily.  ? meloxicam (MOBIC) 15 MG tablet Take 7.5 mg by mouth daily.  ? metoprolol tartrate (LOPRESSOR) 25 MG tablet TAKE 1 TABLET TWICE DAILY  ? Red Yeast Rice 600 MG TABS Take 1 tablet by mouth daily.   ? ?No facility-administered encounter medications on file as of 06/30/2021.  ? ? ?Past Surgical History:  ?Procedure Laterality Date  ? AORTIC VALVE REPLACEMENT N/A 01/17/2016  ? Procedure: AORTIC VALVE REPLACEMENT (AVR) USING 21MM EDWARDS MAGNA EASE PERICARDIAL BIOPROSTHESIS VALVE;  Surgeon: Grace Isaac, MD;  Location: [facility];  Service: Open Heart Surgery;  Laterality: N/A;  ? CARDIAC CATHETERIZATION N/A 09/24/2015  ? Procedure: Right/Left Heart Cath and Coronary Angiography;  Surgeon: Jettie Booze, MD;  Location: [facility];  Service: Cardiovascular;  Laterality: N/A;  ? COLONOSCOPY    ? REPLACEMENT ASCENDING AORTA N/A 01/17/2016  ? Procedure: REPLACEMENT ASCENDING AORTA USING HEMASHIELD PLATINUM 28MM WOVEN DOUBLE VELOUR VASCULAR GRAFT;  Surgeon: Grace Isaac, MD;  Location: [facility];  Service: Open Heart Surgery;  Laterality: N/A;  ? TEE WITHOUT CARDIOVERSION N/A 01/17/2016  ? Procedure: TRANSESOPHAGEAL ECHOCARDIOGRAM (TEE);  Surgeon: Grace Isaac, MD;  Location: [facility];  Service: Open Heart Surgery;  Laterality: N/A;  ? TUBAL LIGATION    ? ? ?Family History  ?Problem Relation Age of Onset  ? Alzheimer's disease Mother   ? Heart disease Mother   ? Heart attack Father 57  ? Post-traumatic stress disorder Brother   ? ? ? ? ?Controlled substance contract: n/a ? ? ? ? ?Review of Systems  ?Constitutional:  Negative for diaphoresis.  ?Eyes:  Negative for pain.  ?Respiratory:  Negative for shortness of breath.   ?Cardiovascular:  Negative for chest pain, palpitations and leg swelling.  ?Gastrointestinal:  Negative for abdominal pain.  ?Endocrine: Negative for polydipsia.  ?Skin:  Negative for rash.  ?Neurological:  Negative for dizziness, weakness and headaches.  ?Hematological:  Does not bruise/bleed easily.  ?All other systems reviewed and are negative. ? ?   ?Objective:  ? Physical Exam ?Vitals and nursing note reviewed.  ?Constitutional:   ?   General: She is not in acute distress. ?   Appearance: Normal appearance. She is well-developed.  ?HENT:  ?   Head: Normocephalic.  ?   Right Ear: Tympanic membrane normal.  ?   Left Ear: Tympanic membrane normal.  ?   Nose: Nose normal.  ?   Mouth/Throat:  ?   Mouth: Mucous membranes are moist.  ?Eyes:  ?   Pupils: Pupils are equal, round, and reactive to light.  ?Neck:  ?   Vascular: No carotid bruit or JVD.  ?Cardiovascular:  ?   Rate and Rhythm: Normal rate and regular rhythm.  ?   Heart sounds: Normal heart sounds.  ?Pulmonary:  ?   Effort: Pulmonary effort is normal. No respiratory distress.  ?   Breath sounds: Normal breath sounds. No wheezing or rales.  ?Chest:  ?   Chest wall: No tenderness.  ?Abdominal:  ?   General: Bowel sounds are normal. There is no distension or abdominal bruit.  ?   Palpations: Abdomen is soft. There is no hepatomegaly, splenomegaly, mass or pulsatile mass.  ?   Tenderness: There is no abdominal tenderness.  ?Musculoskeletal:     ?   General: Normal range of motion.  ?   Cervical back: Normal range of motion  and neck supple.  ?Lymphadenopathy:  ?   Cervical: No cervical adenopathy.  ?Skin: ?   General: Skin is warm and dry.  ?Neurological:  ?   Mental Status: She is alert and oriented to person, place, and time.  ?   Deep Tendon Reflexes: Reflexes are normal and symmetric.  ?Psychiatric:     ?   Behavior: Behavior normal.     ?   Thought Content: Thought content normal.     ?   Judgment: Judgment normal.  ? ? ?BP (!) 142/80   Pulse 60  Temp (!) 97.3 ?F (36.3 ?C) (Temporal)   Resp 20   Ht 4' 11" (1.499 m)   Wt 229 lb (103.9 kg)   SpO2 93%   BMI 46.25 kg/m?  ? ? ? ? ?   ?Assessment & Plan:  ?Angela Dawson comes in today with chief complaint of Medical Management of Chronic Issues ? ? ?Diagnosis and orders addressed: ? ?1. Primary hypertension ?Low sodium diet ?- lisinopril (ZESTRIL) 20 MG tablet; Take 1 tablet (20 mg total) by mouth daily.  Dispense: 90 tablet; Refill: 3 ?- CBC with Differential/Platelet ?- CMP14+EGFR ? ?2. Mixed hyperlipidemia ?Low fat diet ?- Lipid panel ? ?3. Severe obesity (BMI >= 40) (HCC) ?Discussed diet and exercise for person with BMI >25 ?Will recheck weight in 3-6 months ? ? ?4. S/P AVR (aortic valve replacement) ?Keep followup with cardiology ?- metoprolol tartrate (LOPRESSOR) 25 MG tablet; Take 1 tablet (25 mg total) by mouth 2 (two) times daily.  Dispense: 180 tablet; Refill: 1 ? ? ?Labs pending ?Health Maintenance reviewed ?Diet and exercise encouraged ? ?Follow up plan: ?6 months ? ? ?Mary-Margaret Hassell Done, FNP ? ? ?

## 2021-07-01 LAB — CMP14+EGFR
ALT: 13 IU/L (ref 0–32)
AST: 14 IU/L (ref 0–40)
Albumin/Globulin Ratio: 2 (ref 1.2–2.2)
Albumin: 4.4 g/dL (ref 3.7–4.7)
Alkaline Phosphatase: 72 IU/L (ref 44–121)
BUN/Creatinine Ratio: 22 (ref 12–28)
BUN: 21 mg/dL (ref 8–27)
Bilirubin Total: 0.3 mg/dL (ref 0.0–1.2)
CO2: 24 mmol/L (ref 20–29)
Calcium: 9.4 mg/dL (ref 8.7–10.3)
Chloride: 104 mmol/L (ref 96–106)
Creatinine, Ser: 0.95 mg/dL (ref 0.57–1.00)
Globulin, Total: 2.2 g/dL (ref 1.5–4.5)
Glucose: 98 mg/dL (ref 70–99)
Potassium: 4.8 mmol/L (ref 3.5–5.2)
Sodium: 143 mmol/L (ref 134–144)
Total Protein: 6.6 g/dL (ref 6.0–8.5)
eGFR: 63 mL/min/{1.73_m2} (ref >59)

## 2021-07-01 LAB — LIPID PANEL
Chol/HDL Ratio: 4 ratio (ref 0.0–4.4)
Cholesterol, Total: 197 mg/dL (ref 100–199)
HDL: 49 mg/dL (ref >39)
LDL Chol Calc (NIH): 130 mg/dL — ABNORMAL HIGH (ref 0–99)
Triglycerides: 97 mg/dL (ref 0–149)
VLDL Cholesterol Cal: 18 mg/dL (ref 5–40)

## 2021-07-01 LAB — CBC WITH DIFFERENTIAL/PLATELET
Basophils Absolute: 0 10*3/uL (ref 0.0–0.2)
Basos: 1 %
EOS (ABSOLUTE): 0.3 10*3/uL (ref 0.0–0.4)
Eos: 3 %
Hematocrit: 42.3 % (ref 34.0–46.6)
Hemoglobin: 14.8 g/dL (ref 11.1–15.9)
Immature Grans (Abs): 0.1 10*3/uL (ref 0.0–0.1)
Immature Granulocytes: 1 %
Lymphocytes Absolute: 3.9 10*3/uL — ABNORMAL HIGH (ref 0.7–3.1)
Lymphs: 50 %
MCH: 33.3 pg — ABNORMAL HIGH (ref 26.6–33.0)
MCHC: 35 g/dL (ref 31.5–35.7)
MCV: 95 fL (ref 79–97)
Monocytes Absolute: 0.6 10*3/uL (ref 0.1–0.9)
Monocytes: 7 %
Neutrophils Absolute: 2.9 10*3/uL (ref 1.4–7.0)
Neutrophils: 38 %
Platelets: 242 10*3/uL (ref 150–450)
RBC: 4.45 x10E6/uL (ref 3.77–5.28)
RDW: 12.3 % (ref 11.7–15.4)
WBC: 7.7 10*3/uL (ref 3.4–10.8)

## 2021-08-25 ENCOUNTER — Other Ambulatory Visit: Payer: Self-pay | Admitting: Nurse Practitioner

## 2021-08-26 ENCOUNTER — Other Ambulatory Visit: Payer: Self-pay | Admitting: *Deleted

## 2021-08-29 MED ORDER — MELOXICAM 15 MG PO TABS: 7.5000 mg | ORAL_TABLET | Freq: Every day | ORAL | 1 refills | Status: DC

## 2021-10-31 ENCOUNTER — Encounter: Payer: Self-pay | Admitting: Cardiology

## 2021-10-31 ENCOUNTER — Ambulatory Visit: Payer: Medicare Other | Attending: Cardiology | Admitting: Cardiology

## 2021-10-31 VITALS — BP 140/90 | HR 64 | Ht 60.0 in | Wt 231.0 lb

## 2021-10-31 DIAGNOSIS — I10 Essential (primary) hypertension: Secondary | ICD-10-CM | POA: Diagnosis not present

## 2021-10-31 DIAGNOSIS — I499 Cardiac arrhythmia, unspecified: Secondary | ICD-10-CM

## 2021-10-31 DIAGNOSIS — E782 Mixed hyperlipidemia: Secondary | ICD-10-CM | POA: Diagnosis not present

## 2021-10-31 DIAGNOSIS — Z952 Presence of prosthetic heart valve: Secondary | ICD-10-CM

## 2021-10-31 NOTE — Patient Instructions (Signed)

## 2021-10-31 NOTE — Progress Notes (Signed)
Clinical Summary Angela Dawson is a 76 y.o.female seen today for follow up of the following medical problems.    1. Aortic stenosis with bicuspid AV and ascending aortic aneurysm.   - 09/2013 echo showed LVEF 65-70%, grade I diastolic dysfunction. Bicuspid AV with moderate to severe AS (mean grad 21, dimensionless index 0.32, valve area 0.93), mild AI. Described mild aorta dilatation from limited views, ascending aorta 4.6 cm. - CTA chest 05/2014 with asscending thoracic aortic aneurysm 4.9 cm.   01/2015 CTA chest ascending aortic aneurysm 4.6 x 4.6 cm.  Forllowed by Dr Tyrone Sage.  -06/2014 echo LVEF 65-70%, AVA area 0.91, mean grad 32 - 09/2015 echo LVEF 60-65%, mod to severe AS mean grad 34, AVA 1 -09/2015 cath which showed no significant CAD - 05/2016 echo:  normal AVR   -she is s/p AVR with [employer] Lifesciene pericardial tissue valve 87mm and supracoronary replacement of the ascending aorta on 01/18/16     - since COVID reports some chronic SOB. Chronic arthritis pains limits exertion, more sedentary as well with some deconditioning.      2.HTN -has had some white coat HTN  - compliant with meds - home bp's 120s/80s     3. Hyperlipidemia - allergic to statins. - 10/2016 TC 206 TG 101 HDL 51 LDL 135  - zetia is too expensive, and more recent has not been interested.    - has been working on diet control, pcp started red yeast rice    12/2020 TC 207 TG 136 HDL 47 LDL 136 - not in favor of zetia.    06/2021 TC 197 TG 97 HDL 49 LDL 130 - remains not interested in zetia      Past Medical History:  Diagnosis Date   Allergy    seasonal   Chicken pox    Complication of anesthesia    difficult to wake up after tubal ligations   Dyspnea    with exertion   Heart murmur    History of bronchitis    History of kidney stones    "only one"   Hypertension    Measles      Allergies  Allergen Reactions   Crestor [Rosuvastatin]     Dizziness   Lipitor [Atorvastatin]      Unable to stand or move   Meloxicam     Made patient dizzy and caused her heart to race   Statins     Leg cramps    Penicillins Rash    Has patient had a PCN reaction causing immediate rash, facial/tongue/throat swelling, SOB or lightheadedness with hypotension: Yes Has patient had a PCN reaction causing severe rash involving mucus membranes or skin necrosis: No Has patient had a PCN reaction that required hospitalization No Has patient had a PCN reaction occurring within the last 10 years: No If all of the above answers are "NO", then may proceed with Cephalosporin use.      Current Outpatient Medications  Medication Sig Dispense Refill   acetaminophen (TYLENOL) 325 MG tablet Take 2 tablets (650 mg total) by mouth every 6 (six) hours as needed for mild pain.     aspirin EC 81 MG tablet Take 1 tablet (81 mg total) by mouth daily. 90 tablet 3   Calcium Carb-Cholecalciferol 600-800 MG-UNIT TABS Take 1 tablet by mouth daily.      cetirizine (ZYRTEC) 10 MG tablet TAKE 1 TABLET EVERY DAY 90 tablet 1   Cholecalciferol (VITAMIN D) 2000 units CAPS Take  2,000 Units by mouth daily.     lisinopril (ZESTRIL) 20 MG tablet Take 1 tablet (20 mg total) by mouth daily. 90 tablet 3   meloxicam (MOBIC) 15 MG tablet Take 0.5 tablets (7.5 mg total) by mouth daily. 30 tablet 1   metoprolol tartrate (LOPRESSOR) 25 MG tablet Take 1 tablet (25 mg total) by mouth 2 (two) times daily. 180 tablet 1   Red Yeast Rice 600 MG TABS Take 1 tablet by mouth daily.      No current facility-administered medications for this visit.     Past Surgical History:  Procedure Laterality Date   AORTIC VALVE REPLACEMENT N/A 01/17/2016   Procedure: AORTIC VALVE REPLACEMENT (AVR) USING EDWARDS MAGNA EASE PERICARDIAL BIOPROSTHESIS VALVE;  Surgeon: Delight Ovens, MD;  Location: [facility] OR;  Service: Open Heart Surgery;  Laterality: N/A;   CARDIAC CATHETERIZATION N/A 09/24/2015   Procedure: Right/Left Heart Cath and Coronary  Angiography;  Surgeon: Corky Crafts, MD;  Location: [facility] INVASIVE CV LAB;  Service: Cardiovascular;  Laterality: N/A;   COLONOSCOPY     REPLACEMENT ASCENDING AORTA N/A 01/17/2016   Procedure: REPLACEMENT ASCENDING AORTA USING HEMASHIELD PLATINUM WOVEN DOUBLE VELOUR VASCULAR GRAFT;  Surgeon: Delight Ovens, MD;  Location: [facility] OR;  Service: Open Heart Surgery;  Laterality: N/A;   TEE WITHOUT CARDIOVERSION N/A 01/17/2016   Procedure: TRANSESOPHAGEAL ECHOCARDIOGRAM (TEE);  Surgeon: Delight Ovens, MD;  Location: [facility] OR;  Service: Open Heart Surgery;  Laterality: N/A;   TUBAL LIGATION       Allergies  Allergen Reactions   Crestor [Rosuvastatin]     Dizziness   Lipitor [Atorvastatin]     Unable to stand or move   Meloxicam     Made patient dizzy and caused her heart to race   Statins     Leg cramps    Penicillins Rash    Has patient had a PCN reaction causing immediate rash, facial/tongue/throat swelling, SOB or lightheadedness with hypotension: Yes Has patient had a PCN reaction causing severe rash involving mucus membranes or skin necrosis: No Has patient had a PCN reaction that required hospitalization No Has patient had a PCN reaction occurring within the last 10 years: No If all of the above answers are "NO", then may proceed with Cephalosporin use.       Family History  Problem Relation Age of Onset   Alzheimer's disease Mother    Heart disease Mother    Heart attack Father 12   Post-traumatic stress disorder Brother      Social History Ms. Marken reports that she quit smoking about 48 years ago. Her smoking use included cigarettes. She started smoking about 53 years ago. She has a 2.50 pack-year smoking history. She has never used smokeless tobacco. Ms. Lippman reports no history of alcohol use.   Review of Systems CONSTITUTIONAL: No weight loss, fever, chills, weakness or fatigue.  HEENT: Eyes: No visual loss, blurred vision, double vision or yellow  sclerae.No hearing loss, sneezing, congestion, runny nose or sore throat.  SKIN: No rash or itching.  CARDIOVASCULAR: per hpi RESPIRATORY: No shortness of breath, cough or sputum.  GASTROINTESTINAL: No anorexia, nausea, vomiting or diarrhea. No abdominal pain or blood.  GENITOURINARY: No burning on urination, no polyuria NEUROLOGICAL: No headache, dizziness, syncope, paralysis, ataxia, numbness or tingling in the extremities. No change in bowel or bladder control.  MUSCULOSKELETAL: No muscle, back pain, joint pain or stiffness.  LYMPHATICS: No enlarged nodes. No history of splenectomy.  PSYCHIATRIC:  No history of depression or anxiety.  ENDOCRINOLOGIC: No reports of sweating, cold or heat intolerance. No polyuria or polydipsia.  Marland Kitchen   Physical Examination Today's Vitals   10/31/21 1358  BP: (!) 138/92  Pulse: 64  SpO2: 93%  Weight: 231 lb (104.8 kg)  Height: 5' (1.524 m)   Body mass index is 45.11 kg/m.  Gen: resting comfortably, no acute distress HEENT: no scleral icterus, pupils equal round and reactive, no palptable cervical adenopathy,  CV: irreg, 2/6 systolic murmu rusb, no jvd Resp: Clear to auscultation bilaterally GI: abdomen is soft, non-tender, non-distended, normal bowel sounds, no hepatosplenomegaly MSK: extremities are warm, no edema.  Skin: warm, no rash Neuro:  no focal deficits Psych: appropriate affect   Diagnostic Studies  09/2013 Echo Study Conclusions  - Left ventricle: The cavity size was normal. Wall thickness was increased in a pattern of mild LVH. Systolic function was vigorous. The estimated ejection fraction was in the range of 65% to 70%. Wall motion was normal; there were no regional wall motion abnormalities. Doppler parameters are consistent with abnormal left ventricular relaxation (grade 1 diastolic dysfunction). - Aortic valve: Bicuspid; moderately calcified leaflets. Cusp separation was reduced. There was moderate to severe  stenosis. There was mild regurgitation. Mean gradient (S): 21 mm Hg. LVOT/AV VTI ratio 0.32. Peak gradient (S): 40 mm Hg. Valve area (VTI): 0.93 cm^2. Valve area (Vmax): 0.96 cm^2. - Ascending aorta: The ascending aorta was moderately dilated. Approximately 46 mm. - Mitral valve: Calcified annulus. There was trivial regurgitation. - Right atrium: Central venous pressure (est): 3 mm Hg. - Atrial septum: No defect or patent foramen ovale was identified. - Tricuspid valve: There was trivial regurgitation. - Pulmonary arteries: PA peak pressure: 29 mm Hg (S). - Pericardium, extracardiac: There was no pericardial effusion.  Impressions:  - Mild LVH with LVEF 65-70%, grade 1 diastolic dysfunction. Moderate to severe aortic stenosis as outlined with bicuspid aortic valve and mild aortic regurgitation. There is also at least moderate dilatation of the ascending aorta based on limited views. Given association of aortic aneurysmal disease with bicuspid aortic valve, would suggest further dedicated imaging of the thoracic aorta with CTA or MRA. Normal PASP 29 mmHg.     05/2014 CTA chest IMPRESSION: Maximal diameter of the ascending aorta is 4.9 cm compare with 4.8 cm on the prior study. No evidence of dissection.   There are airspace and ground-glass opacities in the left upper lobe most consistent with an inflammatory process. There is also a 2 mm nodule. If the patient is at high risk for bronchogenic carcinoma, follow-up chest CT at 1 year is recommended. If the patient is at low risk, no follow-up is needed. This recommendation follows the consensus statement: Guidelines for Management of Small Pulmonary Nodules Detected on CT Scans: A Statement from the Fleischner Society as published in Radiology 2005; 237:395-400.     ADDENDUM: The patient therefore has a thoracic aortic aneurysm that has only increased from 4.8 cm to 4.9 cm since August of last year. Ascending thoracic aortic  aneurysm. Recommend semi-annual imaging followup by CTA or MRA and referral to cardiothoracic surgery if not already obtained. This recommendation follows 2010 ACCF/AHA/AATS/ACR/ASA/SCA/SCAI/SIR/STS/SVM Guidelines for the Diagnosis and Management of Patients With Thoracic Aortic Disease. Circulation. 2010; 121: D782-U235     06/2014 Echo Study Conclusions  - Left ventricle: The cavity size was normal. Systolic function was   vigorous. The estimated ejection fraction was in the range of 65%   to 70%. Wall  motion was normal; there were no regional wall   motion abnormalities. Features are consistent with a pseudonormal   left ventricular filling pattern, with concomitant abnormal   relaxation and increased filling pressure (grade 2 diastolic   dysfunction). Doppler parameters are consistent with high   ventricular filling pressure. Mild to moderate concentric left   ventricular hypertrophy. - Aortic valve: Mildly to moderately calcified annulus. Moderately   thickened, severely calcified leaflets. Cusp separation was   reduced. There was moderate to severe stenosis. There was mild   regurgitation. Peak velocity (S): 364 cm/s. Mean gradient: 32   mmHg. Valve area (Vmax): 0.91 cm^2. - Aorta: Ascending aortic diameter: 43 mm (S). Mild ascending   aortic dilatation. - Mitral valve: Mildly to moderately calcified annulus. There was   mild regurgitation.  Impressions:  - When compared to the report dated 10/02/13, the mean aortic valve   gradient has increased by 11 mmHg. Moderate to severe stenosis is   seen.   01/2015 CTA chest IMPRESSION: Dilatation of the thoracic aorta in its ascending component measuring 4.6 x 4.6 cm in greatest dimension. This is likely stable from the prior exam as some motion artifact was seen on the prior study.   09/2015 echo Study Conclusions   - Left ventricle: The cavity size was normal. Wall thickness was   increased in a pattern of moderate LVH.  Systolic function was   normal. The estimated ejection fraction was in the range of 60%   to 65%. Wall motion was normal; there were no regional wall   motion abnormalities. Features are consistent with a pseudonormal   left ventricular filling pattern, with concomitant abnormal   relaxation and increased filling pressure (grade 2 diastolic   dysfunction). Doppler parameters are consistent with high   ventricular filling pressure. - Aortic valve: Moderately thickened, severely calcified leaflets.   There was moderate to severe stenosis. There was mild   regurgitation. Peak velocity (S): 390 cm/s. Mean gradient (S): 34   mm Hg. Valve area (VTI): 1.01 cm^2. Valve area (Vmax): 0.89 cm^2.   Valve area (Vmean): 0.93 cm^2. - Aorta: Ascending aortic diameter: 46 mm (S). - Ascending aorta: The ascending aorta was mild to moderately   dilated. - Mitral valve: Calcified annulus. There was mild regurgitation. - Left atrium: The atrium was moderately dilated. - Right ventricle: Systolic function was mildly to moderately   reduced. - Tricuspid valve: There was mild-moderate regurgitation.   09/2015 Cath The left ventricular systolic function is normal. Nonobstructive CAD. Normal PA pressures. CI 2.3; PA sat 68%. Dilated ascending aorta.   Continue with plans for AVR and aneurysm repair   01/2021 Venous US IMPRESSION: No evidence of deep venous thrombosis in either lower extremity.       Assessment and Plan  1. Bicuspid aortic valve with aortic stenosis and aortic aneurysm - s/p tissue AVR and ascending aorta replacement - doing well nearly 6 years our from surgery, no significant symptoms - continue to monitor.    2. HTN - bp elevated here, home bp's are at goal. This is her normal pattern - continue curren tmeds   3. Hyperlipidemia - statin allergy. Has not been interested in statin alternatives.     F/u 1 year  Antoine Poche, [name],

## 2021-11-08 ENCOUNTER — Ambulatory Visit (INDEPENDENT_AMBULATORY_CARE_PROVIDER_SITE_OTHER): Payer: Medicare Other

## 2021-11-08 ENCOUNTER — Other Ambulatory Visit: Payer: Self-pay | Admitting: Nurse Practitioner

## 2021-11-08 VITALS — Wt 231.0 lb

## 2021-11-08 DIAGNOSIS — Z0000 Encounter for general adult medical examination without abnormal findings: Secondary | ICD-10-CM | POA: Diagnosis not present

## 2021-11-08 DIAGNOSIS — Z1231 Encounter for screening mammogram for malignant neoplasm of breast: Secondary | ICD-10-CM

## 2021-11-08 NOTE — Progress Notes (Signed)
Subjective:   Angela Dawson is a 76 y.o. female who presents for Medicare Annual (Subsequent) preventive examination.  Virtual Visit via Telephone Note  I connected with  Angela Dawson on 11/08/21 at  2:45 PM EDT by telephone and verified that I am speaking with the correct person using two identifiers.  Location: Patient: Home Provider: [facility] Persons participating in the virtual visit: patient/Nurse Health Advisor   I discussed the limitations, risks, security and privacy concerns of performing an evaluation and management service by telephone and the availability of in person appointments. The patient expressed understanding and agreed to proceed.  Interactive audio and video telecommunications were attempted between this nurse and patient, however failed, due to patient having technical difficulties OR patient did not have access to video capability.  We continued and completed visit with audio only.  Some vital signs may be absent or patient reported.   Shaeley Segall E Shawndell Varas, LPN   Review of Systems     Cardiac Risk Factors include: advanced age (>20men, >3 women);dyslipidemia;hypertension;sedentary lifestyle;obesity (BMI >30kg/m2)     Objective:    Today's Vitals   11/08/21 1449  Weight: 231 lb (104.8 kg)  PainSc: 5    Body mass index is 45.11 kg/m.     11/08/2021    2:57 PM 11/04/2020    4:08 PM 10/27/2019    1:31 PM 10/16/2018    3:37 PM 05/15/2017    3:26 PM 04/13/2016    2:46 PM 01/20/2016    3:00 AM  Advanced Directives  Does Patient Have a Medical Advance Directive? Yes Yes No No Yes Yes No  Type of Estate agent of [place];Living will Healthcare Power of [place];Living will   Healthcare Power of [place];Living will Healthcare Power of Attorney   Does patient want to make changes to medical advance directive? No - Patient declined    No - Patient declined    Copy of Healthcare Power of Attorney in Chart? Yes - validated most recent copy scanned  in chart (See row information) No - copy requested   Yes Yes   Would patient like information on creating a medical advance directive?   No - Patient declined Yes (MAU/Ambulatory/Procedural Areas - Information given)       Current Medications (verified) Outpatient Encounter Medications as of 11/08/2021  Medication Sig   acetaminophen (TYLENOL) 325 MG tablet Take 2 tablets (650 mg total) by mouth every 6 (six) hours as needed for mild pain.   aspirin EC 81 MG tablet Take 1 tablet (81 mg total) by mouth daily.   Calcium Carb-Cholecalciferol 600-800 MG-UNIT TABS Take 1 tablet by mouth daily.    cetirizine (ZYRTEC) 10 MG tablet TAKE 1 TABLET EVERY DAY   Cholecalciferol (VITAMIN D) 2000 units CAPS Take 2,000 Units by mouth daily.   lisinopril (ZESTRIL) 20 MG tablet Take 1 tablet (20 mg total) by mouth daily.   meloxicam (MOBIC) 15 MG tablet Take 0.5 tablets (7.5 mg total) by mouth daily.   metoprolol tartrate (LOPRESSOR) 25 MG tablet Take 1 tablet (25 mg total) by mouth 2 (two) times daily.   Red Yeast Rice 600 MG TABS Take 1 tablet by mouth daily.    No facility-administered encounter medications on file as of 11/08/2021.    Allergies (verified) Crestor [rosuvastatin], Lipitor [atorvastatin], Meloxicam, Statins, and Penicillins   History: Past Medical History:  Diagnosis Date   Allergy    seasonal   Chicken pox    Complication of anesthesia  difficult to wake up after tubal ligations   Dyspnea    with exertion   Heart murmur    History of bronchitis    History of kidney stones    "only one"   Hypertension    Measles    Past Surgical History:  Procedure Laterality Date   AORTIC VALVE REPLACEMENT N/A 01/17/2016   Procedure: AORTIC VALVE REPLACEMENT (AVR) USING EDWARDS MAGNA EASE PERICARDIAL BIOPROSTHESIS VALVE;  Surgeon: Delight Ovens, MD;  Location: [facility] OR;  Service: Open Heart Surgery;  Laterality: N/A;   CARDIAC CATHETERIZATION N/A 09/24/2015   Procedure: Right/Left  Heart Cath and Coronary Angiography;  Surgeon: Corky Crafts, MD;  Location: [facility] INVASIVE CV LAB;  Service: Cardiovascular;  Laterality: N/A;   COLONOSCOPY     REPLACEMENT ASCENDING AORTA N/A 01/17/2016   Procedure: REPLACEMENT ASCENDING AORTA USING HEMASHIELD PLATINUM WOVEN DOUBLE VELOUR VASCULAR GRAFT;  Surgeon: Delight Ovens, MD;  Location: [facility] OR;  Service: Open Heart Surgery;  Laterality: N/A;   TEE WITHOUT CARDIOVERSION N/A 01/17/2016   Procedure: TRANSESOPHAGEAL ECHOCARDIOGRAM (TEE);  Surgeon: Delight Ovens, MD;  Location: [facility] OR;  Service: Open Heart Surgery;  Laterality: N/A;   TUBAL LIGATION     Family History  Problem Relation Age of Onset   Alzheimer's disease Mother    Heart disease Mother    Heart attack Father 49   Post-traumatic stress disorder Brother    Social History   Socioeconomic History   Marital status: Widowed    Spouse name: Not on file   Number of children: 1   Years of education: business school   Highest education level: Bachelor's degree (e.g., BA, AB, BS)  Occupational History   Occupation: retired    Associate Professor: [employer]   Occupation: Retired    Comment: Biochemist, clinical  Tobacco Use   Smoking status: Former    Packs/day: 0.50    Years: 5.00    Total pack years: 2.50    Types: Cigarettes    Start date: 08/04/1968    Quit date: 10/10/1973    Years since quitting: 48.1   Smokeless tobacco: Never   Tobacco comments:    smoked less than 1 pack every 2 weeks. Havent smoked in atleast 40 years  Vaping Use   Vaping Use: Never used  Substance and Sexual Activity   Alcohol use: No    Alcohol/week: 0.0 standard drinks of alcohol   Drug use: No   Sexual activity: Not Currently  Other Topics Concern   Not on file  Social History Narrative   Lives alone. Pt states son lives in [place].    Social Determinants of Health   Financial Resource Strain: Low Risk  (11/08/2021)   Overall Financial Resource Strain (CARDIA)    Difficulty  of Paying Living Expenses: Not hard at all  Food Insecurity: No Food Insecurity (11/08/2021)   Hunger Vital Sign    Worried About Running Out of Food in the Last Year: Never true    Ran Out of Food in the Last Year: Never true  Transportation Needs: No Transportation Needs (11/08/2021)   PRAPARE - Administrator, Civil Service (Medical): No    Lack of Transportation (Non-Medical): No  Physical Activity: Insufficiently Active (11/08/2021)   Exercise Vital Sign    Days of Exercise per Week: 7 days    Minutes of Exercise per Session: 20 min  Stress: No Stress Concern Present (11/08/2021)   [employer] of Occupational Health -  Occupational Stress Questionnaire    Feeling of Stress : Not at all  Social Connections: Moderately Isolated (11/08/2021)   Social Connection and Isolation Panel [NHANES]    Frequency of Communication with Friends and Family: More than three times a week    Frequency of Social Gatherings with Friends and Family: More than three times a week    Attends Religious Services: Never    Database administrator or Organizations: Yes    Attends Engineer, structural: More than 4 times per year    Marital Status: Widowed    Tobacco Counseling Counseling given: Not Answered Tobacco comments: smoked less than 1 pack every 2 weeks. Havent smoked in atleast 40 years   Clinical Intake:  Pre-visit preparation completed: Yes  Pain : 0-10 Pain Score: 5  Pain Type: Chronic pain Pain Location: Generalized Pain Descriptors / Indicators: Aching, Discomfort Pain Onset: More than a month ago Pain Frequency: Intermittent     BMI - recorded: 45.11 Nutritional Status: BMI > 30  Obese Nutritional Risks: None Diabetes: No  How often do you need to have someone help you when you read instructions, pamphlets, or other written materials from your doctor or pharmacy?: 1 - Never  Diabetic?  NO  Interpreter Needed?: No  Information entered by :: Lynnann Knudsen,  LPN   Activities of Daily Living    11/08/2021    2:58 PM  In your present state of health, do you have any difficulty performing the following activities:  Hearing? 0  Vision? 0  Difficulty concentrating or making decisions? 0  Walking or climbing stairs? 1  Dressing or bathing? 0  Doing errands, shopping? 0  Preparing Food and eating ? N  Using the Toilet? N  In the past six months, have you accidently leaked urine? Y  Comment worse at night  Do you have problems with loss of bowel control? N  Managing your Medications? N  Managing your Finances? N  Housekeeping or managing your Housekeeping? N    Patient Care Team: Bennie Pierini, FNP as PCP - General (Family Medicine) Wyline Mood Dorothe Pea, MD as PCP - Cardiology (Cardiology) Delight Ovens, MD (Inactive) as Consulting Physician (Cardiothoracic Surgery)  Indicate any recent Medical Services you may have received from other than [facility] providers in the past year (date may be approximate).     Assessment:   This is a routine wellness examination for [place].  Hearing/Vision screen Hearing Screening - Comments:: Denies hearing difficulties   Vision Screening - Comments:: Wears reading glasses prn - no optometrist   Dietary issues and exercise activities discussed: Current Exercise Habits: Home exercise routine, Type of exercise: stretching;strength training/weights, Time (Minutes): 15, Frequency (Times/Week): 7, Weekly Exercise (Minutes/Week): 105, Intensity: Mild, Exercise limited by: orthopedic condition(s)   Goals Addressed             This Visit's Progress    Exercise 150 min/wk Moderate Activity   On track    Pt states she does upper body exercises because of her hip pain.  11/08/21 - hopes to maintain current level of health       Depression Screen    11/08/2021    3:04 PM 06/30/2021    2:04 PM 12/27/2020    2:00 PM 11/04/2020    3:58 PM 04/27/2020    2:05 PM 10/27/2019    1:26 PM 10/03/2019   11:52 AM   PHQ 2/9 Scores  PHQ - 2 Score 0 0 0 0 0 0 0  PHQ- 9 Score  1 0        Fall Risk    11/08/2021    2:52 PM 06/30/2021    2:04 PM 12/27/2020    2:00 PM 11/04/2020    4:09 PM 04/27/2020    2:05 PM  Fall Risk   Falls in the past year? 0 0 1 1 0  Comment    Pt states she had taken dog out and floor was wet from rain and she slipped.   Number falls in past yr: 0  0 0   Injury with Fall? 0  0 0   Comment    Bruises and scrapes.   Risk for fall due to : Orthopedic patient  History of fall(s) History of fall(s);Orthopedic patient   Follow up Falls prevention discussed;Education provided  Education provided Falls prevention discussed     FALL RISK PREVENTION PERTAINING TO THE HOME:  Any stairs in or around the home? Yes  If so, are there any without handrails? Yes  - just a few in front - she keeps a stick there to use Home free of loose throw rugs in walkways, pet beds, electrical cords, etc? No  Adequate lighting in your home to reduce risk of falls? No   ASSISTIVE DEVICES UTILIZED TO PREVENT FALLS:  Life alert? No  Use of a cane, walker or w/c? No  Grab bars in the bathroom? Yes  Shower chair or bench in shower? No  Elevated toilet seat or a handicapped toilet? No   TIMED UP AND GO:  Was the test performed? No . Telephonic visit  Cognitive Function:    05/15/2017    3:37 PM 10/11/2015   10:37 AM  MMSE - Mini Mental State Exam  Orientation to time 5 5  Orientation to Place 5 5  Registration 3 3  Attention/ Calculation 5 5  Recall 3 3  Language- name 2 objects 2 2  Language- repeat 1 1  Language- follow 3 step command 3 3  Language- read & follow direction 1 1  Write a sentence 1 1  Copy design 1 1  Total score 30 30        11/08/2021    2:59 PM 10/27/2019    1:35 PM 10/16/2018    3:38 PM  6CIT Screen  What Year? 0 points 0 points 0 points  What month? 0 points 0 points 0 points  What time? 0 points 0 points 0 points  Count back from 20 0 points 0 points 0 points   Months in reverse 0 points 2 points 0 points  Repeat phrase 0 points 0 points 0 points  Total Score 0 points 2 points 0 points    Immunizations Immunization History  Administered Date(s) Administered   Fluad Quad(high Dose 65+) 12/02/2018, 02/05/2020, 12/27/2020   Influenza, High Dose Seasonal PF 12/01/2015, 03/20/2017, 01/24/2018   Moderna Sars-Covid-2 Vaccination 04/15/2019, 05/13/2019, 01/21/2020   Pneumococcal Conjugate-13 10/11/2015   Pneumococcal Polysaccharide-23 03/20/2017    TDAP status: Due, Education has been provided regarding the importance of this vaccine. Advised may receive this vaccine at local pharmacy or Health Dept. Aware to provide a copy of the vaccination record if obtained from local pharmacy or Health Dept. Verbalized acceptance and understanding.  Flu Vaccine status: Up to date  Pneumococcal vaccine status: Up to date  Covid-19 vaccine status: Completed vaccines  Qualifies for Shingles Vaccine? Yes   Zostavax completed No   Shingrix Completed?: No.    Education has been  provided regarding the importance of this vaccine. Patient has been advised to call insurance company to determine out of pocket expense if they have not yet received this vaccine. Advised may also receive vaccine at local pharmacy or Health Dept. Verbalized acceptance and understanding.  Screening Tests Health Maintenance  Topic Date Due   Zoster Vaccines- Shingrix (1 of 2) Never done   COVID-19 Vaccine (4 - Moderna series) 03/17/2020   MAMMOGRAM  04/14/2020   INFLUENZA VACCINE  10/04/2021   TETANUS/TDAP  12/27/2021 (Originally 07/17/2017)   COLONOSCOPY (Pts 45-41yrs Insurance coverage will need to be confirmed)  11/10/2021   DEXA SCAN  04/02/2022   Pneumonia Vaccine 25+ Years old  Completed   Hepatitis C Screening  Completed   HPV VACCINES  Aged Out    Health Maintenance  Health Maintenance Due  Topic Date Due   Zoster Vaccines- Shingrix (1 of 2) Never done   COVID-19 Vaccine  (4 - Moderna series) 03/17/2020   MAMMOGRAM  04/14/2020   INFLUENZA VACCINE  10/04/2021    Colorectal cancer screening: Type of screening: Colonoscopy. Completed 11/11/2011. Repeat every 10 years - She wants to wait and discuss with Gennette Pac  Mammogram status: Ordered 11/08/2021. Pt provided with contact info and advised to call to schedule appt.   Bone Density status: Completed 04/02/2017. Results reflect: Bone density results: NORMAL. Repeat every 5 years.  Lung Cancer Screening: (Low Dose CT Chest recommended if Age 33-80 years, 30 pack-year currently smoking OR have quit w/in 15years.) does not qualify.  Additional Screening:  Hepatitis C Screening: does qualify; Completed 02/15/2015  Vision Screening: Recommended annual ophthalmology exams for early detection of glaucoma and other disorders of the eye. Is the patient up to date with their annual eye exam?  No  Who is the provider or what is the name of the office in which the patient attends annual eye exams? none If pt is not established with a provider, would they like to be referred to a provider to establish care? No .   Dental Screening: Recommended annual dental exams for proper oral hygiene  Community Resource Referral / Chronic Care Management: CRR required this visit?  No   CCM required this visit?  No      Plan:     I have personally reviewed and noted the following in the patient's chart:   Medical and social history Use of alcohol, tobacco or illicit drugs  Current medications and supplements including opioid prescriptions. Patient is not currently taking opioid prescriptions. Functional ability and status Nutritional status Physical activity Advanced directives List of other physicians Hospitalizations, surgeries, and ER visits in previous 12 months Vitals Screenings to include cognitive, depression, and falls Referrals and appointments  In addition, I have reviewed and discussed with patient certain  preventive protocols, quality metrics, and best practice recommendations. A written personalized care plan for preventive services as well as general preventive health recommendations were provided to patient.     [place] Constable, LPN   07/07/6642   Nurse Notes: She wants referral to Rheumatologist - she is unhappy with Orthopedic provider and feels she needs to be treated differently.

## 2021-11-08 NOTE — Patient Instructions (Signed)
Angela Dawson , Thank you for taking time to come for your Medicare Wellness Visit. I appreciate your ongoing commitment to your health goals. Please review the following plan we discussed and let me know if I can assist you in the future.   Screening recommendations/referrals: Colonoscopy: Done 11/11/2011 - Repeat in 10 years *due - discuss with PCP Mammogram: Done 04/15/2019 - Repeat annually* due - I made your appointment for 11/23/2021 @ 1:10 pm Bone Density: Done 04/02/2017 - Repeat in 5 years Recommended yearly ophthalmology/optometry visit for glaucoma screening and checkup Recommended yearly dental visit for hygiene and checkup  Vaccinations: Influenza vaccine: Done 12/27/2020 - Repeat annually Pneumococcal vaccine: Done 10/11/2015 & 03/20/2017 Tdap vaccine: Due - recommended every 10 years Shingles vaccine: Due - Shingrix is 2 doses 2-6 months apart and over 90% effective     Covid-19:Done  04/15/2019, 05/13/2019, 01/21/2020 - consider booster at pharmacy in October   Advanced directives: in chart  Conditions/risks identified: Each day, aim for 6 glasses of water, plenty of protein in your diet and try to get up and walk/ stretch every hour for 5-10 minutes at a time.    Next appointment: Follow up in one year for your annual wellness visit    Preventive Care 65 Years and Older, Female Preventive care refers to lifestyle choices and visits with your health care provider that can promote health and wellness. What does preventive care include? A yearly physical exam. This is also called an annual well check. Dental exams once or twice a year. Routine eye exams. Ask your health care provider how often you should have your eyes checked. Personal lifestyle choices, including: Daily care of your teeth and gums. Regular physical activity. Eating a healthy diet. Avoiding tobacco and drug use. Limiting alcohol use. Practicing safe sex. Taking low-dose aspirin every day. Taking vitamin and  mineral supplements as recommended by your health care provider. What happens during an annual well check? The services and screenings done by your health care provider during your annual well check will depend on your age, overall health, lifestyle risk factors, and family history of disease. Counseling  Your health care provider may ask you questions about your: Alcohol use. Tobacco use. Drug use. Emotional well-being. Home and relationship well-being. Sexual activity. Eating habits. History of falls. Memory and ability to understand (cognition). Work and work Astronomer. Reproductive health. Screening  You may have the following tests or measurements: Height, weight, and BMI. Blood pressure. Lipid and cholesterol levels. These may be checked every 5 years, or more frequently if you are over 90 years old. Skin check. Lung cancer screening. You may have this screening every year starting at age 9 if you have a 30-pack-year history of smoking and currently smoke or have quit within the past 15 years. Fecal occult blood test (FOBT) of the stool. You may have this test every year starting at age 81. Flexible sigmoidoscopy or colonoscopy. You may have a sigmoidoscopy every 5 years or a colonoscopy every 10 years starting at age 58. Hepatitis C blood test. Hepatitis B blood test. Sexually transmitted disease (STD) testing. Diabetes screening. This is done by checking your blood sugar (glucose) after you have not eaten for a while (fasting). You may have this done every 1-3 years. Bone density scan. This is done to screen for osteoporosis. You may have this done starting at age 73. Mammogram. This may be done every 1-2 years. Talk to your health care provider about how often you should have  regular mammograms. Talk with your health care provider about your test results, treatment options, and if necessary, the need for more tests. Vaccines  Your health care provider may recommend  certain vaccines, such as: Influenza vaccine. This is recommended every year. Tetanus, diphtheria, and acellular pertussis (Tdap, Td) vaccine. You may need a Td booster every 10 years. Zoster vaccine. You may need this after age 52. Pneumococcal 13-valent conjugate (PCV13) vaccine. One dose is recommended after age 3. Pneumococcal polysaccharide (PPSV23) vaccine. One dose is recommended after age 67. Talk to your health care provider about which screenings and vaccines you need and how often you need them. This information is not intended to replace advice given to you by your health care provider. Make sure you discuss any questions you have with your health care provider. Document Released: 03/19/2015 Document Revised: 11/10/2015 Document Reviewed: 12/22/2014 Elsevier Interactive Patient Education  2017 [employer].  Fall Prevention in the Home Falls can cause injuries. They can happen to people of all ages. There are many things you can do to make your home safe and to help prevent falls. What can I do on the outside of my home? Regularly fix the edges of walkways and driveways and fix any cracks. Remove anything that might make you trip as you walk through a door, such as a raised step or threshold. Trim any bushes or trees on the path to your home. Use bright outdoor lighting. Clear any walking paths of anything that might make someone trip, such as rocks or tools. Regularly check to see if handrails are loose or broken. Make sure that both sides of any steps have handrails. Any raised decks and porches should have guardrails on the edges. Have any leaves, snow, or ice cleared regularly. Use sand or salt on walking paths during winter. Clean up any spills in your garage right away. This includes oil or grease spills. What can I do in the bathroom? Use night lights. Install grab bars by the toilet and in the tub and shower. Do not use towel bars as grab bars. Use non-skid mats or  decals in the tub or shower. If you need to sit down in the shower, use a plastic, non-slip stool. Keep the floor dry. Clean up any water that spills on the floor as soon as it happens. Remove soap buildup in the tub or shower regularly. Attach bath mats securely with double-sided non-slip rug tape. Do not have throw rugs and other things on the floor that can make you trip. What can I do in the bedroom? Use night lights. Make sure that you have a light by your bed that is easy to reach. Do not use any sheets or blankets that are too big for your bed. They should not hang down onto the floor. Have a firm chair that has side arms. You can use this for support while you get dressed. Do not have throw rugs and other things on the floor that can make you trip. What can I do in the kitchen? Clean up any spills right away. Avoid walking on wet floors. Keep items that you use a lot in easy-to-reach places. If you need to reach something above you, use a strong step stool that has a grab bar. Keep electrical cords out of the way. Do not use floor polish or wax that makes floors slippery. If you must use wax, use non-skid floor wax. Do not have throw rugs and other things on the floor that  can make you trip. What can I do with my stairs? Do not leave any items on the stairs. Make sure that there are handrails on both sides of the stairs and use them. Fix handrails that are broken or loose. Make sure that handrails are as long as the stairways. Check any carpeting to make sure that it is firmly attached to the stairs. Fix any carpet that is loose or worn. Avoid having throw rugs at the top or bottom of the stairs. If you do have throw rugs, attach them to the floor with carpet tape. Make sure that you have a light switch at the top of the stairs and the bottom of the stairs. If you do not have them, ask someone to add them for you. What else can I do to help prevent falls? Wear shoes that: Do not  have high heels. Have rubber bottoms. Are comfortable and fit you well. Are closed at the toe. Do not wear sandals. If you use a stepladder: Make sure that it is fully opened. Do not climb a closed stepladder. Make sure that both sides of the stepladder are locked into place. Ask someone to hold it for you, if possible. Clearly mark and make sure that you can see: Any grab bars or handrails. First and last steps. Where the edge of each step is. Use tools that help you move around (mobility aids) if they are needed. These include: Canes. Walkers. Scooters. Crutches. Turn on the lights when you go into a dark area. Replace any light bulbs as soon as they burn out. Set up your furniture so you have a clear path. Avoid moving your furniture around. If any of your floors are uneven, fix them. If there are any pets around you, be aware of where they are. Review your medicines with your doctor. Some medicines can make you feel dizzy. This can increase your chance of falling. Ask your doctor what other things that you can do to help prevent falls. This information is not intended to replace advice given to you by your health care provider. Make sure you discuss any questions you have with your health care provider. Document Released: 12/17/2008 Document Revised: 07/29/2015 Document Reviewed: 03/27/2014 Elsevier Interactive Patient Education  2017 [employer].

## 2021-11-10 ENCOUNTER — Other Ambulatory Visit: Payer: Self-pay | Admitting: Nurse Practitioner

## 2021-11-23 ENCOUNTER — Ambulatory Visit
Admission: RE | Admit: 2021-11-23 | Discharge: 2021-11-23 | Disposition: A | Discharge status: Home / Self Care | Payer: Medicare Other | Source: Ambulatory Visit | Attending: Nurse Practitioner | Admitting: Nurse Practitioner

## 2021-11-23 DIAGNOSIS — Z1231 Encounter for screening mammogram for malignant neoplasm of breast: Secondary | ICD-10-CM | POA: Diagnosis not present

## 2021-12-01 DIAGNOSIS — Z20822 Contact with and (suspected) exposure to covid-19: Secondary | ICD-10-CM | POA: Diagnosis not present

## 2021-12-02 DIAGNOSIS — Z20822 Contact with and (suspected) exposure to covid-19: Secondary | ICD-10-CM | POA: Diagnosis not present

## 2021-12-27 DIAGNOSIS — R509 Fever, unspecified: Secondary | ICD-10-CM | POA: Diagnosis not present

## 2021-12-27 DIAGNOSIS — R6883 Chills (without fever): Secondary | ICD-10-CM | POA: Diagnosis not present

## 2021-12-27 DIAGNOSIS — Z20822 Contact with and (suspected) exposure to covid-19: Secondary | ICD-10-CM | POA: Diagnosis not present

## 2021-12-27 DIAGNOSIS — R059 Cough, unspecified: Secondary | ICD-10-CM | POA: Diagnosis not present

## 2021-12-28 DIAGNOSIS — Z20822 Contact with and (suspected) exposure to covid-19: Secondary | ICD-10-CM | POA: Diagnosis not present

## 2021-12-28 DIAGNOSIS — R059 Cough, unspecified: Secondary | ICD-10-CM | POA: Diagnosis not present

## 2021-12-28 DIAGNOSIS — R509 Fever, unspecified: Secondary | ICD-10-CM | POA: Diagnosis not present

## 2021-12-28 DIAGNOSIS — R6883 Chills (without fever): Secondary | ICD-10-CM | POA: Diagnosis not present

## 2021-12-30 ENCOUNTER — Encounter: Payer: Self-pay | Admitting: Nurse Practitioner

## 2021-12-30 ENCOUNTER — Ambulatory Visit (INDEPENDENT_AMBULATORY_CARE_PROVIDER_SITE_OTHER): Payer: Medicare Other | Admitting: Nurse Practitioner

## 2021-12-30 VITALS — BP 143/82 | HR 69 | Temp 96.9°F | Resp 20 | Ht 60.0 in | Wt 217.0 lb

## 2021-12-30 DIAGNOSIS — I10 Essential (primary) hypertension: Secondary | ICD-10-CM

## 2021-12-30 DIAGNOSIS — I77810 Thoracic aortic ectasia: Secondary | ICD-10-CM | POA: Diagnosis not present

## 2021-12-30 DIAGNOSIS — R509 Fever, unspecified: Secondary | ICD-10-CM | POA: Diagnosis not present

## 2021-12-30 DIAGNOSIS — Z23 Encounter for immunization: Secondary | ICD-10-CM

## 2021-12-30 DIAGNOSIS — E782 Mixed hyperlipidemia: Secondary | ICD-10-CM

## 2021-12-30 DIAGNOSIS — Z20822 Contact with and (suspected) exposure to covid-19: Secondary | ICD-10-CM | POA: Diagnosis not present

## 2021-12-30 DIAGNOSIS — Z952 Presence of prosthetic heart valve: Secondary | ICD-10-CM

## 2021-12-30 DIAGNOSIS — R6883 Chills (without fever): Secondary | ICD-10-CM | POA: Diagnosis not present

## 2021-12-30 DIAGNOSIS — R059 Cough, unspecified: Secondary | ICD-10-CM | POA: Diagnosis not present

## 2021-12-30 MED ORDER — LISINOPRIL 20 MG PO TABS: 20.0000 mg | ORAL_TABLET | Freq: Every day | ORAL | 1 refills | Status: DC

## 2021-12-30 MED ORDER — METOPROLOL TARTRATE 25 MG PO TABS: 25.0000 mg | ORAL_TABLET | Freq: Two times a day (BID) | ORAL | 1 refills | Status: DC

## 2021-12-30 NOTE — Progress Notes (Signed)
Subjective:    Patient ID: Angela Dawson, female    DOB: 04-01-1945, 76 y.o.   MRN: [id number]    Chief Complaint: Medical Management of Chronic Issues    HPI:  Angela Dawson is a 76 y.o. who identifies as a female who was assigned female at birth.   Social history: Lives with: by herself- family and friends check on her daily Work history: retired from [employer] and [employer]   Comes in today for follow up of the following chronic medical issues:  1. Primary hypertension No c/o chest pain, sob or headache. Does check blood pressure at home. Runs 342-876'O systolic. BP Readings from Last 3 Encounters:  12/30/21 (!) 143/82  10/31/21 (!) 140/90  06/30/21 (!) 142/80     2. Mixed hyperlipidemia Does not really watch diet and does no dedicated exercise Lab Results  Component Value Date   CHOL 197 06/30/2021   HDL 49 06/30/2021   LDLCALC 130 (H) 06/30/2021   TRIG 97 06/30/2021   CHOLHDL 4.0 06/30/2021     3. Ascending aorta dilatation (HCC) Had sleeve surgery in 2018. Has not had follow up scan since 2018.  4. Severe obesity (BMI >= 40) (HCC) Weight is down 14 lbs Wt Readings from Last 3 Encounters:  12/30/21 217 lb (98.4 kg)  11/08/21 231 lb (104.8 kg)  10/31/21 231 lb (104.8 kg)   BMI Readings from Last 3 Encounters:  12/30/21 42.38 kg/m  11/08/21 45.11 kg/m  10/31/21 45.11 kg/m      New complaints: Urinary incontinence- usually in mornings when she gets up-she has cut out soft drinks and that has helped.  Allergies  Allergen Reactions   Crestor [Rosuvastatin]     Dizziness   Lipitor [Atorvastatin]     Unable to stand or move   Meloxicam     Made patient dizzy and caused her heart to race but can take a half tablet   Statins     Leg cramps    Penicillins Rash    Has patient had a PCN reaction causing immediate rash, facial/tongue/throat swelling, SOB or lightheadedness with hypotension: Yes Has patient had a PCN reaction causing severe rash  involving mucus membranes or skin necrosis: No Has patient had a PCN reaction that required hospitalization No Has patient had a PCN reaction occurring within the last 10 years: No If all of the above answers are "NO", then may proceed with Cephalosporin use.    Outpatient Encounter Medications as of 12/30/2021  Medication Sig   acetaminophen (TYLENOL) 325 MG tablet Take 2 tablets (650 mg total) by mouth every 6 (six) hours as needed for mild pain.   aspirin EC 81 MG tablet Take 1 tablet (81 mg total) by mouth daily.   Calcium Carb-Cholecalciferol 600-800 MG-UNIT TABS Take 1 tablet by mouth daily.    cetirizine (ZYRTEC) 10 MG tablet TAKE 1 TABLET EVERY DAY   Cholecalciferol (VITAMIN D) 2000 units CAPS Take 2,000 Units by mouth daily.   lisinopril (ZESTRIL) 20 MG tablet Take 1 tablet (20 mg total) by mouth daily.   meloxicam (MOBIC) 15 MG tablet TAKE 1/2 TABLET EVERY DAY   metoprolol tartrate (LOPRESSOR) 25 MG tablet Take 1 tablet (25 mg total) by mouth 2 (two) times daily.   Red Yeast Rice 600 MG TABS Take 1 tablet by mouth daily.    No facility-administered encounter medications on file as of 12/30/2021.    Past Surgical History:  Procedure Laterality Date   AORTIC VALVE REPLACEMENT  N/A 01/17/2016   Procedure: AORTIC VALVE REPLACEMENT (AVR) USING 21MM EDWARDS MAGNA EASE PERICARDIAL BIOPROSTHESIS VALVE;  Surgeon: Grace Isaac, MD;  Location: [facility];  Service: Open Heart Surgery;  Laterality: N/A;   CARDIAC CATHETERIZATION N/A 09/24/2015   Procedure: Right/Left Heart Cath and Coronary Angiography;  Surgeon: Jettie Booze, MD;  Location: [facility];  Service: Cardiovascular;  Laterality: N/A;   COLONOSCOPY     REPLACEMENT ASCENDING AORTA N/A 01/17/2016   Procedure: REPLACEMENT ASCENDING AORTA USING HEMASHIELD PLATINUM 28MM WOVEN DOUBLE VELOUR VASCULAR GRAFT;  Surgeon: Grace Isaac, MD;  Location: [facility];  Service: Open Heart Surgery;  Laterality: N/A;   TEE WITHOUT  CARDIOVERSION N/A 01/17/2016   Procedure: TRANSESOPHAGEAL ECHOCARDIOGRAM (TEE);  Surgeon: Grace Isaac, MD;  Location: [facility];  Service: Open Heart Surgery;  Laterality: N/A;   TUBAL LIGATION      Family History  Problem Relation Age of Onset   Alzheimer's disease Mother    Heart disease Mother    Heart attack Father 75   Breast cancer Maternal Aunt    Post-traumatic stress disorder Brother       Controlled substance contract: n/a      Review of Systems  Constitutional:  Negative for diaphoresis.  Eyes:  Negative for pain.  Respiratory:  Negative for shortness of breath.   Cardiovascular:  Negative for chest pain, palpitations and leg swelling.  Gastrointestinal:  Negative for abdominal pain.  Endocrine: Negative for polydipsia.  Skin:  Negative for rash.  Neurological:  Negative for dizziness, weakness and headaches.  Hematological:  Does not bruise/bleed easily.  All other systems reviewed and are negative.      Objective:   Physical Exam Vitals and nursing note reviewed.  Constitutional:      General: She is not in acute distress.    Appearance: Normal appearance. She is well-developed.  HENT:     Head: Normocephalic.     Right Ear: Tympanic membrane normal.     Left Ear: Tympanic membrane normal.     Nose: Nose normal.     Mouth/Throat:     Mouth: Mucous membranes are moist.  Eyes:     Pupils: Pupils are equal, round, and reactive to light.  Neck:     Vascular: No carotid bruit or JVD.  Cardiovascular:     Rate and Rhythm: Normal rate and regular rhythm.     Heart sounds: Normal heart sounds.  Pulmonary:     Effort: Pulmonary effort is normal. No respiratory distress.     Breath sounds: Normal breath sounds. No wheezing or rales.  Chest:     Chest wall: No tenderness.  Abdominal:     General: Bowel sounds are normal. There is no distension or abdominal bruit.     Palpations: Abdomen is soft. There is no hepatomegaly, splenomegaly, mass or  pulsatile mass.     Tenderness: There is no abdominal tenderness.  Musculoskeletal:        General: Normal range of motion.     Cervical back: Normal range of motion and neck supple.  Lymphadenopathy:     Cervical: No cervical adenopathy.  Skin:    General: Skin is warm and dry.  Neurological:     Mental Status: She is alert and oriented to person, place, and time.     Deep Tendon Reflexes: Reflexes are normal and symmetric.  Psychiatric:        Behavior: Behavior normal.  Thought Content: Thought content normal.        Judgment: Judgment normal.    BP (!) 143/82   Pulse 69   Temp (!) 96.9 F (36.1 C) (Temporal)   Resp 20   Ht 5' (1.524 m)   Wt 217 lb (98.4 kg)   SpO2 92%   BMI 42.38 kg/m         Assessment & Plan:   Angela Dawson comes in today with chief complaint of Medical Management of Chronic Issues   Diagnosis and orders addressed:  1. Primary hypertension Low sodium diet - lisinopril (ZESTRIL) 20 MG tablet; Take 1 tablet (20 mg total) by mouth daily.  Dispense: 90 tablet; Refill: 1 - CBC with Differential/Platelet - CMP14+EGFR  2. Mixed hyperlipidemia Low fat diet - Lipid panel  3. Ascending aorta dilatation (HCC) Scan to be done - CT Angio Chest W/Cm &/Or Wo Cm; Future  4. Severe obesity (BMI >= 40) (HCC) Discussed diet and exercise for person with BMI >25 Will recheck weight in 3-6 months  5. S/P AVR (aortic valve replacement) - metoprolol tartrate (LOPRESSOR) 25 MG tablet; Take 1 tablet (25 mg total) by mouth 2 (two) times daily.  Dispense: 180 tablet; Refill: 1   Labs pending Health Maintenance reviewed Diet and exercise encouraged  Follow up plan: 6 months   Mary-Margaret Hassell Done, FNP

## 2021-12-31 DIAGNOSIS — R509 Fever, unspecified: Secondary | ICD-10-CM | POA: Diagnosis not present

## 2021-12-31 DIAGNOSIS — Z20822 Contact with and (suspected) exposure to covid-19: Secondary | ICD-10-CM | POA: Diagnosis not present

## 2021-12-31 DIAGNOSIS — R6883 Chills (without fever): Secondary | ICD-10-CM | POA: Diagnosis not present

## 2021-12-31 DIAGNOSIS — R059 Cough, unspecified: Secondary | ICD-10-CM | POA: Diagnosis not present

## 2021-12-31 LAB — CMP14+EGFR
ALT: 13 IU/L (ref 0–32)
AST: 20 IU/L (ref 0–40)
Albumin/Globulin Ratio: 1.8 (ref 1.2–2.2)
Albumin: 4.2 g/dL (ref 3.8–4.8)
Alkaline Phosphatase: 80 IU/L (ref 44–121)
BUN/Creatinine Ratio: 14 (ref 12–28)
BUN: 13 mg/dL (ref 8–27)
Bilirubin Total: 0.4 mg/dL (ref 0.0–1.2)
CO2: 25 mmol/L (ref 20–29)
Calcium: 9.5 mg/dL (ref 8.7–10.3)
Chloride: 101 mmol/L (ref 96–106)
Creatinine, Ser: 0.93 mg/dL (ref 0.57–1.00)
Globulin, Total: 2.4 g/dL (ref 1.5–4.5)
Glucose: 99 mg/dL (ref 70–99)
Potassium: 4.5 mmol/L (ref 3.5–5.2)
Sodium: 142 mmol/L (ref 134–144)
Total Protein: 6.6 g/dL (ref 6.0–8.5)
eGFR: 64 mL/min/{1.73_m2} (ref >59)

## 2021-12-31 LAB — LIPID PANEL
Chol/HDL Ratio: 4.9 ratio — ABNORMAL HIGH (ref 0.0–4.4)
Cholesterol, Total: 225 mg/dL — ABNORMAL HIGH (ref 100–199)
HDL: 46 mg/dL (ref >39)
LDL Chol Calc (NIH): 154 mg/dL — ABNORMAL HIGH (ref 0–99)
Triglycerides: 137 mg/dL (ref 0–149)
VLDL Cholesterol Cal: 25 mg/dL (ref 5–40)

## 2021-12-31 LAB — CBC WITH DIFFERENTIAL/PLATELET
Basophils Absolute: 0.1 10*3/uL (ref 0.0–0.2)
Basos: 1 %
EOS (ABSOLUTE): 0.3 10*3/uL (ref 0.0–0.4)
Eos: 3 %
Hematocrit: 44.4 % (ref 34.0–46.6)
Hemoglobin: 14.8 g/dL (ref 11.1–15.9)
Immature Grans (Abs): 0 10*3/uL (ref 0.0–0.1)
Immature Granulocytes: 0 %
Lymphocytes Absolute: 4 10*3/uL — ABNORMAL HIGH (ref 0.7–3.1)
Lymphs: 45 %
MCH: 32.5 pg (ref 26.6–33.0)
MCHC: 33.3 g/dL (ref 31.5–35.7)
MCV: 98 fL — ABNORMAL HIGH (ref 79–97)
Monocytes Absolute: 0.6 10*3/uL (ref 0.1–0.9)
Monocytes: 7 %
Neutrophils Absolute: 3.9 10*3/uL (ref 1.4–7.0)
Neutrophils: 44 %
Platelets: 250 10*3/uL (ref 150–450)
RBC: 4.55 x10E6/uL (ref 3.77–5.28)
RDW: 12.4 % (ref 11.7–15.4)
WBC: 8.8 10*3/uL (ref 3.4–10.8)

## 2022-01-03 DIAGNOSIS — R059 Cough, unspecified: Secondary | ICD-10-CM | POA: Diagnosis not present

## 2022-01-03 DIAGNOSIS — R509 Fever, unspecified: Secondary | ICD-10-CM | POA: Diagnosis not present

## 2022-01-03 DIAGNOSIS — R6883 Chills (without fever): Secondary | ICD-10-CM | POA: Diagnosis not present

## 2022-01-03 DIAGNOSIS — Z20822 Contact with and (suspected) exposure to covid-19: Secondary | ICD-10-CM | POA: Diagnosis not present

## 2022-01-04 DIAGNOSIS — R6883 Chills (without fever): Secondary | ICD-10-CM | POA: Diagnosis not present

## 2022-01-04 DIAGNOSIS — Z20822 Contact with and (suspected) exposure to covid-19: Secondary | ICD-10-CM | POA: Diagnosis not present

## 2022-01-04 DIAGNOSIS — R509 Fever, unspecified: Secondary | ICD-10-CM | POA: Diagnosis not present

## 2022-01-04 DIAGNOSIS — R059 Cough, unspecified: Secondary | ICD-10-CM | POA: Diagnosis not present

## 2022-01-05 ENCOUNTER — Ambulatory Visit (HOSPITAL_BASED_OUTPATIENT_CLINIC_OR_DEPARTMENT_OTHER): Payer: Medicare Other

## 2022-01-07 DIAGNOSIS — R6883 Chills (without fever): Secondary | ICD-10-CM | POA: Diagnosis not present

## 2022-01-07 DIAGNOSIS — R059 Cough, unspecified: Secondary | ICD-10-CM | POA: Diagnosis not present

## 2022-01-07 DIAGNOSIS — Z20822 Contact with and (suspected) exposure to covid-19: Secondary | ICD-10-CM | POA: Diagnosis not present

## 2022-01-07 DIAGNOSIS — R509 Fever, unspecified: Secondary | ICD-10-CM | POA: Diagnosis not present

## 2022-01-08 DIAGNOSIS — R509 Fever, unspecified: Secondary | ICD-10-CM | POA: Diagnosis not present

## 2022-01-08 DIAGNOSIS — R6883 Chills (without fever): Secondary | ICD-10-CM | POA: Diagnosis not present

## 2022-01-08 DIAGNOSIS — Z20822 Contact with and (suspected) exposure to covid-19: Secondary | ICD-10-CM | POA: Diagnosis not present

## 2022-01-08 DIAGNOSIS — R059 Cough, unspecified: Secondary | ICD-10-CM | POA: Diagnosis not present

## 2022-01-14 ENCOUNTER — Ambulatory Visit (HOSPITAL_BASED_OUTPATIENT_CLINIC_OR_DEPARTMENT_OTHER)
Admission: RE | Admit: 2022-01-14 | Discharge: 2022-01-14 | Disposition: A | Discharge status: Home / Self Care | Payer: Medicare Other | Source: Ambulatory Visit | Attending: Nurse Practitioner | Admitting: Nurse Practitioner

## 2022-01-14 ENCOUNTER — Other Ambulatory Visit: Payer: Self-pay | Admitting: Nurse Practitioner

## 2022-01-14 DIAGNOSIS — Z23 Encounter for immunization: Secondary | ICD-10-CM

## 2022-01-14 DIAGNOSIS — I10 Essential (primary) hypertension: Secondary | ICD-10-CM

## 2022-01-14 DIAGNOSIS — I77810 Thoracic aortic ectasia: Secondary | ICD-10-CM | POA: Diagnosis not present

## 2022-01-14 DIAGNOSIS — I700 Atherosclerosis of aorta: Secondary | ICD-10-CM | POA: Diagnosis not present

## 2022-01-14 DIAGNOSIS — E782 Mixed hyperlipidemia: Secondary | ICD-10-CM

## 2022-01-14 DIAGNOSIS — I712 Thoracic aortic aneurysm, without rupture, unspecified: Secondary | ICD-10-CM | POA: Diagnosis not present

## 2022-01-14 DIAGNOSIS — Z952 Presence of prosthetic heart valve: Secondary | ICD-10-CM

## 2022-01-14 MED ORDER — IOHEXOL 350 MG/ML SOLN
100.0000 mL | Freq: Once | INTRAVENOUS | Status: AC | PRN
  Administered 2022-01-14: 100 mL via INTRAVENOUS

## 2022-01-18 ENCOUNTER — Encounter: Payer: Self-pay | Admitting: *Deleted

## 2022-06-30 ENCOUNTER — Ambulatory Visit: Payer: Medicare Other | Admitting: Nurse Practitioner

## 2022-07-06 ENCOUNTER — Encounter: Payer: Self-pay | Admitting: Nurse Practitioner

## 2022-07-06 ENCOUNTER — Ambulatory Visit (INDEPENDENT_AMBULATORY_CARE_PROVIDER_SITE_OTHER): Payer: Medicare HMO | Admitting: Nurse Practitioner

## 2022-07-06 VITALS — BP 139/61 | HR 75 | Temp 98.0°F | Resp 20 | Ht 60.0 in | Wt 224.0 lb

## 2022-07-06 DIAGNOSIS — Z952 Presence of prosthetic heart valve: Secondary | ICD-10-CM

## 2022-07-06 DIAGNOSIS — M2550 Pain in unspecified joint: Secondary | ICD-10-CM

## 2022-07-06 DIAGNOSIS — Z789 Other specified health status: Secondary | ICD-10-CM | POA: Diagnosis not present

## 2022-07-06 DIAGNOSIS — I10 Essential (primary) hypertension: Secondary | ICD-10-CM | POA: Diagnosis not present

## 2022-07-06 DIAGNOSIS — E782 Mixed hyperlipidemia: Secondary | ICD-10-CM

## 2022-07-06 MED ORDER — METOPROLOL TARTRATE 25 MG PO TABS: 25.0000 mg | ORAL_TABLET | Freq: Two times a day (BID) | ORAL | 1 refills | Status: DC

## 2022-07-06 MED ORDER — LISINOPRIL 20 MG PO TABS: 20.0000 mg | ORAL_TABLET | Freq: Every day | ORAL | 1 refills | Status: DC

## 2022-07-06 NOTE — Progress Notes (Signed)
Subjective:    Patient ID: Angela Dawson, female    DOB: 12-25-1945, 77 y.o.   MRN: [id number]   Chief Complaint: medical management of chronic issues     HPI:  Angela Dawson is a 77 y.o. who identifies as a female who was assigned female at birth.   Social history: Lives with: lives by herself Work history: retired   Water engineer in today for follow up of the following chronic medical issues:  1. Primary hypertension No c/o chest pain, sob or headache. Does not check blood pressure at home. BP Readings from Last 3 Encounters:  12/30/21 (!) 143/82  10/31/21 (!) 140/90  06/30/21 (!) 142/80     2. Mixed hyperlipidemia Does not watch diet and does no dedicated exercise. Lab Results  Component Value Date   CHOL 225 (H) 12/30/2021   HDL 46 12/30/2021   LDLCALC 154 (H) 12/30/2021   TRIG 137 12/30/2021   CHOLHDL 4.9 (H) 12/30/2021   The 10-year ASCVD risk score (Arnett DK, et al., 2019) is: 24.5%   3. Statin intolerance Cannot tolerate statins. Doe snot want injectables  4. Severe obesity (BMI >= 40) (HCC) No recent weight changes Wt Readings from Last 3 Encounters:  07/06/22 224 lb (101.6 kg)  12/30/21 217 lb (98.4 kg)  11/08/21 231 lb (104.8 kg)   BMI Readings from Last 3 Encounters:  07/06/22 43.75 kg/m  12/30/21 42.38 kg/m  11/08/21 45.11 kg/m     New complaints: Patient is having scattered pain. Takes a lot of tylenol arthritis strength. She would like to see rheumatology.  Allergies  Allergen Reactions   Crestor [Rosuvastatin]     Dizziness   Lipitor [Atorvastatin]     Unable to stand or move   Meloxicam     Made patient dizzy and caused her heart to race but can take a half tablet   Statins     Leg cramps    Penicillins Rash    Has patient had a PCN reaction causing immediate rash, facial/tongue/throat swelling, SOB or lightheadedness with hypotension: Yes Has patient had a PCN reaction causing severe rash involving mucus membranes or skin  necrosis: No Has patient had a PCN reaction that required hospitalization No Has patient had a PCN reaction occurring within the last 10 years: No If all of the above answers are "NO", then may proceed with Cephalosporin use.    Outpatient Encounter Medications as of 07/06/2022  Medication Sig   acetaminophen (TYLENOL) 325 MG tablet Take 2 tablets (650 mg total) by mouth every 6 (six) hours as needed for mild pain.   aspirin EC 81 MG tablet Take 1 tablet (81 mg total) by mouth daily.   Calcium Carb-Cholecalciferol 600-800 MG-UNIT TABS Take 1 tablet by mouth daily.    cetirizine (ZYRTEC) 10 MG tablet TAKE 1 TABLET EVERY DAY   Cholecalciferol (VITAMIN D) 2000 units CAPS Take 2,000 Units by mouth daily.   lisinopril (ZESTRIL) 20 MG tablet Take 1 tablet (20 mg total) by mouth daily.   meloxicam (MOBIC) 15 MG tablet TAKE 1/2 TABLET EVERY DAY   metoprolol tartrate (LOPRESSOR) 25 MG tablet Take 1 tablet (25 mg total) by mouth 2 (two) times daily.   Red Yeast Rice 600 MG TABS Take 1 tablet by mouth daily.    No facility-administered encounter medications on file as of 07/06/2022.    Past Surgical History:  Procedure Laterality Date   AORTIC VALVE REPLACEMENT N/A 01/17/2016   Procedure: AORTIC VALVE REPLACEMENT (AVR)  USING EDWARDS MAGNA EASE PERICARDIAL BIOPROSTHESIS VALVE;  Surgeon: Delight Ovens, MD;  Location: [facility];  Service: Open Heart Surgery;  Laterality: N/A;   CARDIAC CATHETERIZATION N/A 09/24/2015   Procedure: Right/Left Heart Cath and Coronary Angiography;  Surgeon: Corky Crafts, MD;  Location: [facility] INVASIVE [facility];  Service: Cardiovascular;  Laterality: N/A;   COLONOSCOPY     REPLACEMENT ASCENDING AORTA N/A 01/17/2016   Procedure: REPLACEMENT ASCENDING AORTA USING HEMASHIELD PLATINUM WOVEN DOUBLE VELOUR VASCULAR GRAFT;  Surgeon: Delight Ovens, MD;  Location: [facility] OR;  Service: Open Heart Surgery;  Laterality: N/A;   TEE WITHOUT CARDIOVERSION N/A 01/17/2016    Procedure: TRANSESOPHAGEAL ECHOCARDIOGRAM (TEE);  Surgeon: Delight Ovens, MD;  Location: [facility] OR;  Service: Open Heart Surgery;  Laterality: N/A;   TUBAL LIGATION      Family History  Problem Relation Age of Onset   Alzheimer's disease Mother    Heart disease Mother    Heart attack Father 68   Breast cancer Maternal Aunt    Post-traumatic stress disorder Brother       Controlled substance contract: n/a     Review of Systems  Constitutional:  Negative for diaphoresis.  Eyes:  Negative for pain.  Respiratory:  Negative for shortness of breath.   Cardiovascular:  Negative for chest pain, palpitations and leg swelling.  Gastrointestinal:  Negative for abdominal pain.  Endocrine: Negative for polydipsia.  Skin:  Negative for rash.  Neurological:  Negative for dizziness, weakness and headaches.  Hematological:  Does not bruise/bleed easily.  All other systems reviewed and are negative.      Objective:   Physical Exam Vitals and nursing note reviewed.  Constitutional:      General: She is not in acute distress.    Appearance: Normal appearance. She is well-developed.  HENT:     Head: Normocephalic.     Right Ear: Tympanic membrane normal.     Left Ear: Tympanic membrane normal.     Nose: Nose normal.     Mouth/Throat:     Mouth: Mucous membranes are moist.  Eyes:     Pupils: Pupils are equal, round, and reactive to light.  Neck:     Vascular: No carotid bruit or JVD.  Cardiovascular:     Rate and Rhythm: Normal rate and regular rhythm.     Heart sounds: Normal heart sounds.  Pulmonary:     Effort: Pulmonary effort is normal. No respiratory distress.     Breath sounds: Normal breath sounds. No wheezing or rales.  Chest:     Chest wall: No tenderness.  Abdominal:     General: Bowel sounds are normal. There is no distension or abdominal bruit.     Palpations: Abdomen is soft. There is no hepatomegaly, splenomegaly, mass or pulsatile mass.     Tenderness: There is  no abdominal tenderness.  Musculoskeletal:        General: Normal range of motion.     Cervical back: Normal range of motion and neck supple.  Lymphadenopathy:     Cervical: No cervical adenopathy.  Skin:    General: Skin is warm and dry.  Neurological:     Mental Status: She is alert and oriented to person, place, and time.     Deep Tendon Reflexes: Reflexes are normal and symmetric.  Psychiatric:        Behavior: Behavior normal.        Thought Content: Thought content normal.  Judgment: Judgment normal.     BP 139/61   Pulse 75   Temp 98 F (36.7 C) (Oral)   Resp 20   Ht 5' (1.524 m)   Wt 224 lb (101.6 kg)   SpO2 94%   BMI 43.75 kg/m        Assessment & Plan:  VICKTORIA MUCKEY comes in today with chief complaint of Medical Management of Chronic Issues   Diagnosis and orders addressed:  1. Primary hypertension Low sodium diet - CBC with Differential/Platelet - CMP14+EGFR - lisinopril (ZESTRIL) 20 MG tablet; Take 1 tablet (20 mg total) by mouth daily.  Dispense: 90 tablet; Refill: 1  2. Mixed hyperlipidemia Still declining treatment - Lipid panel  3. Statin intolerance Still declining treatment  4. Severe obesity (BMI >= 40) (HCC) Discussed diet and exercise for person with BMI >25 Will recheck weight in 3-6 months   5. Arthralgia, unspecified joint - Arthritis Panel - Ambulatory referral to Rheumatology  6. S/P AVR (aortic valve replacement) - metoprolol tartrate (LOPRESSOR) 25 MG tablet; Take 1 tablet (25 mg total) by mouth 2 (two) times daily.  Dispense: 180 tablet; Refill: 1   Labs pending Health Maintenance reviewed Diet and exercise encouraged  Follow up plan: 6 months   Mary-Margaret Daphine Deutscher, FNP

## 2022-07-06 NOTE — Patient Instructions (Signed)
Joint Pain Joint pain may be caused by many things. Joint pain is likely to go away when you follow instructions from your health care provider for relieving pain at home. However, joint pain can also be caused by conditions that require more treatment. Common causes of joint pain include: Bruising in the area of the joint. Injury caused by repeating certain movements too many times. Age-related joint wear and tear. Buildup of uric acid crystals in the joint (gout). Inflammation of the joint. Other forms of arthritis. Infections of the joint or of the bone. Your health care provider may recommend that you take pain medicine or wear a supportive device like an elastic bandage, sling, or splint. If your joint pain continues, you may need lab or imaging tests to diagnose the cause of your joint pain. Follow these instructions at home: Managing pain, stiffness, and swelling     If directed, put ice on the painful area. To do this: If you have a removable elastic bandage, sling, or splint, take it off as told by your doctor. Put ice in a plastic bag. Place a towel between your skin and the bag. Leave the ice on for 20 minutes, 2-3 times a day. Remove the ice if your skin turns bright red. This is very important. If you cannot feel pain, heat, or cold, you have a greater risk of damage to the area. Move your fingers and toes often to reduce stiffness and swelling. Raise the injured area above the level of your heart while you are sitting or lying down. If directed, apply heat to the painful area as often as told by your health care provider. Use the heat source that your health care provider recommends, such as a moist heat pack or a heating pad. Place a towel between your skin and the heat source. Leave the heat on for 20-30 minutes. Remove the heat if your skin turns bright red. This is especially important if you are unable to feel pain, heat, or cold. You have a greater risk of getting  burned.  Activity Rest as told by your health care provider. Do not do anything that causes or worsens pain. Begin exercising or stretching the affected area as told by your health care provider. Return to your normal activities as told by your health care provider. Ask your health care provider what activities are safe for you. If you have an elastic bandage, sling, or splint: Wear the bandage, sling, or splint as told by your health care provider. Remove it only as told by your health care provider. Loosen it if your fingers or toes below the joint tingle, become numb, or turn cold and blue. Keep it clean. Ask your health care provider if you should remove it before bathing. If the bandage, sling, or splint is not waterproof: Do not let it get wet. Cover it with a watertight covering when you take a bath or shower. General instructions Treatment may include medicines for pain and inflammation that are taken by mouth or applied to the skin. Take over-the-counter and prescription medicines only as told by your health care provider. Do not use any products that contain nicotine or tobacco, such as cigarettes, e-cigarettes, and chewing tobacco. If you need help quitting, ask your health care provider. Keep all follow-up visits. This is important. Contact a health care provider if: You have pain that gets worse and does not get better with medicine. Your joint pain does not improve within 3 days. You have increased   bruising or swelling. You have a fever. You lose 10 lb (4.5 kg) or more without trying. Get help right away if: You cannot move the joint. Your fingers or toes tingle, become numb, or turn cold and blue. You have a fever along with a joint that is red, warm, and swollen. Summary Joint pain may be caused by many things. Your health care provider may recommend that you take pain medicine or wear a supportive device such as an elastic bandage, sling, or splint. If your joint pain  continues, you may need tests to diagnose the cause of your joint pain. Take over-the-counter and prescription medicines only as told by your health care provider. This information is not intended to replace advice given to you by your health care provider. Make sure you discuss any questions you have with your health care provider. Document Revised: 06/04/2019 Document Reviewed: 06/04/2019 Elsevier Patient Education  2023 Elsevier Inc.  

## 2022-07-07 LAB — ARTHRITIS PANEL
Basophils Absolute: 0.1 10*3/uL (ref 0.0–0.2)
Basos: 1 %
EOS (ABSOLUTE): 0.3 10*3/uL (ref 0.0–0.4)
Eos: 3 %
Hematocrit: 43.1 % (ref 34.0–46.6)
Hemoglobin: 14.5 g/dL (ref 11.1–15.9)
Immature Grans (Abs): 0 10*3/uL (ref 0.0–0.1)
Immature Granulocytes: 0 %
Lymphocytes Absolute: 4.2 10*3/uL — ABNORMAL HIGH (ref 0.7–3.1)
Lymphs: 46 %
MCH: 32.1 pg (ref 26.6–33.0)
MCHC: 33.6 g/dL (ref 31.5–35.7)
MCV: 95 fL (ref 79–97)
Monocytes Absolute: 0.7 10*3/uL (ref 0.1–0.9)
Monocytes: 7 %
Neutrophils Absolute: 3.9 10*3/uL (ref 1.4–7.0)
Neutrophils: 43 %
Platelets: 257 10*3/uL (ref 150–450)
RBC: 4.52 x10E6/uL (ref 3.77–5.28)
RDW: 13.1 % (ref 11.7–15.4)
Rheumatoid fact SerPl-aCnc: 14.8 IU/mL — ABNORMAL HIGH (ref <14.0)
Sed Rate: 21 mm/hr (ref 0–40)
Uric Acid: 8.3 mg/dL — ABNORMAL HIGH (ref 3.1–7.9)
WBC: 9.1 10*3/uL (ref 3.4–10.8)

## 2022-07-07 LAB — LIPID PANEL
Chol/HDL Ratio: 5.2 ratio — ABNORMAL HIGH (ref 0.0–4.4)
Cholesterol, Total: 255 mg/dL — ABNORMAL HIGH (ref 100–199)
HDL: 49 mg/dL (ref >39)
LDL Chol Calc (NIH): 171 mg/dL — ABNORMAL HIGH (ref 0–99)
Triglycerides: 189 mg/dL — ABNORMAL HIGH (ref 0–149)
VLDL Cholesterol Cal: 35 mg/dL (ref 5–40)

## 2022-07-07 LAB — CMP14+EGFR
ALT: 19 IU/L (ref 0–32)
AST: 21 IU/L (ref 0–40)
Albumin/Globulin Ratio: 1.5 (ref 1.2–2.2)
Albumin: 4.1 g/dL (ref 3.8–4.8)
Alkaline Phosphatase: 71 IU/L (ref 44–121)
BUN/Creatinine Ratio: 22 (ref 12–28)
BUN: 19 mg/dL (ref 8–27)
Bilirubin Total: 0.3 mg/dL (ref 0.0–1.2)
CO2: 21 mmol/L (ref 20–29)
Calcium: 9.7 mg/dL (ref 8.7–10.3)
Chloride: 102 mmol/L (ref 96–106)
Creatinine, Ser: 0.87 mg/dL (ref 0.57–1.00)
Globulin, Total: 2.7 g/dL (ref 1.5–4.5)
Glucose: 89 mg/dL (ref 70–99)
Potassium: 4.7 mmol/L (ref 3.5–5.2)
Sodium: 141 mmol/L (ref 134–144)
Total Protein: 6.8 g/dL (ref 6.0–8.5)
eGFR: 69 mL/min/{1.73_m2} (ref >59)

## 2022-10-09 ENCOUNTER — Encounter: Payer: Self-pay | Admitting: Family Medicine

## 2022-10-09 ENCOUNTER — Ambulatory Visit (INDEPENDENT_AMBULATORY_CARE_PROVIDER_SITE_OTHER): Payer: Medicare HMO | Admitting: Family Medicine

## 2022-10-09 VITALS — BP 169/75 | HR 78 | Temp 98.9°F | Ht 60.0 in | Wt 212.2 lb

## 2022-10-09 DIAGNOSIS — R2241 Localized swelling, mass and lump, right lower limb: Secondary | ICD-10-CM | POA: Diagnosis not present

## 2022-10-09 DIAGNOSIS — I10 Essential (primary) hypertension: Secondary | ICD-10-CM | POA: Diagnosis not present

## 2022-10-09 DIAGNOSIS — M79671 Pain in right foot: Secondary | ICD-10-CM | POA: Diagnosis not present

## 2022-10-09 MED ORDER — PREDNISONE 20 MG PO TABS: 40.0000 mg | ORAL_TABLET | Freq: Every day | ORAL | 0 refills | Status: AC

## 2022-10-09 NOTE — Progress Notes (Signed)
Acute Office Visit  Subjective:     Patient ID: Angela Dawson, female    DOB: [birth year], 77 y.o.   MRN: [id number]  Chief Complaint  Patient presents with   Foot Injury    Foot Injury  There was no injury mechanism. The quality of the pain is described as aching. The pain is moderate. The pain has been Constant since onset. Pertinent negatives include no inability to bear weight, loss of motion, loss of sensation, muscle weakness, numbness or tingling. She reports no foreign bodies present. The symptoms are aggravated by palpation. She has tried NSAIDs, elevation and ice for the symptoms. The treatment provided mild relief.   She did not take BP meds today. Denies chest pain, shortness of breath, dizziness, palpitations.   Review of Systems  Neurological:  Negative for tingling and numbness.        Objective:    BP (!) 169/75   Pulse 78   Temp 98.9 F (37.2 C) (Temporal)   Ht 5' (1.524 m)   Wt 212 lb 4 oz (96.3 kg)   SpO2 95%   BMI 41.45 kg/m  BP Readings from Last 3 Encounters:  10/09/22 (!) 169/75  07/06/22 139/61  12/30/21 (!) 143/82      Physical Exam Vitals and nursing note reviewed.  Constitutional:      General: She is not in acute distress.    Appearance: She is obese. She is not ill-appearing, toxic-appearing or diaphoretic.  Cardiovascular:     Rate and Rhythm: Normal rate and regular rhythm.     Heart sounds: Normal heart sounds. No murmur heard. Pulmonary:     Effort: Pulmonary effort is normal. No respiratory distress.     Breath sounds: Normal breath sounds. No wheezing, rhonchi or rales.  Abdominal:     General: Bowel sounds are normal. There is no distension.     Palpations: Abdomen is soft.     Tenderness: There is no abdominal tenderness. There is no guarding or rebound.  Musculoskeletal:     Cervical back: Normal range of motion. No rigidity.     Left lower leg: No edema.     Right ankle: Swelling present. No ecchymosis. Tenderness  present. Normal range of motion.     Right Achilles Tendon: No tenderness or defects. Thompson's test negative.     Right foot: Normal range of motion and normal capillary refill. Swelling and tenderness present. No deformity, foot drop or bony tenderness.  Skin:    General: Skin is warm and dry.  Neurological:     General: No focal deficit present.     Mental Status: She is alert and oriented to person, place, and time.  Psychiatric:        Mood and Affect: Mood normal.        Behavior: Behavior normal.     No results found for any visits on 10/09/22.      Assessment & Plan:   Angela Dawson was seen today for foot injury.  Diagnoses and all orders for this visit:  Localized swelling of right foot -     Uric Acid -     predniSONE (DELTASONE) 20 MG tablet; Take 2 tablets (40 mg total) by mouth daily with breakfast for 5 days.  Pain of right foot -     Uric Acid -     predniSONE (DELTASONE) 20 MG tablet; Take 2 tablets (40 mg total) by mouth daily with breakfast for 5 days.  Primary hypertension  Previously had elevated UC level. Discussed likely gout. Will recheck today. Prednisone burst as above. Ok to use ibuprofen for the next few days to reduce inflammation. RICE therapy. BP not at goal today, didn't take meds today. Monitor BP at home and notify for elevated readings. Discussed compliance with medications.   Return if symptoms worsen or fail to improve.  The patient indicates understanding of these issues and agrees with the plan.  Gabriel Earing, FNP

## 2022-10-09 NOTE — Patient Instructions (Signed)
Gout  Gout is a condition that causes painful swelling of the joints. Gout is a type of inflammation of the joints (arthritis). This condition is caused by having too much uric acid in the body. Uric acid is a chemical that forms when the body breaks down substances called purines. Purines are important for building body proteins. When the body has too much uric acid, sharp crystals can form and build up inside the joints. This causes pain and swelling. Gout attacks can happen quickly and may be very painful (acute gout). Over time, the attacks can affect more joints and become more frequent (chronic gout). Gout can also cause uric acid to build up under the skin and inside the kidneys. What are the causes? This condition is caused by too much uric acid in your blood. This can happen because: Your kidneys do not remove enough uric acid from your blood. This is the most common cause. Your body makes too much uric acid. This can happen with some cancers and cancer treatments. It can also occur if your body is breaking down too many red blood cells (hemolytic anemia). You eat too many foods that are high in purines. These foods include organ meats and some seafood. Alcohol, especially beer, is also high in purines. A gout attack may be triggered by trauma or stress. What increases the risk? The following factors may make you more likely to develop this condition: Having a family history of gout. Being female and middle-aged. Being female and having gone through menopause. Taking certain medicines, including aspirin, cyclosporine, diuretics, levodopa, and niacin. Having an organ transplant. Having certain conditions, such as: Being obese. Lead poisoning. Kidney disease. A skin condition called psoriasis. Other factors include: Losing weight too quickly. Being dehydrated. Frequently drinking alcohol, especially beer. Frequently drinking beverages that are sweetened with a type of sugar called  fructose. What are the signs or symptoms? An attack of acute gout happens quickly. It usually occurs in just one joint. The most common place is the big toe. Attacks often start at night. Other joints that may be affected include joints of the feet, ankle, knee, fingers, wrist, or elbow. Symptoms of this condition may include: Severe pain. Warmth. Swelling. Stiffness. Tenderness. The affected joint may be very painful to touch. Shiny, red, or purple skin. Chills and fever. Chronic gout may cause symptoms more frequently. More joints may be involved. You may also have white or yellow lumps (tophi) on your hands or feet or in other areas near your joints. How is this diagnosed? This condition is diagnosed based on your symptoms, your medical history, and a physical exam. You may have tests, such as: Blood tests to measure uric acid levels. Removal of joint fluid with a thin needle (aspiration) to look for uric acid crystals. X-rays to look for joint damage. How is this treated? Treatment for this condition has two phases: treating an acute attack and preventing future attacks. Acute gout treatment may include medicines to reduce pain and swelling, including: NSAIDs, such as ibuprofen. Steroids. These are strong anti-inflammatory medicines that can be taken by mouth (orally) or injected into a joint. Colchicine. This medicine relieves pain and swelling when it is taken soon after an attack. It can be given by mouth or through an IV. Preventive treatment may include: Daily use of smaller doses of NSAIDs or colchicine. Use of a medicine that reduces uric acid levels in your blood, such as allopurinol. Changes to your diet. You may need to see   a dietitian about what to eat and drink to prevent gout. Follow these instructions at home: During a gout attack  If directed, put ice on the affected area. To do this: Put ice in a plastic bag. Place a towel between your skin and the bag. Leave the  ice on for 20 minutes, 2-3 times a day. Remove the ice if your skin turns bright red. This is very important. If you cannot feel pain, heat, or cold, you have a greater risk of damage to the area. Raise (elevate) the affected joint above the level of your heart as often as possible. Rest the joint as much as possible. If the affected joint is in your leg, you may be given crutches to use. Follow instructions from your health care provider about eating or drinking restrictions. Avoiding future gout attacks Follow a low-purine diet as told by your dietitian or health care provider. Avoid foods and drinks that are high in purines, including liver, kidney, anchovies, asparagus, herring, mushrooms, mussels, and beer. Maintain a healthy weight or lose weight if you are overweight. If you want to lose weight, talk with your health care provider. Do not lose weight too quickly. Start or maintain an exercise program as told by your health care provider. Eating and drinking Avoid drinking beverages that contain fructose. Drink enough fluids to keep your urine pale yellow. If you drink alcohol: Limit how much you have to: 0-1 drink a day for women who are not pregnant. 0-2 drinks a day for men. Know how much alcohol is in a drink. In the [place] one drink equals one 12 oz bottle of beer (355 mL), one 5 oz glass of wine (148 mL), or one 1 oz glass of hard liquor (44 mL). General instructions Take over-the-counter and prescription medicines only as told by your health care provider. Ask your health care provider if the medicine prescribed to you requires you to avoid driving or using machinery. Return to your normal activities as told by your health care provider. Ask your health care provider what activities are safe for you. Keep all follow-up visits. This is important. Where to find more information National Institutes of Health: www.niams.nih.gov Contact a health care provider if you have: Another  gout attack. Continuing symptoms of a gout attack after 10 days of treatment. Side effects from your medicines. Chills or a fever. Burning pain when you urinate. Pain in your lower back or abdomen. Get help right away if you: Have severe or uncontrolled pain. Cannot urinate. Summary Gout is painful swelling of the joints caused by having too much uric acid in the body. The most common site for gout to occur is in the big toe, but it can affect other joints in the body. Medicines and dietary changes can help to prevent and treat gout attacks. This information is not intended to replace advice given to you by your health care provider. Make sure you discuss any questions you have with your health care provider. Document Revised: 11/24/2020 Document Reviewed: 11/24/2020 Elsevier Patient Education  2024 Elsevier Inc.  

## 2022-10-11 NOTE — Progress Notes (Signed)
Patient r/c  

## 2022-11-27 ENCOUNTER — Other Ambulatory Visit: Payer: Self-pay | Admitting: Rheumatology

## 2022-11-29 NOTE — Progress Notes (Deleted)
Office Visit Note  Patient: Angela Dawson             Date of Birth: 1945/09/16           MRN: [id number]             PCP: Bennie Pierini, FNP Referring: Bennie Pierini, * Visit Date: 12/12/2022 Occupation: @GUAROCC @  Subjective:  No chief complaint on file.   History of Present Illness: Angela Dawson is a 77 y.o. female ***     Activities of Daily Living:  Patient reports morning stiffness for *** {minute/hour:19697}.   Patient {ACTIONS;DENIES/REPORTS:21021675::"Denies"} nocturnal pain.  Difficulty dressing/grooming: {ACTIONS;DENIES/REPORTS:21021675::"Denies"} Difficulty climbing stairs: {ACTIONS;DENIES/REPORTS:21021675::"Denies"} Difficulty getting out of chair: {ACTIONS;DENIES/REPORTS:21021675::"Denies"} Difficulty using hands for taps, buttons, cutlery, and/or writing: {ACTIONS;DENIES/REPORTS:21021675::"Denies"}  No Rheumatology ROS completed.   PMFS History:  Patient Active Problem List   Diagnosis Date Noted   Statin intolerance 10/03/2019   S/P AVR (aortic valve replacement) 01/17/2016   Ascending aorta dilatation (HCC) 11/20/2013   Hyperlipidemia 09/30/2013   Severe obesity (BMI >= 40) (HCC) 09/30/2013   Hypertension 07/17/2012    Past Medical History:  Diagnosis Date   Allergy    seasonal   Chicken pox    Complication of anesthesia    difficult to wake up after tubal ligations   Dyspnea    with exertion   Heart murmur    History of bronchitis    History of kidney stones    "only one"   Hypertension    Measles     Family History  Problem Relation Age of Onset   Alzheimer's disease Mother    Heart disease Mother    Heart attack Father 43   Breast cancer Maternal Aunt    Post-traumatic stress disorder Brother    Past Surgical History:  Procedure Laterality Date   AORTIC VALVE REPLACEMENT N/A 01/17/2016   Procedure: AORTIC VALVE REPLACEMENT (AVR) USING EDWARDS MAGNA EASE PERICARDIAL BIOPROSTHESIS VALVE;  Surgeon: Delight Ovens, MD;  Location: [facility] OR;  Service: Open Heart Surgery;  Laterality: N/A;   CARDIAC CATHETERIZATION N/A 09/24/2015   Procedure: Right/Left Heart Cath and Coronary Angiography;  Surgeon: Corky Crafts, MD;  Location: [facility] INVASIVE CV LAB;  Service: Cardiovascular;  Laterality: N/A;   COLONOSCOPY     REPLACEMENT ASCENDING AORTA N/A 01/17/2016   Procedure: REPLACEMENT ASCENDING AORTA USING HEMASHIELD PLATINUM WOVEN DOUBLE VELOUR VASCULAR GRAFT;  Surgeon: Delight Ovens, MD;  Location: [facility] OR;  Service: Open Heart Surgery;  Laterality: N/A;   TEE WITHOUT CARDIOVERSION N/A 01/17/2016   Procedure: TRANSESOPHAGEAL ECHOCARDIOGRAM (TEE);  Surgeon: Delight Ovens, MD;  Location: [facility] OR;  Service: Open Heart Surgery;  Laterality: N/A;   TUBAL LIGATION     Social History   Social History Narrative   Lives alone. Pt states son lives in [place].    Immunization History  Administered Date(s) Administered   Fluad Quad(high Dose 65+) 12/02/2018, 02/05/2020, 12/27/2020, 12/30/2021   Influenza, High Dose Seasonal PF 12/01/2015, 03/20/2017, 01/24/2018   Moderna Sars-Covid-2 Vaccination 04/15/2019, 05/13/2019, 01/21/2020   Pneumococcal Conjugate-13 10/11/2015   Pneumococcal Polysaccharide-23 03/20/2017     Objective: Vital Signs: There were no vitals taken for this visit.   Physical Exam   Musculoskeletal Exam: ***  CDAI Exam: CDAI Score: -- Patient Global: --; Provider Global: -- Swollen: --; Tender: -- Joint Exam 12/12/2022   No joint exam has been documented for this visit   There is currently no information documented on the homunculus.  Go to the Rheumatology activity and complete the homunculus joint exam.  Investigation: No additional findings.  Imaging: No results found.  Recent Labs: Lab Results  Component Value Date   WBC 9.1 07/06/2022   HGB 14.5 07/06/2022   PLT 257 07/06/2022   NA 141 07/06/2022   K 4.7 07/06/2022   CL 102 07/06/2022   CO2 21  07/06/2022   GLUCOSE 89 07/06/2022   BUN 19 07/06/2022   CREATININE 0.87 07/06/2022   BILITOT 0.3 07/06/2022   ALKPHOS 71 07/06/2022   AST 21 07/06/2022   ALT 19 07/06/2022   PROT 6.8 07/06/2022   ALBUMIN 4.1 07/06/2022   CALCIUM 9.7 07/06/2022   GFRAA 78 04/27/2020    Speciality Comments: No specialty comments available.  Procedures:  No procedures performed Allergies: Crestor [rosuvastatin], Lipitor [atorvastatin], Meloxicam, Statins, and Penicillins   Assessment / Plan:     Visit Diagnoses: No diagnosis found.  Orders: No orders of the defined types were placed in this encounter.  No orders of the defined types were placed in this encounter.   Face-to-face time spent with patient was *** minutes. Greater than 50% of time was spent in counseling and coordination of care.  Follow-Up Instructions: No follow-ups on file.   Pollyann Savoy, MD  Note - This record has been created using Animal nutritionist.  Chart creation errors have been sought, but may not always  have been located. Such creation errors do not reflect on  the standard of medical care.

## 2022-12-04 ENCOUNTER — Ambulatory Visit (INDEPENDENT_AMBULATORY_CARE_PROVIDER_SITE_OTHER): Payer: Medicare HMO

## 2022-12-04 ENCOUNTER — Encounter: Payer: Self-pay | Admitting: Rheumatology

## 2022-12-04 ENCOUNTER — Ambulatory Visit: Payer: Medicare HMO | Attending: Rheumatology | Admitting: Rheumatology

## 2022-12-04 ENCOUNTER — Ambulatory Visit: Payer: Medicare HMO

## 2022-12-04 VITALS — BP 189/101 | HR 67 | Resp 16 | Ht <= 58 in | Wt 208.8 lb

## 2022-12-04 DIAGNOSIS — M2550 Pain in unspecified joint: Secondary | ICD-10-CM

## 2022-12-04 DIAGNOSIS — M25551 Pain in right hip: Secondary | ICD-10-CM | POA: Diagnosis not present

## 2022-12-04 DIAGNOSIS — M79671 Pain in right foot: Secondary | ICD-10-CM

## 2022-12-04 DIAGNOSIS — M79672 Pain in left foot: Secondary | ICD-10-CM

## 2022-12-04 DIAGNOSIS — G8929 Other chronic pain: Secondary | ICD-10-CM | POA: Diagnosis not present

## 2022-12-04 DIAGNOSIS — M542 Cervicalgia: Secondary | ICD-10-CM | POA: Diagnosis not present

## 2022-12-04 DIAGNOSIS — M79641 Pain in right hand: Secondary | ICD-10-CM | POA: Diagnosis not present

## 2022-12-04 DIAGNOSIS — M1A09X0 Idiopathic chronic gout, multiple sites, without tophus (tophi): Secondary | ICD-10-CM | POA: Diagnosis not present

## 2022-12-04 DIAGNOSIS — M545 Low back pain, unspecified: Secondary | ICD-10-CM | POA: Diagnosis not present

## 2022-12-04 DIAGNOSIS — E782 Mixed hyperlipidemia: Secondary | ICD-10-CM | POA: Diagnosis not present

## 2022-12-04 DIAGNOSIS — Z952 Presence of prosthetic heart valve: Secondary | ICD-10-CM

## 2022-12-04 DIAGNOSIS — M25511 Pain in right shoulder: Secondary | ICD-10-CM

## 2022-12-04 DIAGNOSIS — M79642 Pain in left hand: Secondary | ICD-10-CM

## 2022-12-04 DIAGNOSIS — R768 Other specified abnormal immunological findings in serum: Secondary | ICD-10-CM | POA: Diagnosis not present

## 2022-12-04 DIAGNOSIS — E790 Hyperuricemia without signs of inflammatory arthritis and tophaceous disease: Secondary | ICD-10-CM

## 2022-12-04 DIAGNOSIS — Z5181 Encounter for therapeutic drug level monitoring: Secondary | ICD-10-CM | POA: Diagnosis not present

## 2022-12-04 DIAGNOSIS — Z789 Other specified health status: Secondary | ICD-10-CM

## 2022-12-04 DIAGNOSIS — I77810 Thoracic aortic ectasia: Secondary | ICD-10-CM

## 2022-12-04 DIAGNOSIS — I10 Essential (primary) hypertension: Secondary | ICD-10-CM

## 2022-12-04 DIAGNOSIS — M25552 Pain in left hip: Secondary | ICD-10-CM

## 2022-12-04 DIAGNOSIS — M25512 Pain in left shoulder: Secondary | ICD-10-CM

## 2022-12-04 NOTE — Progress Notes (Signed)
Counseled patient on the purpose proper use, and adverse effects of allopurinol and colchicine.  Discussed the importance of taking allopurinol every day to lower uric acid levels.  The possibility of recurrent gout while lowering the uric acid was explained and discussed the importance of taking colchicine daily at this time.    Counseled patient on the purpose proper use, and adverse effects of colchicine.  Reviewed importance of avoiding alcohol. Discussed possible side effects of nausea, diarrhea which are dose-related. Discussed possible for muscle weakness after long period of use.  Discussed importance of low purine diet - avoiding alcohol, shellfish, red meats, etc.  Provided patient with medication education material and answered all questions.    No drug-drug interactions noted with current med list Provided patient with medication education material and answered all questions.     Patient's doses will be: colchiceine 0.6 mg once daily in combination with allopurinol 100mg  daily x 1 weeks, 200mg  daily x 1 week, then 300mg  daily thereafter  Will recheck CMP and uric acid in 1 month   Chesley Mires, PharmD, MPH, BCPS, CPP Clinical Pharmacist (Rheumatology and Pulmonology)

## 2022-12-04 NOTE — Patient Instructions (Signed)
Allopurinol Tablets What is this medication? ALLOPURINOL (al oh PURE i nole) treats gout or kidney stones. It may also be used to prevent high uric acid levels after chemotherapy. It works by decreasing uric acid levels in your body. This medicine may be used for other purposes; ask your health care provider or pharmacist if you have questions. COMMON BRAND NAME(S): Zyloprim What should I tell my care team before I take this medication? They need to know if you have any of these conditions: Kidney disease Liver disease An unusual or allergic reaction to allopurinol, other medications, foods, dyes, or preservatives Pregnant or trying to get pregnant Breast-feeding How should I use this medication? Take this medication by mouth with a full glass of water. Take it as directed on the prescription label at the same time every day. You can take it with or without food. If it upsets your stomach, take it with food. Keep taking it unless your care team tells you to stop. Talk to your care team about the use of this medication in children. While this medication may be prescribed for children for selected conditions, precautions do apply. Overdosage: If you think you have taken too much of this medicine contact a poison control center or emergency room at once. NOTE: This medicine is only for you. Do not share this medicine with others. What if I miss a dose? If you miss a dose, take it as soon as you can. If it is almost time for your next dose, take only that dose. Do not take double or extra doses. What may interact with this medication? Do not take this medication with the following: Didanosine, ddI This medication may also interact with the following: Certain antibiotics like amoxicillin, ampicillin Certain medications for cancer Certain medications for immunosuppression like azathioprine, cyclosporine, mercaptopurine Chlorpropamide Probenecid Sulfinpyrazone Thiazide diuretics, like  hydrochlorothiazide Warfarin This list may not describe all possible interactions. Give your health care provider a list of all the medicines, herbs, non-prescription drugs, or dietary supplements you use. Also tell them if you smoke, drink alcohol, or use illegal drugs. Some items may interact with your medicine. What should I watch for while using this medication? Visit your care team for regular checks on your progress. If you are taking this medication to treat gout, you may not have less frequent attacks at first. Keep taking your medication regularly and the attacks should get better within 2 to 6 weeks. Drink plenty of water (10 to 12 full glasses a day) while you are taking this medication. This will help to reduce stomach upset and reduce the risk of getting gout or kidney stones. This medication may cause serious skin reactions. They can happen weeks to months after starting the medication. Contact your care team right away if you notice fevers or flu-like symptoms with a rash. The rash may be red or purple and then turn into blisters or peeling of the skin. You may also notice a red rash with swelling of the face, lips, or lymph nodes in your neck or under your arms. Do not take vitamin C without asking your care team. Too much vitamin C can increase the chance of getting kidney stones. This medication may affect your coordination, reaction time, or judgment. Do not drive or operate machinery until you know how this medication affects you. Sit up or stand slowly to reduce the risk of dizzy or fainting spells. Drinking alcohol with this medication can increase the risk of these side effects. What  side effects may I notice from receiving this medication? Side effects that you should report to your care team as soon as possible: Allergic reactions--skin rash, itching, hives, swelling of the face, lips, tongue, or throat Kidney injury--decrease in the amount of urine, swelling of the ankles, hands,  or feet Liver injury--right upper belly pain, loss of appetite, nausea, light-colored stool, dark yellow or brown urine, yellowing skin or eyes, unusual weakness or fatigue Rash, fever, and swollen lymph nodes Redness, blistering, peeling, or loosening of the skin, including inside the mouth Side effects that usually do not require medical attention (report to your care team if they continue or are bothersome): Diarrhea Drowsiness Nausea Vomiting This list may not describe all possible side effects. Call your doctor for medical advice about side effects. You may report side effects to FDA at 1-800-FDA-1088. Where should I keep my medication? Keep out of the reach of children and pets. Store at room temperature between 15 and 25 degrees C (59 and 77 degrees F). Protect from light and moisture. Throw away any unused medication after the expiration date. NOTE: This sheet is a summary. It may not cover all possible information. If you have questions about this medicine, talk to your doctor, pharmacist, or health care provider.  2024 Elsevier/Gold Standard (2021-09-09 00:00:00)  Colchicine Tablets What is this medication? COLCHICINE (KOL chi seen) prevents and treats gout attacks. It may also be used to treat familial Mediterranean fever (FMF). It works by decreasing inflammation and reducing the buildup of uric acid in your joints. This medicine may be used for other purposes; ask your health care provider or pharmacist if you have questions. COMMON BRAND NAME(S): Colcrys, LODOCO What should I tell my care team before I take this medication? They need to know if you have any of these conditions: Kidney disease Liver disease An unusual or allergic reaction to colchicine, other medications, foods, dyes, or preservatives Pregnant or trying to get pregnant Breast-feeding How should I use this medication? Take this medication by mouth with a full glass of water. Take it as directed on the  prescription label at the same time every day. You can take it with or without food. If it upsets your stomach, take it with food. A special MedGuide will be given to you by the pharmacist with each prescription and refill. Be sure to read this information carefully each time. Talk to your care team about the use of this medication in children. While it may be prescribed for children as young as 4 years for selected conditions, precautions do apply. People 65 years and older may have a stronger reaction and need a smaller dose. Overdosage: If you think you have taken too much of this medicine contact a poison control center or emergency room at once. NOTE: This medicine is only for you. Do not share this medicine with others. What if I miss a dose? If you miss a dose, take it as soon as you can. If it is almost time for your next dose, take only that dose. Do not take double or extra doses. What may interact with this medication? Do not take this medication with any of the following: Certain antivirals for HIV or hepatitis This medication may also interact with the following: Certain antibiotics, such as erythromycin or clarithromycin Certain medications for blood pressure, heart disease, irregular heartbeat Certain medications for cholesterol, such as atorvastatin, lovastatin, or simvastatin Certain medications for fungal infections, such as ketoconazole, itraconazole, or posaconazole Cyclosporine Grapefruit or  grapefruit juice This list may not describe all possible interactions. Give your health care provider a list of all the medicines, herbs, non-prescription drugs, or dietary supplements you use. Also tell them if you smoke, drink alcohol, or use illegal drugs. Some items may interact with your medicine. What should I watch for while using this medication? Visit your care team for regular checks on your progress. Tell your care team if your symptoms do not start to get better or if they get  worse. You should make sure you get enough vitamin B12 while you are taking this medication. Discuss the foods you eat and the vitamins you take with your care team. This medication may increase your risk to bruise or bleed. Call you care team if you notice any unusual bleeding. What side effects may I notice from receiving this medication? Side effects that you should report to your care team as soon as possible: Allergic reactions--skin rash, itching, hives, swelling of the face, lips, tongue, or throat Infection--fever, chills, cough, sore throat, wounds that don't heal, pain or trouble when passing urine, general feeling of discomfort or being unwell Muscle injury--unusual weakness or fatigue, muscle pain, dark yellow or brown urine, decrease in the amount of urine Pain, tingling, or numbness in the hands or feet Unusual bleeding or bruising Side effects that usually do not require medical attention (report these to your care team if they continue or are bothersome): Diarrhea Nausea Vomiting This list may not describe all possible side effects. Call your doctor for medical advice about side effects. You may report side effects to FDA at 1-800-FDA-1088. Where should I keep my medication? Keep out of the reach of children and pets. Store at room temperature between 15 and 30 degrees C (59 and 86 degrees F). Keep container tightly closed. Protect from light. Throw away any unused medication after the expiration date. NOTE: This sheet is a summary. It may not cover all possible information. If you have questions about this medicine, talk to your doctor, pharmacist, or health care provider.  2024 Elsevier/Gold Standard (2021-09-21 00:00:00) Information for patients with Gout  Gout defined-Gout occurs when urate crystals accumulate in your joint causing the inflammation and intense pain of gout attack.  Urate crystals can form when you have high levels of uric acid in your blood.  Your body  produces uric acid when it breaks down prurines-substances that are found naturally in your body, as well as in certain foods such as organ meats, anchioves, herring, asparagus, and mushrooms.  Normally uric acid dissolves in your blood and passes through your kidneys into your urine.  But sometimes your body either produces too much uric acid or your kidneys excrete too little uric acid.  When this happens, uric acid can build up, forming sharp needle-like urate crystals in a joint or surrounding tissue that cause pain, inflammation and swelling.    Gout is characterized by sudden, severe attacks of pain, redness and tenderness in joints, often the joint at the base of the big toe.  Gout is complex form of arthritis that can affect anyone.  Men are more likely to get gout but women become increasingly more susceptible to gout after menopause.  An acute attack of gout can wake you up in the middle of the night with the sensation that your big toe is on fire.  The affected joint is hot, swollen and so tender that even the weight or the sheet on it may seem intolerable.  If you  experience symptoms of an acute gout attack it is important to your doctor as soon as the symptoms start.  Gout that goes untreated can lead to worsening pain and joint damage.  Risk Factors:  You are more likely to develop gout if you have high levels of uric acid in your body.    Factors that increase the uric acid level in your body include:  Lifestyle factors.  Excessive alcohol use-generally more than two drinks a day for men and more than one for women increase the risk of gout.  Medical conditions.  Certain conditions make it more likely that you will develop gout.  These include hypertension, and chronic conditions such as diabetes, high levels of fat and cholesterol in the blood, and narrowing of the arteries.  Certain medications.  The uses of Thiazide diuretics- commonly used to treat hypertension and low dose  aspirin can also increase uric acid levels.  Family history of gout.  If other members of your family have had gout, you are more likely to develop the disease.  Age and sex. Gout occurs more often in men than it does in women, primarily because women tend to have lower uric acid levels than men do.  Men are more likely to develop gout earlier usually between the ages of 86-50- whereas women generally develop signs and symptoms after menopause.    Tests and diagnosis:  Tests to help diagnose gout may include:  Blood test.  Your doctor may recommend a blood test to measure the uric acid level in your blood .  Blood tests can be misleading, though.  Some people have high uric acid levels but never experience gout.  And some people have signs and symptoms of gout, but don't have unusual levels of uric acid in their blood.  Joint fluid test.  Your doctor may use a needle to draw fluid from your affected joint.  When examined under the microscope, your joint fluid may reveal urate crystals.  Treatment:  Treatment for gout usually involves medications.  What medications you and your doctor choose will be based on your current health and other medications you currently take.  Gout medications can be used to treat acute gout attacks and prevent future attacks as well as reduce your risk of complications from gout such as the development of tophi from urate crystal deposits.  Alternative medicine:   Certain foods have been studied for their potential to lower uric acid levels, including:  Coffee.  Studies have found an association between coffee drinking (regular and decaf) and lower uric acid levels.  The evidence is not enough to encourage non-coffee drinkers to start, but it may give clues to new ways of treating gout in the future.  Vitamin C.  Supplements containing vitamin C may reduce the levels of uric acid in your blood.  However, vitamin as a treatment for gout. Don't assume that if a  little vitamin C is good, than lots is better.  Megadoses of vitamin C may increase your bodies uric acid levels.  Cherries.  Cherries have been associated with lower levels of uric acid in studies, but it isn't clear if they have any effect on gout signs and symptoms.  Eating more cherries and other dar-colored fruits, such as blackberries, blueberries, purple grapes and raspberries, may be a safe way to support your gout treatment.    Lifestyle/Diet Recommendations:  Drink 8 to 16 cups ( about 2 to 4 liters) of fluid each day, with at least  half being water. Avoid alcohol Eat a moderate amount of protein, preferably from healthy sources, such as low-fat or fat-free dairy, tofu, eggs, and nut butters. Limit you daily intake of meat, fish, and poultry to 4 to 6 ounces. Avoid high fat meats and desserts. Decrease you intake of shellfish, beef, lamb, pork, eggs and cheese. Choose a good source of vitamin C daily such as citrus fruits, strawberries, broccoli,  brussel sprouts, papaya, and cantaloupe.  Choose a good source of vitamin A every other day such as yellow fruits, or dark green/yellow vegetables. Avoid drastic weigh reduction or fasting.  If weigh loss is desired lose it over a period of several months. See "dietary considerations.." chart for specific food recommendations.  Dietary Considerations for people with Gout  Food with negligible purine content (0-15 mg of purine nitrogen per 100 grams food)  May use as desired except on calorie variations  Non fat milk Cocoa Cereals (except in list II) Hard candies  Buttermilk Carbonated drinks Vegetables (except in list II) Sherbet  Coffee Fruits Sugar Honey  Tea Cottage Cheese Gelatin-jell-o Salt  Fruit juice Breads Angel food Cake   Herbs/spices Jams/Jellies [employer]    Foods that do not contain excessive purine content, but must be limited due to fat content  Cream Eggs Oil and Salad Dressing  Half and Half  Peanut Butter Chocolate  Whole Milk Cakes Potato Chips  Butter Ice Cream Fried Foods  Cheese Nuts Waffles, pancakes   List II: Food with moderate purine content (50-150 mg of purine nitrogen per 100 grams of food)  Limit total amount each day to 5 oz. cooked Lean meat, other than those on list III   Poultry, other than those on list III Fish, other than those on list III   Seafood, other than those on list III  These foods may be used occasionally  Peas Lentils Bran  Spinach Oatmeal Dried Beans and Peas  Asparagus Wheat Germ Mushrooms   Additional information about meat choices  Choose fish and poultry, particularly without skin, often.  Select lean, well trimmed cuts of meat.  Avoid all fatty meats, bacon , sausage, fried meats, fried fish, or poultry, luncheon meats, cold cuts, hot dogs, meats canned or frozen in gravy, spareribs and frozen and packaged prepared meats.   List III: Foods with HIGH purine content / Foods to AVOID (150-800 mg of purine nitrogen per 100 grams of food)  Anchovies Herring Meat Broths  Liver Mackerel Meat Extracts  Kidney Scallops Meat Drippings  Sardines Wild Game Mincemeat  Sweetbreads Goose Gravy  Heart Tongue Yeast, baker's and brewers   Commercial soups made with any of the foods listed in List II or List III  In addition avoid all alcoholic beverages

## 2022-12-06 LAB — COMPLETE METABOLIC PANEL WITH GFR
AG Ratio: 1.5 (calc) (ref 1.0–2.5)
ALT: 10 U/L (ref 6–29)
AST: 17 U/L (ref 10–35)
Albumin: 4.1 g/dL (ref 3.6–5.1)
Alkaline phosphatase (APISO): 68 U/L (ref 37–153)
BUN: 12 mg/dL (ref 7–25)
CO2: 27 mmol/L (ref 20–32)
Calcium: 9.3 mg/dL (ref 8.6–10.4)
Chloride: 103 mmol/L (ref 98–110)
Creat: 0.93 mg/dL (ref 0.60–1.00)
Globulin: 2.7 g/dL (ref 1.9–3.7)
Glucose, Bld: 107 mg/dL — ABNORMAL HIGH (ref 65–99)
Potassium: 4.6 mmol/L (ref 3.5–5.3)
Sodium: 140 mmol/L (ref 135–146)
Total Bilirubin: 0.5 mg/dL (ref 0.2–1.2)
Total Protein: 6.8 g/dL (ref 6.1–8.1)
eGFR: 64 mL/min/{1.73_m2} (ref >=60)

## 2022-12-06 LAB — CYCLIC CITRUL PEPTIDE ANTIBODY, IGG: Cyclic Citrullin Peptide Ab: <16 U

## 2022-12-06 LAB — SEDIMENTATION RATE: Sed Rate: 38 mm/h — ABNORMAL HIGH (ref 0–30)

## 2022-12-12 ENCOUNTER — Ambulatory Visit: Payer: Medicare Other | Admitting: Rheumatology

## 2022-12-18 NOTE — Progress Notes (Signed)
Office Visit Note  Patient: Angela Dawson             Date of Birth: August 17, 1945           MRN: [id number]             PCP: Bennie Pierini, FNP Referring: Bennie Pierini, * Visit Date: 01/01/2023 Occupation: @GUAROCC @  Subjective:  Pain in multiple joints  History of Present Illness: Angela Dawson is a 77 y.o. female with osteoarthritis, degenerative disc disease and gout.  She returns today after her last visit on December 04, 2022.  Patient states she never got the medication at the pharmacy.  She could not start colchicine or allopurinol.  She continues to have pain and discomfort in most of her joints.  She describes discomfort in neck, lower back, shoulders, right hip and her knee joints.  She has not noticed any swelling.  Continues to have pain in her feet due to gout.    Activities of Daily Living:  Patient reports morning stiffness for all day. Patient Reports nocturnal pain.  Difficulty dressing/grooming: Denies Difficulty climbing stairs: Reports Difficulty getting out of chair: Reports Difficulty using hands for taps, buttons, cutlery, and/or writing: Reports  Review of Systems  Constitutional:  Positive for fatigue.  HENT:  Negative for mouth sores and mouth dryness.   Eyes:  Negative for dryness.  Respiratory:  Positive for shortness of breath and wheezing. Negative for cough.   Cardiovascular:  Negative for chest pain and palpitations.  Gastrointestinal:  Negative for blood in stool, constipation and diarrhea.  Endocrine: Negative for increased urination.  Genitourinary:  Positive for involuntary urination. Negative for painful urination.  Musculoskeletal:  Positive for joint pain, gait problem, joint pain, joint swelling, myalgias, muscle weakness, morning stiffness, muscle tenderness and myalgias.  Skin:  Negative for color change, rash, hair loss and sensitivity to sunlight.  Allergic/Immunologic: Negative for susceptible to infections.   Neurological:  Negative for dizziness and headaches.  Hematological:  Negative for swollen glands.  Psychiatric/Behavioral:  Negative for depressed mood and sleep disturbance. The patient is not nervous/anxious.     PMFS History:  Patient Active Problem List   Diagnosis Date Noted   Statin intolerance 10/03/2019   S/P AVR (aortic valve replacement) 01/17/2016   Ascending aorta dilatation (HCC) 11/20/2013   Hyperlipidemia 09/30/2013   Severe obesity (BMI >= 40) (HCC) 09/30/2013   Hypertension 07/17/2012    Past Medical History:  Diagnosis Date   Allergy    seasonal   Chicken pox    Complication of anesthesia    difficult to wake up after tubal ligations   Dyspnea    with exertion   Heart murmur    History of bronchitis    History of kidney stones    "only one"   Hypertension    Measles     Family History  Problem Relation Age of Onset   Alzheimer's disease Mother    Heart disease Mother    Heart attack Father 64   Post-traumatic stress disorder Brother    Breast cancer Maternal Aunt    Heart attack Son    Other Son        5 back surgeries   Past Surgical History:  Procedure Laterality Date   AORTIC VALVE REPLACEMENT N/A 01/17/2016   Procedure: AORTIC VALVE REPLACEMENT (AVR) USING EDWARDS MAGNA EASE PERICARDIAL BIOPROSTHESIS VALVE;  Surgeon: Delight Ovens, MD;  Location: [facility] OR;  Service: Open Heart Surgery;  Laterality: N/A;  CARDIAC CATHETERIZATION N/A 09/24/2015   Procedure: Right/Left Heart Cath and Coronary Angiography;  Surgeon: Corky Crafts, MD;  Location: [facility] INVASIVE CV LAB;  Service: Cardiovascular;  Laterality: N/A;   COLONOSCOPY     REPLACEMENT ASCENDING AORTA N/A 01/17/2016   Procedure: REPLACEMENT ASCENDING AORTA USING HEMASHIELD PLATINUM WOVEN DOUBLE VELOUR VASCULAR GRAFT;  Surgeon: Delight Ovens, MD;  Location: [facility] OR;  Service: Open Heart Surgery;  Laterality: N/A;   TEE WITHOUT CARDIOVERSION N/A 01/17/2016   Procedure:  TRANSESOPHAGEAL ECHOCARDIOGRAM (TEE);  Surgeon: Delight Ovens, MD;  Location: [facility] OR;  Service: Open Heart Surgery;  Laterality: N/A;   TUBAL LIGATION     Social History   Social History Narrative   Lives alone. Pt states son lives in [place].    Immunization History  Administered Date(s) Administered   Fluad Quad(high Dose 65+) 12/02/2018, 02/05/2020, 12/27/2020, 12/30/2021   Influenza, High Dose Seasonal PF 12/01/2015, 03/20/2017, 01/24/2018   Moderna Sars-Covid-2 Vaccination 04/15/2019, 05/13/2019, 01/21/2020   Pneumococcal Conjugate-13 10/11/2015   Pneumococcal Polysaccharide-23 03/20/2017     Objective: Vital Signs: BP (!) 152/76 (BP Location: Left Arm, Patient Position: Sitting, Cuff Size: Large)   Pulse 61   Ht 4\' 10"  (1.473 m)   Wt 208 lb 3.2 oz (94.4 kg)   BMI 43.51 kg/m    Physical Exam Vitals and nursing note reviewed.  Constitutional:      Appearance: She is well-developed.  HENT:     Head: Normocephalic and atraumatic.  Eyes:     Conjunctiva/sclera: Conjunctivae normal.  Cardiovascular:     Rate and Rhythm: Normal rate and regular rhythm.     Heart sounds: Normal heart sounds.  Pulmonary:     Effort: Pulmonary effort is normal.     Breath sounds: Normal breath sounds.  Abdominal:     General: Bowel sounds are normal.     Palpations: Abdomen is soft.  Musculoskeletal:     Cervical back: Normal range of motion.  Lymphadenopathy:     Cervical: No cervical adenopathy.  Skin:    General: Skin is warm and dry.     Capillary Refill: Capillary refill takes less than 2 seconds.  Neurological:     Mental Status: She is alert and oriented to person, place, and time.  Psychiatric:        Behavior: Behavior normal.      Musculoskeletal Exam: She had limited lateral rotation of the cervical spine.  She had limited range of motion of thoracic and lumbar spine with discomfort.  Shoulder joints, elbow joints, wrist joints, MCPs PIPs and DIPs were in good range  of motion.  PIP and DIP thickening was noted.  She had limited painful range of motion of her right hip joint.  Left hip joint was noted range of motion.  Knee joints were in good range of motion without warmth swelling or effusion.  She had tenderness over MTPs but no synovitis was noted.  CDAI Exam: CDAI Score: -- Patient Global: --; Provider Global: -- Swollen: --; Tender: -- Joint Exam 01/01/2023   No joint exam has been documented for this visit   There is currently no information documented on the homunculus. Go to the Rheumatology activity and complete the homunculus joint exam.  Investigation: No additional findings.  Imaging: XR Lumbar Spine 2-3 Views  Result Date: 12/04/2022 No SI joint sclerosis or narrowing was noted.  No significant disc space narrowing was noted.  Facet joint arthropathy was noted.  Aortic atherosclerosis was noted.  Impression: These findings are suggestive of facet joint arthropathy of the lumbar spine.  XR Cervical Spine 2 or 3 views  Result Date: 12/04/2022 Multilevel spondylosis with anterior osteophytes was noted.  Facet joint arthropathy was noted.  C4-C5 mild spondylolisthesis was noted. Impression: These findings are suggestive of multilevel spondylosis and facet joint arthropathy.  XR Foot 2 Views Left  Result Date: 12/04/2022 PIP and DIP narrowing was noted.  No MTP, intertarsal, tibiotalar or subtalar joint space narrowing was noted.  No erosive Ro's were noted. Impression: These findings are suggestive of early osteoarthritic changes.  XR Foot 2 Views Right  Result Date: 12/04/2022 PIP and DIP narrowing was noted.  No MTP, intertarsal, tibiotalar or subtalar joint space narrowing was noted.  No erosive Ro's were noted. Impression: These findings are suggestive of early osteoarthritic changes.  XR HIP UNILAT W OR W/O PELVIS 2-3 VIEWS RIGHT  Result Date: 12/04/2022 No SI joint sclerosis or narrowing was noted.  Moderate narrowing of the right  hip joint was noted. Impression: These findings are suggestive of degenerative changes of the hip joint.  XR Hand 2 View Left  Result Date: 12/04/2022 CMC, PIP and DIP narrowing was noted.  No MCP, intercarpal or radiocarpal joint space narrowing was noted.  No erosive changes were noted. Impression: These findings are suggestive of osteoarthritis of the hand.  XR Hand 2 View Right  Result Date: 12/04/2022 CMC, PIP and DIP narrowing was noted.  No MCP, intercarpal or radiocarpal joint space narrowing was noted.  No erosive changes were noted. Impression: These findings are suggestive of osteoarthritis of the hand.   Recent Labs: Lab Results  Component Value Date   WBC 9.1 07/06/2022   HGB 14.5 07/06/2022   PLT 257 07/06/2022   NA 140 12/04/2022   K 4.6 12/04/2022   CL 103 12/04/2022   CO2 27 12/04/2022   GLUCOSE 107 (H) 12/04/2022   BUN 12 12/04/2022   CREATININE 0.93 12/04/2022   BILITOT 0.5 12/04/2022   ALKPHOS 71 07/06/2022   AST 17 12/04/2022   ALT 10 12/04/2022   PROT 6.8 12/04/2022   ALBUMIN 4.1 07/06/2022   CALCIUM 9.3 12/04/2022   GFRAA 78 04/27/2020   December 04, 2022 ESR 38, anti-CCP negative Jul 06, 2022 uric acid 8.3 RF 14.8 sed rate 21 October 09, 2022 uric acid 8.0  Speciality Comments: No specialty comments available.  Procedures:  No procedures performed Allergies: Crestor [rosuvastatin], Lipitor [atorvastatin], Meloxicam, Statins, and Penicillins   Assessment / Plan:     Visit Diagnoses: Idiopathic chronic gout of multiple sites without tophus -patient states she could not get allopurinol and colchicine at the after the last visit.  She continues to have pain and discomfort in her feet at night.  She also has pain and discomfort in multiple joints.  Detailed counsel regarding gout was provided.  Dietary modifications were again discussed.  I will send a prescription for colchicine 0.6 mg daily and is started on allopurinol 100 mg daily.  She will increase  allopurinol by 100 mg every week until she reaches 300 mg a day.  She will stay on the same dose until the follow-up visit.  Will check labs in 2 months at the follow-up visit.  Side effects of allopurinol were again discussed.  She was advised to stop allopurinol if she develops a rash.  Polyarthralgia - History of multiple joints for the last 5 years.  She takes meloxicam on a as needed basis.  Rheumatoid factor positive -  No synovitis was noted on the examination.  Chronic pain of both shoulders - Patient had full range of motion of bilateral shoulders.  Primary osteoarthritis of both hands - No synovitis was noted on the examination.  Primary osteoarthritis of both hips - Limited painful range of motion of bilateral hip joints was noted.  X-rays of the right hip joint showed moderate osteoarthritis.  X-ray findings were reviewed with the patient.  Benefits of weight loss and pain management was discussed.  She may require hip replacement in the future.  Primary osteoarthritis of both feet -she continues to have pain and discomfort in her bilateral feet which could be possible intercritical gout.  X-rays showed early degenerative changes.  X-ray findings were reviewed with the patient.  DDD (degenerative disc disease), cervical -she had limited lateral rotation with discomfort.  X-rays showed multilevel spondylosis and facet joint arthropathy.  X-ray findings were reviewed with the patient.  Arthropathy of lumbar facet joint -she continues to have lower back pain.  X-rays showed lumbar facet joint arthropathy.  Patient gives history of chronic discomfort in her lower back.  Primary hypertension-blood pressure was elevated at 158/77.  Repeat blood pressure was 152/76.  Other medical problems are listed as follows:  Mixed hyperlipidemia  Statin intolerance - She is on red yeast rice.  Ascending aorta dilatation (HCC) - Status post replacement of ascending aorta January 17, 2016.  S/P  AVR (aortic valve replacement) - January 17, 2016  Severe obesity (BMI >= 40) (HCC)  Orders: No orders of the defined types were placed in this encounter.  Meds ordered this encounter  Medications   colchicine 0.6 MG tablet    Sig: Take 1 tablet (0.6 mg total) by mouth daily.    Dispense:  30 tablet    Refill:  2   allopurinol (ZYLOPRIM) 100 MG tablet    Sig: Take 1 tab po daily x 7 days, then take 2 tabs po daily x 7 days. Then take 3 tabs po daily.    Dispense:  63 tablet    Refill:  0    Follow-Up Instructions: Return in about 2 months (around 03/03/2023) for Gout, Osteoarthritis.   Pollyann Savoy, MD  Note - This record has been created using Animal nutritionist.  Chart creation errors have been sought, but may not always  have been located. Such creation errors do not reflect on  the standard of medical care.

## 2022-12-29 ENCOUNTER — Ambulatory Visit: Payer: Medicare HMO | Attending: Nurse Practitioner | Admitting: Nurse Practitioner

## 2022-12-29 ENCOUNTER — Encounter: Payer: Self-pay | Admitting: Nurse Practitioner

## 2022-12-29 VITALS — BP 130/80 | HR 77 | Ht <= 58 in | Wt 208.4 lb

## 2022-12-29 DIAGNOSIS — E785 Hyperlipidemia, unspecified: Secondary | ICD-10-CM | POA: Diagnosis not present

## 2022-12-29 DIAGNOSIS — I10 Essential (primary) hypertension: Secondary | ICD-10-CM | POA: Diagnosis not present

## 2022-12-29 DIAGNOSIS — Z952 Presence of prosthetic heart valve: Secondary | ICD-10-CM | POA: Diagnosis not present

## 2022-12-29 DIAGNOSIS — R0602 Shortness of breath: Secondary | ICD-10-CM

## 2022-12-29 DIAGNOSIS — R0609 Other forms of dyspnea: Secondary | ICD-10-CM | POA: Diagnosis not present

## 2022-12-29 NOTE — Patient Instructions (Addendum)
Medication Instructions:  Your physician recommends that you continue on your current medications as directed. Please refer to the Current Medication list given to you today.  Labwork: None  Testing/Procedures: Your physician has requested that you have an echocardiogram. Echocardiography is a painless test that uses sound waves to create images of your heart. It provides your doctor with information about the size and shape of your heart and how well your heart's chambers and valves are working. This procedure takes approximately one hour. There are no restrictions for this procedure. Please do NOT wear cologne, perfume, aftershave, or lotions (deodorant is allowed). Please arrive 15 minutes prior to your appointment time.  Follow-Up: Your physician recommends that you schedule a follow-up appointment in: 3 Months   Any Other Special Instructions Will Be Listed Below (If Applicable).  If you need a refill on your cardiac medications before your next appointment, please call your pharmacy.

## 2022-12-29 NOTE — Progress Notes (Unsigned)
Cardiology Office Note:  .   Date:  12/29/2022 ID:  [name], DOB [birth year], MRN [id number] PCP: Bennie Pierini, FNP  [facility] Providers Cardiologist:  Dina Rich, MD    History of Present Illness: .   Angela Dawson is a 77 y.o. female with a PMH of aortic stenosis with bicuspid AV and ascending aortic aneurysm, s/p AVR with [employer] pericardial tissue valve 21 mm and supra coronary replacement of ascending aorta in 2017, hypertension, hyperlipidemia, DOE, who presents today for 1 year follow-up.  Last seen by Dr. Dina Rich on October 31, 2021.  Her blood pressure was elevated in the office, BPs at home are at goal.  Was noted that she had a statin allergy and was documented that she had not been interested in statin alternatives.  Today she presents for 1 year follow-up.  Says she had a recent episode of gout, and ever since her flareup she has noticed more shortness of breath with exertion. Denies any chest pain, palpitations, syncope, presyncope, dizziness, orthopnea, PND, swelling or significant weight changes, acute bleeding, or claudication.  Reviewed alternatives to statins, and patient states she is open to alternatives, says she also was open to this in the past.  Tolerating current medications well.  ROS: Negative.  See HPI.  Studies Reviewed: Marland Kitchen    EKG:  EKG Interpretation Date/Time:  Friday December 29 2022 14:29:09 EDT Ventricular Rate:  83 PR Interval:  182 QRS Duration:  72 QT Interval:  378 QTC Calculation: 444 R Axis:   69  Text Interpretation: Normal sinus rhythm Nonspecific T wave abnormality When compared with ECG of 18-Jan-2016 06:54, Non-specific change in ST segment in Inferior leads Nonspecific T wave abnormality now evident in Inferior leads Nonspecific T wave abnormality, worse in Lateral leads Confirmed by Sharlene Dory [phone]) on 12/29/2022 2:47:16 PM   Echo 05/2016: Study Conclusions   - Procedure narrative:  Transthoracic echocardiography. Image    quality was adequate. The study was technically difficult, as a    result of poor acoustic windows, poor sound wave transmission,    and body habitus.  - Left ventricle: The cavity size was normal. Wall thickness was    increased in a pattern of mild LVH. Systolic function was normal.    The estimated ejection fraction was in the range of 60% to 65%.    Diastolic dysfunction, grade indeterminate. Doppler parameters    are consistent with high ventricular filling pressure.  - Aortic valve: Bioprosthetic valve [facility] Ease) by report.    Appears to be functioning normally. Peak velocity (S): 202 cm/s.    Mean gradient (S): 10 mm Hg.  - Mitral valve: Calcified annulus.  Right/left heart cath 09/2015: The left ventricular systolic function is normal. Nonobstructive CAD. Normal PA pressures. CI 2.3; PA sat 68%. Dilated ascending aorta.   Continue with plans for AVR and aneurysm repair.   Risk Assessment/Calculations:       The 10-year ASCVD risk score (Arnett DK, et al., 2019) is: 24%   Values used to calculate the score:     Age: 62 years     Sex: Female     Is Non-Hispanic African American: No     Diabetic: No     Tobacco smoker: No     Systolic Blood Pressure: 130 mmHg     Is BP treated: Yes     HDL Cholesterol: 49 mg/dL     Total Cholesterol: 255 mg/dL  Physical Exam:   VS:  BP 130/80   Pulse 77   Ht 4\' 10"  (1.473 m)   Wt 208 lb 6.4 oz (94.5 kg)   SpO2 95%   BMI 43.56 kg/m    Wt Readings from Last 3 Encounters:  12/29/22 208 lb 6.4 oz (94.5 kg)  12/04/22 208 lb 12.8 oz (94.7 kg)  10/09/22 212 lb 4 oz (96.3 kg)    GEN: Morbidly obese, 77 year old female in no acute distress NECK: No JVD; No carotid bruits CARDIAC: S1/S2, RRR, no murmurs, rubs, gallops RESPIRATORY:  Clear to auscultation without rales, wheezing or rhonchi  EXTREMITIES:  No edema; No deformity   ASSESSMENT AND PLAN: .    Aortic stenosis with  bicuspid AV and ascending aortic aneurysm, s/p AVR with [employer] science pericardial tissue valve 21 mm and supra coronary replacement of ascending aorta in 2017, DOE She is due for updated echocardiogram.  She also admits to some recent worsening dyspnea on exertion after her gout flareup.  Will update echocardiogram at this time for evaluation.  Last echocardiogram on file in 2018 revealed normal functioning bioprosthetic valve.  Continue current medication regimen.  2. HLD History of statin intolerance. She confirms to me she is interested on alternatives.  Discussed PCSK9 inhibitors with her.  She is open to starting one of these. Will route note to clinical pharm D to check with her insurance about starting Repatha/Praluent. Heart healthy diet encouraged.   3. HTN Blood pressure stable. Discussed to monitor BP at home at least 2 hours after medications and sitting for 5-10 minutes.  No medication changes at this time. Heart healthy diet encouraged.      Dispo: Follow-up with me/APP in 3 months or sooner if anything changes.   Signed, Sharlene Dory, NP

## 2023-01-01 ENCOUNTER — Ambulatory Visit: Payer: Medicare HMO | Attending: Rheumatology | Admitting: Rheumatology

## 2023-01-01 ENCOUNTER — Encounter: Payer: Self-pay | Admitting: Rheumatology

## 2023-01-01 VITALS — BP 152/76 | HR 61 | Ht <= 58 in | Wt 208.2 lb

## 2023-01-01 DIAGNOSIS — R7689 Other specified abnormal immunological findings in serum: Secondary | ICD-10-CM

## 2023-01-01 DIAGNOSIS — M19071 Primary osteoarthritis, right ankle and foot: Secondary | ICD-10-CM | POA: Diagnosis not present

## 2023-01-01 DIAGNOSIS — M19041 Primary osteoarthritis, right hand: Secondary | ICD-10-CM | POA: Diagnosis not present

## 2023-01-01 DIAGNOSIS — M19042 Primary osteoarthritis, left hand: Secondary | ICD-10-CM

## 2023-01-01 DIAGNOSIS — M2550 Pain in unspecified joint: Secondary | ICD-10-CM | POA: Diagnosis not present

## 2023-01-01 DIAGNOSIS — M47816 Spondylosis without myelopathy or radiculopathy, lumbar region: Secondary | ICD-10-CM | POA: Diagnosis not present

## 2023-01-01 DIAGNOSIS — M25512 Pain in left shoulder: Secondary | ICD-10-CM

## 2023-01-01 DIAGNOSIS — I77810 Thoracic aortic ectasia: Secondary | ICD-10-CM

## 2023-01-01 DIAGNOSIS — M25511 Pain in right shoulder: Secondary | ICD-10-CM | POA: Diagnosis not present

## 2023-01-01 DIAGNOSIS — G8929 Other chronic pain: Secondary | ICD-10-CM

## 2023-01-01 DIAGNOSIS — M160 Bilateral primary osteoarthritis of hip: Secondary | ICD-10-CM

## 2023-01-01 DIAGNOSIS — R768 Other specified abnormal immunological findings in serum: Secondary | ICD-10-CM | POA: Diagnosis not present

## 2023-01-01 DIAGNOSIS — E782 Mixed hyperlipidemia: Secondary | ICD-10-CM | POA: Diagnosis not present

## 2023-01-01 DIAGNOSIS — M5030 Other cervical disc degeneration, unspecified cervical region: Secondary | ICD-10-CM | POA: Diagnosis not present

## 2023-01-01 DIAGNOSIS — I10 Essential (primary) hypertension: Secondary | ICD-10-CM | POA: Diagnosis not present

## 2023-01-01 DIAGNOSIS — M19072 Primary osteoarthritis, left ankle and foot: Secondary | ICD-10-CM

## 2023-01-01 DIAGNOSIS — Z952 Presence of prosthetic heart valve: Secondary | ICD-10-CM

## 2023-01-01 DIAGNOSIS — M1A09X0 Idiopathic chronic gout, multiple sites, without tophus (tophi): Secondary | ICD-10-CM

## 2023-01-01 DIAGNOSIS — Z789 Other specified health status: Secondary | ICD-10-CM | POA: Diagnosis not present

## 2023-01-01 MED ORDER — COLCHICINE 0.6 MG PO TABS: 0.6000 mg | ORAL_TABLET | Freq: Every day | ORAL | 2 refills | Status: DC

## 2023-01-01 MED ORDER — ALLOPURINOL 100 MG PO TABS: ORAL_TABLET | ORAL | 0 refills | Status: DC

## 2023-01-01 NOTE — Patient Instructions (Addendum)
Take colchicine 0.6 mg daily.  Please start allopurinol 100 mg, 1 tablet daily for 1 week and then increase it to 2 tablets daily after 1 week.  And then increase to 3 tablets (300 mg) daily and stay on that dose.

## 2023-01-02 ENCOUNTER — Other Ambulatory Visit: Payer: Medicare HMO

## 2023-01-02 ENCOUNTER — Ambulatory Visit: Payer: Medicare HMO | Admitting: Nurse Practitioner

## 2023-01-02 NOTE — Progress Notes (Deleted)
Subjective:    Patient ID: Angela Dawson, female    DOB: 08-23-1945, 77 y.o.   MRN: [id number]   Chief Complaint: medical management of chronic issues     HPI:  Angela Dawson is a 77 y.o. who identifies as a female who was assigned female at birth.   Social history: Lives with: lives by herself Work history: retired   Water engineer in today for follow up of the following chronic medical issues:  1. Primary hypertension No c/o chest pain, sob or headache. Does not check blood pressure at home. BP Readings from Last 3 Encounters:  01/01/23 (!) 152/76  12/29/22 130/80  12/04/22 (!) 189/101     2. Mixed hyperlipidemia Does not watch diet and does no dedicated exercise. Cannot tolerate a statin Lab Results  Component Value Date   CHOL 255 (H) 07/06/2022   HDL 49 07/06/2022   LDLCALC 171 (H) 07/06/2022   TRIG 189 (H) 07/06/2022   CHOLHDL 5.2 (H) 07/06/2022     3. Severe obesity (BMI >= 40) (HCC) No recent weight changes ***   New complaints: ***  Allergies  Allergen Reactions   Crestor [Rosuvastatin]     Dizziness   Lipitor [Atorvastatin]     Unable to stand or move   Meloxicam     Made patient dizzy and caused her heart to race but can take a half tablet   Statins     Leg cramps    Penicillins Rash    Has patient had a PCN reaction causing immediate rash, facial/tongue/throat swelling, SOB or lightheadedness with hypotension: Yes Has patient had a PCN reaction causing severe rash involving mucus membranes or skin necrosis: No Has patient had a PCN reaction that required hospitalization No Has patient had a PCN reaction occurring within the last 10 years: No If all of the above answers are "NO", then may proceed with Cephalosporin use.    Outpatient Encounter Medications as of 01/02/2023  Medication Sig   acetaminophen (TYLENOL) 325 MG tablet Take 2 tablets (650 mg total) by mouth every 6 (six) hours as needed for mild pain.   allopurinol (ZYLOPRIM) 100 MG  tablet Take 1 tab po daily x 7 days, then take 2 tabs po daily x 7 days. Then take 3 tabs po daily.   aspirin EC 81 MG tablet Take 1 tablet (81 mg total) by mouth daily.   Calcium Carb-Cholecalciferol 600-800 MG-UNIT TABS Take 1 tablet by mouth daily.   cetirizine (ZYRTEC) 10 MG tablet TAKE 1 TABLET EVERY DAY   Cholecalciferol (VITAMIN D) 2000 units CAPS Take 2,000 Units by mouth daily.   colchicine 0.6 MG tablet Take 1 tablet (0.6 mg total) by mouth daily.   lisinopril (ZESTRIL) 20 MG tablet Take 1 tablet (20 mg total) by mouth daily.   meloxicam (MOBIC) 15 MG tablet TAKE 1/2 TABLET EVERY DAY (Patient taking differently: Take 7.5 mg by mouth daily as needed for pain.)   metoprolol tartrate (LOPRESSOR) 25 MG tablet Take 1 tablet (25 mg total) by mouth 2 (two) times daily.   Red Yeast Rice 600 MG TABS Take 1 tablet by mouth daily.   No facility-administered encounter medications on file as of 01/02/2023.    Past Surgical History:  Procedure Laterality Date   AORTIC VALVE REPLACEMENT N/A 01/17/2016   Procedure: AORTIC VALVE REPLACEMENT (AVR) USING EDWARDS MAGNA EASE PERICARDIAL BIOPROSTHESIS VALVE;  Surgeon: Delight Ovens, MD;  Location: [facility] OR;  Service: Open Heart Surgery;  Laterality:  N/A;   CARDIAC CATHETERIZATION N/A 09/24/2015   Procedure: Right/Left Heart Cath and Coronary Angiography;  Surgeon: Corky Crafts, MD;  Location: [facility] INVASIVE CV LAB;  Service: Cardiovascular;  Laterality: N/A;   COLONOSCOPY     REPLACEMENT ASCENDING AORTA N/A 01/17/2016   Procedure: REPLACEMENT ASCENDING AORTA USING HEMASHIELD PLATINUM WOVEN DOUBLE VELOUR VASCULAR GRAFT;  Surgeon: Delight Ovens, MD;  Location: [facility] OR;  Service: Open Heart Surgery;  Laterality: N/A;   TEE WITHOUT CARDIOVERSION N/A 01/17/2016   Procedure: TRANSESOPHAGEAL ECHOCARDIOGRAM (TEE);  Surgeon: Delight Ovens, MD;  Location[facility] OR;  Service: Open Heart Surgery;  Laterality: N/A;   TUBAL LIGATION      Family  History  Problem Relation Age of Onset   Alzheimer's disease Mother    Heart disease Mother    Heart attack Father 72   Post-traumatic stress disorder Brother    Breast cancer Maternal Aunt    Heart attack Son    Other Son        5 back surgeries      Controlled substance contract: ***     Review of Systems     Objective:   Physical Exam        Assessment & Plan:

## 2023-01-03 ENCOUNTER — Encounter: Payer: Self-pay | Admitting: Nurse Practitioner

## 2023-02-05 ENCOUNTER — Ambulatory Visit (INDEPENDENT_AMBULATORY_CARE_PROVIDER_SITE_OTHER): Payer: Medicare HMO | Admitting: Nurse Practitioner

## 2023-02-05 ENCOUNTER — Encounter: Payer: Self-pay | Admitting: Nurse Practitioner

## 2023-02-05 ENCOUNTER — Other Ambulatory Visit: Payer: Medicare HMO

## 2023-02-05 VITALS — BP 160/93 | HR 80 | Temp 97.5°F | Resp 20 | Ht <= 58 in | Wt 207.0 lb

## 2023-02-05 DIAGNOSIS — Z789 Other specified health status: Secondary | ICD-10-CM | POA: Diagnosis not present

## 2023-02-05 DIAGNOSIS — I10 Essential (primary) hypertension: Secondary | ICD-10-CM | POA: Diagnosis not present

## 2023-02-05 DIAGNOSIS — E782 Mixed hyperlipidemia: Secondary | ICD-10-CM

## 2023-02-05 DIAGNOSIS — Z952 Presence of prosthetic heart valve: Secondary | ICD-10-CM | POA: Diagnosis not present

## 2023-02-05 DIAGNOSIS — Z23 Encounter for immunization: Secondary | ICD-10-CM

## 2023-02-05 MED ORDER — METOPROLOL TARTRATE 25 MG PO TABS: 25.0000 mg | ORAL_TABLET | Freq: Two times a day (BID) | ORAL | 1 refills | Status: DC

## 2023-02-05 MED ORDER — LISINOPRIL 20 MG PO TABS: 20.0000 mg | ORAL_TABLET | Freq: Every day | ORAL | 1 refills | Status: DC

## 2023-02-05 NOTE — Patient Instructions (Signed)

## 2023-02-05 NOTE — Progress Notes (Signed)
Subjective:    Patient ID: Angela Dawson, female    DOB: [birth year], 77 y.o.   MRN: [id number]   Chief Complaint: medical management of chronic issues     HPI:  Angela Dawson is a 77 y.o. who identifies as a female who was assigned female at birth.   Social history: Lives with:lives by herself Work history: retired   Water engineer in today for follow up of the following chronic medical issues:  1. Primary hypertension No c/o chest pain, sob or headache. Does  check blood pressure at home. Ususally runs 130 systolic BP Readings from Last 3 Encounters:  02/05/23 (!) 160/93  01/01/23 (!) 152/76  12/29/22 130/80     2. Mixed hyperlipidemia Does not really watch diet and doe no exercise at all Lab Results  Component Value Date   CHOL 255 (H) 07/06/2022   HDL 49 07/06/2022   LDLCALC 171 (H) 07/06/2022   TRIG 189 (H) 07/06/2022   CHOLHDL 5.2 (H) 07/06/2022     3. Statin intolerance Can not tolerate statin. She refuses repatha  4. S/P AVR (aortic valve replacement) Last saw cardiology 12/29/22. No change in plan of care according to office note.  5. Severe obesity (BMI >= 40) (HCC) No recent weight changes Wt Readings from Last 3 Encounters:  02/05/23 207 lb (93.9 kg)  01/01/23 208 lb 3.2 oz (94.4 kg)  12/29/22 208 lb 6.4 oz (94.5 kg)   BMI Readings from Last 3 Encounters:  02/05/23 43.26 kg/m  01/01/23 43.51 kg/m  12/29/22 43.56 kg/m       New complaints: None today  Allergies  Allergen Reactions   Crestor [Rosuvastatin]     Dizziness   Lipitor [Atorvastatin]     Unable to stand or move   Meloxicam     Made patient dizzy and caused her heart to race but can take a half tablet   Statins     Leg cramps    Penicillins Rash    Has patient had a PCN reaction causing immediate rash, facial/tongue/throat swelling, SOB or lightheadedness with hypotension: Yes Has patient had a PCN reaction causing severe rash involving mucus membranes or skin necrosis:  No Has patient had a PCN reaction that required hospitalization No Has patient had a PCN reaction occurring within the last 10 years: No If all of the above answers are "NO", then may proceed with Cephalosporin use.    Outpatient Encounter Medications as of 02/05/2023  Medication Sig   acetaminophen (TYLENOL) 325 MG tablet Take 2 tablets (650 mg total) by mouth every 6 (six) hours as needed for mild pain.   allopurinol (ZYLOPRIM) 100 MG tablet Take 1 tab po daily x 7 days, then take 2 tabs po daily x 7 days. Then take 3 tabs po daily.   aspirin EC 81 MG tablet Take 1 tablet (81 mg total) by mouth daily.   Calcium Carb-Cholecalciferol 600-800 MG-UNIT TABS Take 1 tablet by mouth daily.   cetirizine (ZYRTEC) 10 MG tablet TAKE 1 TABLET EVERY DAY   Cholecalciferol (VITAMIN D) 2000 units CAPS Take 2,000 Units by mouth daily.   colchicine 0.6 MG tablet Take 1 tablet (0.6 mg total) by mouth daily.   lisinopril (ZESTRIL) 20 MG tablet Take 1 tablet (20 mg total) by mouth daily.   meloxicam (MOBIC) 15 MG tablet TAKE 1/2 TABLET EVERY DAY (Patient taking differently: Take 7.5 mg by mouth daily as needed for pain.)   metoprolol tartrate (LOPRESSOR) 25 MG tablet Take  1 tablet (25 mg total) by mouth 2 (two) times daily.   Red Yeast Rice 600 MG TABS Take 1 tablet by mouth daily.   No facility-administered encounter medications on file as of 02/05/2023.    Past Surgical History:  Procedure Laterality Date   AORTIC VALVE REPLACEMENT N/A 01/17/2016   Procedure: AORTIC VALVE REPLACEMENT (AVR) USING EDWARDS MAGNA EASE PERICARDIAL BIOPROSTHESIS VALVE;  Surgeon: Delight Ovens, MD;  Location: [facility] OR;  Service: Open Heart Surgery;  Laterality: N/A;   CARDIAC CATHETERIZATION N/A 09/24/2015   Procedure: Right/Left Heart Cath and Coronary Angiography;  Surgeon: Corky Crafts, MD;  Location: [facility] INVASIVE CV LAB;  Service: Cardiovascular;  Laterality: N/A;   COLONOSCOPY     REPLACEMENT ASCENDING AORTA N/A  01/17/2016   Procedure: REPLACEMENT ASCENDING AORTA USING HEMASHIELD PLATINUM WOVEN DOUBLE VELOUR VASCULAR GRAFT;  Surgeon: Delight Ovens, MD;  Location: [facility] OR;  Service: Open Heart Surgery;  Laterality: N/A;   TEE WITHOUT CARDIOVERSION N/A 01/17/2016   Procedure: TRANSESOPHAGEAL ECHOCARDIOGRAM (TEE);  Surgeon: Delight Ovens, MD;  Location: [facility] OR;  Service: Open Heart Surgery;  Laterality: N/A;   TUBAL LIGATION      Family History  Problem Relation Age of Onset   Alzheimer's disease Mother    Heart disease Mother    Heart attack Father 5   Post-traumatic stress disorder Brother    Breast cancer Maternal Aunt    Heart attack Son    Other Son        5 back surgeries      Controlled substance contract: n/a     Review of Systems  Constitutional:  Negative for diaphoresis.  Eyes:  Negative for pain.  Respiratory:  Negative for shortness of breath.   Cardiovascular:  Negative for chest pain, palpitations and leg swelling.  Gastrointestinal:  Negative for abdominal pain.  Endocrine: Negative for polydipsia.  Skin:  Negative for rash.  Neurological:  Negative for dizziness, weakness and headaches.  Hematological:  Does not bruise/bleed easily.  All other systems reviewed and are negative.      Objective:   Physical Exam Vitals and nursing note reviewed.  Constitutional:      General: She is not in acute distress.    Appearance: Normal appearance. She is well-developed.  HENT:     Head: Normocephalic.     Right Ear: Tympanic membrane normal.     Left Ear: Tympanic membrane normal.     Nose: Nose normal.     Mouth/Throat:     Mouth: Mucous membranes are moist.  Eyes:     Pupils: Pupils are equal, round, and reactive to light.  Neck:     Vascular: No carotid bruit or JVD.  Cardiovascular:     Rate and Rhythm: Normal rate and regular rhythm.     Heart sounds: Normal heart sounds.  Pulmonary:     Effort: Pulmonary effort is normal. No respiratory  distress.     Breath sounds: Normal breath sounds. No wheezing or rales.  Chest:     Chest wall: No tenderness.  Abdominal:     General: Bowel sounds are normal. There is no distension or abdominal bruit.     Palpations: Abdomen is soft. There is no hepatomegaly, splenomegaly, mass or pulsatile mass.     Tenderness: There is no abdominal tenderness.  Musculoskeletal:        General: Normal range of motion.     Cervical back: Normal range of motion and neck supple.  Right lower leg: No edema.     Left lower leg: No edema.  Lymphadenopathy:     Cervical: No cervical adenopathy.  Skin:    General: Skin is warm and dry.  Neurological:     Mental Status: She is alert and oriented to person, place, and time.     Deep Tendon Reflexes: Reflexes are normal and symmetric.  Psychiatric:        Behavior: Behavior normal.        Thought Content: Thought content normal.        Judgment: Judgment normal.    BP (!) 160/93   Pulse 80   Temp (!) 97.5 F (36.4 C) (Temporal)   Resp 20   Ht 4\' 10"  (1.473 m)   Wt 207 lb (93.9 kg)   SpO2 96%   BMI 43.26 kg/m         Assessment & Plan:   Angela Dawson comes in today with chief complaint of Medical Management of Chronic Issues   Diagnosis and orders addressed:  1. Primary hypertension Low sodium diet - lisinopril (ZESTRIL) 20 MG tablet; Take 1 tablet (20 mg total) by mouth daily.  Dispense: 90 tablet; Refill: 1 - CBC with Differential/Platelet - CMP14+EGFR  2. Mixed hyperlipidemia Low fat diet - Lipid panel  3. Statin intolerance Will make appt with clinical pharmacist  4. S/P AVR (aortic valve replacement) Keep follow up with cardiology - metoprolol tartrate (LOPRESSOR) 25 MG tablet; Take 1 tablet (25 mg total) by mouth 2 (two) times daily.  Dispense: 180 tablet; Refill: 1  5. Severe obesity (BMI >= 40) (HCC) Discussed diet and exercise for person with BMI >25 Will recheck weight in 3-6 months    Labs  pending Health Maintenance reviewed Diet and exercise encouraged  Follow up plan: 6 months   Mary-Margaret Daphine Deutscher, FNP

## 2023-02-06 ENCOUNTER — Other Ambulatory Visit: Payer: Self-pay

## 2023-02-06 DIAGNOSIS — E782 Mixed hyperlipidemia: Secondary | ICD-10-CM

## 2023-02-06 LAB — CBC WITH DIFFERENTIAL/PLATELET
Basophils Absolute: 0.1 10*3/uL (ref 0.0–0.2)
Basos: 1 %
EOS (ABSOLUTE): 0.3 10*3/uL (ref 0.0–0.4)
Eos: 3 %
Hematocrit: 45.4 % (ref 34.0–46.6)
Hemoglobin: 14.8 g/dL (ref 11.1–15.9)
Immature Grans (Abs): 0 10*3/uL (ref 0.0–0.1)
Immature Granulocytes: 0 %
Lymphocytes Absolute: 4.2 10*3/uL — ABNORMAL HIGH (ref 0.7–3.1)
Lymphs: 44 %
MCH: 32.5 pg (ref 26.6–33.0)
MCHC: 32.6 g/dL (ref 31.5–35.7)
MCV: 100 fL — ABNORMAL HIGH (ref 79–97)
Monocytes Absolute: 0.7 10*3/uL (ref 0.1–0.9)
Monocytes: 7 %
Neutrophils Absolute: 4.4 10*3/uL (ref 1.4–7.0)
Neutrophils: 45 %
Platelets: 219 10*3/uL (ref 150–450)
RBC: 4.55 x10E6/uL (ref 3.77–5.28)
RDW: 12.3 % (ref 11.7–15.4)
WBC: 9.6 10*3/uL (ref 3.4–10.8)

## 2023-02-06 LAB — CMP14+EGFR
ALT: 18 [IU]/L (ref 0–32)
AST: 24 [IU]/L (ref 0–40)
Albumin: 4.2 g/dL (ref 3.8–4.8)
Alkaline Phosphatase: 73 [IU]/L (ref 44–121)
BUN/Creatinine Ratio: 15 (ref 12–28)
BUN: 14 mg/dL (ref 8–27)
Bilirubin Total: 0.3 mg/dL (ref 0.0–1.2)
CO2: 23 mmol/L (ref 20–29)
Calcium: 9.7 mg/dL (ref 8.7–10.3)
Chloride: 104 mmol/L (ref 96–106)
Creatinine, Ser: 0.94 mg/dL (ref 0.57–1.00)
Globulin, Total: 2.4 g/dL (ref 1.5–4.5)
Glucose: 86 mg/dL (ref 70–99)
Potassium: 4.1 mmol/L (ref 3.5–5.2)
Sodium: 145 mmol/L — ABNORMAL HIGH (ref 134–144)
Total Protein: 6.6 g/dL (ref 6.0–8.5)
eGFR: 63 mL/min/{1.73_m2} (ref >59)

## 2023-02-06 LAB — LIPID PANEL
Chol/HDL Ratio: 4.8 {ratio} — ABNORMAL HIGH (ref 0.0–4.4)
Cholesterol, Total: 211 mg/dL — ABNORMAL HIGH (ref 100–199)
HDL: 44 mg/dL (ref >39)
LDL Chol Calc (NIH): 134 mg/dL — ABNORMAL HIGH (ref 0–99)
Triglycerides: 184 mg/dL — ABNORMAL HIGH (ref 0–149)
VLDL Cholesterol Cal: 33 mg/dL (ref 5–40)

## 2023-02-07 ENCOUNTER — Telehealth: Payer: Self-pay

## 2023-02-07 NOTE — Progress Notes (Unsigned)
   Care Guide Note  02/07/2023 Name: [name] MRN: [id number] DOB: April 29, 1945  Referred by: Bennie Pierini, FNP Reason for referral : Care Coordination (Outreach to schedule with Pharm d )   [name] is a 77 y.o. year old female who is a primary care patient of Bennie Pierini, FNP. LESLEE HOLTON was referred to the pharmacist for assistance related to HLD.    An unsuccessful telephone outreach was attempted today to contact the patient who was referred to the pharmacy team for assistance with medication assistance. Additional attempts will be made to contact the patient.   Penne Lash , RMA     [facility]  [facility], [facility] Guide  Direct Dial: [phone]  Website: [email or web address]

## 2023-02-12 NOTE — Progress Notes (Unsigned)
   Care Guide Note  02/12/2023 Name: Angela Dawson MRN: [id number] DOB: 1945/03/24  Referred by: Bennie Pierini, FNP Reason for referral : Care Coordination (Outreach to schedule with Pharm d )   Angela Dawson is a 77 y.o. year old female who is a primary care patient of Bennie Pierini, FNP. Angela Dawson was referred to the pharmacist for assistance related to HLD.    A second unsuccessful telephone outreach was attempted today to contact the patient who was referred to the pharmacy team for assistance with medication assistance. Additional attempts will be made to contact the patient.  Penne Lash , RMA     [facility]  [facility], [facility] Guide  Direct Dial: [phone]  Website: [email or web address]

## 2023-02-14 NOTE — Progress Notes (Signed)
   Care Guide Note  02/14/2023 Name: Angela Dawson MRN: [id number] DOB: [birth year]  Referred by: Bennie Pierini, FNP Reason for referral : Care Coordination (Outreach to schedule with Pharm d )   Angela Dawson is a 77 y.o. year old female who is a primary care patient of Bennie Pierini, FNP. Angela Dawson was referred to the pharmacist for assistance related to HLD.    A third unsuccessful telephone outreach was attempted today to contact the patient who was referred to the pharmacy team for assistance with medication management. The Population Health team is pleased to engage with this patient at any time in the future upon receipt of referral and should he/she be interested in assistance from the [facility] team.   Penne Lash , RMA     [facility]  [facility], [facility] Guide  Direct Dial: [phone]  Website: [email or web address]

## 2023-02-15 NOTE — Telephone Encounter (Signed)
Please call patient about appointment with clinical pharmacist

## 2023-02-16 ENCOUNTER — Telehealth: Payer: Self-pay | Admitting: Pharmacy Technician

## 2023-02-16 ENCOUNTER — Other Ambulatory Visit (HOSPITAL_COMMUNITY): Payer: Self-pay

## 2023-02-16 MED ORDER — REPATHA 140 MG/ML ~~LOC~~ SOSY: 140.0000 mg | PREFILLED_SYRINGE | SUBCUTANEOUS | 3 refills | Status: DC

## 2023-02-16 NOTE — Telephone Encounter (Signed)
-----   Message from Olene Floss sent at 02/16/2023 11:15 AM EST ----- Please do PA for Repatha. thanks ----- Message ----- From: Sharlene Dory, NP Sent: 02/15/2023   4:37 PM EST To: [facility] Pharmd  Wanted to check with you about her insurance and PCSK9i. Please advise.   Thanks!   Best,  Sharlene Dory, NP

## 2023-02-16 NOTE — Telephone Encounter (Signed)
Pharmacy Patient Advocate Encounter   Received notification from  [facility] charts  that prior authorization for repatha is required/requested.   Insurance verification completed.   The patient is insured through [employer] .   Per test claim: PA required; PA submitted to above mentioned insurance via CoverMyMeds Key/confirmation #/EOC [id number] Status is pending

## 2023-02-16 NOTE — Telephone Encounter (Signed)
Pharmacy Patient Advocate Encounter  Received notification from [employer] that Prior Authorization for repatha has been APPROVED from 03/06/22 to 03/06/23. Ran test claim, Copay is $47.00 one month. This test claim was processed through [facility]- copay amounts may vary at other pharmacies due to pharmacy/plan contracts, or as the patient moves through the different stages of their insurance plan.   PA #/Case ID/Reference #: [id number]

## 2023-02-16 NOTE — Addendum Note (Signed)
Addended by: Sharen Hones on: 02/16/2023 03:57 PM   Modules accepted: Orders

## 2023-02-16 NOTE — Telephone Encounter (Signed)
MyChart message sent to patient.  Advising Repatha was approved through Prior Auth and Rx has been sent to pharmacy

## 2023-02-20 NOTE — Progress Notes (Deleted)
Office Visit Note  Patient: Angela Dawson             Date of Birth: [birth year]           MRN: [id number]             PCP: Bennie Pierini, FNP Referring: Bennie Pierini, * Visit Date: 03/06/2023 Occupation: @GUAROCC @  Subjective:    History of Present Illness: ALYA FRILEY is a 77 y.o. female with history of gout and osteoarthritis.  Patient remains on colchicine 0.6 mg daily and and allopurinol 300 mg daily.   CBC and CMP updated on 02/05/23.  Uric acid was 8.0 on 10/09/22.   Activities of Daily Living:  Patient reports morning stiffness for *** {minute/hour:19697}.   Patient {ACTIONS;DENIES/REPORTS:21021675::"Denies"} nocturnal pain.  Difficulty dressing/grooming: {ACTIONS;DENIES/REPORTS:21021675::"Denies"} Difficulty climbing stairs: {ACTIONS;DENIES/REPORTS:21021675::"Denies"} Difficulty getting out of chair: {ACTIONS;DENIES/REPORTS:21021675::"Denies"} Difficulty using hands for taps, buttons, cutlery, and/or writing: {ACTIONS;DENIES/REPORTS:21021675::"Denies"}  No Rheumatology ROS completed.   PMFS History:  Patient Active Problem List   Diagnosis Date Noted   Statin intolerance 10/03/2019   S/P AVR (aortic valve replacement) 01/17/2016   Ascending aorta dilatation (HCC) 11/20/2013   Hyperlipidemia 09/30/2013   Severe obesity (BMI >= 40) (HCC) 09/30/2013   Hypertension 07/17/2012    Past Medical History:  Diagnosis Date   Allergy    seasonal   Chicken pox    Complication of anesthesia    difficult to wake up after tubal ligations   Dyspnea    with exertion   Heart murmur    History of bronchitis    History of kidney stones    "only one"   Hypertension    Measles     Family History  Problem Relation Age of Onset   Alzheimer's disease Mother    Heart disease Mother    Heart attack Father 30   Post-traumatic stress disorder Brother    Breast cancer Maternal Aunt    Heart attack Son    Other Son        5 back surgeries   Past Surgical  History:  Procedure Laterality Date   AORTIC VALVE REPLACEMENT N/A 01/17/2016   Procedure: AORTIC VALVE REPLACEMENT (AVR) USING EDWARDS MAGNA EASE PERICARDIAL BIOPROSTHESIS VALVE;  Surgeon: Delight Ovens, MD;  Location: [facility] OR;  Service: Open Heart Surgery;  Laterality: N/A;   CARDIAC CATHETERIZATION N/A 09/24/2015   Procedure: Right/Left Heart Cath and Coronary Angiography;  Surgeon: Corky Crafts, MD;  Location: [facility] INVASIVE CV LAB;  Service: Cardiovascular;  Laterality: N/A;   COLONOSCOPY     REPLACEMENT ASCENDING AORTA N/A 01/17/2016   Procedure: REPLACEMENT ASCENDING AORTA USING HEMASHIELD PLATINUM WOVEN DOUBLE VELOUR VASCULAR GRAFT;  Surgeon: Delight Ovens, MD;  Location: [facility] OR;  Service: Open Heart Surgery;  Laterality: N/A;   TEE WITHOUT CARDIOVERSION N/A 01/17/2016   Procedure: TRANSESOPHAGEAL ECHOCARDIOGRAM (TEE);  Surgeon: Delight Ovens, MD;  Location: [facility] OR;  Service: Open Heart Surgery;  Laterality: N/A;   TUBAL LIGATION     Social History   Social History Narrative   Lives alone. Pt states son lives in [place].    Immunization History  Administered Date(s) Administered   Fluad Quad(high Dose 65+) 12/02/2018, 02/05/2020, 12/27/2020, 12/30/2021   Fluad Trivalent(High Dose 65+) 02/05/2023   Influenza, High Dose Seasonal PF 12/01/2015, 03/20/2017, 01/24/2018   Moderna Sars-Covid-2 Vaccination 04/15/2019, 05/13/2019, 01/21/2020   Pneumococcal Conjugate-13 10/11/2015   Pneumococcal Polysaccharide-23 03/20/2017     Objective: Vital Signs: There were no vitals  taken for this visit.   Physical Exam Vitals and nursing note reviewed.  Constitutional:      Appearance: She is well-developed.  HENT:     Head: Normocephalic and atraumatic.  Eyes:     Conjunctiva/sclera: Conjunctivae normal.  Cardiovascular:     Rate and Rhythm: Normal rate and regular rhythm.     Heart sounds: Normal heart sounds.  Pulmonary:     Effort: Pulmonary effort is normal.      Breath sounds: Normal breath sounds.  Abdominal:     General: Bowel sounds are normal.     Palpations: Abdomen is soft.  Musculoskeletal:     Cervical back: Normal range of motion.  Lymphadenopathy:     Cervical: No cervical adenopathy.  Skin:    General: Skin is warm and dry.     Capillary Refill: Capillary refill takes less than 2 seconds.  Neurological:     Mental Status: She is alert and oriented to person, place, and time.  Psychiatric:        Behavior: Behavior normal.      Musculoskeletal Exam: ***  CDAI Exam: CDAI Score: -- Patient Global: --; Provider Global: -- Swollen: --; Tender: -- Joint Exam 03/06/2023   No joint exam has been documented for this visit   There is currently no information documented on the homunculus. Go to the Rheumatology activity and complete the homunculus joint exam.  Investigation: No additional findings.  Imaging: No results found.  Recent Labs: Lab Results  Component Value Date   WBC 9.6 02/05/2023   HGB 14.8 02/05/2023   PLT 219 02/05/2023   NA 145 (H) 02/05/2023   K 4.1 02/05/2023   CL 104 02/05/2023   CO2 23 02/05/2023   GLUCOSE 86 02/05/2023   BUN 14 02/05/2023   CREATININE 0.94 02/05/2023   BILITOT 0.3 02/05/2023   ALKPHOS 73 02/05/2023   AST 24 02/05/2023   ALT 18 02/05/2023   PROT 6.6 02/05/2023   ALBUMIN 4.2 02/05/2023   CALCIUM 9.7 02/05/2023   GFRAA 78 04/27/2020    Speciality Comments: No specialty comments available.  Procedures:  No procedures performed Allergies: Crestor [rosuvastatin], Lipitor [atorvastatin], Meloxicam, Statins, and Penicillins   Assessment / Plan:     Visit Diagnoses: Idiopathic chronic gout of multiple sites without tophus  Polyarthralgia  Rheumatoid factor positive  Chronic pain of both shoulders  Primary osteoarthritis of both hands  Primary osteoarthritis of both hips  Primary osteoarthritis of both feet  DDD (degenerative disc disease),  cervical  Arthropathy of lumbar facet joint  Primary hypertension  Mixed hyperlipidemia  Statin intolerance  Ascending aorta dilatation (HCC)  S/P AVR (aortic valve replacement)  Severe obesity (BMI >= 40) (HCC)  Orders: No orders of the defined types were placed in this encounter.  No orders of the defined types were placed in this encounter.   Face-to-face time spent with patient was *** minutes. Greater than 50% of time was spent in counseling and coordination of care.  Follow-Up Instructions: No follow-ups on file.   Gearldine Bienenstock, PA-C  Note - This record has been created using Dragon software.  Chart creation errors have been sought, but may not always  have been located. Such creation errors do not reflect on  the standard of medical care.

## 2023-03-02 ENCOUNTER — Other Ambulatory Visit (HOSPITAL_COMMUNITY): Payer: Self-pay

## 2023-03-02 ENCOUNTER — Telehealth: Payer: Self-pay | Admitting: Pharmacy Technician

## 2023-03-02 NOTE — Telephone Encounter (Signed)
Pharmacy Patient Advocate Encounter   Received notification from CoverMyMeds that prior authorization for repatha is required/requested.   Insurance verification completed.   The patient is insured through [employer] .   Per test claim: The current 03/02/23 day co-pay is, $47.00- one month.  No PA needed at this time. This test claim was processed through [facility]- copay amounts may vary at other pharmacies due to pharmacy/plan contracts, or as the patient moves through the different stages of their insurance plan.     Prior auth on file extended to 03/05/24

## 2023-03-05 ENCOUNTER — Encounter: Payer: Self-pay | Admitting: Physician Assistant

## 2023-03-05 ENCOUNTER — Ambulatory Visit: Payer: Medicare HMO | Attending: Physician Assistant | Admitting: Physician Assistant

## 2023-03-05 VITALS — BP 176/81 | HR 61 | Resp 14 | Ht 60.0 in | Wt 210.0 lb

## 2023-03-05 DIAGNOSIS — Z789 Other specified health status: Secondary | ICD-10-CM | POA: Diagnosis not present

## 2023-03-05 DIAGNOSIS — M1A09X0 Idiopathic chronic gout, multiple sites, without tophus (tophi): Secondary | ICD-10-CM | POA: Diagnosis not present

## 2023-03-05 DIAGNOSIS — M5030 Other cervical disc degeneration, unspecified cervical region: Secondary | ICD-10-CM

## 2023-03-05 DIAGNOSIS — M19071 Primary osteoarthritis, right ankle and foot: Secondary | ICD-10-CM | POA: Diagnosis not present

## 2023-03-05 DIAGNOSIS — M19041 Primary osteoarthritis, right hand: Secondary | ICD-10-CM

## 2023-03-05 DIAGNOSIS — I10 Essential (primary) hypertension: Secondary | ICD-10-CM | POA: Diagnosis not present

## 2023-03-05 DIAGNOSIS — M47816 Spondylosis without myelopathy or radiculopathy, lumbar region: Secondary | ICD-10-CM | POA: Diagnosis not present

## 2023-03-05 DIAGNOSIS — M19072 Primary osteoarthritis, left ankle and foot: Secondary | ICD-10-CM

## 2023-03-05 DIAGNOSIS — M19042 Primary osteoarthritis, left hand: Secondary | ICD-10-CM

## 2023-03-05 DIAGNOSIS — I77810 Thoracic aortic ectasia: Secondary | ICD-10-CM

## 2023-03-05 DIAGNOSIS — M25511 Pain in right shoulder: Secondary | ICD-10-CM

## 2023-03-05 DIAGNOSIS — G8929 Other chronic pain: Secondary | ICD-10-CM

## 2023-03-05 DIAGNOSIS — R768 Other specified abnormal immunological findings in serum: Secondary | ICD-10-CM | POA: Diagnosis not present

## 2023-03-05 DIAGNOSIS — Z5181 Encounter for therapeutic drug level monitoring: Secondary | ICD-10-CM | POA: Diagnosis not present

## 2023-03-05 DIAGNOSIS — M25512 Pain in left shoulder: Secondary | ICD-10-CM

## 2023-03-05 DIAGNOSIS — E782 Mixed hyperlipidemia: Secondary | ICD-10-CM

## 2023-03-05 DIAGNOSIS — M160 Bilateral primary osteoarthritis of hip: Secondary | ICD-10-CM | POA: Diagnosis not present

## 2023-03-05 DIAGNOSIS — R32 Unspecified urinary incontinence: Secondary | ICD-10-CM

## 2023-03-05 DIAGNOSIS — Z952 Presence of prosthetic heart valve: Secondary | ICD-10-CM

## 2023-03-05 MED ORDER — COLCHICINE 0.6 MG PO TABS: 0.6000 mg | ORAL_TABLET | Freq: Every day | ORAL | 0 refills | Status: DC

## 2023-03-05 MED ORDER — ALLOPURINOL 300 MG PO TABS: 300.0000 mg | ORAL_TABLET | Freq: Every day | ORAL | 0 refills | Status: DC

## 2023-03-05 NOTE — Progress Notes (Signed)
Office Visit Note  Patient: Angela Dawson             Date of Birth: 06-01-1945           MRN: [id number]             PCP: Bennie Pierini, FNP Referring: Bennie Pierini, * Visit Date: 03/05/2023 Occupation: @GUAROCC @  Subjective:  Medication monitoring   History of Present Illness: Angela Dawson is a 77 y.o. female with history of gout and osteoarthritis.  Patient was last seen in the office on 01/01/2023.  At that time the patient was advised to initiate allopurinol 100 mg daily x 1 week, 200 mg daily x 1 week, then increase to 300 mg daily.  Patient completed the prescription of allopurinol as prescribed but did not receive a refill of allopurinol and has discontinued.  Patient states she also took colchicine 0.6 mg 1 tablet daily until about 3 weeks ago until she ran out of the prescription.  Patient states that she tolerates both colchicine and allopurinol without any side effects.  She continues to have ongoing pain and stiffness involving both hands and both feet but denies any active gout flares at this time.  States that she has been experiencing issues with incontinence.  She has not yet followed up with her PCP for further evaluation.  He was curious if gout could cause incontinence.      Activities of Daily Living:  Patient reports morning stiffness for 1 hour.   Patient Reports nocturnal pain.  Difficulty dressing/grooming: Denies Difficulty climbing stairs: Reports Difficulty getting out of chair: Reports Difficulty using hands for taps, buttons, cutlery, and/or writing: Reports  Review of Systems  Constitutional:  Negative for fatigue.  HENT:  Negative for mouth sores and mouth dryness.   Eyes:  Negative for dryness.  Respiratory:  Positive for shortness of breath.   Cardiovascular:  Negative for chest pain and palpitations.  Gastrointestinal:  Negative for blood in stool, constipation and diarrhea.  Endocrine: Positive for increased urination.   Genitourinary:  Positive for involuntary urination.  Musculoskeletal:  Positive for joint pain, joint pain, joint swelling, myalgias, morning stiffness, muscle tenderness and myalgias. Negative for gait problem and muscle weakness.  Skin:  Negative for color change, rash, hair loss and sensitivity to sunlight.  Allergic/Immunologic: Negative for susceptible to infections.  Neurological:  Negative for dizziness and headaches.  Hematological:  Negative for swollen glands.  Psychiatric/Behavioral:  Negative for depressed mood and sleep disturbance. The patient is not nervous/anxious.     PMFS History:  Patient Active Problem List   Diagnosis Date Noted   Statin intolerance 10/03/2019   S/P AVR (aortic valve replacement) 01/17/2016   Ascending aorta dilatation (HCC) 11/20/2013   Hyperlipidemia 09/30/2013   Severe obesity (BMI >= 40) (HCC) 09/30/2013   Hypertension 07/17/2012    Past Medical History:  Diagnosis Date   Allergy    seasonal   Chicken pox    Complication of anesthesia    difficult to wake up after tubal ligations   Dyspnea    with exertion   Heart murmur    History of bronchitis    History of kidney stones    "only one"   Hypertension    Measles     Family History  Problem Relation Age of Onset   Alzheimer's disease Mother    Heart disease Mother    Heart attack Father 4   Post-traumatic stress disorder Brother    Breast cancer  Maternal Aunt    Heart attack Son    Other Son        5 back surgeries   Past Surgical History:  Procedure Laterality Date   AORTIC VALVE REPLACEMENT N/A 01/17/2016   Procedure: AORTIC VALVE REPLACEMENT (AVR) USING EDWARDS MAGNA EASE PERICARDIAL BIOPROSTHESIS VALVE;  Surgeon: Delight Ovens, MD;  Location: [facility] OR;  Service: Open Heart Surgery;  Laterality: N/A;   CARDIAC CATHETERIZATION N/A 09/24/2015   Procedure: Right/Left Heart Cath and Coronary Angiography;  Surgeon: Corky Crafts, MD;  Location: [facility] INVASIVE CV LAB;   Service: Cardiovascular;  Laterality: N/A;   COLONOSCOPY     REPLACEMENT ASCENDING AORTA N/A 01/17/2016   Procedure: REPLACEMENT ASCENDING AORTA USING HEMASHIELD PLATINUM WOVEN DOUBLE VELOUR VASCULAR GRAFT;  Surgeon: Delight Ovens, MD;  Location: [facility] OR;  Service: Open Heart Surgery;  Laterality: N/A;   TEE WITHOUT CARDIOVERSION N/A 01/17/2016   Procedure: TRANSESOPHAGEAL ECHOCARDIOGRAM (TEE);  Surgeon: Delight Ovens, MD;  Location: [facility] OR;  Service: Open Heart Surgery;  Laterality: N/A;   TUBAL LIGATION     Social History   Social History Narrative   Lives alone. Pt states son lives in [place].    Immunization History  Administered Date(s) Administered   Fluad Quad(high Dose 65+) 12/02/2018, 02/05/2020, 12/27/2020, 12/30/2021   Fluad Trivalent(High Dose 65+) 02/05/2023   Influenza, High Dose Seasonal PF 12/01/2015, 03/20/2017, 01/24/2018   Moderna Sars-Covid-2 Vaccination 04/15/2019, 05/13/2019, 01/21/2020   Pneumococcal Conjugate-13 10/11/2015   Pneumococcal Polysaccharide-23 03/20/2017     Objective: Vital Signs: BP (!) 176/81 (BP Location: Right Arm, Patient Position: Sitting, Cuff Size: Normal)   Pulse 61   Resp 14   Ht 5' (1.524 m)   Wt 210 lb (95.3 kg)   BMI 41.01 kg/m    Physical Exam Vitals and nursing note reviewed.  Constitutional:      Appearance: She is well-developed.  HENT:     Head: Normocephalic and atraumatic.  Eyes:     Conjunctiva/sclera: Conjunctivae normal.  Cardiovascular:     Rate and Rhythm: Normal rate and regular rhythm.     Heart sounds: Normal heart sounds.  Pulmonary:     Effort: Pulmonary effort is normal.     Breath sounds: Normal breath sounds.  Abdominal:     General: Bowel sounds are normal.     Palpations: Abdomen is soft.  Musculoskeletal:     Cervical back: Normal range of motion.  Lymphadenopathy:     Cervical: No cervical adenopathy.  Skin:    General: Skin is warm and dry.     Capillary Refill: Capillary  refill takes less than 2 seconds.  Neurological:     Mental Status: She is alert and oriented to person, place, and time.  Psychiatric:        Behavior: Behavior normal.      Musculoskeletal Exam: Patient remained seated during examination today.  C-spine has limited range of motion with lateral rotation.  Shoulder joints, elbow joints, wrist joints, MCPs, PIPs, DIPs have good range of motion with no synovitis.  PIP and DIP thickening noted.  Knee joints have good range of motion no warmth or effusion.  Ankle joints have good range of motion no tenderness or synovitis.  Some tenderness of the right first MTP joint.  CDAI Exam: CDAI Score: -- Patient Global: --; Provider Global: -- Swollen: --; Tender: -- Joint Exam 03/05/2023   No joint exam has been documented for this visit   There is  currently no information documented on the homunculus. Go to the Rheumatology activity and complete the homunculus joint exam.  Investigation: No additional findings.  Imaging: No results found.  Recent Labs: Lab Results  Component Value Date   WBC 9.6 02/05/2023   HGB 14.8 02/05/2023   PLT 219 02/05/2023   NA 145 (H) 02/05/2023   K 4.1 02/05/2023   CL 104 02/05/2023   CO2 23 02/05/2023   GLUCOSE 86 02/05/2023   BUN 14 02/05/2023   CREATININE 0.94 02/05/2023   BILITOT 0.3 02/05/2023   ALKPHOS 73 02/05/2023   AST 24 02/05/2023   ALT 18 02/05/2023   PROT 6.6 02/05/2023   ALBUMIN 4.2 02/05/2023   CALCIUM 9.7 02/05/2023   GFRAA 78 04/27/2020    Speciality Comments: No specialty comments available.  Procedures:  No procedures performed Allergies: Crestor [rosuvastatin], Lipitor [atorvastatin], Meloxicam, Statins, and Penicillins    Assessment / Plan:     Visit Diagnoses: Idiopathic chronic gout of multiple sites without tophus -  Patient was last seen in the office on 01/01/2023 at which time the plan was for her to initiate allopurinol 100 mg daily x 1 week, 200 mg daily x 1 week,  and then continue 300 mg daily.  She completed the prescription sent to the pharmacy on 01/01/2023 along with colchicine 0.6 mg 1 tablet daily.  She ran out of both prescriptions and did not request a refill.  According to the patient while taking both allopurinol and colchicine she did not have any side effects.  Plan to update CBC, CMP, and uric acid level today.  Discussed the importance of remaining on allopurinol and colchicine as prescribed.  A refill of allopurinol 300 mg 1 tablet by mouth daily was sent to the pharmacy today.  Encouraged patient to resume colchicine several days prior to reinitiating allopurinol.  She voiced understanding.  Advised patient to notify us once she is running low on her prescriptions for both allopurinol and colchicine and refills can be sent to the mail order pharmacy in the future.  A refill of colchicine was also sent to the pharmacy today.  She was advised to notify us if she develops any signs or symptoms of a flare.  She will follow-up in the office in 2 months or sooner if needed. Plan: CBC with Differential/Platelet, COMPLETE METABOLIC PANEL WITH GFR, Uric acid, allopurinol (ZYLOPRIM) 300 MG tablet  Medication monitoring encounter -CBC and CMP updated on 02/05/23.  Uric acid was 8.0 on 10/09/22.   Plan to recheck CBC, CMP, and uric acid today.   Plan: CBC with Differential/Platelet, COMPLETE METABOLIC PANEL WITH GFR, Uric acid, allopurinol (ZYLOPRIM) 300 MG tablet  Rheumatoid factor positive: Rheumatoid factor was 14.8 on 07/06/2022.  No clinical features of rheumatoid arthritis.  No synovitis noted.   Chronic pain of both shoulders: Patient continues to experience intermittent discomfort in both shoulders but has full range of motion on examination today.  Primary osteoarthritis of both hands: She has PIP and DIP thickening consistent with osteoarthritis of both hands.  She continues to experience chronic pain involving both hands.  No synovitis noted on  examination today.  Primary osteoarthritis of both hips: Difficult assess range of motion while in seated position.  No groin pain currently.  Primary osteoarthritis of both feet: Previous x-rays were consistent with early degenerative changes.  Patient continues to experience intermittent discomfort especially in her right foot.  She has some tenderness over the right first MTP joint today.  No inflammation  noted.   DDD (degenerative disc disease), cervical: C-spine has limited range of motion with lateral rotation.  X-rays revealed multilevel spondylosis and facet joint arthropathy.  Arthropathy of lumbar facet joint: Chronic pain.  X-rays showed lumbar facet arthropathy.  Symptoms of radiculopathy at this time.  Other medical conditions are listed as follows:  Urinary incontinence, unspecified type: Patient has been experiencing intermittent incontinence especially with positional changes.  Patient was advised to follow-up with PCP for further evaluation.  Primary hypertension: Blood pressure was 176/81 today in the office.  Patient was advised to monitor blood pressure closely to reach out to PCP if it remains elevated.  Mixed hyperlipidemia  Statin intolerance  Ascending aorta dilatation (HCC)  S/P AVR (aortic valve replacement)  Severe obesity (BMI >= 40) (HCC)  Orders: Orders Placed This Encounter  Procedures   CBC with Differential/Platelet   COMPLETE METABOLIC PANEL WITH GFR   Uric acid   Meds ordered this encounter  Medications   allopurinol (ZYLOPRIM) 300 MG tablet    Sig: Take 1 tablet (300 mg total) by mouth daily.    Dispense:  90 tablet    Refill:  0   colchicine 0.6 MG tablet    Sig: Take 1 tablet (0.6 mg total) by mouth daily.    Dispense:  90 tablet    Refill:  0    Follow-Up Instructions: Return in about 3 months (around 06/03/2023) for Gout.   Gearldine Bienenstock, PA-C  Note - This record has been created using Dragon software.  Chart creation errors  have been sought, but may not always  have been located. Such creation errors do not reflect on  the standard of medical care.

## 2023-03-06 ENCOUNTER — Ambulatory Visit: Payer: Medicare HMO | Admitting: Physician Assistant

## 2023-03-06 DIAGNOSIS — M160 Bilateral primary osteoarthritis of hip: Secondary | ICD-10-CM

## 2023-03-06 DIAGNOSIS — M19042 Primary osteoarthritis, left hand: Secondary | ICD-10-CM

## 2023-03-06 DIAGNOSIS — I10 Essential (primary) hypertension: Secondary | ICD-10-CM

## 2023-03-06 DIAGNOSIS — M19072 Primary osteoarthritis, left ankle and foot: Secondary | ICD-10-CM

## 2023-03-06 DIAGNOSIS — R768 Other specified abnormal immunological findings in serum: Secondary | ICD-10-CM

## 2023-03-06 DIAGNOSIS — M47816 Spondylosis without myelopathy or radiculopathy, lumbar region: Secondary | ICD-10-CM

## 2023-03-06 DIAGNOSIS — Z789 Other specified health status: Secondary | ICD-10-CM

## 2023-03-06 DIAGNOSIS — Z952 Presence of prosthetic heart valve: Secondary | ICD-10-CM

## 2023-03-06 DIAGNOSIS — E782 Mixed hyperlipidemia: Secondary | ICD-10-CM

## 2023-03-06 DIAGNOSIS — G8929 Other chronic pain: Secondary | ICD-10-CM

## 2023-03-06 DIAGNOSIS — M2550 Pain in unspecified joint: Secondary | ICD-10-CM

## 2023-03-06 DIAGNOSIS — M1A09X0 Idiopathic chronic gout, multiple sites, without tophus (tophi): Secondary | ICD-10-CM

## 2023-03-06 DIAGNOSIS — M5030 Other cervical disc degeneration, unspecified cervical region: Secondary | ICD-10-CM

## 2023-03-06 DIAGNOSIS — I77810 Thoracic aortic ectasia: Secondary | ICD-10-CM

## 2023-03-06 LAB — CBC WITH DIFFERENTIAL/PLATELET
Absolute Lymphocytes: 3510 {cells}/uL (ref 850–3900)
Absolute Monocytes: 607 {cells}/uL (ref 200–950)
Basophils Absolute: 49 {cells}/uL (ref 0–200)
Basophils Relative: 0.6 %
Eosinophils Absolute: 221 {cells}/uL (ref 15–500)
Eosinophils Relative: 2.7 %
HCT: 39.8 % (ref 35.0–45.0)
Hemoglobin: 13.5 g/dL (ref 11.7–15.5)
MCH: 32.7 pg (ref 27.0–33.0)
MCHC: 33.9 g/dL (ref 32.0–36.0)
MCV: 96.4 fL (ref 80.0–100.0)
MPV: 9.4 fL (ref 7.5–12.5)
Monocytes Relative: 7.4 %
Neutro Abs: 3813 {cells}/uL (ref 1500–7800)
Neutrophils Relative %: 46.5 %
Platelets: 222 10*3/uL (ref 140–400)
RBC: 4.13 10*6/uL (ref 3.80–5.10)
RDW: 12.2 % (ref 11.0–15.0)
Total Lymphocyte: 42.8 %
WBC: 8.2 10*3/uL (ref 3.8–10.8)

## 2023-03-06 LAB — COMPLETE METABOLIC PANEL WITH GFR
AG Ratio: 1.4 (calc) (ref 1.0–2.5)
ALT: 16 U/L (ref 6–29)
AST: 18 U/L (ref 10–35)
Albumin: 3.9 g/dL (ref 3.6–5.1)
Alkaline phosphatase (APISO): 69 U/L (ref 37–153)
BUN: 15 mg/dL (ref 7–25)
CO2: 30 mmol/L (ref 20–32)
Calcium: 9.3 mg/dL (ref 8.6–10.4)
Chloride: 106 mmol/L (ref 98–110)
Creat: 0.83 mg/dL (ref 0.60–1.00)
Globulin: 2.8 g/dL (ref 1.9–3.7)
Glucose, Bld: 100 mg/dL — ABNORMAL HIGH (ref 65–99)
Potassium: 3.9 mmol/L (ref 3.5–5.3)
Sodium: 144 mmol/L (ref 135–146)
Total Bilirubin: 0.4 mg/dL (ref 0.2–1.2)
Total Protein: 6.7 g/dL (ref 6.1–8.1)
eGFR: 73 mL/min/{1.73_m2} (ref >=60)

## 2023-03-06 LAB — URIC ACID: Uric Acid, Serum: 7.5 mg/dL — ABNORMAL HIGH (ref 2.5–7.0)

## 2023-03-06 NOTE — Progress Notes (Signed)
CBC and CMP WNL  Uric acid remains elevated-7.5--patient will be restarting allopurinol 300 mg daily--refill was sent to the pharmacy yesterday.

## 2023-03-26 ENCOUNTER — Other Ambulatory Visit: Payer: Medicare HMO

## 2023-04-02 ENCOUNTER — Ambulatory Visit: Payer: Medicare HMO | Admitting: Nurse Practitioner

## 2023-04-02 NOTE — Progress Notes (Deleted)
Cardiology Office Note:  .   Date:  12/29/2022 ID:  [name], DOB November 15, 1945, MRN [id number] PCP: Bennie Pierini, FNP  [facility] Providers Cardiologist:  Dina Rich, MD    History of Present Illness: .   JISEL FLEET is a 78 y.o. female with a PMH of aortic stenosis with bicuspid AV and ascending aortic aneurysm, s/p AVR with [employer] pericardial tissue valve 21 mm and supra coronary replacement of ascending aorta in 2017, hypertension, hyperlipidemia, DOE, who presents today for 1 year follow-up.  Last seen by Dr. Dina Rich on October 31, 2021.  Her blood pressure was elevated in the office, BPs at home are at goal.  Was noted that she had a statin allergy and was documented that she had not been interested in statin alternatives.  Today she presents for 1 year follow-up.  Says she had a recent episode of gout, and ever since her flareup she has noticed more shortness of breath with exertion. Denies any chest pain, palpitations, syncope, presyncope, dizziness, orthopnea, PND, swelling or significant weight changes, acute bleeding, or claudication.  Reviewed alternatives to statins, and patient states she is open to alternatives, says she also was open to this in the past.  Tolerating current medications well.  ROS: Negative.  See HPI.  Studies Reviewed: Marland Kitchen    EKG:      Echo 05/2016: Study Conclusions   - Procedure narrative: Transthoracic echocardiography. Image    quality was adequate. The study was technically difficult, as a    result of poor acoustic windows, poor sound wave transmission,    and body habitus.  - Left ventricle: The cavity size was normal. Wall thickness was    increased in a pattern of mild LVH. Systolic function was normal.    The estimated ejection fraction was in the range of 60% to 65%.    Diastolic dysfunction, grade indeterminate. Doppler parameters    are consistent with high ventricular filling pressure.  -  Aortic valve: Bioprosthetic valve [facility] Ease) by report.    Appears to be functioning normally. Peak velocity (S): 202 cm/s.    Mean gradient (S): 10 mm Hg.  - Mitral valve: Calcified annulus.  Right/left heart cath 09/2015: The left ventricular systolic function is normal. Nonobstructive CAD. Normal PA pressures. CI 2.3; PA sat 68%. Dilated ascending aorta.   Continue with plans for AVR and aneurysm repair.   Risk Assessment/Calculations:    No BP recorded.  {Refresh Note OR Click here to enter BP  :1}***  The 10-year ASCVD risk score (Arnett DK, et al., 2019) is: 39.3%   Values used to calculate the score:     Age: 28 years     Sex: Female     Is Non-Hispanic African American: No     Diabetic: No     Tobacco smoker: No     Systolic Blood Pressure: 176 mmHg     Is BP treated: Yes     HDL Cholesterol: 44 mg/dL     Total Cholesterol: 211 mg/dL      Physical Exam:   VS:  There were no vitals taken for this visit.   Wt Readings from Last 3 Encounters:  03/05/23 210 lb (95.3 kg)  02/05/23 207 lb (93.9 kg)  01/01/23 208 lb 3.2 oz (94.4 kg)    GEN: Morbidly obese, 78 year old female in no acute distress NECK: No JVD; No carotid bruits CARDIAC: S1/S2, RRR, no murmurs, rubs, gallops RESPIRATORY:  Clear to auscultation without rales, wheezing or rhonchi  EXTREMITIES:  No edema; No deformity   ASSESSMENT AND PLAN: .    Aortic stenosis with bicuspid AV and ascending aortic aneurysm, s/p AVR with [employer] pericardial tissue valve 21 mm and supra coronary replacement of ascending aorta in 2017, DOE She is due for updated echocardiogram.  She also admits to some recent worsening dyspnea on exertion after her gout flareup.  Will update echocardiogram at this time for evaluation.  Last echocardiogram on file in 2018 revealed normal functioning bioprosthetic valve.  Continue current medication regimen.  2. HLD History of statin intolerance. She confirms to me she is  interested on alternatives.  Discussed PCSK9 inhibitors with her.  She is open to starting one of these. Will route note to clinical pharm D to check with her insurance about starting Repatha/Praluent. Heart healthy diet encouraged.   3. HTN Blood pressure stable. Discussed to monitor BP at home at least 2 hours after medications and sitting for 5-10 minutes.  No medication changes at this time. Heart healthy diet encouraged.      Dispo: Follow-up with me/APP in 3 months or sooner if anything changes.   Signed, Sharlene Dory, NP

## 2023-04-05 ENCOUNTER — Other Ambulatory Visit: Payer: Medicare HMO

## 2023-04-11 ENCOUNTER — Ambulatory Visit: Payer: Medicare HMO | Attending: Nurse Practitioner

## 2023-04-12 ENCOUNTER — Encounter: Payer: Self-pay | Admitting: Nurse Practitioner

## 2023-05-14 ENCOUNTER — Ambulatory Visit: Payer: Medicare HMO | Admitting: Nurse Practitioner

## 2023-05-14 NOTE — Progress Notes (Deleted)
 Cardiology Office Note:  .   Date:  12/29/2022 ID:  [name], DOB November 15, 1945, MRN [id number] PCP: Bennie Pierini, FNP  [facility] Providers Cardiologist:  Dina Rich, MD    History of Present Illness: .   JISEL FLEET is a 78 y.o. female with a PMH of aortic stenosis with bicuspid AV and ascending aortic aneurysm, s/p AVR with [employer] pericardial tissue valve 21 mm and supra coronary replacement of ascending aorta in 2017, hypertension, hyperlipidemia, DOE, who presents today for 1 year follow-up.  Last seen by Dr. Dina Rich on October 31, 2021.  Her blood pressure was elevated in the office, BPs at home are at goal.  Was noted that she had a statin allergy and was documented that she had not been interested in statin alternatives.  Today she presents for 1 year follow-up.  Says she had a recent episode of gout, and ever since her flareup she has noticed more shortness of breath with exertion. Denies any chest pain, palpitations, syncope, presyncope, dizziness, orthopnea, PND, swelling or significant weight changes, acute bleeding, or claudication.  Reviewed alternatives to statins, and patient states she is open to alternatives, says she also was open to this in the past.  Tolerating current medications well.  ROS: Negative.  See HPI.  Studies Reviewed: Marland Kitchen    EKG:      Echo 05/2016: Study Conclusions   - Procedure narrative: Transthoracic echocardiography. Image    quality was adequate. The study was technically difficult, as a    result of poor acoustic windows, poor sound wave transmission,    and body habitus.  - Left ventricle: The cavity size was normal. Wall thickness was    increased in a pattern of mild LVH. Systolic function was normal.    The estimated ejection fraction was in the range of 60% to 65%.    Diastolic dysfunction, grade indeterminate. Doppler parameters    are consistent with high ventricular filling pressure.  -  Aortic valve: Bioprosthetic valve [facility] Ease) by report.    Appears to be functioning normally. Peak velocity (S): 202 cm/s.    Mean gradient (S): 10 mm Hg.  - Mitral valve: Calcified annulus.  Right/left heart cath 09/2015: The left ventricular systolic function is normal. Nonobstructive CAD. Normal PA pressures. CI 2.3; PA sat 68%. Dilated ascending aorta.   Continue with plans for AVR and aneurysm repair.   Risk Assessment/Calculations:    No BP recorded.  {Refresh Note OR Click here to enter BP  :1}***  The 10-year ASCVD risk score (Arnett DK, et al., 2019) is: 39.3%   Values used to calculate the score:     Age: 28 years     Sex: Female     Is Non-Hispanic African American: No     Diabetic: No     Tobacco smoker: No     Systolic Blood Pressure: 176 mmHg     Is BP treated: Yes     HDL Cholesterol: 44 mg/dL     Total Cholesterol: 211 mg/dL      Physical Exam:   VS:  There were no vitals taken for this visit.   Wt Readings from Last 3 Encounters:  03/05/23 210 lb (95.3 kg)  02/05/23 207 lb (93.9 kg)  01/01/23 208 lb 3.2 oz (94.4 kg)    GEN: Morbidly obese, 78 year old female in no acute distress NECK: No JVD; No carotid bruits CARDIAC: S1/S2, RRR, no murmurs, rubs, gallops RESPIRATORY:  Clear to auscultation without rales, wheezing or rhonchi  EXTREMITIES:  No edema; No deformity   ASSESSMENT AND PLAN: .    Aortic stenosis with bicuspid AV and ascending aortic aneurysm, s/p AVR with [employer] pericardial tissue valve 21 mm and supra coronary replacement of ascending aorta in 2017, DOE She is due for updated echocardiogram.  She also admits to some recent worsening dyspnea on exertion after her gout flareup.  Will update echocardiogram at this time for evaluation.  Last echocardiogram on file in 2018 revealed normal functioning bioprosthetic valve.  Continue current medication regimen.  2. HLD History of statin intolerance. She confirms to me she is  interested on alternatives.  Discussed PCSK9 inhibitors with her.  She is open to starting one of these. Will route note to clinical pharm D to check with her insurance about starting Repatha/Praluent. Heart healthy diet encouraged.   3. HTN Blood pressure stable. Discussed to monitor BP at home at least 2 hours after medications and sitting for 5-10 minutes.  No medication changes at this time. Heart healthy diet encouraged.      Dispo: Follow-up with me/APP in 3 months or sooner if anything changes.   Signed, Sharlene Dory, NP

## 2023-05-22 NOTE — Progress Notes (Deleted)
 Office Visit Note  Patient: Angela Dawson             Date of Birth: 1945/10/26           MRN: [id number]             PCP: Bennie Pierini, FNP Referring: Bennie Pierini, * Visit Date: 06/04/2023 Occupation: @GUAROCC @  Subjective:    History of Present Illness: Angela Dawson is a 78 y.o. female with history of gout and osteoarthritis.  She is taking allopurinol 300 mg 1 tablet daily and colchicine 0.6 mg 1 tablet daily.       Activities of Daily Living:  Patient reports morning stiffness for *** {minute/hour:19697}.   Patient {ACTIONS;DENIES/REPORTS:21021675::"Denies"} nocturnal pain.  Difficulty dressing/grooming: {ACTIONS;DENIES/REPORTS:21021675::"Denies"} Difficulty climbing stairs: {ACTIONS;DENIES/REPORTS:21021675::"Denies"} Difficulty getting out of chair: {ACTIONS;DENIES/REPORTS:21021675::"Denies"} Difficulty using hands for taps, buttons, cutlery, and/or writing: {ACTIONS;DENIES/REPORTS:21021675::"Denies"}  No Rheumatology ROS completed.   PMFS History:  Patient Active Problem List   Diagnosis Date Noted   Statin intolerance 10/03/2019   S/P AVR (aortic valve replacement) 01/17/2016   Ascending aorta dilatation (HCC) 11/20/2013   Hyperlipidemia 09/30/2013   Severe obesity (BMI >= 40) (HCC) 09/30/2013   Hypertension 07/17/2012    Past Medical History:  Diagnosis Date   Allergy    seasonal   Chicken pox    Complication of anesthesia    difficult to wake up after tubal ligations   Dyspnea    with exertion   Heart murmur    History of bronchitis    History of kidney stones    "only one"   Hypertension    Measles     Family History  Problem Relation Age of Onset   Alzheimer's disease Mother    Heart disease Mother    Heart attack Father 54   Post-traumatic stress disorder Brother    Breast cancer Maternal Aunt    Heart attack Son    Other Son        5 back surgeries   Past Surgical History:  Procedure Laterality Date   AORTIC VALVE  REPLACEMENT N/A 01/17/2016   Procedure: AORTIC VALVE REPLACEMENT (AVR) USING EDWARDS MAGNA EASE PERICARDIAL BIOPROSTHESIS VALVE;  Surgeon: Delight Ovens, MD;  Location: [facility] OR;  Service: Open Heart Surgery;  Laterality: N/A;   CARDIAC CATHETERIZATION N/A 09/24/2015   Procedure: Right/Left Heart Cath and Coronary Angiography;  Surgeon: Corky Crafts, MD;  Location: [facility] INVASIVE CV LAB;  Service: Cardiovascular;  Laterality: N/A;   COLONOSCOPY     REPLACEMENT ASCENDING AORTA N/A 01/17/2016   Procedure: REPLACEMENT ASCENDING AORTA USING HEMASHIELD PLATINUM WOVEN DOUBLE VELOUR VASCULAR GRAFT;  Surgeon: Delight Ovens, MD;  Location: [facility] OR;  Service: Open Heart Surgery;  Laterality: N/A;   TEE WITHOUT CARDIOVERSION N/A 01/17/2016   Procedure: TRANSESOPHAGEAL ECHOCARDIOGRAM (TEE);  Surgeon: Delight Ovens, MD;  Location: [facility] OR;  Service: Open Heart Surgery;  Laterality: N/A;   TUBAL LIGATION     Social History   Social History Narrative   Lives alone. Pt states son lives in [place].    Immunization History  Administered Date(s) Administered   Fluad Quad(high Dose 65+) 12/02/2018, 02/05/2020, 12/27/2020, 12/30/2021   Fluad Trivalent(High Dose 65+) 02/05/2023   Influenza, High Dose Seasonal PF 12/01/2015, 03/20/2017, 01/24/2018   Moderna Sars-Covid-2 Vaccination 04/15/2019, 05/13/2019, 01/21/2020   Pneumococcal Conjugate-13 10/11/2015   Pneumococcal Polysaccharide-23 03/20/2017     Objective: Vital Signs: There were no vitals taken for this visit.   Physical Exam  Vitals and nursing note reviewed.  Constitutional:      Appearance: She is well-developed.  HENT:     Head: Normocephalic and atraumatic.  Eyes:     Conjunctiva/sclera: Conjunctivae normal.  Cardiovascular:     Rate and Rhythm: Normal rate and regular rhythm.     Heart sounds: Normal heart sounds.  Pulmonary:     Effort: Pulmonary effort is normal.     Breath sounds: Normal breath sounds.  Abdominal:      General: Bowel sounds are normal.     Palpations: Abdomen is soft.  Musculoskeletal:     Cervical back: Normal range of motion.  Lymphadenopathy:     Cervical: No cervical adenopathy.  Skin:    General: Skin is warm and dry.     Capillary Refill: Capillary refill takes less than 2 seconds.  Neurological:     Mental Status: She is alert and oriented to person, place, and time.  Psychiatric:        Behavior: Behavior normal.      Musculoskeletal Exam: ***  CDAI Exam: CDAI Score: -- Patient Global: --; Provider Global: -- Swollen: --; Tender: -- Joint Exam 06/04/2023   No joint exam has been documented for this visit   There is currently no information documented on the homunculus. Go to the Rheumatology activity and complete the homunculus joint exam.  Investigation: No additional findings.  Imaging: No results found.  Recent Labs: Lab Results  Component Value Date   WBC 8.2 03/05/2023   HGB 13.5 03/05/2023   PLT 222 03/05/2023   NA 144 03/05/2023   K 3.9 03/05/2023   CL 106 03/05/2023   CO2 30 03/05/2023   GLUCOSE 100 (H) 03/05/2023   BUN 15 03/05/2023   CREATININE 0.83 03/05/2023   BILITOT 0.4 03/05/2023   ALKPHOS 73 02/05/2023   AST 18 03/05/2023   ALT 16 03/05/2023   PROT 6.7 03/05/2023   ALBUMIN 4.2 02/05/2023   CALCIUM 9.3 03/05/2023   GFRAA 78 04/27/2020    Speciality Comments: No specialty comments available.  Procedures:  No procedures performed Allergies: Crestor [rosuvastatin], Lipitor [atorvastatin], Meloxicam, Statins, and Penicillins   Assessment / Plan:     Visit Diagnoses: Idiopathic chronic gout of multiple sites without tophus  Medication monitoring encounter  Rheumatoid factor positive  Chronic pain of both shoulders  Primary osteoarthritis of both hands  Primary osteoarthritis of both hips  Primary osteoarthritis of both feet  DDD (degenerative disc disease), cervical  Arthropathy of lumbar facet joint  Primary  hypertension  Mixed hyperlipidemia  Statin intolerance  Ascending aorta dilatation (HCC)  S/P AVR (aortic valve replacement)  Severe obesity (BMI >= 40) (HCC)  Orders: No orders of the defined types were placed in this encounter.  No orders of the defined types were placed in this encounter.   Face-to-face time spent with patient was *** minutes. Greater than 50% of time was spent in counseling and coordination of care.  Follow-Up Instructions: No follow-ups on file.   Gearldine Bienenstock, PA-C  Note - This record has been created using Dragon software.  Chart creation errors have been sought, but may not always  have been located. Such creation errors do not reflect on  the standard of medical care.

## 2023-06-04 ENCOUNTER — Ambulatory Visit: Payer: Medicare HMO | Admitting: Physician Assistant

## 2023-06-04 DIAGNOSIS — Z952 Presence of prosthetic heart valve: Secondary | ICD-10-CM

## 2023-06-04 DIAGNOSIS — M19071 Primary osteoarthritis, right ankle and foot: Secondary | ICD-10-CM

## 2023-06-04 DIAGNOSIS — M19041 Primary osteoarthritis, right hand: Secondary | ICD-10-CM

## 2023-06-04 DIAGNOSIS — R768 Other specified abnormal immunological findings in serum: Secondary | ICD-10-CM

## 2023-06-04 DIAGNOSIS — I10 Essential (primary) hypertension: Secondary | ICD-10-CM

## 2023-06-04 DIAGNOSIS — Z789 Other specified health status: Secondary | ICD-10-CM

## 2023-06-04 DIAGNOSIS — Z5181 Encounter for therapeutic drug level monitoring: Secondary | ICD-10-CM

## 2023-06-04 DIAGNOSIS — I77810 Thoracic aortic ectasia: Secondary | ICD-10-CM

## 2023-06-04 DIAGNOSIS — M47816 Spondylosis without myelopathy or radiculopathy, lumbar region: Secondary | ICD-10-CM

## 2023-06-04 DIAGNOSIS — M5030 Other cervical disc degeneration, unspecified cervical region: Secondary | ICD-10-CM

## 2023-06-04 DIAGNOSIS — E782 Mixed hyperlipidemia: Secondary | ICD-10-CM

## 2023-06-04 DIAGNOSIS — M160 Bilateral primary osteoarthritis of hip: Secondary | ICD-10-CM

## 2023-06-04 DIAGNOSIS — M1A09X0 Idiopathic chronic gout, multiple sites, without tophus (tophi): Secondary | ICD-10-CM

## 2023-06-04 DIAGNOSIS — G8929 Other chronic pain: Secondary | ICD-10-CM

## 2023-06-22 ENCOUNTER — Inpatient Hospital Stay (HOSPITAL_COMMUNITY)
Admission: EM | Admit: 2023-06-22 | Discharge: 2023-06-27 | DRG: 066 | Disposition: A | Discharge status: Skilled Nursing Facility | Attending: Internal Medicine | Admitting: Internal Medicine

## 2023-06-22 ENCOUNTER — Other Ambulatory Visit: Payer: Self-pay

## 2023-06-22 ENCOUNTER — Encounter (HOSPITAL_COMMUNITY): Payer: Self-pay

## 2023-06-22 ENCOUNTER — Emergency Department (HOSPITAL_COMMUNITY)

## 2023-06-22 DIAGNOSIS — R29818 Other symptoms and signs involving the nervous system: Secondary | ICD-10-CM | POA: Diagnosis not present

## 2023-06-22 DIAGNOSIS — E66812 Obesity, class 2: Secondary | ICD-10-CM | POA: Diagnosis present

## 2023-06-22 DIAGNOSIS — Z820 Family history of epilepsy and other diseases of the nervous system: Secondary | ICD-10-CM

## 2023-06-22 DIAGNOSIS — Z8673 Personal history of transient ischemic attack (TIA), and cerebral infarction without residual deficits: Secondary | ICD-10-CM

## 2023-06-22 DIAGNOSIS — I4891 Unspecified atrial fibrillation: Secondary | ICD-10-CM | POA: Diagnosis not present

## 2023-06-22 DIAGNOSIS — I6312 Cerebral infarction due to embolism of basilar artery: Principal | ICD-10-CM | POA: Diagnosis present

## 2023-06-22 DIAGNOSIS — I10 Essential (primary) hypertension: Secondary | ICD-10-CM | POA: Diagnosis present

## 2023-06-22 DIAGNOSIS — R946 Abnormal results of thyroid function studies: Secondary | ICD-10-CM | POA: Diagnosis present

## 2023-06-22 DIAGNOSIS — Z79899 Other long term (current) drug therapy: Secondary | ICD-10-CM

## 2023-06-22 DIAGNOSIS — Z888 Allergy status to other drugs, medicaments and biological substances status: Secondary | ICD-10-CM

## 2023-06-22 DIAGNOSIS — E876 Hypokalemia: Secondary | ICD-10-CM | POA: Diagnosis present

## 2023-06-22 DIAGNOSIS — R202 Paresthesia of skin: Secondary | ICD-10-CM | POA: Diagnosis present

## 2023-06-22 DIAGNOSIS — R296 Repeated falls: Secondary | ICD-10-CM | POA: Diagnosis present

## 2023-06-22 DIAGNOSIS — E785 Hyperlipidemia, unspecified: Secondary | ICD-10-CM | POA: Diagnosis present

## 2023-06-22 DIAGNOSIS — Z880 Allergy status to penicillin: Secondary | ICD-10-CM

## 2023-06-22 DIAGNOSIS — S0990XA Unspecified injury of head, initial encounter: Secondary | ICD-10-CM | POA: Diagnosis present

## 2023-06-22 DIAGNOSIS — Y92009 Unspecified place in unspecified non-institutional (private) residence as the place of occurrence of the external cause: Secondary | ICD-10-CM

## 2023-06-22 DIAGNOSIS — Z6839 Body mass index (BMI) 39.0-39.9, adult: Secondary | ICD-10-CM

## 2023-06-22 DIAGNOSIS — Z8249 Family history of ischemic heart disease and other diseases of the circulatory system: Secondary | ICD-10-CM

## 2023-06-22 DIAGNOSIS — Z803 Family history of malignant neoplasm of breast: Secondary | ICD-10-CM

## 2023-06-22 DIAGNOSIS — Z7901 Long term (current) use of anticoagulants: Secondary | ICD-10-CM

## 2023-06-22 DIAGNOSIS — E538 Deficiency of other specified B group vitamins: Secondary | ICD-10-CM | POA: Insufficient documentation

## 2023-06-22 DIAGNOSIS — Z952 Presence of prosthetic heart valve: Secondary | ICD-10-CM

## 2023-06-22 DIAGNOSIS — Z9181 History of falling: Secondary | ICD-10-CM

## 2023-06-22 DIAGNOSIS — Z87442 Personal history of urinary calculi: Secondary | ICD-10-CM

## 2023-06-22 DIAGNOSIS — I6381 Other cerebral infarction due to occlusion or stenosis of small artery: Secondary | ICD-10-CM | POA: Diagnosis not present

## 2023-06-22 DIAGNOSIS — Z751 Person awaiting admission to adequate facility elsewhere: Secondary | ICD-10-CM

## 2023-06-22 DIAGNOSIS — R200 Anesthesia of skin: Secondary | ICD-10-CM | POA: Diagnosis not present

## 2023-06-22 DIAGNOSIS — Z87891 Personal history of nicotine dependence: Secondary | ICD-10-CM

## 2023-06-22 DIAGNOSIS — W010XXA Fall on same level from slipping, tripping and stumbling without subsequent striking against object, initial encounter: Secondary | ICD-10-CM | POA: Diagnosis present

## 2023-06-22 DIAGNOSIS — R9431 Abnormal electrocardiogram [ECG] [EKG]: Secondary | ICD-10-CM | POA: Diagnosis present

## 2023-06-22 DIAGNOSIS — Z1152 Encounter for screening for COVID-19: Secondary | ICD-10-CM

## 2023-06-22 DIAGNOSIS — Z7982 Long term (current) use of aspirin: Secondary | ICD-10-CM

## 2023-06-22 DIAGNOSIS — R29700 NIHSS score 0: Secondary | ICD-10-CM | POA: Diagnosis present

## 2023-06-22 LAB — BASIC METABOLIC PANEL WITH GFR
Anion gap: 13 (ref 5–15)
BUN: 10 mg/dL (ref 8–23)
CO2: 27 mmol/L (ref 22–32)
Calcium: 9.3 mg/dL (ref 8.9–10.3)
Chloride: 101 mmol/L (ref 98–111)
Creatinine, Ser: 0.91 mg/dL (ref 0.44–1.00)
GFR, Estimated: >60 mL/min (ref >60)
Glucose, Bld: 108 mg/dL — ABNORMAL HIGH (ref 70–99)
Potassium: 3.5 mmol/L (ref 3.5–5.1)
Sodium: 141 mmol/L (ref 135–145)

## 2023-06-22 LAB — CBC WITH DIFFERENTIAL/PLATELET
Abs Immature Granulocytes: 0.03 10*3/uL (ref 0.00–0.07)
Basophils Absolute: 0.1 10*3/uL (ref 0.0–0.1)
Basophils Relative: 1 %
Eosinophils Absolute: 0.2 10*3/uL (ref 0.0–0.5)
Eosinophils Relative: 3 %
HCT: 44.6 % (ref 36.0–46.0)
Hemoglobin: 15.3 g/dL — ABNORMAL HIGH (ref 12.0–15.0)
Immature Granulocytes: 0 %
Lymphocytes Relative: 34 %
Lymphs Abs: 3.1 10*3/uL (ref 0.7–4.0)
MCH: 31.7 pg (ref 26.0–34.0)
MCHC: 34.3 g/dL (ref 30.0–36.0)
MCV: 92.5 fL (ref 80.0–100.0)
Monocytes Absolute: 0.8 10*3/uL (ref 0.1–1.0)
Monocytes Relative: 8 %
Neutro Abs: 4.9 10*3/uL (ref 1.7–7.7)
Neutrophils Relative %: 54 %
Platelets: 212 10*3/uL (ref 150–400)
RBC: 4.82 MIL/uL (ref 3.87–5.11)
RDW: 12.5 % (ref 11.5–15.5)
WBC: 9.1 10*3/uL (ref 4.0–10.5)
nRBC: 0 % (ref 0.0–0.2)

## 2023-06-22 LAB — RESP PANEL BY RT-PCR (RSV, FLU A&B, COVID)  RVPGX2
Influenza A by PCR: NEGATIVE
Influenza B by PCR: NEGATIVE
Resp Syncytial Virus by PCR: NEGATIVE
SARS Coronavirus 2 by RT PCR: NEGATIVE

## 2023-06-22 LAB — MAGNESIUM: Magnesium: 1.8 mg/dL (ref 1.7–2.4)

## 2023-06-22 LAB — ETHANOL: Alcohol, Ethyl (B): <10 mg/dL (ref <10)

## 2023-06-22 MED ORDER — IOHEXOL 300 MG/ML  SOLN
75.0000 mL | Freq: Once | INTRAMUSCULAR | Status: DC | PRN
Start: 2023-06-22 — End: 2023-06-22

## 2023-06-22 MED ORDER — SODIUM CHLORIDE 0.9 % IV BOLUS
1000.0000 mL | Freq: Once | INTRAVENOUS | Status: AC
  Administered 2023-06-22: 1000 mL via INTRAVENOUS

## 2023-06-22 MED ORDER — IOHEXOL 350 MG/ML SOLN
75.0000 mL | Freq: Once | INTRAVENOUS | Status: AC | PRN
  Administered 2023-06-22: 75 mL via INTRAVENOUS

## 2023-06-22 NOTE — ED Triage Notes (Signed)
 Pt reports left side jaw numbness and feeling off balance x 3 days.

## 2023-06-23 ENCOUNTER — Observation Stay (HOSPITAL_COMMUNITY)

## 2023-06-23 DIAGNOSIS — I10 Essential (primary) hypertension: Secondary | ICD-10-CM

## 2023-06-23 DIAGNOSIS — I6312 Cerebral infarction due to embolism of basilar artery: Secondary | ICD-10-CM | POA: Diagnosis not present

## 2023-06-23 DIAGNOSIS — R29700 NIHSS score 0: Secondary | ICD-10-CM

## 2023-06-23 DIAGNOSIS — I6523 Occlusion and stenosis of bilateral carotid arteries: Secondary | ICD-10-CM | POA: Diagnosis not present

## 2023-06-23 DIAGNOSIS — I4891 Unspecified atrial fibrillation: Secondary | ICD-10-CM | POA: Diagnosis not present

## 2023-06-23 DIAGNOSIS — R9431 Abnormal electrocardiogram [ECG] [EKG]: Secondary | ICD-10-CM | POA: Diagnosis not present

## 2023-06-23 DIAGNOSIS — E538 Deficiency of other specified B group vitamins: Secondary | ICD-10-CM | POA: Insufficient documentation

## 2023-06-23 DIAGNOSIS — S0990XA Unspecified injury of head, initial encounter: Secondary | ICD-10-CM | POA: Diagnosis not present

## 2023-06-23 DIAGNOSIS — E66812 Obesity, class 2: Secondary | ICD-10-CM | POA: Diagnosis not present

## 2023-06-23 DIAGNOSIS — E876 Hypokalemia: Secondary | ICD-10-CM | POA: Diagnosis present

## 2023-06-23 DIAGNOSIS — Z6839 Body mass index (BMI) 39.0-39.9, adult: Secondary | ICD-10-CM | POA: Diagnosis not present

## 2023-06-23 DIAGNOSIS — Z1152 Encounter for screening for COVID-19: Secondary | ICD-10-CM | POA: Diagnosis not present

## 2023-06-23 DIAGNOSIS — Z9181 History of falling: Secondary | ICD-10-CM

## 2023-06-23 DIAGNOSIS — R296 Repeated falls: Secondary | ICD-10-CM | POA: Diagnosis present

## 2023-06-23 DIAGNOSIS — Z952 Presence of prosthetic heart valve: Secondary | ICD-10-CM | POA: Diagnosis not present

## 2023-06-23 DIAGNOSIS — E785 Hyperlipidemia, unspecified: Secondary | ICD-10-CM | POA: Diagnosis not present

## 2023-06-23 DIAGNOSIS — Z7901 Long term (current) use of anticoagulants: Secondary | ICD-10-CM | POA: Diagnosis not present

## 2023-06-23 LAB — ECHOCARDIOGRAM COMPLETE
AR max vel: 0.93 cm2
AV Area VTI: 1.04 cm2
AV Area mean vel: 1.01 cm2
AV Mean grad: 26.5 mmHg
AV Peak grad: 49.8 mmHg
Ao pk vel: 3.53 m/s
Area-P 1/2: 3.27 cm2
Height: 60 in
S' Lateral: 2.9 cm
Weight: 3200 [oz_av]

## 2023-06-23 LAB — LIPID PANEL
Cholesterol: 169 mg/dL (ref 0–200)
HDL: 40 mg/dL — ABNORMAL LOW
LDL Cholesterol: 106 mg/dL — ABNORMAL HIGH (ref 0–99)
Total CHOL/HDL Ratio: 4.2 ratio
Triglycerides: 114 mg/dL
VLDL: 23 mg/dL (ref 0–40)

## 2023-06-23 LAB — CBC
HCT: 42.5 % (ref 36.0–46.0)
Hemoglobin: 14 g/dL (ref 12.0–15.0)
MCH: 31 pg (ref 26.0–34.0)
MCHC: 32.9 g/dL (ref 30.0–36.0)
MCV: 94 fL (ref 80.0–100.0)
Platelets: 200 10*3/uL (ref 150–400)
RBC: 4.52 MIL/uL (ref 3.87–5.11)
RDW: 12.5 % (ref 11.5–15.5)
WBC: 8.7 10*3/uL (ref 4.0–10.5)
nRBC: 0 % (ref 0.0–0.2)

## 2023-06-23 LAB — COMPREHENSIVE METABOLIC PANEL WITH GFR
ALT: 14 U/L (ref 0–44)
AST: 16 U/L (ref 15–41)
Albumin: 3 g/dL — ABNORMAL LOW (ref 3.5–5.0)
Alkaline Phosphatase: 64 U/L (ref 38–126)
Anion gap: 10 (ref 5–15)
BUN: 10 mg/dL (ref 8–23)
CO2: 26 mmol/L (ref 22–32)
Calcium: 8.9 mg/dL (ref 8.9–10.3)
Chloride: 104 mmol/L (ref 98–111)
Creatinine, Ser: 0.9 mg/dL (ref 0.44–1.00)
GFR, Estimated: >60 mL/min (ref >60)
Glucose, Bld: 98 mg/dL (ref 70–99)
Potassium: 3.2 mmol/L — ABNORMAL LOW (ref 3.5–5.1)
Sodium: 140 mmol/L (ref 135–145)
Total Bilirubin: 0.4 mg/dL (ref 0.0–1.2)
Total Protein: 5.9 g/dL — ABNORMAL LOW (ref 6.5–8.1)

## 2023-06-23 LAB — MAGNESIUM: Magnesium: 1.8 mg/dL (ref 1.7–2.4)

## 2023-06-23 LAB — TSH: TSH: 5.027 u[IU]/mL — ABNORMAL HIGH (ref 0.350–4.500)

## 2023-06-23 LAB — PHOSPHORUS: Phosphorus: 3.7 mg/dL (ref 2.5–4.6)

## 2023-06-23 LAB — HEMOGLOBIN A1C
Hgb A1c MFr Bld: 5.3 % (ref 4.8–5.6)
Mean Plasma Glucose: 105.41 mg/dL

## 2023-06-23 LAB — T4, FREE: Free T4: 0.86 ng/dL (ref 0.61–1.12)

## 2023-06-23 LAB — FOLATE: Folate: 7.8 ng/mL (ref >5.9)

## 2023-06-23 LAB — VITAMIN B12: Vitamin B-12: 108 pg/mL — ABNORMAL LOW (ref 180–914)

## 2023-06-23 MED ORDER — ONDANSETRON HCL 4 MG PO TABS: 4.0000 mg | ORAL_TABLET | Freq: Four times a day (QID) | ORAL | Status: DC | PRN

## 2023-06-23 MED ORDER — SODIUM CHLORIDE 0.9 % IV SOLN: INTRAVENOUS | Status: DC

## 2023-06-23 MED ORDER — MAGNESIUM SULFATE 2 GM/50ML IV SOLN
2.0000 g | Freq: Once | INTRAVENOUS | Status: AC
  Administered 2023-06-23: 2 g via INTRAVENOUS
  Filled 2023-06-23: qty 50

## 2023-06-23 MED ORDER — VITAMIN D 25 MCG (1000 UNIT) PO TABS
2000.0000 [IU] | ORAL_TABLET | Freq: Every day | ORAL | Status: DC
  Administered 2023-06-24 – 2023-06-27 (×4): 2000 [IU] via ORAL
  Filled 2023-06-23 (×4): qty 2

## 2023-06-23 MED ORDER — POTASSIUM CHLORIDE CRYS ER 20 MEQ PO TBCR
40.0000 meq | EXTENDED_RELEASE_TABLET | Freq: Once | ORAL | Status: AC
  Administered 2023-06-23: 40 meq via ORAL
  Filled 2023-06-23: qty 2

## 2023-06-23 MED ORDER — METOPROLOL TARTRATE 25 MG PO TABS
25.0000 mg | ORAL_TABLET | Freq: Two times a day (BID) | ORAL | Status: DC
  Administered 2023-06-23: 25 mg via ORAL
  Filled 2023-06-23: qty 1

## 2023-06-23 MED ORDER — ADULT MULTIVITAMIN W/MINERALS CH
1.0000 | ORAL_TABLET | Freq: Every day | ORAL | Status: DC
  Administered 2023-06-23 – 2023-06-27 (×5): 1 via ORAL
  Filled 2023-06-23 (×5): qty 1

## 2023-06-23 MED ORDER — VITAMIN B-12 1000 MCG PO TABS
1000.0000 ug | ORAL_TABLET | Freq: Every day | ORAL | Status: DC
  Administered 2023-06-24 – 2023-06-27 (×4): 1000 ug via ORAL
  Filled 2023-06-23 (×4): qty 1

## 2023-06-23 MED ORDER — APIXABAN 5 MG PO TABS
5.0000 mg | ORAL_TABLET | Freq: Two times a day (BID) | ORAL | Status: DC
  Administered 2023-06-23 – 2023-06-27 (×10): 5 mg via ORAL
  Filled 2023-06-23 (×10): qty 1

## 2023-06-23 MED ORDER — ACETAMINOPHEN 650 MG RE SUPP: 650.0000 mg | Freq: Four times a day (QID) | RECTAL | Status: DC | PRN

## 2023-06-23 MED ORDER — METOPROLOL TARTRATE 25 MG PO TABS
25.0000 mg | ORAL_TABLET | Freq: Two times a day (BID) | ORAL | Status: DC
  Administered 2023-06-24 – 2023-06-27 (×7): 25 mg via ORAL
  Filled 2023-06-23 (×7): qty 1

## 2023-06-23 MED ORDER — EVOLOCUMAB 140 MG/ML ~~LOC~~ SOSY: 140.0000 mg | PREFILLED_SYRINGE | SUBCUTANEOUS | Status: DC

## 2023-06-23 MED ORDER — CYANOCOBALAMIN 1000 MCG/ML IJ SOLN
1000.0000 ug | Freq: Once | INTRAMUSCULAR | Status: AC
  Administered 2023-06-23: 1000 ug via INTRAMUSCULAR
  Filled 2023-06-23: qty 1

## 2023-06-23 MED ORDER — SODIUM CHLORIDE 0.9 % IV BOLUS: 1000.0000 mL | Freq: Once | INTRAVENOUS | Status: DC

## 2023-06-23 MED ORDER — ACETAMINOPHEN 325 MG PO TABS
650.0000 mg | ORAL_TABLET | Freq: Four times a day (QID) | ORAL | Status: DC | PRN
  Administered 2023-06-27: 650 mg via ORAL
  Filled 2023-06-23: qty 2

## 2023-06-23 MED ORDER — ONDANSETRON HCL 4 MG/2ML IJ SOLN: 4.0000 mg | Freq: Four times a day (QID) | INTRAMUSCULAR | Status: DC | PRN

## 2023-06-23 MED ORDER — OYSTER SHELL CALCIUM/D3 500-5 MG-MCG PO TABS
1.0000 | ORAL_TABLET | Freq: Every day | ORAL | Status: DC
Start: 2023-06-23 — End: 2023-06-27
  Administered 2023-06-24 – 2023-06-27 (×4): 1 via ORAL
  Filled 2023-06-23 (×4): qty 1

## 2023-06-23 MED ORDER — LISINOPRIL 10 MG PO TABS: 20.0000 mg | ORAL_TABLET | Freq: Every day | ORAL | Status: DC

## 2023-06-23 MED ORDER — ALLOPURINOL 100 MG PO TABS
300.0000 mg | ORAL_TABLET | Freq: Every day | ORAL | Status: DC
  Administered 2023-06-25 – 2023-06-27 (×3): 300 mg via ORAL
  Filled 2023-06-23 (×4): qty 3

## 2023-06-23 NOTE — H&P (Signed)
 History and Physical    Patient: Angela Dawson DOB: [birth year] DOA: 06/22/2023 DOS: the patient was seen and examined on 06/23/2023 PCP: Delfina Feller, FNP  Patient coming from: Home  Chief Complaint:  Chief Complaint  Patient presents with   Numbness   HPI: Angela Dawson is a 78 y.o. female with medical history significant of hypertension, obesity, CVA who presents to the emergency department due to complaint of 5-day onset of perioral tingling, imbalance on ambulation which started after sustaining a fall at home.  Patient stated that she stumbled over her dog on Monday (4/14) and fell face down on her cemented porch, she states that she hit her head on the door, both sustaining no loss of consciousness.  She has been having intermittent headaches and tingling to left side of mouth, imbalance with need to hold onto things on ambulation and lightheadedness since onset of symptoms.  She denies chest pain, shortness of breath, nausea, vomiting.  ED Course:  In the emergency department, BP was 148/97, other vital signs are within normal range.  Workup in the ED showed normal CBC except hemoglobin of 15.3, BMP was normal except for blood glucose of 108, magnesium  1.8, alcohol level was undetectable.  Influenza A, B, SARS, Nancyi, RSV was negative. CT angiography of head and neck with and without contrast showed no emergent LVO or proximal hemodynamically significant stenosis. CT of head showed no evidence of acute infarction, hemorrhage, hydrocephalus, extra-axial collection or mass lesion/mass effect. Patient was noted to have new onset atrial fibrillation with rate control.  She was started on Eliquis .  Hospitalist was asked to admit patient for further evaluation and management.  Review of Systems: Review of systems as noted in the HPI. All other systems reviewed and are negative.   Past Medical History:  Diagnosis Date   Allergy    seasonal   Chicken pox     Complication of anesthesia    difficult to wake up after tubal ligations   Dyspnea    with exertion   Heart murmur    History of bronchitis    History of kidney stones    "only one"   Hypertension    Measles    Past Surgical History:  Procedure Laterality Date   AORTIC VALVE REPLACEMENT N/A 01/17/2016   Procedure: AORTIC VALVE REPLACEMENT (AVR) USING EDWARDS MAGNA EASE PERICARDIAL BIOPROSTHESIS VALVE;  Surgeon: Norita Beauvais, MD;  Location: [facility] OR;  Service: Open Heart Surgery;  Laterality: N/A;   CARDIAC CATHETERIZATION N/A 09/24/2015   Procedure: Right/Left Heart Cath and Coronary Angiography;  Surgeon: Lucendia Rusk, MD;  Location[facility] INVASIVE CV LAB;  Service: Cardiovascular;  Laterality: N/A;   COLONOSCOPY     REPLACEMENT ASCENDING AORTA N/A 01/17/2016   Procedure: REPLACEMENT ASCENDING AORTA USING HEMASHIELD PLATINUM WOVEN DOUBLE VELOUR VASCULAR GRAFT;  Surgeon: Norita Beauvais, MD;  Location: [facility] OR;  Service: Open Heart Surgery;  Laterality: N/A;   TEE WITHOUT CARDIOVERSION N/A 01/17/2016   Procedure: TRANSESOPHAGEAL ECHOCARDIOGRAM (TEE);  Surgeon: Norita Beauvais, MD;  Location: [facility] OR;  Service: Open Heart Surgery;  Laterality: N/A;   TUBAL LIGATION      Social History:  reports that she quit smoking about 49 years ago. Her smoking use included cigarettes. She started smoking about 54 years ago. She has a 2.6 pack-year smoking history. She has been exposed to tobacco smoke. She has never used smokeless tobacco. She reports that she does not currently use alcohol. She  reports that she does not use drugs.   Allergies  Allergen Reactions   Crestor  [Rosuvastatin ]     Dizziness   Lipitor [Atorvastatin ]     Unable to stand or move   Meloxicam      Made patient dizzy and caused her heart to race but can take a half tablet   Statins     Leg cramps    Penicillins Rash    Has patient had a PCN reaction causing immediate rash, facial/tongue/throat swelling,  SOB or lightheadedness with hypotension: Yes Has patient had a PCN reaction causing severe rash involving mucus membranes or skin necrosis: No Has patient had a PCN reaction that required hospitalization No Has patient had a PCN reaction occurring within the last 10 years: No If all of the above answers are "NO", then may proceed with Cephalosporin use.     Family History  Problem Relation Age of Onset   Alzheimer's disease Mother    Heart disease Mother    Heart attack Father 84   Post-traumatic stress disorder Brother    Breast cancer Maternal Aunt    Heart attack Son    Other Son        5 back surgeries     Prior to Admission medications   Medication Sig Start Date End Date Taking? Authorizing Provider  acetaminophen  (TYLENOL ) 325 MG tablet Take 2 tablets (650 mg total) by mouth every 6 (six) hours as needed for mild pain. 01/22/16   Barrett, Erin R, PA-C  allopurinol  (ZYLOPRIM ) 300 MG tablet Take 1 tablet (300 mg total) by mouth daily. 03/05/23   Romayne Clubs, PA-C  Ascorbic Acid (VITAMIN C PO) Take by mouth.    [provider]  aspirin  EC 81 MG tablet Take 1 tablet (81 mg total) by mouth daily. 08/20/20   Gaylyn Keas, Mary-Margaret, FNP  Calcium  Carb-Cholecalciferol  600-800 MG-UNIT TABS Take 1 tablet by mouth daily.    [provider]  cetirizine  (ZYRTEC ) 10 MG tablet TAKE 1 TABLET EVERY DAY 08/25/21   Gaylyn Keas, Mary-Margaret, FNP  Cholecalciferol  (VITAMIN D ) 2000 units CAPS Take 2,000 Units by mouth daily.    [provider]  colchicine  0.6 MG tablet Take 1 tablet (0.6 mg total) by mouth daily. 03/05/23   Romayne Clubs, PA-C  Evolocumab  (REPATHA ) 140 MG/ML SOSY Inject 140 mg into the skin every 14 (fourteen) days. Patient not taking: Reported on 03/05/2023 02/16/23   Lasalle Pointer, NP  lisinopril  (ZESTRIL ) 20 MG tablet Take 1 tablet (20 mg total) by mouth daily. 02/05/23   Gaylyn Keas, Mary-Margaret, FNP  meloxicam  (MOBIC ) 15 MG tablet TAKE 1/2 TABLET EVERY  DAY Patient taking differently: Take 7.5 mg by mouth daily as needed for pain. 11/10/21   Gaylyn Keas Mary-Margaret, FNP  metoprolol  tartrate (LOPRESSOR ) 25 MG tablet Take 1 tablet (25 mg total) by mouth 2 (two) times daily. 02/05/23   Gaylyn Keas, Mary-Margaret, FNP  Red Yeast Rice 600 MG TABS Take 1 tablet by mouth daily.    [provider]    Physical Exam: BP (!) 164/84 (BP Location: Left Arm)   Pulse 63   Temp 98.6 F (37 C) (Oral)   Resp 20   Ht 5' (1.524 m)   Wt 90.7 kg   SpO2 98%   BMI 39.06 kg/m   General: 78 y.o. year-old female well developed well nourished in no acute distress.  Alert and oriented x3. HEENT: NCAT, EOMI Neck: Supple, trachea medial Cardiovascular: Irregular rate and rhythm with no rubs or gallops.  No thyromegaly or JVD noted.  No lower extremity edema. 2/4 pulses in all 4 extremities. Respiratory: Clear to auscultation with no wheezes or rales. Good inspiratory effort. Abdomen: Soft, nontender nondistended with normal bowel sounds x4 quadrants. Muskuloskeletal: No cyanosis, clubbing or edema noted bilaterally Neuro: CN II-XII intact, strength 5/5 x 4, sensation, reflexes intact Skin: No ulcerative lesions noted or rashes Psychiatry: Judgement and insight appear normal. Mood is appropriate for condition and setting          Labs on Admission:  Basic Metabolic Panel: Recent Labs  Lab 06/22/23 1921  NA 141  K 3.5  CL 101  CO2 27  GLUCOSE 108*  BUN 10  CREATININE 0.91  CALCIUM  9.3  MG 1.8   Liver Function Tests: No results for input(s): "AST", "ALT", "ALKPHOS", "BILITOT", "PROT", "ALBUMIN " in the last 168 hours. No results for input(s): "LIPASE", "AMYLASE" in the last 168 hours. No results for input(s): "AMMONIA" in the last 168 hours. CBC: Recent Labs  Lab 06/22/23 1921  WBC 9.1  NEUTROABS 4.9  HGB 15.3*  HCT 44.6  MCV 92.5  PLT 212   Cardiac Enzymes: No results for input(s): "CKTOTAL", "CKMB", "CKMBINDEX", "TROPONINI" in the last  168 hours.  BNP (last 3 results) No results for input(s): "BNP" in the last 8760 hours.  ProBNP (last 3 results) No results for input(s): "PROBNP" in the last 8760 hours.  CBG: No results for input(s): "GLUCAP" in the last 168 hours.  Radiological Exams on Admission: CT ANGIO HEAD NECK W WO CM Result Date: 06/22/2023 CLINICAL DATA:  Neuro deficit, acute, stroke suspected left sided facial numbness EXAM: CT ANGIOGRAPHY HEAD AND NECK WITH AND WITHOUT CONTRAST TECHNIQUE: Multidetector CT imaging of the head and neck was performed using the standard protocol during bolus administration of intravenous contrast. Multiplanar CT image reconstructions and MIPs were obtained to evaluate the vascular anatomy. Carotid stenosis measurements (when applicable) are obtained utilizing NASCET criteria, using the distal internal carotid diameter as the denominator. RADIATION DOSE REDUCTION: This exam was performed according to the departmental dose-optimization program which includes automated exposure control, adjustment of the mA and/or kV according to patient size and/or use of iterative reconstruction technique. CONTRAST:  75mL OMNIPAQUE  IOHEXOL  350 MG/ML SOLN COMPARISON:  None Available. FINDINGS: CT HEAD FINDINGS Brain: No evidence of acute infarction, hemorrhage, hydrocephalus, extra-axial collection or mass lesion/mass effect. Remote right basal ganglia perforator infarct. Remote left cerebellar infarct. Patchy white matter hypodensities, compatible with chronic microvascular disease. Vascular: See below. Skull: No acute fracture. Sinuses/Orbits: No acute finding. Other: Clear sinuses.  No acute orbital findings. Review of the MIP images confirms the above findings CTA NECK FINDINGS Aortic arch: Great vessel origins are pain without significant stenosis. Aortic atherosclerosis. Right carotid system: No evidence of dissection, stenosis (50% or greater), or occlusion. Left carotid system: No evidence of dissection,  stenosis (50% or greater), or occlusion. Vertebral arteries: Right dominant. No evidence of dissection, stenosis (50% or greater), or occlusion. Skeleton: No acute fracture on limited assessment. Other neck: No acute abnormality on limited assessment. Upper chest: Visualized lung apices are clear. Review of the MIP images confirms the above findings CTA HEAD FINDINGS Anterior circulation: Bilateral intracranial ICAs, MCAs, and ACAs are patent without proximal hemodynamically significant stenosis. Posterior circulation: Bilateral intradural vertebral arteries, basilar artery and bilateral posterior cerebral arteries are patent without proximal hemodynamically significant stenosis. Venous sinuses: As permitted by contrast timing, patent. Review of the MIP images confirms the above findings IMPRESSION: 1. No emergent large  vessel occlusion or proximal hemodynamically significant stenosis. 2. 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 3. Aortic Atherosclerosis (ICD10-I70.0). Electronically Signed   By: Stevenson Elbe [name]   On: 06/22/2023 22:27    EKG: I independently viewed the EKG done and my findings are as followed: Atrial fibrillation with prolonged QTc of 561 ms  Assessment/Plan Present on Admission:  New onset atrial fibrillation (HCC)  Essential hypertension  Principal Problem:   New onset atrial fibrillation (HCC) Active Problems:   Essential hypertension   Prolonged QT interval   History of recent fall   Obesity, Class II, BMI 35-39.9  New onset atrial fibrillation Continue metoprolol  Continue Eliquis  after negative MRI of brain as described below Echocardiogram in the morning  Left perioral paresthesia r/o CVA This resolved while patient was in the ED. it appears to be associated with sustained fall, however, it would be reasonable to do MRI of brain without contrast to rule out CVA. Further stroke workup only if MRI of brain is positive CT head findings showed no  evidence of acute infarction or hemorrhage CT angiography of head and neck showed no emergent LVO  Prolonged QT interval QTc 561 ms Avoid QT prolonging drugs Magnesium1.8, this will be optimized to reach 2.0 K+ 3.5, this will be optimized to reach 4.0 Repeat EKG in the morning  Essential hypertension It has been several days since onset of symptoms and is out of permissive hypertension range Continue home lisinopril , metoprolol   Recent fall Continue fall precaution Continue PT/OT eval and treat  Obesity class II (BMI 39.06) Patient was counseled about the cardiovascular and metabolic risk of obesity. Patient was counseled for diet control, exercise regimen and weight loss.   DVT prophylaxis: Eliquis   Code Status: Full code  Family Communication: None at bedside  Consults: None  Severity of Illness: The appropriate patient status for this patient is OBSERVATION. Observation status is judged to be reasonable and necessary in order to provide the required intensity of service to ensure the patient's safety. The patient's presenting symptoms, physical exam findings, and initial radiographic and laboratory data in the context of their medical condition is felt to place them at decreased risk for further clinical deterioration. Furthermore, it is anticipated that the patient will be medically stable for discharge from the hospital within 2 midnights of admission.   Author: Taimane Stimmel, DO 06/23/2023 3:44 AM  For on call review [email or web address].

## 2023-06-23 NOTE — Progress Notes (Signed)
   06/23/23 1824  TOC Brief Assessment  Insurance and Status Reviewed  Patient has primary care physician Yes  Home environment has been reviewed Single Family Home  Prior level of function: Independenty  Readmission risk has been reviewed Yes  Transition of care needs transition of care needs identified, TOC will continue to follow   Patient referral for SNF per PT.

## 2023-06-23 NOTE — Progress Notes (Addendum)
 PROGRESS NOTE    Patient: Angela Dawson                            PCP: Delfina Feller, FNP                    DOB: 10-27-1945            DOA: 06/22/2023 QMV:[id number]             DOS: 06/23/2023, 12:48 PM   LOS: 0 days   Date of Service: The patient was seen and examined on 06/23/2023  Subjective:   The patient was seen and examined this morning. Hemodynamically stable.  Denies any headaches or visual changes. No issues overnight .  Brief Narrative:   Angela Dawson is a 78 y.o. female with medical history significant of hypertension, obesity, CVA who presents to the emergency department due to complaint of 5-day onset of perioral tingling, imbalance on ambulation which started after sustaining a fall at home.  Patient stated that she stumbled over her dog on Monday (4/14) and fell face down on her cemented porch, she states that she hit her head on the door, both sustaining no loss of consciousness.  She has been having intermittent headaches and tingling to left side of mouth, imbalance with need to hold onto things on ambulation and lightheadedness since onset of symptoms.  She denies chest pain, shortness of breath, nausea, vomiting.   ED Course: BP was 148/97, other vital signs are within normal range.  Workup in the ED showed normal CBC except hemoglobin of 15.3, BMP was normal except for blood glucose of 108, magnesium  1.8, alcohol level was undetectable.  Influenza A, B, SARS, Nancyi, RSV was negative. CT angiography of head and neck with and without contrast showed no emergent LVO or proximal hemodynamically significant stenosis. CT of head showed no evidence of acute infarction, hemorrhage, hydrocephalus, extra-axial collection or mass lesion/mass effect. Patient was noted to have new onset atrial fibrillation with rate control.  She was started on Eliquis .  Hospitalist was asked to admit patient for further evaluation and management.    Assessment & Plan:   Principal  Problem:   New onset atrial fibrillation (HCC) Active Problems:   Falls   Essential hypertension   Hyperlipidemia   S/P AVR (aortic valve replacement)   Prolonged QT interval   History of recent fall   Obesity, Class II, BMI 35-39.9  Acute CVA Left perioral paresthesia CVA -then on MRI with embolic event in the anterior and posterior circulation  - CVA, continue with neurochecks, no new focal neurological findings -Pending MRI of the brain- Positive for a Recent Embolic Event, with scattered small subacute infarcts in the anterior and posterior circulation. Notable involvement of the left lateral medulla, the central pons. No associated hemorrhage or mass effect. -Echo: - Pertinent labs reviewed, within normal limits, suctioning mildly elevated TSH at 5.02, will obtain free T3 and T4, and B12 level  -CT head reviewed, no evidence of acute infarction or hemorrhage CT angiography of head and neck showed no emergent LVO  -Obtaining PT/OT/speech - Will consult neurology for further evaluation - Optimizing medical management, patient already started on Eliquis ,  with LDL goal of less than 70  We will check her lipid panel-but patient has severe allergy to statins.  Therefore not initiating high-dose statins.  New onset Afib  With controlled, continue home medication of Toprol  Chads Vascor greater  than 2, therefore starting Eliquis   Prolonged QT interval QTc 561 ms Avoid QT prolonging drugs Magnesium1.8, this will be optimized to reach 2.0 K+ 3.5, this will be optimized to reach 4.0 Repeat EKG in the morning   Essential hypertension Continue home lisinopril , metoprolol    Recent fall -Falls at home -no significant trauma Continue fall precaution Continue PT/OT eval and treat   Vitamin B12 deficiency  - Initiating IV then p.o. vitamin B12 supplements  hypokalemia/hypomagnesemia  - Replace, monitor   obesity class II (BMI 39.06) Patient was counseled about the  cardiovascular and metabolic risk of obesity. Patient was counseled for diet control, exercise regimen and weight loss.    --------------------------------------------------------------------------------------------------- Nutritional status:  The patient's BMI is: Body mass index is 39.06 kg/m. I agree with the assessment and plan as outlined   ------------------------------------------------------------------------------------------------------------------------------------------------  DVT prophylaxis:  SCDs Start: 06/23/23 0334 apixaban  (ELIQUIS ) tablet 5 mg   Code Status:   Code Status: Full Code  Family Communication: No family member present at bedside-  they expressed understanding and agreement of above. -Advance care planning has been discussed.   Admission status:   Status is: Observation The patient remains OBS appropriate and will d/c before 2 midnights.   Disposition: From  - home             Planning for discharge in 1-2 days: to   Procedures:   No admission procedures for hospital encounter.   Antimicrobials:  Anti-infectives (From admission, onward)    None        Medication:   allopurinol   300 mg Oral Daily   apixaban   5 mg Oral BID   calcium -vitamin D   1 tablet Oral Daily   cholecalciferol   2,000 Units Oral Daily   [START ON 06/24/2023] metoprolol  tartrate  25 mg Oral BID    acetaminophen  **OR** acetaminophen , ondansetron  **OR** ondansetron  (ZOFRAN ) IV   Objective:   Vitals:   06/23/23 0105 06/23/23 0441 06/23/23 0852 06/23/23 0955  BP: (!) 164/84 138/79 (!) 141/72 128/64  Pulse: 63 87 66 80  Resp: 20     Temp: 98.6 F (37 C) 99 F (37.2 C)  98.2 F (36.8 C)  TempSrc: Oral Oral  Oral  SpO2: 98% 97%  96%  Weight:      Height:        Intake/Output Summary (Last 24 hours) at 06/23/2023 1248 Last data filed at 06/23/2023 0900 Gross per 24 hour  Intake 240 ml  Output --  Net 240 ml   Filed Weights   06/22/23 1857  Weight:  90.7 kg     Physical examination:   Constitution:  Alert, cooperative, no distress,  Appears calm and comfortable  Psychiatric:   Normal and stable mood and affect, cognition intact,   HEENT:        Normocephalic, PERRL, otherwise with in Normal limits  Chest:         Chest symmetric Cardio vascular:  S1/S2, RRR, No murmure, No Rubs or Gallops  pulmonary: Clear to auscultation bilaterally, respirations unlabored, negative wheezes / crackles Abdomen: Soft, non-tender, non-distended, bowel sounds,no masses, no organomegaly Muscular skeletal: Limited exam - in bed, able to move all 4 extremities,   Neuro: CNII-XII intact. , normal motor and sensation, reflexes intact  Extremities: No pitting edema lower extremities, +2 pulses  Skin: Dry, warm to touch, negative for any Rashes, No open wounds Wounds: per nursing documentation   ------------------------------------------------------------------------------------------------------------------------------------------    LABs:     Latest Ref Rng &  Units 06/23/2023    4:28 AM 06/22/2023    7:21 PM 03/05/2023    2:36 PM  CBC  WBC 4.0 - 10.5 K/uL 8.7  9.1  8.2   Hemoglobin 12.0 - 15.0 g/dL 11.9  14.7  82.9   Hematocrit 36.0 - 46.0 % 42.5  44.6  39.8   Platelets 150 - 400 K/uL 200  212  222       Latest Ref Rng & Units 06/23/2023    4:28 AM 06/22/2023    7:21 PM 03/05/2023    2:36 PM  CMP  Glucose 70 - 99 mg/dL 98  562  130   BUN 8 - 23 mg/dL 10  10  15    Creatinine 0.44 - 1.00 mg/dL 8.65  7.84  6.96   Sodium 135 - 145 mmol/L 140  141  144   Potassium 3.5 - 5.1 mmol/L 3.2  3.5  3.9   Chloride 98 - 111 mmol/L 104  101  106   CO2 22 - 32 mmol/L 26  27  30    Calcium  8.9 - 10.3 mg/dL 8.9  9.3  9.3   Total Protein 6.5 - 8.1 g/dL 5.9   6.7   Total Bilirubin 0.0 - 1.2 mg/dL 0.4   0.4   Alkaline Phos 38 - 126 U/L 64     AST 15 - 41 U/L 16   18   ALT 0 - 44 U/L 14   16        Micro Results Recent Results (from the past 240 hours)   Resp panel by RT-PCR (RSV, Flu A&B, Covid) Anterior Nasal Swab     Status: None   Collection Time: 06/22/23  7:56 PM   Specimen: Anterior Nasal Swab  Result Value Ref Range Status   SARS Coronavirus 2 by RT PCR NEGATIVE NEGATIVE Final    Comment: (NOTE) SARS-CoV-2 target nucleic acids are NOT DETECTED.  The SARS-CoV-2 RNA is generally detectable in upper respiratory specimens during the acute phase of infection. The lowest concentration of SARS-CoV-2 viral copies this assay can detect is 138 copies/mL. A negative result does not preclude SARS-Cov-2 infection and should not be used as the sole basis for treatment or other patient management decisions. A negative result may occur with  improper specimen collection/handling, submission of specimen other than nasopharyngeal swab, presence of viral mutation(s) within the areas targeted by this assay, and inadequate number of viral copies(<138 copies/mL). A negative result must be combined with clinical observations, patient history, and epidemiological information. The expected result is Negative.  Fact Sheet for Patients:  BloggerCourse.com  Fact Sheet for Healthcare Providers:  SeriousBroker.it  This test is no t yet approved or cleared by the [place]  FDA and  has been authorized for detection and/or diagnosis of SARS-CoV-2 by FDA under an Emergency Use Authorization (EUA). This EUA will remain  in effect (meaning this test can be used) for the duration of the COVID-19 declaration under Section 564(b)(1) of the Act, 21 U.S.C.section 360bbb-3(b)(1), unless the authorization is terminated  or revoked sooner.       Influenza A by PCR NEGATIVE NEGATIVE Final   Influenza B by PCR NEGATIVE NEGATIVE Final    Comment: (NOTE) The Xpert Xpress SARS-CoV-2/FLU/RSV plus assay is intended as an aid in the diagnosis of influenza from Nasopharyngeal swab specimens and should not be used  as a sole basis for treatment. Nasal washings and aspirates are unacceptable for Xpert Xpress SARS-CoV-2/FLU/RSV testing.  Fact Sheet for Patients:  BloggerCourse.com  Fact Sheet for Healthcare Providers: SeriousBroker.it  This test is not yet approved or cleared by the [place]  FDA and has been authorized for detection and/or diagnosis of SARS-CoV-2 by FDA under an Emergency Use Authorization (EUA). This EUA will remain in effect (meaning this test can be used) for the duration of the COVID-19 declaration under Section 564(b)(1) of the Act, 21 U.S.C. section 360bbb-3(b)(1), unless the authorization is terminated or revoked.     Resp Syncytial Virus by PCR NEGATIVE NEGATIVE Final    Comment: (NOTE) Fact Sheet for Patients: BloggerCourse.com  Fact Sheet for Healthcare Providers: SeriousBroker.it  This test is not yet approved or cleared by the [place]  FDA and has been authorized for detection and/or diagnosis of SARS-CoV-2 by FDA under an Emergency Use Authorization (EUA). This EUA will remain in effect (meaning this test can be used) for the duration of the COVID-19 declaration under Section 564(b)(1) of the Act, 21 U.S.C. section 360bbb-3(b)(1), unless the authorization is terminated or revoked.  Performed at [facility], [address], [place], [place] [address]     Radiology Reports ECHOCARDIOGRAM COMPLETE Result Date: 06/23/2023    ECHOCARDIOGRAM REPORT   Patient Name:   Angela Dawson Date of Exam: 06/23/2023 Medical Rec #:  [id number]      Height:       60.0 in Accession #:    [id number]     Weight:       200.0 lb Date of Birth:  14-Feb-1946       BSA:          1.867 m Patient Age:    77 years       BP:           138/79 mmHg Patient Gender: F              HR:           75 bpm. Exam Location:  [facility] Procedure: 2D Echo, Cardiac Doppler and Color Doppler (Both  Spectral and Color            Flow Doppler were utilized during procedure). Indications:    Atrial Fibrillation  History:        Patient has prior history of Echocardiogram examinations, most                 recent 05/11/2016. Risk Factors:Hypertension, Dyslipidemia and                 Former Smoker.                 Aortic Valve: 21 mm Edwards bioprosthetic valve is present in                 the aortic position. Procedure Date: 01/27/2016.  Sonographer:    Reta Cassis Referring Phys: [id number] OLADAPO ADEFESO IMPRESSIONS  1. Left ventricular ejection fraction, by estimation, is 65 to 70%. The left ventricle has normal function. The left ventricle has no regional wall motion abnormalities. Left ventricular diastolic parameters are consistent with Grade II diastolic dysfunction (pseudonormalization).  2. Right ventricular systolic function is normal. The right ventricular size is mildly enlarged. There is normal pulmonary artery systolic pressure. The estimated right ventricular systolic pressure is 24.0 mmHg.  3. Left atrial size was mildly dilated.  4. The mitral valve is degenerative. Trivial mitral valve regurgitation. No evidence of mitral stenosis.  5. 21 mm bioprosthetic aortic valve with moderate stenosis. Vmax 3.5 m/s, MG 26 mmHG, EOA  1.04 cm2, DI 0.33. The aortic valve has been repaired/replaced. Aortic valve regurgitation is trivial. There is a 21 mm Edwards bioprosthetic valve present in the aortic position. Procedure Date: 01/27/2016. Echo findings are consistent with stenosis of the aortic prosthesis. Aortic valve area, by VTI measures 1.04 cm. Aortic valve mean gradient measures 26.5 mmHg. Aortic valve Vmax measures 3.53 m/s.  6. The inferior vena cava is normal in size with greater than 50% respiratory variability, suggesting right atrial pressure of 3 mmHg. Comparison(s): Changes from prior study are noted. Prosthetic valve stenosis is now present. FINDINGS  Left Ventricle: Left ventricular ejection  fraction, by estimation, is 65 to 70%. The left ventricle has normal function. The left ventricle has no regional wall motion abnormalities. The left ventricular internal cavity size was normal in size. There is  no left ventricular hypertrophy. Left ventricular diastolic parameters are consistent with Grade II diastolic dysfunction (pseudonormalization). Right Ventricle: The right ventricular size is mildly enlarged. No increase in right ventricular wall thickness. Right ventricular systolic function is normal. There is normal pulmonary artery systolic pressure. The tricuspid regurgitant velocity is 2.29  m/s, and with an assumed right atrial pressure of 3 mmHg, the estimated right ventricular systolic pressure is 24.0 mmHg. Left Atrium: Left atrial size was mildly dilated. Right Atrium: Right atrial size was normal in size. Pericardium: There is no evidence of pericardial effusion. Mitral Valve: The mitral valve is degenerative in appearance. Mild to moderate mitral annular calcification. Trivial mitral valve regurgitation. No evidence of mitral valve stenosis. Tricuspid Valve: The tricuspid valve is grossly normal. Tricuspid valve regurgitation is trivial. No evidence of tricuspid stenosis. Aortic Valve: 21 mm bioprosthetic aortic valve with moderate stenosis. Vmax 3.5 m/s, MG 26 mmHG, EOA 1.04 cm2, DI 0.33. The aortic valve has been repaired/replaced. Aortic valve regurgitation is trivial. Aortic valve mean gradient measures 26.5 mmHg. Aortic valve peak gradient measures 49.8 mmHg. Aortic valve area, by VTI measures 1.04 cm. There is a 21 mm Edwards bioprosthetic valve present in the aortic position. Procedure Date: 01/27/2016. Pulmonic Valve: The pulmonic valve was grossly normal. Pulmonic valve regurgitation is not visualized. No evidence of pulmonic stenosis. Aorta: The aortic root and ascending aorta are structurally normal, with no evidence of dilitation. Venous: The inferior vena cava is normal in size  with greater than 50% respiratory variability, suggesting right atrial pressure of 3 mmHg. IAS/Shunts: The atrial septum is grossly normal.  LEFT VENTRICLE PLAX 2D LVIDd:         3.80 cm   Diastology LVIDs:         2.90 cm   LV e' medial:    5.66 cm/s LV PW:         1.10 cm   LV E/e' medial:  21.7 LV IVS:        0.80 cm   LV e' lateral:   9.14 cm/s LVOT diam:     2.00 cm   LV E/e' lateral: 13.5 LV SV:         75 LV SV Index:   40 LVOT Area:     3.14 cm  RIGHT VENTRICLE            IVC RV Basal diam:  4.40 cm    IVC diam: 1.90 cm RV S prime:     8.05 cm/s TAPSE (M-mode): 1.6 cm LEFT ATRIUM             Index        RIGHT ATRIUM  Index LA diam:        4.30 cm 2.30 cm/m   RA Area:     27.00 cm LA Vol (A2C):   87.0 ml 46.61 ml/m  RA Volume:   97.30 ml  52.13 ml/m LA Vol (A4C):   50.7 ml 27.16 ml/m LA Biplane Vol: 66.1 ml 35.41 ml/m  AORTIC VALVE AV Area (Vmax):    0.93 cm AV Area (Vmean):   1.01 cm AV Area (VTI):     1.04 cm AV Vmax:           353.00 cm/s AV Vmean:          233.000 cm/s AV VTI:            0.728 m AV Peak Grad:      49.8 mmHg AV Mean Grad:      26.5 mmHg LVOT Vmax:         105.00 cm/s LVOT Vmean:        74.600 cm/s LVOT VTI:          0.240 m LVOT/AV VTI ratio: 0.33  AORTA Ao Root diam: 2.80 cm Ao Asc diam:  2.80 cm MITRAL VALVE                TRICUSPID VALVE MV Area (PHT): 3.27 cm     TR Peak grad:   21.0 mmHg MV Decel Time: 232 msec     TR Vmax:        229.00 cm/s MV E velocity: 123.00 cm/s MV A velocity: 75.00 cm/s   SHUNTS MV E/A ratio:  1.64         Systemic VTI:  0.24 m                             Systemic Diam: 2.00 cm Jackquelyn Mass MD Electronically signed by Jackquelyn Mass MD Signature Date/Time: 06/23/2023/12:46:07 PM    Final    US  Carotid Bilateral Result Date: 06/23/2023 CLINICAL DATA:  Cerebral infarction, hypertension and hyperlipidemia. EXAM: BILATERAL CAROTID DUPLEX ULTRASOUND TECHNIQUE: Martina Sledge scale imaging, color Doppler and duplex ultrasound were performed of bilateral  carotid and vertebral arteries in the neck. COMPARISON:  None Available. FINDINGS: Criteria: Quantification of carotid stenosis is based on velocity parameters that correlate the residual internal carotid diameter with NASCET-based stenosis levels, using the diameter of the distal internal carotid lumen as the denominator for stenosis measurement. The following velocity measurements were obtained: RIGHT ICA:  94/8 cm/sec CCA:  73/10 cm/sec SYSTOLIC ICA/CCA RATIO:  1.3 ECA:  86 cm/sec LEFT ICA:  75/21 cm/sec CCA:  80/13 cm/sec SYSTOLIC ICA/CCA RATIO:  0.9 ECA:  78 cm/sec RIGHT CAROTID ARTERY: Moderate calcified plaque at the level of the carotid bulb and proximal right ICA. Estimated right ICA stenosis is less than 50%. RIGHT VERTEBRAL ARTERY: Antegrade flow with normal waveform and velocity. LEFT CAROTID ARTERY: Mild calcified plaque at the level of the left carotid bulb and left ICA origin. Estimated left ICA stenosis is less than 50%. LEFT VERTEBRAL ARTERY: Antegrade flow with normal waveform and velocity. IMPRESSION: Calcified plaque at the level of both carotid bulbs and proximal internal carotid arteries, right greater than left. Estimated bilateral ICA stenoses are less than 50%. Electronically Signed   By: Erica Hau [name]   On: 06/23/2023 12:17   MR BRAIN WO CONTRAST Result Date: 06/23/2023 CLINICAL DATA:  78 year old female neurologic deficit, presentation yesterday. EXAM: MRI HEAD WITHOUT CONTRAST TECHNIQUE: Multiplanar, multiecho  pulse sequences of the brain and surrounding structures were obtained without intravenous contrast. COMPARISON:  CT head and CTA head and neck yesterday. FINDINGS: Brain: Scattered small foci of abnormal diffusion in the anterior and posterior circulation. Approximately 4 foci scattered in the right cerebral hemisphere from the posterior MCA to the right PCA territories. Solitary focus in the posterior left temporal lobe. Punctate focus in the central pons (series 5, image  15 and series 7, image 18) and patchy abnormal diffusion in the left lateral medulla (series 5, image 11). These appear isointense to perhaps mildly restricted on ADC. Underlying confluent bilateral cerebral white matter T2 and FLAIR hyperintensity plus scattered small areas of bilateral white matter cystic encephalomalacia. Chronic lacunar infarcts in the bilateral deep gray nuclei. Small chronic infarcts in the bilateral cerebellum. Chronic microhemorrhage in the left insula. Chronic microhemorrhages also in the left middle frontal gyrus, right occipital lobe. No midline shift, mass effect, evidence of mass lesion, ventriculomegaly, extra-axial collection or acute intracranial hemorrhage. Negative pituitary. Vascular: Major intracranial vascular flow voids are preserved. Skull and upper cervical spine: Negative for age visible cervical spine. Visualized bone marrow signal is within normal limits. Sinuses/Orbits: Negative. Other: Mastoids are clear. Visible internal auditory structures appear normal. Negative visible scalp and face. IMPRESSION: 1. Positive for a Recent Embolic Event, with scattered small subacute infarcts in the anterior and posterior circulation. Notable involvement of the left lateral medulla, the central pons. No associated hemorrhage or mass effect. 2. Underlying fairly severe chronic small vessel disease, including a small number of chronic microhemorrhages. Electronically Signed   By: Marlise Simpers [name]   On: 06/23/2023 08:43   CT ANGIO HEAD NECK W WO CM Result Date: 06/22/2023 CLINICAL DATA:  Neuro deficit, acute, stroke suspected left sided facial numbness EXAM: CT ANGIOGRAPHY HEAD AND NECK WITH AND WITHOUT CONTRAST TECHNIQUE: Multidetector CT imaging of the head and neck was performed using the standard protocol during bolus administration of intravenous contrast. Multiplanar CT image reconstructions and MIPs were obtained to evaluate the vascular anatomy. Carotid stenosis measurements (when  applicable) are obtained utilizing NASCET criteria, using the distal internal carotid diameter as the denominator. RADIATION DOSE REDUCTION: This exam was performed according to the departmental dose-optimization program which includes automated exposure control, adjustment of the mA and/or kV according to patient size and/or use of iterative reconstruction technique. CONTRAST:  75mL OMNIPAQUE  IOHEXOL  350 MG/ML SOLN COMPARISON:  None Available. FINDINGS: CT HEAD FINDINGS Brain: No evidence of acute infarction, hemorrhage, hydrocephalus, extra-axial collection or mass lesion/mass effect. Remote right basal ganglia perforator infarct. Remote left cerebellar infarct. Patchy white matter hypodensities, compatible with chronic microvascular disease. Vascular: See below. Skull: No acute fracture. Sinuses/Orbits: No acute finding. Other: Clear sinuses.  No acute orbital findings. Review of the MIP images confirms the above findings CTA NECK FINDINGS Aortic arch: Great vessel origins are pain without significant stenosis. Aortic atherosclerosis. Right carotid system: No evidence of dissection, stenosis (50% or greater), or occlusion. Left carotid system: No evidence of dissection, stenosis (50% or greater), or occlusion. Vertebral arteries: Right dominant. No evidence of dissection, stenosis (50% or greater), or occlusion. Skeleton: No acute fracture on limited assessment. Other neck: No acute abnormality on limited assessment. Upper chest: Visualized lung apices are clear. Review of the MIP images confirms the above findings CTA HEAD FINDINGS Anterior circulation: Bilateral intracranial ICAs, MCAs, and ACAs are patent without proximal hemodynamically significant stenosis. Posterior circulation: Bilateral intradural vertebral arteries, basilar artery and bilateral posterior cerebral arteries are patent without  proximal hemodynamically significant stenosis. Venous sinuses: As permitted by contrast timing, patent. Review of  the MIP images confirms the above findings IMPRESSION: 1. No emergent large vessel occlusion or proximal hemodynamically significant stenosis. 2. 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 3. Aortic Atherosclerosis (ICD10-I70.0). Electronically Signed   By: Stevenson Elbe [name]   On: 06/22/2023 22:27    SIGNED: Bobbetta Burnet, MD, FHM. FAAFP. Arlin Benes - [facility] Time spent - 55 min.  In seeing, evaluating and examining the patient. Reviewing medical records, labs, drawn plan of care. [facility],  Pager (please use [email or web address] to page/ text) Please use Epic Secure Chat for non-urgent communication (7AM-7PM)  If 7PM-7AM, please contact night-coverage [email or web address], 06/23/2023, 12:48 PM

## 2023-06-23 NOTE — ED Provider Notes (Incomplete)
 [place] [facility]MENT AT [facility] Provider Note  CSN: [id number] Arrival date & time: 06/22/23 4098  Chief Complaint(s) Numbness  HPI Angela Dawson is a 78 y.o. female with past medical history as below, significant for AVR, HLD, obesity, hypertension who presents to the ED with complaint of perioral tingling, fatigue, head injury  Reports on Monday she stumbled over her dog, hit her head on the door.  No LOC.  Has been having intermittent headaches since then, intermittent tingling to the left side of her mouth.  Feeling generalized fatigue, some lightheadedness and balance disturbance.  No chest pain or dyspnea, no palpitations.  No nausea or vomiting.  No vision or hearing changes.  Past Medical History Past Medical History:  Diagnosis Date   Allergy    seasonal   Chicken pox    Complication of anesthesia    difficult to wake up after tubal ligations   Dyspnea    with exertion   Heart murmur    History of bronchitis    History of kidney stones    "only one"   Hypertension    Measles    Patient Active Problem List   Diagnosis Date Noted   Statin intolerance 10/03/2019   S/P AVR (aortic valve replacement) 01/17/2016   Ascending aorta dilatation (HCC) 11/20/2013   Hyperlipidemia 09/30/2013   Severe obesity (BMI >= 40) (HCC) 09/30/2013   Hypertension 07/17/2012   Home Medication(s) Prior to Admission medications   Medication Sig Start Date End Date Taking? Authorizing Provider  acetaminophen  (TYLENOL ) 325 MG tablet Take 2 tablets (650 mg total) by mouth every 6 (six) hours as needed for mild pain. 01/22/16   Barrett, Erin R, PA-C  allopurinol  (ZYLOPRIM ) 300 MG tablet Take 1 tablet (300 mg total) by mouth daily. 03/05/23   Romayne Clubs, PA-C  Ascorbic Acid (VITAMIN C PO) Take by mouth.    [provider]  aspirin  EC 81 MG tablet Take 1 tablet (81 mg total) by mouth daily. 08/20/20   Gaylyn Keas Mary-Margaret, FNP  Calcium  Carb-Cholecalciferol   600-800 MG-UNIT TABS Take 1 tablet by mouth daily.    [provider]  cetirizine  (ZYRTEC ) 10 MG tablet TAKE 1 TABLET EVERY DAY 08/25/21   Gaylyn Keas, Mary-Margaret, FNP  Cholecalciferol  (VITAMIN D ) 2000 units CAPS Take 2,000 Units by mouth daily.    [provider]  colchicine  0.6 MG tablet Take 1 tablet (0.6 mg total) by mouth daily. 03/05/23   Romayne Clubs, PA-C  Evolocumab  (REPATHA ) 140 MG/ML SOSY Inject 140 mg into the skin every 14 (fourteen) days. Patient not taking: Reported on 03/05/2023 02/16/23   Lasalle Pointer, NP  lisinopril  (ZESTRIL ) 20 MG tablet Take 1 tablet (20 mg total) by mouth daily. 02/05/23   Gaylyn Keas, Mary-Margaret, FNP  meloxicam  (MOBIC ) 15 MG tablet TAKE 1/2 TABLET EVERY DAY Patient taking differently: Take 7.5 mg by mouth daily as needed for pain. 11/10/21   Delfina Feller, FNP  metoprolol  tartrate (LOPRESSOR ) 25 MG tablet Take 1 tablet (25 mg total) by mouth 2 (two) times daily. 02/05/23   Gaylyn Keas, Mary-Margaret, FNP  Red Yeast Rice 600 MG TABS Take 1 tablet by mouth daily.    [provider]  Past Surgical History Past Surgical History:  Procedure Laterality Date   AORTIC VALVE REPLACEMENT N/A 01/17/2016   Procedure: AORTIC VALVE REPLACEMENT (AVR) USING EDWARDS MAGNA EASE PERICARDIAL BIOPROSTHESIS VALVE;  Surgeon: Norita Beauvais, MD;  Location: [facility] OR;  Service: Open Heart Surgery;  Laterality: N/A;   CARDIAC CATHETERIZATION N/A 09/24/2015   Procedure: Right/Left Heart Cath and Coronary Angiography;  Surgeon: Lucendia Rusk, MD;  Location: [facility] INVASIVE CV LAB;  Service: Cardiovascular;  Laterality: N/A;   COLONOSCOPY     REPLACEMENT ASCENDING AORTA N/A 01/17/2016   Procedure: REPLACEMENT ASCENDING AORTA USING HEMASHIELD PLATINUM WOVEN DOUBLE VELOUR VASCULAR GRAFT;  Surgeon: Norita Beauvais, MD;   Location: [facility] OR;  Service: Open Heart Surgery;  Laterality: N/A;   TEE WITHOUT CARDIOVERSION N/A 01/17/2016   Procedure: TRANSESOPHAGEAL ECHOCARDIOGRAM (TEE);  Surgeon: Norita Beauvais, MD;  Location[facility] OR;  Service: Open Heart Surgery;  Laterality: N/A;   TUBAL LIGATION     Family History Family History  Problem Relation Age of Onset   Alzheimer's disease Mother    Heart disease Mother    Heart attack Father 24   Post-traumatic stress disorder Brother    Breast cancer Maternal Aunt    Heart attack Son    Other Son        5 back surgeries    Social History Social History   Tobacco Use   Smoking status: Former    Current packs/day: 0.00    Average packs/day: 0.5 packs/day for 5.2 years (2.6 ttl pk-yrs)    Types: Cigarettes    Start date: 08/04/1968    Quit date: 10/10/1973    Years since quitting: 49.7    Passive exposure: Current   Smokeless tobacco: Never   Tobacco comments:    smoked less than 1 pack every 2 weeks. Havent smoked in atleast 40 years  Vaping Use   Vaping status: Never Used  Substance Use Topics   Alcohol use: Not Currently    Comment: seldom   Drug use: No   Allergies Crestor  [rosuvastatin ], Lipitor [atorvastatin ], Meloxicam , Statins, and Penicillins  Review of Systems A thorough review of systems was obtained and all systems are negative except as noted in the HPI and PMH.   Physical Exam Vital Signs  I have reviewed the triage vital signs BP 133/60   Pulse 75   Temp 98.2 F (36.8 C) (Oral)   Resp 18   Ht 5' (1.524 m)   Wt 90.7 kg   SpO2 97%   BMI 39.06 kg/m  Physical Exam Vitals and nursing note reviewed.  Constitutional:      General: She is not in acute distress.    Appearance: Normal appearance.  HENT:     Head: Normocephalic and atraumatic.     Right Ear: External ear normal.     Left Ear: External ear normal.     Nose: Nose normal.     Mouth/Throat:     Mouth: Mucous membranes are moist.  Eyes:     General: No scleral  icterus.       Right eye: No discharge.        Left eye: No discharge.     Extraocular Movements: Extraocular movements intact.     Pupils: Pupils are equal, round, and reactive to light.  Cardiovascular:     Rate and Rhythm: Normal rate. Rhythm irregular.     Pulses: Normal pulses.     Heart sounds: Normal heart sounds.  Pulmonary:  Effort: Pulmonary effort is normal. No respiratory distress.     Breath sounds: Normal breath sounds. No stridor.  Abdominal:     General: Abdomen is flat. There is no distension.     Palpations: Abdomen is soft.     Tenderness: There is no abdominal tenderness.  Musculoskeletal:     Cervical back: No rigidity.     Right lower leg: No edema.     Left lower leg: No edema.  Skin:    General: Skin is warm and dry.     Capillary Refill: Capillary refill takes less than 2 seconds.  Neurological:     Mental Status: She is alert and oriented to person, place, and time.     GCS: GCS eye subscore is 4. GCS verbal subscore is 5. GCS motor subscore is 6.     Cranial Nerves: Cranial nerves 2-12 are intact. No dysarthria or facial asymmetry.     Sensory: Sensation is intact.     Motor: Motor function is intact. No tremor.     Coordination: Coordination is intact. Finger-Nose-Finger Test normal.     Comments: Gait testing deferred secondary to patient safety.  Paresthesias left peri-oral   Psychiatric:        Mood and Affect: Mood normal.        Behavior: Behavior normal. Behavior is cooperative.     ED Results and Treatments Labs (all labs ordered are listed, but only abnormal results are displayed) Labs Reviewed  CBC WITH DIFFERENTIAL/PLATELET - Abnormal; Notable for the following components:      Result Value   Hemoglobin 15.3 (*)    All other components within normal limits  BASIC METABOLIC PANEL WITH GFR - Abnormal; Notable for the following components:   Glucose, Bld 108 (*)    All other components within normal limits  RESP PANEL BY RT-PCR  (RSV, FLU A&B, COVID)  RVPGX2  ETHANOL  MAGNESIUM                                                                                                                           Radiology CT ANGIO HEAD NECK W WO CM Result Date: 06/22/2023 CLINICAL DATA:  Neuro deficit, acute, stroke suspected left sided facial numbness EXAM: CT ANGIOGRAPHY HEAD AND NECK WITH AND WITHOUT CONTRAST TECHNIQUE: Multidetector CT imaging of the head and neck was performed using the standard protocol during bolus administration of intravenous contrast. Multiplanar CT image reconstructions and MIPs were obtained to evaluate the vascular anatomy. Carotid stenosis measurements (when applicable) are obtained utilizing NASCET criteria, using the distal internal carotid diameter as the denominator. RADIATION DOSE REDUCTION: This exam was performed according to the departmental dose-optimization program which includes automated exposure control, adjustment of the mA and/or kV according to patient size and/or use of iterative reconstruction technique. CONTRAST:  75mL OMNIPAQUE  IOHEXOL  350 MG/ML SOLN COMPARISON:  None Available. FINDINGS: CT HEAD FINDINGS Brain: No evidence of acute infarction, hemorrhage, hydrocephalus, extra-axial collection or mass lesion/mass effect.  Remote right basal ganglia perforator infarct. Remote left cerebellar infarct. Patchy white matter hypodensities, compatible with chronic microvascular disease. Vascular: See below. Skull: No acute fracture. Sinuses/Orbits: No acute finding. Other: Clear sinuses.  No acute orbital findings. Review of the MIP images confirms the above findings CTA NECK FINDINGS Aortic arch: Great vessel origins are pain without significant stenosis. Aortic atherosclerosis. Right carotid system: No evidence of dissection, stenosis (50% or greater), or occlusion. Left carotid system: No evidence of dissection, stenosis (50% or greater), or occlusion. Vertebral arteries: Right dominant. No evidence of  dissection, stenosis (50% or greater), or occlusion. Skeleton: No acute fracture on limited assessment. Other neck: No acute abnormality on limited assessment. Upper chest: Visualized lung apices are clear. Review of the MIP images confirms the above findings CTA HEAD FINDINGS Anterior circulation: Bilateral intracranial ICAs, MCAs, and ACAs are patent without proximal hemodynamically significant stenosis. Posterior circulation: Bilateral intradural vertebral arteries, basilar artery and bilateral posterior cerebral arteries are patent without proximal hemodynamically significant stenosis. Venous sinuses: As permitted by contrast timing, patent. Review of the MIP images confirms the above findings IMPRESSION: 1. No emergent large vessel occlusion or proximal hemodynamically significant stenosis. 2. 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 3. Aortic Atherosclerosis (ICD10-I70.0). Electronically Signed   By: Stevenson Elbe [name]   On: 06/22/2023 22:27    Pertinent labs & imaging results that were available during my care of the patient were reviewed by me and considered in my medical decision making (see MDM for details).  Medications Ordered in ED Medications  sodium chloride  0.9 % bolus 1,000 mL (1,000 mLs Intravenous New Bag/Given 06/22/23 2125)  iohexol  (OMNIPAQUE ) 350 MG/ML injection 75 mL (75 mLs Intravenous Contrast Given 06/22/23 2109)                                                                                                                                     Procedures Procedures  (including critical care time)  Medical Decision Making / ED Course    Medical Decision Making:    MAKYNA NIEHOFF is a 78 y.o. female ***. The complaint involves an extensive differential diagnosis and also carries with it a high risk of complications and morbidity.  Serious etiology was considered. Ddx includes but is not limited to: ***  Complete initial physical exam performed,  notably the patient was in ***.    Reviewed and confirmed nursing documentation for past medical history, family history, social history.  Vital signs reviewed.    ***  Clinical Course as of 06/23/23 0000  Fri Jun 22, 2023  2100 Afib noted on telemetry and EKG, no afib on prior EKG, no documented hx afib  [SG]  2258 CHADSVASC is 5 [SG]  2258 She remains in afib, rate controlled. Appears to be new onset afib [SG]    Clinical Course User Index [SG] Teddi Favors, DO    Brief summary:  Additional history obtained: -Additional history obtained from {wsadditionalhistorian:28072} -External records from outside source obtained and reviewed including: Chart review including previous notes, labs, imaging, consultation notes including  ***   Lab Tests: -I ordered, reviewed, and interpreted labs.   The pertinent results include:   Labs Reviewed  CBC WITH DIFFERENTIAL/PLATELET - Abnormal; Notable for the following components:      Result Value   Hemoglobin 15.3 (*)    All other components within normal limits  BASIC METABOLIC PANEL WITH GFR - Abnormal; Notable for the following components:   Glucose, Bld 108 (*)    All other components within normal limits  RESP PANEL BY RT-PCR (RSV, FLU A&B, COVID)  RVPGX2  ETHANOL  MAGNESIUM     Notable for ***  EKG   EKG Interpretation Date/Time:  Friday June 22 2023 22:38:25 EDT Ventricular Rate:  88 PR Interval:  217 QRS Duration:  83 QT Interval:  463 QTC Calculation: 561 R Axis:   52  Text Interpretation: Borderline prolonged PR interval Borderline T abnormalities, anterior leads Prolonged QT interval Atrial fibrillation Confirmed by Russella Courts ([id number]) on 06/22/2023 10:56:50 PM         Imaging Studies ordered: I ordered imaging studies including *** I independently visualized the following imaging with scope of interpretation limited to determining acute life threatening conditions related to  emergency care; findings noted above I independently visualized and interpreted imaging. I agree with the radiologist interpretation   Medicines ordered and prescription drug management: Meds ordered this encounter  Medications   DISCONTD: iohexol  (OMNIPAQUE ) 300 MG/ML solution 75 mL   sodium chloride  0.9 % bolus 1,000 mL   iohexol  (OMNIPAQUE ) 350 MG/ML injection 75 mL    -I have reviewed the patients home medicines and have made adjustments as needed   Consultations Obtained: I requested consultation with the ***,  and discussed lab and imaging findings as well as pertinent plan - they recommend: ***   Cardiac Monitoring: The patient was maintained on a cardiac monitor.  I personally viewed and interpreted the cardiac monitored which showed an underlying rhythm of: *** Continuous pulse oximetry interpreted by myself, ***% on ***.    Social Determinants of Health:  Diagnosis or treatment significantly limited by social determinants of health: {wssoc:28071}   Reevaluation: After the interventions noted above, I reevaluated the patient and found that they have {resolved/improved/worsened:23923::"improved"}  Co morbidities that complicate the patient evaluation  Past Medical History:  Diagnosis Date   Allergy    seasonal   Chicken pox    Complication of anesthesia    difficult to wake up after tubal ligations   Dyspnea    with exertion   Heart murmur    History of bronchitis    History of kidney stones    "only one"   Hypertension    Measles       Dispostion: Disposition decision including need for hospitalization was considered, and patient {wsdispo:28070::"discharged from emergency department."}    Final Clinical Impression(s) / ED Diagnoses Final diagnoses:  New onset atrial fibrillation (HCC)  Injury of head, initial encounter

## 2023-06-23 NOTE — Plan of Care (Signed)
  Problem: Education: Goal: Knowledge of General Education information will improve Description: Including pain rating scale, medication(s)/side effects and non-pharmacologic comfort measures Outcome: Progressing   Problem: Health Behavior/Discharge Planning: Goal: Ability to manage health-related needs will improve Outcome: Progressing   Problem: Activity: Goal: Risk for activity intolerance will decrease Outcome: Progressing   Problem: Pain Managment: Goal: General experience of comfort will improve and/or be controlled Outcome: Progressing   Problem: Safety: Goal: Ability to remain free from injury will improve Outcome: Progressing

## 2023-06-23 NOTE — Evaluation (Addendum)
 Physical Therapy Evaluation Patient Details Name: Angela Dawson MRN: [id number] DOB: January 13, 1946 Today's Date: 06/23/2023  History of Present Illness  Angela Dawson is a 78 y.o. female with medical history significant of hypertension, obesity, CVA who presents to the emergency department due to complaint of 5-day onset of perioral tingling, imbalance on ambulation which started after sustaining a fall at home.  Patient stated that she stumbled over her dog on Monday (4/14) and fell face down on her cemented porch, she states that she hit her head on the door, both sustaining no loss of consciousness.  She has been having intermittent headaches and tingling to left side of mouth, imbalance with need to hold onto things on ambulation and lightheadedness since onset of symptoms.  She denies chest pain, shortness of breath, nausea, vomiting.   Clinical Impression  Patient demonstrates labored movement for sitting up at bedside and c/o dizziness one seated with BP at 151/311, dizziness decreased after a few minutes, but patient very unsteady on feet having to lean on bed and chair during transfer to chair, required use of RW for taking steps at bedside and limited mostly due to c/o generalized weakness and mild dizziness. Patient tolerated sitting up in chair after therapy with BP at 149/83, HR at 66 bpm- RN notified. No significant difference noted in BLE strength. Patient will benefit from continued skilled physical therapy in hospital and recommended venue below to increase strength, balance, endurance for safe ADLs and gait.          If plan is discharge home, recommend the following: A lot of help with walking and/or transfers;Help with stairs or ramp for entrance;Assistance with cooking/housework;A lot of help with bathing/dressing/bathroom   Can travel by private vehicle        Equipment Recommendations Rolling walker (2 wheels)  Recommendations for Other Services       Functional Status  Assessment Patient has had a recent decline in their functional status and demonstrates the ability to make significant improvements in function in a reasonable and predictable amount of time.     Precautions / Restrictions Precautions Precautions: Fall Restrictions Weight Bearing Restrictions Per Provider Order: No      Mobility  Bed Mobility Overal bed mobility: Needs Assistance Bed Mobility: Supine to Sit     Supine to sit: Supervision, Contact guard     General bed mobility comments: labored movement, c/o dizziness upon sitting up    Transfers Overall transfer level: Needs assistance Equipment used: None, Rolling walker (2 wheels) Transfers: Sit to/from Stand, Bed to chair/wheelchair/BSC Sit to Stand: Min assist, Mod assist   Step pivot transfers: Min assist, Mod assist       General transfer comment: unsteady labored movement having to lean on bed rail and armrest of chair for support when taking steps without AD, required use of RW for safety    Ambulation/Gait   Gait Distance (Feet): 8 Feet Assistive device: Rolling walker (2 wheels) Gait Pattern/deviations: Decreased step length - right, Decreased step length - left, Decreased stride length Gait velocity: slow     General Gait Details: limited to a few slow labored steps forward/backwards before having to sit due to fatigue and mild dizziness  Stairs            Wheelchair Mobility     Tilt Bed    Modified Rankin (Stroke Patients Only)       Balance Overall balance assessment: Needs assistance Sitting-balance support: Feet supported, No upper extremity supported  Sitting balance-Leahy Scale: Fair Sitting balance - Comments: fair/good seated at EOB   Standing balance support: During functional activity, No upper extremity supported Standing balance-Leahy Scale: Poor Standing balance comment: fair/poor using RW                             Pertinent Vitals/Pain Pain  Assessment Pain Assessment: No/denies pain    Home Living Family/patient expects to be discharged to:: Private residence Living Arrangements: Alone Available Help at Discharge: Family;Available PRN/intermittently Type of Home: Mobile home Home Access: Ramped entrance       Home Layout: One level Home Equipment: Cane - single point;Shower seat      Prior Function Prior Level of Function : Independent/Modified Independent             Mobility Comments: Community ambulation without AD, drives ADLs Comments: Independent     Extremity/Trunk Assessment   Upper Extremity Assessment Upper Extremity Assessment: Defer to OT evaluation    Lower Extremity Assessment Lower Extremity Assessment: Generalized weakness    Cervical / Trunk Assessment Cervical / Trunk Assessment: Normal  Communication   Communication Communication: No apparent difficulties    Cognition Arousal: Alert Behavior During Therapy: WFL for tasks assessed/performed   PT - Cognitive impairments: No apparent impairments                         Following commands: Intact       Cueing Cueing Techniques: Verbal cues     General Comments      Exercises     Assessment/Plan    PT Assessment Patient needs continued PT services  PT Problem List Decreased strength;Decreased activity tolerance;Decreased balance;Decreased mobility       PT Treatment Interventions DME instruction;Gait training;Stair training;Functional mobility training;Therapeutic activities;Therapeutic exercise;Balance training;Patient/family education    PT Goals (Current goals can be found in the Care Plan section)  Acute Rehab PT Goals Patient Stated Goal: return home PT Goal Formulation: With patient Time For Goal Achievement: 07/07/23 Potential to Achieve Goals: Good    Frequency Min 4X/week     Co-evaluation               AM-PAC PT "6 Clicks" Mobility  Outcome Measure Help needed turning from your  back to your side while in a flat bed without using bedrails?: A Little Help needed moving from lying on your back to sitting on the side of a flat bed without using bedrails?: A Little Help needed moving to and from a bed to a chair (including a wheelchair)?: A Lot Help needed standing up from a chair using your arms (e.g., wheelchair or bedside chair)?: A Lot Help needed to walk in hospital room?: A Lot Help needed climbing 3-5 steps with a railing? : A Lot 6 Click Score: 14    End of Session   Activity Tolerance: Patient tolerated treatment well;Patient limited by fatigue Patient left: in chair;with call bell/phone within reach Nurse Communication: Mobility status PT Visit Diagnosis: Unsteadiness on feet (R26.81);Other abnormalities of gait and mobility (R26.89);Muscle weakness (generalized) (M62.81)    Time: 1610-9604 PT Time Calculation (min) (ACUTE ONLY): 27 min   Charges:   PT Evaluation $PT Eval Moderate Complexity: 1 Mod PT Treatments $Therapeutic Activity: 23-37 mins PT General Charges $$ ACUTE PT VISIT: 1 Visit         1:10 PM, 06/23/23 Walton Guppy, MPT Physical Therapist with [facility]  [phone] office [phone] mobile phone

## 2023-06-23 NOTE — Care Management Obs Status (Signed)
 MEDICARE OBSERVATION STATUS NOTIFICATION   Patient Details  Name: Angela Dawson MRN: [id number] Date of Birth: March 24, 1945   Medicare Observation Status Notification Given:   Yes.    Lynda Sands, RN 06/23/2023, 8:59 AM

## 2023-06-23 NOTE — Consult Note (Signed)
 [facility] TELE[facility]ICINE  CONSULT   Date of service: June 23, 2023 Patient Name: Angela Dawson MRN:  [id number] DOB:  November 06, 1945 Requesting Provider: Amber Bail Location of the provider: [facility] in [place], [place] Location of the patient: [facility] bed#A303 Reason for consult: "embolic strokes in the setting of new onset Afibb"   This consult was provided via telemedicine with 2-way video and audio communication. The patient/family was informed that care would be provided in this way and agreed to receive care in this manner.   History of Present Illness  Angela Dawson is a 78 y.o. female with PMH significant for Allergy, Chicken pox, AVR, Heart murmur, History of bronchitis, History of kidney stones, Hypertension, obesity who presents with numbness on the left side of her face. Thought she had a stroke. She also reports feeling like her vision is kind of moving, feels like she is on a roller coaster. Symptoms started tuesday. She called her son and daughter. She was letting her dog out on Tuesday and she went through the storm door.  No personal prior hx of strokes, no family hx of strokes.  Has a hx of HTN, HLD and is on repatha  but has not started taking injectsion yet despite bring prescribed back in December.  She came in to the hospital. Found to have new onset afibb.  Stroke Measures   Last Known Well: 06/19/23 TNK Given: not offered, outside window and too mild to treat. IR Thrombectomy: not offered, outside window and too mild to treat, no LVO mRS: Modified Rankin Scale: 0-Completely asymptomatic and back to baseline post- stroke Time of teleneurologist evaluation: 1310   Vitals   Vitals:   06/23/23 0105 06/23/23 0441 06/23/23 0852 06/23/23 0955  BP: (!) 164/84 138/79 (!) 141/72 128/64  Pulse: 63 87 66 80  Resp: 20     Temp: 98.6 F (37 C) 99 F (37.2 C)  98.2 F (36.8 C)  TempSrc: Oral Oral  Oral  SpO2: 98% 97%   96%  Weight:      Height:         Body mass index is 39.06 kg/m.   Tele-neuro Exam and NIHSS   General: awake, alert, oriented x 3, able to do simple calculations.  1A: Level of Consciousness - 0 1B: Ask Month and Age - 0 1C: 'Blink Eyes' & 'Squeeze Hands' - 0 2: Test Horizontal Extraocular Movements - 0 3: Test Visual Fields - 0 4: Test Facial Palsy - 0 5A: Test Left Arm Motor Drift - 0 5B: Test Right Arm Motor Drift - 0 6A: Test Left Leg Motor Drift - 0 6B: Test Right Leg Motor Drift - 0 7: Test Limb Ataxia - 0 8: Test Sensation - 0 9: Test Language/Aphasia- 0 10: Test Dysarthria - 0 11: Test Extinction/Inattention - 0 NIHSS score: 0  Other exam findings: no other significant findings.  Imaging and Labs   CBC:  Recent Labs  Lab 06/22/23 1921 06/23/23 0428  WBC 9.1 8.7  NEUTROABS 4.9  --   HGB 15.3* 14.0  HCT 44.6 42.5  MCV 92.5 94.0  PLT 212 200    Basic Metabolic Panel:  Lab Results  Component Value Date   NA 140 06/23/2023   K 3.2 (L) 06/23/2023   CO2 26 06/23/2023   GLUCOSE 98 06/23/2023   BUN 10 06/23/2023   CREATININE 0.90 06/23/2023   CALCIUM  8.9 06/23/2023   GFRNONAA >60 06/23/2023   GFRAA 78 04/27/2020  Lipid Panel:  Lab Results  Component Value Date   LDLCALC 134 (H) 02/05/2023   HgbA1c:  Lab Results  Component Value Date   HGBA1C 5.3 01/14/2016   Urine Drug Screen: No results found for: "LABOPIA", "COCAINSCRNUR", "LABBENZ", "AMPHETMU", "THCU", "LABBARB"  Alcohol Level     Component Value Date/Time   ETH <10 06/22/2023 1921    MRI Brain w/o contrast:   Impression   Angela Dawson is a 78 y.o. female with PMH significant for with PMH significant for Allergy, Chicken pox, AVR, Heart murmur, History of bronchitis, History of kidney stones, Hypertension, obesity who presents with numbness on the left side of her face and vertigo. She was found to be in new onset afibb with MRI brain demonstrating embolic appearing infarcts. Her  limited tele-neurologic examination is notable for no focal deficit.  Recommendations  - Frequent Neuro checks per stroke unit protocol - TTE with normal EF, no PFO - Lipid panel is pending. However, she has listed allergies to statins and is not on prescribed repatha . I counseled her on the importance of taking repatha  to reduce risk of strokes. - HbA1c to evaluate for diabetes and how well it is controlled is still pending. - Agree with eliquis  5mg  BID for secondary stroke prevention. I discussed with her that she not take OTC Ibuprofen. She also has meloxicam  listed on her home meds and I requested that she not take meloxicam . - SBP goal - aim for gradual normotension - Recommend Telemetry monitoring - Recommend bedside swallow screen prior to PO intake. - Stroke education booklet - Recommend PT/OT/SLP consult - follow up with Dr. Ardella Beaver with [facility] in 4-6 weeks. ______________________________________________________________________    This patient is receiving care for possible acute neurological changes. There was 40 minutes of care by this provider at the time of service, including time for direct evaluation via telemedicine, review of medical records, imaging studies and discussion of findings with providers, the patient and/or family.  Hether Anselmo [facility] Pager Number [phone]  If 7pm- 7am, please page neurology on call as listed in [facility].

## 2023-06-23 NOTE — Progress Notes (Signed)
 Patient to radiology.

## 2023-06-23 NOTE — Progress Notes (Signed)
  Echocardiogram 2D Echocardiogram has been performed.  Angela Dawson 06/23/2023, 10:27 AM

## 2023-06-23 NOTE — Plan of Care (Signed)
  Problem: Acute Rehab PT Goals(only PT should resolve) Goal: Pt Will Go Supine/Side To Sit Outcome: Progressing Flowsheets (Taken 06/23/2023 1312) Pt will go Supine/Side to Sit:  with modified independence  with supervision Goal: Patient Will Transfer Sit To/From Stand Outcome: Progressing Flowsheets (Taken 06/23/2023 1312) Patient will transfer sit to/from stand:  with contact guard assist  with minimal assist Goal: Pt Will Transfer Bed To Chair/Chair To Bed Outcome: Progressing Flowsheets (Taken 06/23/2023 1312) Pt will Transfer Bed to Chair/Chair to Bed:  with contact guard assist  with min assist Goal: Pt Will Ambulate Outcome: Progressing Flowsheets (Taken 06/23/2023 1312) Pt will Ambulate:  25 feet  with minimal assist  with rolling walker   1:13 PM, 06/23/23 Walton Guppy, MPT Physical Therapist with [facility] [phone] office [phone] mobile phone

## 2023-06-23 NOTE — Hospital Course (Addendum)
 TANICIA WOLAVER is a 78 y.o. female with medical history significant of hypertension, obesity, CVA who presents to the emergency department due to complaint of 5-day onset of perioral tingling, imbalance on ambulation which started after sustaining a fall at home.  Patient stated that she stumbled over her dog on Monday (4/14) and fell face down on her cemented porch, she states that she hit her head on the door, both sustaining no loss of consciousness.  She has been having intermittent headaches and tingling to left side of mouth, imbalance with need to hold onto things on ambulation and lightheadedness since onset of symptoms.  She denies chest pain, shortness of breath, nausea, vomiting.   ED Course: BP was 148/97, other vital signs are within normal range.  Workup in the ED showed normal CBC except hemoglobin of 15.3, BMP was normal except for blood glucose of 108, magnesium  1.8, alcohol level was undetectable.  Influenza A, B, SARS, Nancyi, RSV was negative. CT angiography of head and neck with and without contrast showed no emergent LVO or proximal hemodynamically significant stenosis. CT of head showed no evidence of acute infarction, hemorrhage, hydrocephalus, extra-axial collection or mass lesion/mass effect. Patient was noted to have new onset atrial fibrillation with rate control.  She was started on Eliquis .  Hospitalist was asked to admit patient for further evaluation and management.    Assessment & Plan:   Principal Problem:   New onset atrial fibrillation (HCC) Active Problems:   Falls   Essential hypertension   Hyperlipidemia   S/P AVR (aortic valve replacement)   Prolonged QT interval   History of recent fall   Obesity, Class II, BMI 35-39.9  Acute CVA Left perioral paresthesia CVA -then on MRI with embolic event in the anterior and posterior circulation  - CVA, continue with neurochecks, no new focal neurological findings -Pending MRI of the brain- Positive for a Recent  Embolic Event, with scattered small subacute infarcts in the anterior and posterior circulation. Notable involvement of the left lateral medulla, the central pons. No associated hemorrhage or mass effect. -Echo: - Pertinent labs reviewed, within normal limits, suctioning mildly elevated TSH at 5.02, will obtain free T3 and T4, and B12 level  -CT head reviewed, no evidence of acute infarction or hemorrhage CT angiography of head and neck showed no emergent LVO  -Obtaining PT/OT/speech - Will consult neurology for further evaluation - Optimizing medical management, patient already started on Eliquis ,  with LDL goal of less than 70  We will check her lipid panel-but patient has severe allergy to statins.  Therefore not initiating high-dose statins.  New onset Afib  With controlled, continue home medication of Toprol  Chads Vascor greater than 2, therefore starting Eliquis   Prolonged QT interval QTc 561 ms Avoid QT prolonging drugs Magnesium1.8, this will be optimized to reach 2.0 K+ 3.5, this will be optimized to reach 4.0 Repeat EKG in the morning   Essential hypertension Continue home lisinopril , metoprolol    Recent fall -Falls at home -no significant trauma Continue fall precaution Continue PT/OT eval and treat   Vitamin B12 deficiency  - Initiating IV then p.o. vitamin B12 supplements  hypokalemia/hypomagnesemia  - Replace, monitor   obesity class II (BMI 39.06) Patient was counseled about the cardiovascular and metabolic risk of obesity. Patient was counseled for diet control, exercise regimen and weight loss.

## 2023-06-23 NOTE — ED Provider Notes (Incomplete)
 [place] EMERGENCY DEPARTMENT AT [facility] Provider Note  CSN: [id number] Arrival date & time: 06/22/23 4098  Chief Complaint(s) Numbness  HPI Angela Dawson is a 78 y.o. female with past medical history as below, significant for AVR, HLD, obesity, hypertension who presents to the ED with complaint of perioral tingling, fatigue, head injury  Reports on Monday she stumbled over her dog, hit her head on the door.  No LOC.  Has been having intermittent headaches since then, intermittent tingling to the left side of her mouth.  Feeling generalized fatigue, some lightheadedness and balance disturbance.  No chest pain or dyspnea, no palpitations.  No nausea or vomiting.  No vision or hearing changes.  Past Medical History Past Medical History:  Diagnosis Date  . Allergy    seasonal  . Chicken pox   . Complication of anesthesia    difficult to wake up after tubal ligations  . Dyspnea    with exertion  . Heart murmur   . History of bronchitis   . History of kidney stones    "only one"  . Hypertension   . Measles    Patient Active Problem List   Diagnosis Date Noted  . Statin intolerance 10/03/2019  . S/P AVR (aortic valve replacement) 01/17/2016  . Ascending aorta dilatation (HCC) 11/20/2013  . Hyperlipidemia 09/30/2013  . Severe obesity (BMI >= 40) (HCC) 09/30/2013  . Hypertension 07/17/2012   Home Medication(s) Prior to Admission medications   Medication Sig Start Date End Date Taking? Authorizing Provider  acetaminophen  (TYLENOL ) 325 MG tablet Take 2 tablets (650 mg total) by mouth every 6 (six) hours as needed for mild pain. 01/22/16   Barrett, Erin R, PA-C  allopurinol  (ZYLOPRIM ) 300 MG tablet Take 1 tablet (300 mg total) by mouth daily. 03/05/23   Romayne Clubs, PA-C  Ascorbic Acid (VITAMIN C PO) Take by mouth.    [provider]  aspirin  EC 81 MG tablet Take 1 tablet (81 mg total) by mouth daily. 08/20/20   Gaylyn Keas Mary-Margaret, FNP  Calcium   Carb-Cholecalciferol  600-800 MG-UNIT TABS Take 1 tablet by mouth daily.    [provider]  cetirizine  (ZYRTEC ) 10 MG tablet TAKE 1 TABLET EVERY DAY 08/25/21   Gaylyn Keas, Mary-Margaret, FNP  Cholecalciferol  (VITAMIN D ) 2000 units CAPS Take 2,000 Units by mouth daily.    [provider]  colchicine  0.6 MG tablet Take 1 tablet (0.6 mg total) by mouth daily. 03/05/23   Romayne Clubs, PA-C  Evolocumab  (REPATHA ) 140 MG/ML SOSY Inject 140 mg into the skin every 14 (fourteen) days. Patient not taking: Reported on 03/05/2023 02/16/23   Lasalle Pointer, NP  lisinopril  (ZESTRIL ) 20 MG tablet Take 1 tablet (20 mg total) by mouth daily. 02/05/23   Gaylyn Keas, Mary-Margaret, FNP  meloxicam  (MOBIC ) 15 MG tablet TAKE 1/2 TABLET EVERY DAY Patient taking differently: Take 7.5 mg by mouth daily as needed for pain. 11/10/21   Gaylyn Keas Mary-Margaret, FNP  metoprolol  tartrate (LOPRESSOR ) 25 MG tablet Take 1 tablet (25 mg total) by mouth 2 (two) times daily. 02/05/23   Gaylyn Keas, Mary-Margaret, FNP  Red Yeast Rice 600 MG TABS Take 1 tablet by mouth daily.    [provider]  Past Surgical History Past Surgical History:  Procedure Laterality Date  . AORTIC VALVE REPLACEMENT N/A 01/17/2016   Procedure: AORTIC VALVE REPLACEMENT (AVR) USING EDWARDS MAGNA EASE PERICARDIAL BIOPROSTHESIS VALVE;  Surgeon: Norita Beauvais, MD;  Location: [facility] OR;  Service: Open Heart Surgery;  Laterality: N/A;  . CARDIAC CATHETERIZATION N/A 09/24/2015   Procedure: Right/Left Heart Cath and Coronary Angiography;  Surgeon: Lucendia Rusk, MD;  Location: [facility] INVASIVE CV LAB;  Service: Cardiovascular;  Laterality: N/A;  . COLONOSCOPY    . REPLACEMENT ASCENDING AORTA N/A 01/17/2016   Procedure: REPLACEMENT ASCENDING AORTA USING HEMASHIELD PLATINUM WOVEN DOUBLE VELOUR VASCULAR GRAFT;  Surgeon:  Norita Beauvais, MD;  Location: [facility] OR;  Service: Open Heart Surgery;  Laterality: N/A;  . TEE WITHOUT CARDIOVERSION N/A 01/17/2016   Procedure: TRANSESOPHAGEAL ECHOCARDIOGRAM (TEE);  Surgeon: Norita Beauvais, MD;  Location: [facility] OR;  Service: Open Heart Surgery;  Laterality: N/A;  . TUBAL LIGATION     Family History Family History  Problem Relation Age of Onset  . Alzheimer's disease Mother   . Heart disease Mother   . Heart attack Father 35  . Post-traumatic stress disorder Brother   . Breast cancer Maternal Aunt   . Heart attack Son   . Other Son        5 back surgeries    Social History Social History   Tobacco Use  . Smoking status: Former    Current packs/day: 0.00    Average packs/day: 0.5 packs/day for 5.2 years (2.6 ttl pk-yrs)    Types: Cigarettes    Start date: 08/04/1968    Quit date: 10/10/1973    Years since quitting: 49.7    Passive exposure: Current  . Smokeless tobacco: Never  . Tobacco comments:    smoked less than 1 pack every 2 weeks. Havent smoked in atleast 40 years  Vaping Use  . Vaping status: Never Used  Substance Use Topics  . Alcohol use: Not Currently    Comment: seldom  . Drug use: No   Allergies Crestor  [rosuvastatin ], Lipitor [atorvastatin ], Meloxicam , Statins, and Penicillins  Review of Systems A thorough review of systems was obtained and all systems are negative except as noted in the HPI and PMH.   Physical Exam Vital Signs  I have reviewed the triage vital signs BP 133/60   Pulse 75   Temp 98.2 F (36.8 C) (Oral)   Resp 18   Ht 5' (1.524 m)   Wt 90.7 kg   SpO2 97%   BMI 39.06 kg/m  Physical Exam Vitals and nursing note reviewed.  Constitutional:      General: She is not in acute distress.    Appearance: Normal appearance.  HENT:     Head: Normocephalic and atraumatic.     Right Ear: External ear normal.     Left Ear: External ear normal.     Nose: Nose normal.     Mouth/Throat:     Mouth: Mucous membranes are  moist.  Eyes:     General: No scleral icterus.       Right eye: No discharge.        Left eye: No discharge.     Extraocular Movements: Extraocular movements intact.     Pupils: Pupils are equal, round, and reactive to light.  Cardiovascular:     Rate and Rhythm: Normal rate. Rhythm irregular.     Pulses: Normal pulses.     Heart sounds: Normal heart sounds.  Pulmonary:  Effort: Pulmonary effort is normal. No respiratory distress.     Breath sounds: Normal breath sounds. No stridor.  Abdominal:     General: Abdomen is flat. There is no distension.     Palpations: Abdomen is soft.     Tenderness: There is no abdominal tenderness.  Musculoskeletal:     Cervical back: No rigidity.     Right lower leg: No edema.     Left lower leg: No edema.  Skin:    General: Skin is warm and dry.     Capillary Refill: Capillary refill takes less than 2 seconds.  Neurological:     Mental Status: She is alert and oriented to person, place, and time.     GCS: GCS eye subscore is 4. GCS verbal subscore is 5. GCS motor subscore is 6.     Cranial Nerves: Cranial nerves 2-12 are intact. No dysarthria or facial asymmetry.     Sensory: Sensation is intact.     Motor: Motor function is intact. No tremor.     Coordination: Coordination is intact. Finger-Nose-Finger Test normal.     Comments: Gait testing deferred secondary to patient safety.  Paresthesias left peri-oral   Psychiatric:        Mood and Affect: Mood normal.        Behavior: Behavior normal. Behavior is cooperative.     ED Results and Treatments Labs (all labs ordered are listed, but only abnormal results are displayed) Labs Reviewed  CBC WITH DIFFERENTIAL/PLATELET - Abnormal; Notable for the following components:      Result Value   Hemoglobin 15.3 (*)    All other components within normal limits  BASIC METABOLIC PANEL WITH GFR - Abnormal; Notable for the following components:   Glucose, Bld 108 (*)    All other components  within normal limits  RESP PANEL BY RT-PCR (RSV, FLU A&B, COVID)  RVPGX2  ETHANOL  MAGNESIUM                                                                                                                           Radiology CT ANGIO HEAD NECK W WO CM Result Date: 06/22/2023 CLINICAL DATA:  Neuro deficit, acute, stroke suspected left sided facial numbness EXAM: CT ANGIOGRAPHY HEAD AND NECK WITH AND WITHOUT CONTRAST TECHNIQUE: Multidetector CT imaging of the head and neck was performed using the standard protocol during bolus administration of intravenous contrast. Multiplanar CT image reconstructions and MIPs were obtained to evaluate the vascular anatomy. Carotid stenosis measurements (when applicable) are obtained utilizing NASCET criteria, using the distal internal carotid diameter as the denominator. RADIATION DOSE REDUCTION: This exam was performed according to the departmental dose-optimization program which includes automated exposure control, adjustment of the mA and/or kV according to patient size and/or use of iterative reconstruction technique. CONTRAST:  75mL OMNIPAQUE  IOHEXOL  350 MG/ML SOLN COMPARISON:  None Available. FINDINGS: CT HEAD FINDINGS Brain: No evidence of acute infarction, hemorrhage, hydrocephalus, extra-axial collection or mass lesion/mass effect.  Remote right basal ganglia perforator infarct. Remote left cerebellar infarct. Patchy white matter hypodensities, compatible with chronic microvascular disease. Vascular: See below. Skull: No acute fracture. Sinuses/Orbits: No acute finding. Other: Clear sinuses.  No acute orbital findings. Review of the MIP images confirms the above findings CTA NECK FINDINGS Aortic arch: Great vessel origins are pain without significant stenosis. Aortic atherosclerosis. Right carotid system: No evidence of dissection, stenosis (50% or greater), or occlusion. Left carotid system: No evidence of dissection, stenosis (50% or greater), or occlusion.  Vertebral arteries: Right dominant. No evidence of dissection, stenosis (50% or greater), or occlusion. Skeleton: No acute fracture on limited assessment. Other neck: No acute abnormality on limited assessment. Upper chest: Visualized lung apices are clear. Review of the MIP images confirms the above findings CTA HEAD FINDINGS Anterior circulation: Bilateral intracranial ICAs, MCAs, and ACAs are patent without proximal hemodynamically significant stenosis. Posterior circulation: Bilateral intradural vertebral arteries, basilar artery and bilateral posterior cerebral arteries are patent without proximal hemodynamically significant stenosis. Venous sinuses: As permitted by contrast timing, patent. Review of the MIP images confirms the above findings IMPRESSION: 1. No emergent large vessel occlusion or proximal hemodynamically significant stenosis. 2. 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 3. Aortic Atherosclerosis (ICD10-I70.0). Electronically Signed   By: Stevenson Elbe [name]   On: 06/22/2023 22:27    Pertinent labs & imaging results that were available during my care of the patient were reviewed by me and considered in my medical decision making (see MDM for details).  Medications Ordered in ED Medications  sodium chloride  0.9 % bolus 1,000 mL (1,000 mLs Intravenous New Bag/Given 06/22/23 2125)  iohexol  (OMNIPAQUE ) 350 MG/ML injection 75 mL (75 mLs Intravenous Contrast Given 06/22/23 2109)                                                                                                                                     Procedures Procedures  (including critical care time)  Medical Decision Making / ED Course    Medical Decision Making:    Angela Dawson is a 77 y.o. female with past medical history as below, significant for AVR, HLD, obesity, hypertension who presents to the ED with complaint of perioral tingling, fatigue, head injury. The complaint involves an extensive  differential diagnosis and also carries with it a high risk of complications and morbidity.  Serious etiology was considered. Ddx includes but is not limited to: Differential diagnoses for head trauma includes subdural hematoma, epidural hematoma, acute concussion, traumatic subarachnoid hemorrhage, cerebral contusions, etc.   Complete initial physical exam performed, notably the patient was in ***.    Reviewed and confirmed nursing documentation for past medical history, family history, social history.  Vital signs reviewed.    ***  Clinical Course as of 06/23/23 0000  Fri Jun 22, 2023  2100 Afib noted on telemetry and EKG, no afib on prior EKG, no documented hx afib  [  SG]  2258 CHADSVASC is 5 [SG]  2258 She remains in afib, rate controlled. Appears to be new onset afib [SG]    Clinical Course User Index [SG] Teddi Favors, DO    Brief summary:                 Additional history obtained: -Additional history obtained from {wsadditionalhistorian:28072} -External records from outside source obtained and reviewed including: Chart review including previous notes, labs, imaging, consultation notes including  ***   Lab Tests: -I ordered, reviewed, and interpreted labs.   The pertinent results include:   Labs Reviewed  CBC WITH DIFFERENTIAL/PLATELET - Abnormal; Notable for the following components:      Result Value   Hemoglobin 15.3 (*)    All other components within normal limits  BASIC METABOLIC PANEL WITH GFR - Abnormal; Notable for the following components:   Glucose, Bld 108 (*)    All other components within normal limits  RESP PANEL BY RT-PCR (RSV, FLU A&B, COVID)  RVPGX2  ETHANOL  MAGNESIUM     Notable for ***  EKG   EKG Interpretation Date/Time:  Friday June 22 2023 22:38:25 EDT Ventricular Rate:  88 PR Interval:  217 QRS Duration:  83 QT Interval:  463 QTC Calculation: 561 R Axis:   52  Text Interpretation: Borderline prolonged PR interval  Borderline T abnormalities, anterior leads Prolonged QT interval Atrial fibrillation Confirmed by Russella Courts ([id number]) on 06/22/2023 10:56:50 PM         Imaging Studies ordered: I ordered imaging studies including *** I independently visualized the following imaging with scope of interpretation limited to determining acute life threatening conditions related to emergency care; findings noted above I independently visualized and interpreted imaging. I agree with the radiologist interpretation   Medicines ordered and prescription drug management: Meds ordered this encounter  Medications  . DISCONTD: iohexol  (OMNIPAQUE ) 300 MG/ML solution 75 mL  . sodium chloride  0.9 % bolus 1,000 mL  . iohexol  (OMNIPAQUE ) 350 MG/ML injection 75 mL    -I have reviewed the patients home medicines and have made adjustments as needed   Consultations Obtained: I requested consultation with the ***,  and discussed lab and imaging findings as well as pertinent plan - they recommend: ***   Cardiac Monitoring: The patient was maintained on a cardiac monitor.  I personally viewed and interpreted the cardiac monitored which showed an underlying rhythm of: *** Continuous pulse oximetry interpreted by myself, ***% on ***.    Social Determinants of Health:  Diagnosis or treatment significantly limited by social determinants of health: {wssoc:28071}   Reevaluation: After the interventions noted above, I reevaluated the patient and found that they have {resolved/improved/worsened:23923::"improved"}  Co morbidities that complicate the patient evaluation . Past Medical History:  Diagnosis Date  . Allergy    seasonal  . Chicken pox   . Complication of anesthesia    difficult to wake up after tubal ligations  . Dyspnea    with exertion  . Heart murmur   . History of bronchitis   . History of kidney stones    "only one"  . Hypertension   . Measles       Dispostion: Disposition decision including  need for hospitalization was considered, and patient {wsdispo:28070::"discharged from emergency department."}    Final Clinical Impression(s) / ED Diagnoses Final diagnoses:  New onset atrial fibrillation (HCC)  Injury of head, initial encounter

## 2023-06-24 DIAGNOSIS — Z79899 Other long term (current) drug therapy: Secondary | ICD-10-CM | POA: Diagnosis not present

## 2023-06-24 DIAGNOSIS — I6312 Cerebral infarction due to embolism of basilar artery: Secondary | ICD-10-CM

## 2023-06-24 DIAGNOSIS — S0990XA Unspecified injury of head, initial encounter: Secondary | ICD-10-CM | POA: Diagnosis not present

## 2023-06-24 DIAGNOSIS — Z8249 Family history of ischemic heart disease and other diseases of the circulatory system: Secondary | ICD-10-CM | POA: Diagnosis not present

## 2023-06-24 DIAGNOSIS — E538 Deficiency of other specified B group vitamins: Secondary | ICD-10-CM | POA: Diagnosis not present

## 2023-06-24 DIAGNOSIS — Z6839 Body mass index (BMI) 39.0-39.9, adult: Secondary | ICD-10-CM | POA: Diagnosis not present

## 2023-06-24 DIAGNOSIS — Z87442 Personal history of urinary calculi: Secondary | ICD-10-CM | POA: Diagnosis not present

## 2023-06-24 DIAGNOSIS — E876 Hypokalemia: Secondary | ICD-10-CM | POA: Diagnosis not present

## 2023-06-24 DIAGNOSIS — R946 Abnormal results of thyroid function studies: Secondary | ICD-10-CM | POA: Diagnosis present

## 2023-06-24 DIAGNOSIS — I480 Paroxysmal atrial fibrillation: Secondary | ICD-10-CM | POA: Diagnosis not present

## 2023-06-24 DIAGNOSIS — Z9181 History of falling: Secondary | ICD-10-CM | POA: Diagnosis not present

## 2023-06-24 DIAGNOSIS — Z1152 Encounter for screening for COVID-19: Secondary | ICD-10-CM | POA: Diagnosis not present

## 2023-06-24 DIAGNOSIS — R262 Difficulty in walking, not elsewhere classified: Secondary | ICD-10-CM | POA: Diagnosis not present

## 2023-06-24 DIAGNOSIS — R488 Other symbolic dysfunctions: Secondary | ICD-10-CM | POA: Diagnosis not present

## 2023-06-24 DIAGNOSIS — D519 Vitamin B12 deficiency anemia, unspecified: Secondary | ICD-10-CM | POA: Diagnosis not present

## 2023-06-24 DIAGNOSIS — Z803 Family history of malignant neoplasm of breast: Secondary | ICD-10-CM | POA: Diagnosis not present

## 2023-06-24 DIAGNOSIS — Z87891 Personal history of nicotine dependence: Secondary | ICD-10-CM | POA: Diagnosis not present

## 2023-06-24 DIAGNOSIS — Y92009 Unspecified place in unspecified non-institutional (private) residence as the place of occurrence of the external cause: Secondary | ICD-10-CM | POA: Diagnosis not present

## 2023-06-24 DIAGNOSIS — Z820 Family history of epilepsy and other diseases of the nervous system: Secondary | ICD-10-CM | POA: Diagnosis not present

## 2023-06-24 DIAGNOSIS — I700 Atherosclerosis of aorta: Secondary | ICD-10-CM | POA: Diagnosis not present

## 2023-06-24 DIAGNOSIS — E785 Hyperlipidemia, unspecified: Secondary | ICD-10-CM | POA: Diagnosis not present

## 2023-06-24 DIAGNOSIS — Z7901 Long term (current) use of anticoagulants: Secondary | ICD-10-CM | POA: Diagnosis not present

## 2023-06-24 DIAGNOSIS — W010XXA Fall on same level from slipping, tripping and stumbling without subsequent striking against object, initial encounter: Secondary | ICD-10-CM | POA: Diagnosis not present

## 2023-06-24 DIAGNOSIS — Z888 Allergy status to other drugs, medicaments and biological substances status: Secondary | ICD-10-CM | POA: Diagnosis not present

## 2023-06-24 DIAGNOSIS — Z880 Allergy status to penicillin: Secondary | ICD-10-CM | POA: Diagnosis not present

## 2023-06-24 DIAGNOSIS — I10 Essential (primary) hypertension: Secondary | ICD-10-CM | POA: Diagnosis not present

## 2023-06-24 DIAGNOSIS — R0602 Shortness of breath: Secondary | ICD-10-CM | POA: Diagnosis not present

## 2023-06-24 DIAGNOSIS — R29700 NIHSS score 0: Secondary | ICD-10-CM | POA: Diagnosis present

## 2023-06-24 DIAGNOSIS — E66812 Obesity, class 2: Secondary | ICD-10-CM | POA: Diagnosis not present

## 2023-06-24 DIAGNOSIS — I4891 Unspecified atrial fibrillation: Secondary | ICD-10-CM | POA: Diagnosis not present

## 2023-06-24 DIAGNOSIS — R2681 Unsteadiness on feet: Secondary | ICD-10-CM | POA: Diagnosis not present

## 2023-06-24 DIAGNOSIS — Z952 Presence of prosthetic heart valve: Secondary | ICD-10-CM | POA: Diagnosis not present

## 2023-06-24 DIAGNOSIS — Z7982 Long term (current) use of aspirin: Secondary | ICD-10-CM | POA: Diagnosis not present

## 2023-06-24 DIAGNOSIS — M6281 Muscle weakness (generalized): Secondary | ICD-10-CM | POA: Diagnosis not present

## 2023-06-24 LAB — T3, FREE: T3, Free: 2.6 pg/mL (ref 2.0–4.4)

## 2023-06-24 NOTE — Progress Notes (Signed)
 Physical Therapy Treatment Patient Details Name: Angela Dawson MRN: [id number] DOB: 25-May-1945 Today's Date: 06/24/2023   History of Present Illness Angela Dawson is a 78 y.o. female with medical history significant of hypertension, obesity, CVA who presents to the emergency department due to complaint of 5-day onset of perioral tingling, imbalance on ambulation which started after sustaining a fall at home.  Patient stated that she stumbled over her dog on Monday (4/14) and fell face down on her cemented porch, she states that she hit her head on the door, both sustaining no loss of consciousness.  She has been having intermittent headaches and tingling to left side of mouth, imbalance with need to hold onto things on ambulation and lightheadedness since onset of symptoms.  She denies chest pain, shortness of breath, nausea, vomiting.    PT Comments  Patient present seated in chair (assisted by nursing staff) and agreeable for therapy. Patient demonstrates fair/good return for completing BLE ROM/strengthening exercises with verbal cueing, very unsteady on feet with poor standing balance during transfer to bed having to hold onto bed rail and hand held assist, slow labored movement ambulating in room/hallway, had to be followed with w/c for safety and required sitting rest break before ambulating back to room.  Patient tolerated staying up in chair after therapy. Patient will benefit from continued skilled physical therapy in hospital and recommended venue below to increase strength, balance, endurance for safe ADLs and gait.      If plan is discharge home, recommend the following: A lot of help with walking and/or transfers;Help with stairs or ramp for entrance;Assistance with cooking/housework;A lot of help with bathing/dressing/bathroom   Can travel by private vehicle        Equipment Recommendations  Rolling walker (2 wheels)    Recommendations for Other Services       Precautions /  Restrictions Precautions Precautions: Fall Restrictions Weight Bearing Restrictions Per Provider Order: No     Mobility  Bed Mobility               General bed mobility comments: Patient presents seated in chair (assisted by nursing staff)    Transfers Overall transfer level: Needs assistance Equipment used: None, Rolling walker (2 wheels) Transfers: Sit to/from Stand, Bed to chair/wheelchair/BSC Sit to Stand: Min assist   Step pivot transfers: Min assist, Mod assist       General transfer comment: continues to lean on nearby objects for support requiring Mod hand held assist, safer using RW    Ambulation/Gait Ambulation/Gait assistance: Min assist, Mod assist Gait Distance (Feet): 35 Feet Assistive device: Rolling walker (2 wheels) Gait Pattern/deviations: Decreased step length - right, Decreased step length - left, Decreased stride length, Drifts right/left Gait velocity: decreased     General Gait Details: slow labored movement with c/o veering to the right with mild dfficulty holding onto RW requiring verbal/tactile cueing for safety, followed with w/c for safety and required sitting rest break before returning to room   Stairs             Wheelchair Mobility     Tilt Bed    Modified Rankin (Stroke Patients Only)       Balance Overall balance assessment: Needs assistance Sitting-balance support: Feet supported, No upper extremity supported Sitting balance-Leahy Scale: Fair Sitting balance - Comments: fair/good seated at EOB   Standing balance support: During functional activity, No upper extremity supported Standing balance-Leahy Scale: Poor Standing balance comment: fair/poor using RW  Communication Communication Communication: No apparent difficulties  Cognition Arousal: Alert Behavior During Therapy: WFL for tasks assessed/performed   PT - Cognitive impairments: No apparent impairments                          Following commands: Intact      Cueing Cueing Techniques: Verbal cues  Exercises General Exercises - Lower Extremity Long Arc Quad: Seated, AROM, Strengthening, Both, 10 reps Hip Flexion/Marching: Seated, AROM, Strengthening, Both, 10 reps Toe Raises: Seated, AROM, Strengthening, Both, 10 reps Heel Raises: Seated, AROM, Strengthening, Both, 10 reps    General Comments        Pertinent Vitals/Pain Pain Assessment Pain Assessment: No/denies pain    Home Living                          Prior Function            PT Goals (current goals can now be found in the care plan section) Acute Rehab PT Goals Patient Stated Goal: return home PT Goal Formulation: With patient Time For Goal Achievement: 07/07/23 Potential to Achieve Goals: Good Progress towards PT goals: Progressing toward goals    Frequency    Min 4X/week      PT Plan      Co-evaluation              AM-PAC PT "6 Clicks" Mobility   Outcome Measure  Help needed turning from your back to your side while in a flat bed without using bedrails?: A Little Help needed moving from lying on your back to sitting on the side of a flat bed without using bedrails?: A Little Help needed moving to and from a bed to a chair (including a wheelchair)?: A Lot Help needed standing up from a chair using your arms (e.g., wheelchair or bedside chair)?: A Little Help needed to walk in hospital room?: A Lot Help needed climbing 3-5 steps with a railing? : A Lot 6 Click Score: 15    End of Session Equipment Utilized During Treatment: Gait belt Activity Tolerance: Patient tolerated treatment well;Patient limited by fatigue Patient left: in chair;with call bell/phone within reach Nurse Communication: Mobility status PT Visit Diagnosis: Unsteadiness on feet (R26.81);Other abnormalities of gait and mobility (R26.89);Muscle weakness (generalized) (M62.81)     Time: 1610-9604 PT Time  Calculation (min) (ACUTE ONLY): 33 min  Charges:    $Gait Training: 8-22 mins $Therapeutic Exercise: 8-22 mins PT General Charges $$ ACUTE PT VISIT: 1 Visit                     11:41 AM, 06/24/23 Walton Guppy, MPT Physical Therapist with [facility] [phone] office [phone] mobile phone

## 2023-06-24 NOTE — Progress Notes (Addendum)
 PROGRESS NOTE    TRYNITY SKOUSEN  BJY:[id number] DOB: [birth year] DOA: 06/22/2023 PCP: Delfina Feller, FNP   Brief Narrative:    ALIZZA SACRA is a 78 y.o. female with medical history significant of hypertension, obesity, CVA who presents to the emergency department due to complaint of 5-day onset of perioral tingling, imbalance on ambulation which started after sustaining a fall at home.  She was found to have acute ischemic CVA and underwent neurology evaluation 4/19 with recommendations to continue Eliquis .  PT recommending SNF placement.  Assessment & Plan:   Principal Problem:   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) Active Problems:   Falls   Essential hypertension   Hyperlipidemia   S/P AVR (aortic valve replacement)   New onset atrial fibrillation (HCC)   Prolonged QT interval   History of recent fall   Obesity, Class II, BMI 35-39.9   Hypokalemia   Hypomagnesemia   B12 deficiency  Assessment and Plan:   Acute CVA Left perioral paresthesia CVA -then on MRI with embolic event in the anterior and posterior circulation   - CVA, continue with neurochecks, no new focal neurological findings -Pending MRI of the brain- Positive for a Recent Embolic Event, with scattered small subacute infarcts in the anterior and posterior circulation. Notable involvement of the left lateral medulla, the central pons. No associated hemorrhage or mass effect. -Echo: - Pertinent labs reviewed, within normal limits, suctioning mildly elevated TSH at 5.02, will obtain free T3 and T4, and B12 level   -CT head reviewed, no evidence of acute infarction or hemorrhage CT angiography of head and neck showed no emergent LVO   -Obtaining PT/OT/speech - Will consult neurology for further evaluation - Optimizing medical management, patient already started on Eliquis ,  with LDL goal of less than 70   We will check her lipid panel-but patient has severe allergy to statins.   Therefore not initiating high-dose statins. LDL 106 -A1c 5.3%   New onset Afib  With controlled, continue home medication of Toprol  Chads Vascor greater than 2, therefore starting Eliquis    Essential hypertension Continue home lisinopril , metoprolol    Recent fall -Falls at home -no significant trauma Continue fall precaution PT/OT recommending SNF with placement pending   Vitamin B12 deficiency  - Initiating IV then p.o. vitamin B12 supplements  Obesity, class II -BMI 39.06   DVT prophylaxis: Apixaban  Code Status: Full Family Communication: None at bedside Disposition Plan: Awaiting SNF placement Status is: Observation The patient will require care spanning > 2 midnights and should be moved to inpatient because: Need for inpatient rehab placement.   Consultants:  Neurology  Procedures:  None  Antimicrobials:  None   Subjective: Patient seen and evaluated today with no new acute complaints or concerns. No acute concerns or events noted overnight.  Objective: Vitals:   06/23/23 0955 06/23/23 1338 06/23/23 2030 06/24/23 0545  BP: 128/64 128/65 (!) 156/63 125/69  Pulse: 80 68 63 100  Resp:   18 18  Temp: 98.2 F (36.8 C) (!) 97.5 F (36.4 C) 98.3 F (36.8 C) 98.4 F (36.9 C)  TempSrc: Oral Oral Oral   SpO2: 96% 96% 97% 96%  Weight:      Height:        Intake/Output Summary (Last 24 hours) at 06/24/2023 1010 Last data filed at 06/24/2023 0835 Gross per 24 hour  Intake 1595.42 ml  Output --  Net 1595.42 ml   Filed Weights   06/22/23 1857  Weight: 90.7 kg  Examination:  General exam: Appears calm and comfortable  Respiratory system: Clear to auscultation. Respiratory effort normal. Cardiovascular system: S1 & S2 heard, RRR.  Gastrointestinal system: Abdomen is soft Central nervous system: Alert and awake Extremities: No edema Skin: No significant lesions noted Psychiatry: Flat affect.    Data Reviewed: I have personally reviewed  following labs and imaging studies  CBC: Recent Labs  Lab 06/22/23 1921 06/23/23 0428  WBC 9.1 8.7  NEUTROABS 4.9  --   HGB 15.3* 14.0  HCT 44.6 42.5  MCV 92.5 94.0  PLT 212 200   Basic Metabolic Panel: Recent Labs  Lab 06/22/23 1921 06/23/23 0428  NA 141 140  K 3.5 3.2*  CL 101 104  CO2 27 26  GLUCOSE 108* 98  BUN 10 10  CREATININE 0.91 0.90  CALCIUM  9.3 8.9  MG 1.8 1.8  PHOS  --  3.7   GFR: Estimated Creatinine Clearance: 53.4 mL/min (by C-G formula based on SCr of 0.9 mg/dL). Liver Function Tests: Recent Labs  Lab 06/23/23 0428  AST 16  ALT 14  ALKPHOS 64  BILITOT 0.4  PROT 5.9*  ALBUMIN  3.0*   No results for input(s): "LIPASE", "AMYLASE" in the last 168 hours. No results for input(s): "AMMONIA" in the last 168 hours. Coagulation Profile: No results for input(s): "INR", "PROTIME" in the last 168 hours. Cardiac Enzymes: No results for input(s): "CKTOTAL", "CKMB", "CKMBINDEX", "TROPONINI" in the last 168 hours. BNP (last 3 results) No results for input(s): "PROBNP" in the last 8760 hours. HbA1C: Recent Labs    06/23/23 1306  HGBA1C 5.3   CBG: No results for input(s): "GLUCAP" in the last 168 hours. Lipid Profile: Recent Labs    06/23/23 1306  CHOL 169  HDL 40*  LDLCALC 106*  TRIG 114  CHOLHDL 4.2   Thyroid  Function Tests: Recent Labs    06/23/23 0429 06/23/23 1306  TSH 5.027*  --   FREET4  --  0.86  T3FREE  --  2.6   Anemia Panel: Recent Labs    06/23/23 0428 06/23/23 0429  VITAMINB12 108*  --   FOLATE  --  7.8   Sepsis Labs: No results for input(s): "PROCALCITON", "LATICACIDVEN" in the last 168 hours.  Recent Results (from the past 240 hours)  Resp panel by RT-PCR (RSV, Flu A&B, Covid) Anterior Nasal Swab     Status: None   Collection Time: 06/22/23  7:56 PM   Specimen: Anterior Nasal Swab  Result Value Ref Range Status   SARS Coronavirus 2 by RT PCR NEGATIVE NEGATIVE Final    Comment: (NOTE) SARS-CoV-2 target nucleic  acids are NOT DETECTED.  The SARS-CoV-2 RNA is generally detectable in upper respiratory specimens during the acute phase of infection. The lowest concentration of SARS-CoV-2 viral copies this assay can detect is 138 copies/mL. A negative result does not preclude SARS-Cov-2 infection and should not be used as the sole basis for treatment or other patient management decisions. A negative result may occur with  improper specimen collection/handling, submission of specimen other than nasopharyngeal swab, presence of viral mutation(s) within the areas targeted by this assay, and inadequate number of viral copies(<138 copies/mL). A negative result must be combined with clinical observations, patient history, and epidemiological information. The expected result is Negative.  Fact Sheet for Patients:  BloggerCourse.com  Fact Sheet for Healthcare Providers:  SeriousBroker.it  This test is no t yet approved or cleared by the [place]  FDA and  has been authorized for detection and/or diagnosis  of SARS-CoV-2 by FDA under an Emergency Use Authorization (EUA). This EUA will remain  in effect (meaning this test can be used) for the duration of the COVID-19 declaration under Section 564(b)(1) of the Act, 21 U.S.C.section 360bbb-3(b)(1), unless the authorization is terminated  or revoked sooner.       Influenza A by PCR NEGATIVE NEGATIVE Final   Influenza B by PCR NEGATIVE NEGATIVE Final    Comment: (NOTE) The Xpert Xpress SARS-CoV-2/FLU/RSV plus assay is intended as an aid in the diagnosis of influenza from Nasopharyngeal swab specimens and should not be used as a sole basis for treatment. Nasal washings and aspirates are unacceptable for Xpert Xpress SARS-CoV-2/FLU/RSV testing.  Fact Sheet for Patients: BloggerCourse.com  Fact Sheet for Healthcare Providers: SeriousBroker.it  This  test is not yet approved or cleared by the [place]  FDA and has been authorized for detection and/or diagnosis of SARS-CoV-2 by FDA under an Emergency Use Authorization (EUA). This EUA will remain in effect (meaning this test can be used) for the duration of the COVID-19 declaration under Section 564(b)(1) of the Act, 21 U.S.C. section 360bbb-3(b)(1), unless the authorization is terminated or revoked.     Resp Syncytial Virus by PCR NEGATIVE NEGATIVE Final    Comment: (NOTE) Fact Sheet for Patients: BloggerCourse.com  Fact Sheet for Healthcare Providers: SeriousBroker.it  This test is not yet approved or cleared by the [place]  FDA and has been authorized for detection and/or diagnosis of SARS-CoV-2 by FDA under an Emergency Use Authorization (EUA). This EUA will remain in effect (meaning this test can be used) for the duration of the COVID-19 declaration under Section 564(b)(1) of the Act, 21 U.S.C. section 360bbb-3(b)(1), unless the authorization is terminated or revoked.  Performed at [facility], [address], [place], [place] [address]          Radiology Studies: ECHOCARDIOGRAM COMPLETE Result Date: 06/23/2023    ECHOCARDIOGRAM REPORT   Patient Name:   SHANDRIA CLINCH Date of Exam: 06/23/2023 Medical Rec #:  [id number]      Height:       60.0 in Accession #:    [id number]     Weight:       200.0 lb Date of Birth:  Jul 12, 1945       BSA:          1.867 m Patient Age:    77 years       BP:           138/79 mmHg Patient Gender: F              HR:           75 bpm. Exam Location:  [facility] Procedure: 2D Echo, Cardiac Doppler and Color Doppler (Both Spectral and Color            Flow Doppler were utilized during procedure). Indications:    Atrial Fibrillation  History:        Patient has prior history of Echocardiogram examinations, most                 recent 05/11/2016. Risk Factors:Hypertension, Dyslipidemia and                  Former Smoker.                 Aortic Valve: 21 mm Edwards bioprosthetic valve is present in                 the aortic  position. Procedure Date: 01/27/2016.  Sonographer:    Reta Cassis Referring Phys: [id number] OLADAPO ADEFESO IMPRESSIONS  1. Left ventricular ejection fraction, by estimation, is 65 to 70%. The left ventricle has normal function. The left ventricle has no regional wall motion abnormalities. Left ventricular diastolic parameters are consistent with Grade II diastolic dysfunction (pseudonormalization).  2. Right ventricular systolic function is normal. The right ventricular size is mildly enlarged. There is normal pulmonary artery systolic pressure. The estimated right ventricular systolic pressure is 24.0 mmHg.  3. Left atrial size was mildly dilated.  4. The mitral valve is degenerative. Trivial mitral valve regurgitation. No evidence of mitral stenosis.  5. 21 mm bioprosthetic aortic valve with moderate stenosis. Vmax 3.5 m/s, MG 26 mmHG, EOA 1.04 cm2, DI 0.33. The aortic valve has been repaired/replaced. Aortic valve regurgitation is trivial. There is a 21 mm Edwards bioprosthetic valve present in the aortic position. Procedure Date: 01/27/2016. Echo findings are consistent with stenosis of the aortic prosthesis. Aortic valve area, by VTI measures 1.04 cm. Aortic valve mean gradient measures 26.5 mmHg. Aortic valve Vmax measures 3.53 m/s.  6. The inferior vena cava is normal in size with greater than 50% respiratory variability, suggesting right atrial pressure of 3 mmHg. Comparison(s): Changes from prior study are noted. Prosthetic valve stenosis is now present. FINDINGS  Left Ventricle: Left ventricular ejection fraction, by estimation, is 65 to 70%. The left ventricle has normal function. The left ventricle has no regional wall motion abnormalities. The left ventricular internal cavity size was normal in size. There is  no left ventricular hypertrophy. Left ventricular diastolic parameters  are consistent with Grade II diastolic dysfunction (pseudonormalization). Right Ventricle: The right ventricular size is mildly enlarged. No increase in right ventricular wall thickness. Right ventricular systolic function is normal. There is normal pulmonary artery systolic pressure. The tricuspid regurgitant velocity is 2.29  m/s, and with an assumed right atrial pressure of 3 mmHg, the estimated right ventricular systolic pressure is 24.0 mmHg. Left Atrium: Left atrial size was mildly dilated. Right Atrium: Right atrial size was normal in size. Pericardium: There is no evidence of pericardial effusion. Mitral Valve: The mitral valve is degenerative in appearance. Mild to moderate mitral annular calcification. Trivial mitral valve regurgitation. No evidence of mitral valve stenosis. Tricuspid Valve: The tricuspid valve is grossly normal. Tricuspid valve regurgitation is trivial. No evidence of tricuspid stenosis. Aortic Valve: 21 mm bioprosthetic aortic valve with moderate stenosis. Vmax 3.5 m/s, MG 26 mmHG, EOA 1.04 cm2, DI 0.33. The aortic valve has been repaired/replaced. Aortic valve regurgitation is trivial. Aortic valve mean gradient measures 26.5 mmHg. Aortic valve peak gradient measures 49.8 mmHg. Aortic valve area, by VTI measures 1.04 cm. There is a 21 mm Edwards bioprosthetic valve present in the aortic position. Procedure Date: 01/27/2016. Pulmonic Valve: The pulmonic valve was grossly normal. Pulmonic valve regurgitation is not visualized. No evidence of pulmonic stenosis. Aorta: The aortic root and ascending aorta are structurally normal, with no evidence of dilitation. Venous: The inferior vena cava is normal in size with greater than 50% respiratory variability, suggesting right atrial pressure of 3 mmHg. IAS/Shunts: The atrial septum is grossly normal.  LEFT VENTRICLE PLAX 2D LVIDd:         3.80 cm   Diastology LVIDs:         2.90 cm   LV e' medial:    5.66 cm/s LV PW:         1.10 cm   LV E/e'  medial:  21.7 LV IVS:        0.80 cm   LV e' lateral:   9.14 cm/s LVOT diam:     2.00 cm   LV E/e' lateral: 13.5 LV SV:         75 LV SV Index:   40 LVOT Area:     3.14 cm  RIGHT VENTRICLE            IVC RV Basal diam:  4.40 cm    IVC diam: 1.90 cm RV S prime:     8.05 cm/s TAPSE (M-mode): 1.6 cm LEFT ATRIUM             Index        RIGHT ATRIUM           Index LA diam:        4.30 cm 2.30 cm/m   RA Area:     27.00 cm LA Vol (A2C):   87.0 ml 46.61 ml/m  RA Volume:   97.30 ml  52.13 ml/m LA Vol (A4C):   50.7 ml 27.16 ml/m LA Biplane Vol: 66.1 ml 35.41 ml/m  AORTIC VALVE AV Area (Vmax):    0.93 cm AV Area (Vmean):   1.01 cm AV Area (VTI):     1.04 cm AV Vmax:           353.00 cm/s AV Vmean:          233.000 cm/s AV VTI:            0.728 m AV Peak Grad:      49.8 mmHg AV Mean Grad:      26.5 mmHg LVOT Vmax:         105.00 cm/s LVOT Vmean:        74.600 cm/s LVOT VTI:          0.240 m LVOT/AV VTI ratio: 0.33  AORTA Ao Root diam: 2.80 cm Ao Asc diam:  2.80 cm MITRAL VALVE                TRICUSPID VALVE MV Area (PHT): 3.27 cm     TR Peak grad:   21.0 mmHg MV Decel Time: 232 msec     TR Vmax:        229.00 cm/s MV E velocity: 123.00 cm/s MV A velocity: 75.00 cm/s   SHUNTS MV E/A ratio:  1.64         Systemic VTI:  0.24 m                             Systemic Diam: 2.00 cm Jackquelyn Mass MD Electronically signed by Jackquelyn Mass MD Signature Date/Time: 06/23/2023/12:46:07 PM    Final    US  Carotid Bilateral Result Date: 06/23/2023 CLINICAL DATA:  Cerebral infarction, hypertension and hyperlipidemia. EXAM: BILATERAL CAROTID DUPLEX ULTRASOUND TECHNIQUE: Martina Sledge scale imaging, color Doppler and duplex ultrasound were performed of bilateral carotid and vertebral arteries in the neck. COMPARISON:  None Available. FINDINGS: Criteria: Quantification of carotid stenosis is based on velocity parameters that correlate the residual internal carotid diameter with NASCET-based stenosis levels, using the diameter of the distal  internal carotid lumen as the denominator for stenosis measurement. The following velocity measurements were obtained: RIGHT ICA:  94/8 cm/sec CCA:  73/10 cm/sec SYSTOLIC ICA/CCA RATIO:  1.3 ECA:  86 cm/sec LEFT ICA:  75/21 cm/sec CCA:  80/13 cm/sec SYSTOLIC ICA/CCA RATIO:  0.9 ECA:  78 cm/sec RIGHT CAROTID ARTERY:  Moderate calcified plaque at the level of the carotid bulb and proximal right ICA. Estimated right ICA stenosis is less than 50%. RIGHT VERTEBRAL ARTERY: Antegrade flow with normal waveform and velocity. LEFT CAROTID ARTERY: Mild calcified plaque at the level of the left carotid bulb and left ICA origin. Estimated left ICA stenosis is less than 50%. LEFT VERTEBRAL ARTERY: Antegrade flow with normal waveform and velocity. IMPRESSION: Calcified plaque at the level of both carotid bulbs and proximal internal carotid arteries, right greater than left. Estimated bilateral ICA stenoses are less than 50%. Electronically Signed   By: Erica Hau [name]   On: 06/23/2023 12:17   MR BRAIN WO CONTRAST Result Date: 06/23/2023 CLINICAL DATA:  78 year old female neurologic deficit, presentation yesterday. EXAM: MRI HEAD WITHOUT CONTRAST TECHNIQUE: Multiplanar, multiecho pulse sequences of the brain and surrounding structures were obtained without intravenous contrast. COMPARISON:  CT head and CTA head and neck yesterday. FINDINGS: Brain: Scattered small foci of abnormal diffusion in the anterior and posterior circulation. Approximately 4 foci scattered in the right cerebral hemisphere from the posterior MCA to the right PCA territories. Solitary focus in the posterior left temporal lobe. Punctate focus in the central pons (series 5, image 15 and series 7, image 18) and patchy abnormal diffusion in the left lateral medulla (series 5, image 11). These appear isointense to perhaps mildly restricted on ADC. Underlying confluent bilateral cerebral white matter T2 and FLAIR hyperintensity plus scattered small areas of  bilateral white matter cystic encephalomalacia. Chronic lacunar infarcts in the bilateral deep gray nuclei. Small chronic infarcts in the bilateral cerebellum. Chronic microhemorrhage in the left insula. Chronic microhemorrhages also in the left middle frontal gyrus, right occipital lobe. No midline shift, mass effect, evidence of mass lesion, ventriculomegaly, extra-axial collection or acute intracranial hemorrhage. Negative pituitary. Vascular: Major intracranial vascular flow voids are preserved. Skull and upper cervical spine: Negative for age visible cervical spine. Visualized bone marrow signal is within normal limits. Sinuses/Orbits: Negative. Other: Mastoids are clear. Visible internal auditory structures appear normal. Negative visible scalp and face. IMPRESSION: 1. Positive for a Recent Embolic Event, with scattered small subacute infarcts in the anterior and posterior circulation. Notable involvement of the left lateral medulla, the central pons. No associated hemorrhage or mass effect. 2. Underlying fairly severe chronic small vessel disease, including a small number of chronic microhemorrhages. Electronically Signed   By: Marlise Simpers [name]   On: 06/23/2023 08:43   CT ANGIO HEAD NECK W WO CM Result Date: 06/22/2023 CLINICAL DATA:  Neuro deficit, acute, stroke suspected left sided facial numbness EXAM: CT ANGIOGRAPHY HEAD AND NECK WITH AND WITHOUT CONTRAST TECHNIQUE: Multidetector CT imaging of the head and neck was performed using the standard protocol during bolus administration of intravenous contrast. Multiplanar CT image reconstructions and MIPs were obtained to evaluate the vascular anatomy. Carotid stenosis measurements (when applicable) are obtained utilizing NASCET criteria, using the distal internal carotid diameter as the denominator. RADIATION DOSE REDUCTION: This exam was performed according to the departmental dose-optimization program which includes automated exposure control, adjustment of  the mA and/or kV according to patient size and/or use of iterative reconstruction technique. CONTRAST:  75mL OMNIPAQUE  IOHEXOL  350 MG/ML SOLN COMPARISON:  None Available. FINDINGS: CT HEAD FINDINGS Brain: No evidence of acute infarction, hemorrhage, hydrocephalus, extra-axial collection or mass lesion/mass effect. Remote right basal ganglia perforator infarct. Remote left cerebellar infarct. Patchy white matter hypodensities, compatible with chronic microvascular disease. Vascular: See below. Skull: No acute fracture. Sinuses/Orbits: No acute finding. Other: Clear  sinuses.  No acute orbital findings. Review of the MIP images confirms the above findings CTA NECK FINDINGS Aortic arch: Great vessel origins are pain without significant stenosis. Aortic atherosclerosis. Right carotid system: No evidence of dissection, stenosis (50% or greater), or occlusion. Left carotid system: No evidence of dissection, stenosis (50% or greater), or occlusion. Vertebral arteries: Right dominant. No evidence of dissection, stenosis (50% or greater), or occlusion. Skeleton: No acute fracture on limited assessment. Other neck: No acute abnormality on limited assessment. Upper chest: Visualized lung apices are clear. Review of the MIP images confirms the above findings CTA HEAD FINDINGS Anterior circulation: Bilateral intracranial ICAs, MCAs, and ACAs are patent without proximal hemodynamically significant stenosis. Posterior circulation: Bilateral intradural vertebral arteries, basilar artery and bilateral posterior cerebral arteries are patent without proximal hemodynamically significant stenosis. Venous sinuses: As permitted by contrast timing, patent. Review of the MIP images confirms the above findings IMPRESSION: 1. No emergent large vessel occlusion or proximal hemodynamically significant stenosis. 2. 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 3. Aortic Atherosclerosis (ICD10-I70.0). Electronically Signed   By:  Stevenson Elbe [name]   On: 06/22/2023 22:27        Scheduled Meds:  allopurinol   300 mg Oral Daily   apixaban   5 mg Oral BID   calcium -vitamin D   1 tablet Oral Daily   cholecalciferol   2,000 Units Oral Daily   vitamin B-12  1,000 mcg Oral Daily   metoprolol  tartrate  25 mg Oral BID   multivitamin with minerals  1 tablet Oral Daily     LOS: 0 days    Time spent: 35 minutes    Khaleah Duer Loran Rock, DO [facility]  If 7PM-7AM, please contact night-coverage [email or web address] 06/24/2023, 10:10 AM

## 2023-06-24 NOTE — Progress Notes (Signed)
   06/24/23 0851  [place] Medicaid FL2 Level of Care Screening Tool  Provider Number [facility]  Facility and Address [facility]  Current Level of Care Hospital  Requested Level of Care SNF  Discharge Plan SNF  Orientation Self;Time;Situation;Place  Personal Care Assistance Bathing;Feeding;Dressing  Bathing Assistance Independent  Feeding assistance Independent  Dressing Assistance Independent  Ambulatory Status Supervision (Needs assistance)  Functional Limitations Sight;Hearing;Speech  Sight Info Adequate  Hearing Info Adequate  Speech Info Adequate  Bladder Continent  Bowel Continent  Communication of Needs Verbally  Skin Normal  Respiration Normal  Nutrition Status Diet (Heart)  Special Care Factors PT (By licensed PT);OT (By licensed OT)  PT Frequency 5 times weekly  OT Frequency 5 timees weekly  Additional Special Care Factors Code Status  Code Status Info FULL  Contractures Info Not present  Additional Information SSN [id number]   CM spoke with patient at bedside, patient reports she does not have a choice of facility.

## 2023-06-24 NOTE — Progress Notes (Signed)
 Inpatient Rehab Admissions Coordinator Note:   Per PT patient was screened for CIR candidacy by Loni Abdon Annell Barrow, CCC-SLP. At this time, pt appears to be a potential candidate for CIR. I will place an order for rehab consult for full assessment, per our protocol.  Please contact me any with questions.Artemus Larsen, MS, CCC-SLP Admissions Coordinator [phone] 06/24/23 5:35 PM

## 2023-06-25 DIAGNOSIS — I6312 Cerebral infarction due to embolism of basilar artery: Secondary | ICD-10-CM | POA: Diagnosis not present

## 2023-06-25 NOTE — Progress Notes (Signed)
 PROGRESS NOTE    Angela Dawson  GNF:[id number] DOB: 08-Dec-1945 DOA: 06/22/2023 PCP: Delfina Feller, FNP   Brief Narrative:    Angela Dawson is a 78 y.o. female with medical history significant of hypertension, obesity, CVA who presents to the emergency department due to complaint of 5-day onset of perioral tingling, imbalance on ambulation which started after sustaining a fall at home.  She was found to have acute ischemic CVA and underwent neurology evaluation 4/19 with recommendations to continue Eliquis .  PT recommending SNF placement.  Assessment & Plan:   Principal Problem:   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) Active Problems:   Falls   Essential hypertension   Hyperlipidemia   S/P AVR (aortic valve replacement)   New onset atrial fibrillation (HCC)   Prolonged QT interval   History of recent fall   Obesity, Class II, BMI 35-39.9   Hypokalemia   Hypomagnesemia   B12 deficiency  Assessment and Plan:   Acute CVA Left perioral paresthesia CVA -then on MRI with embolic event in the anterior and posterior circulation   - CVA, continue with neurochecks, no new focal neurological findings -Pending MRI of the brain- Positive for a Recent Embolic Event, with scattered small subacute infarcts in the anterior and posterior circulation. Notable involvement of the left lateral medulla, the central pons. No associated hemorrhage or mass effect. -Echo: - Pertinent labs reviewed, within normal limits, suctioning mildly elevated TSH at 5.02, will obtain free T3 and T4, and B12 level   -CT head reviewed, no evidence of acute infarction or hemorrhage CT angiography of head and neck showed no emergent LVO   -Obtaining PT/OT/speech - Will consult neurology for further evaluation - Optimizing medical management, patient already started on Eliquis ,  with LDL goal of less than 70   We will check her lipid panel-but patient has severe allergy to statins.   Therefore not initiating high-dose statins. LDL 106 -A1c 5.3%   New onset Afib  With controlled, continue home medication of Toprol  Chads Vascor greater than 2, therefore starting Eliquis    Essential hypertension Continue home lisinopril , metoprolol    Recent fall -Falls at home -no significant trauma Continue fall precaution PT/OT recommending SNF with placement pending   Vitamin B12 deficiency  - Initiating IV then p.o. vitamin B12 supplements  Obesity, class II -BMI 39.06   DVT prophylaxis: Apixaban  Code Status: Full Family Communication: None at bedside Disposition Plan: Awaiting SNF placement Status is: Inpatient Remains inpatient appropriate because: Need for inpatient rehab placement.    Consultants:  Neurology  Procedures:  None  Antimicrobials:  None   Subjective: Patient seen and evaluated today with no new acute complaints or concerns. No acute concerns or events noted overnight.  Objective: Vitals:   06/25/23 0426 06/25/23 0818 06/25/23 1251 06/25/23 1322  BP: 132/67 (!) 154/65 (!) 144/65 (!) 143/66  Pulse: 65 62 63 (!) 59  Resp:   17   Temp: 98.2 F (36.8 C)  98.2 F (36.8 C) 98.4 F (36.9 C)  TempSrc: Oral   Oral  SpO2: 96% 96% 99% 97%  Weight:      Height:        Intake/Output Summary (Last 24 hours) at 06/25/2023 1451 Last data filed at 06/24/2023 1850 Gross per 24 hour  Intake 240 ml  Output --  Net 240 ml   Filed Weights   06/22/23 1857  Weight: 90.7 kg    Examination:  General exam: Appears calm and comfortable  Respiratory system: Clear to auscultation. Respiratory effort normal. Cardiovascular system: S1 & S2 heard, RRR.  Gastrointestinal system: Abdomen is soft Central nervous system: Alert and awake Extremities: No edema Skin: No significant lesions noted Psychiatry: Flat affect.    Data Reviewed: I have personally reviewed following labs and imaging studies  CBC: Recent Labs  Lab 06/22/23 1921  06/23/23 0428  WBC 9.1 8.7  NEUTROABS 4.9  --   HGB 15.3* 14.0  HCT 44.6 42.5  MCV 92.5 94.0  PLT 212 200   Basic Metabolic Panel: Recent Labs  Lab 06/22/23 1921 06/23/23 0428  NA 141 140  K 3.5 3.2*  CL 101 104  CO2 27 26  GLUCOSE 108* 98  BUN 10 10  CREATININE 0.91 0.90  CALCIUM  9.3 8.9  MG 1.8 1.8  PHOS  --  3.7   GFR: Estimated Creatinine Clearance: 53.4 mL/min (by C-G formula based on SCr of 0.9 mg/dL). Liver Function Tests: Recent Labs  Lab 06/23/23 0428  AST 16  ALT 14  ALKPHOS 64  BILITOT 0.4  PROT 5.9*  ALBUMIN  3.0*   No results for input(s): "LIPASE", "AMYLASE" in the last 168 hours. No results for input(s): "AMMONIA" in the last 168 hours. Coagulation Profile: No results for input(s): "INR", "PROTIME" in the last 168 hours. Cardiac Enzymes: No results for input(s): "CKTOTAL", "CKMB", "CKMBINDEX", "TROPONINI" in the last 168 hours. BNP (last 3 results) No results for input(s): "PROBNP" in the last 8760 hours. HbA1C: Recent Labs    06/23/23 1306  HGBA1C 5.3   CBG: No results for input(s): "GLUCAP" in the last 168 hours. Lipid Profile: Recent Labs    06/23/23 1306  CHOL 169  HDL 40*  LDLCALC 106*  TRIG 114  CHOLHDL 4.2   Thyroid  Function Tests: Recent Labs    06/23/23 0429 06/23/23 1306  TSH 5.027*  --   FREET4  --  0.86  T3FREE  --  2.6   Anemia Panel: Recent Labs    06/23/23 0428 06/23/23 0429  VITAMINB12 108*  --   FOLATE  --  7.8   Sepsis Labs: No results for input(s): "PROCALCITON", "LATICACIDVEN" in the last 168 hours.  Recent Results (from the past 240 hours)  Resp panel by RT-PCR (RSV, Flu A&B, Covid) Anterior Nasal Swab     Status: None   Collection Time: 06/22/23  7:56 PM   Specimen: Anterior Nasal Swab  Result Value Ref Range Status   SARS Coronavirus 2 by RT PCR NEGATIVE NEGATIVE Final    Comment: (NOTE) SARS-CoV-2 target nucleic acids are NOT DETECTED.  The SARS-CoV-2 RNA is generally detectable in upper  respiratory specimens during the acute phase of infection. The lowest concentration of SARS-CoV-2 viral copies this assay can detect is 138 copies/mL. A negative result does not preclude SARS-Cov-2 infection and should not be used as the sole basis for treatment or other patient management decisions. A negative result may occur with  improper specimen collection/handling, submission of specimen other than nasopharyngeal swab, presence of viral mutation(s) within the areas targeted by this assay, and inadequate number of viral copies(<138 copies/mL). A negative result must be combined with clinical observations, patient history, and epidemiological information. The expected result is Negative.  Fact Sheet for Patients:  BloggerCourse.com  Fact Sheet for Healthcare Providers:  SeriousBroker.it  This test is no t yet approved or cleared by the [place]  FDA and  has been authorized for detection and/or diagnosis of SARS-CoV-2 by FDA under an Emergency Use Authorization (  EUA). This EUA will remain  in effect (meaning this test can be used) for the duration of the COVID-19 declaration under Section 564(b)(1) of the Act, 21 U.S.C.section 360bbb-3(b)(1), unless the authorization is terminated  or revoked sooner.       Influenza A by PCR NEGATIVE NEGATIVE Final   Influenza B by PCR NEGATIVE NEGATIVE Final    Comment: (NOTE) The Xpert Xpress SARS-CoV-2/FLU/RSV plus assay is intended as an aid in the diagnosis of influenza from Nasopharyngeal swab specimens and should not be used as a sole basis for treatment. Nasal washings and aspirates are unacceptable for Xpert Xpress SARS-CoV-2/FLU/RSV testing.  Fact Sheet for Patients: BloggerCourse.com  Fact Sheet for Healthcare Providers: SeriousBroker.it  This test is not yet approved or cleared by the [place]  FDA and has been  authorized for detection and/or diagnosis of SARS-CoV-2 by FDA under an Emergency Use Authorization (EUA). This EUA will remain in effect (meaning this test can be used) for the duration of the COVID-19 declaration under Section 564(b)(1) of the Act, 21 U.S.C. section 360bbb-3(b)(1), unless the authorization is terminated or revoked.     Resp Syncytial Virus by PCR NEGATIVE NEGATIVE Final    Comment: (NOTE) Fact Sheet for Patients: BloggerCourse.com  Fact Sheet for Healthcare Providers: SeriousBroker.it  This test is not yet approved or cleared by the [place]  FDA and has been authorized for detection and/or diagnosis of SARS-CoV-2 by FDA under an Emergency Use Authorization (EUA). This EUA will remain in effect (meaning this test can be used) for the duration of the COVID-19 declaration under Section 564(b)(1) of the Act, 21 U.S.C. section 360bbb-3(b)(1), unless the authorization is terminated or revoked.  Performed at [facility], [address], [place], [place] [address]          Radiology Studies: No results found.    Scheduled Meds:  allopurinol   300 mg Oral Daily   apixaban   5 mg Oral BID   calcium -vitamin D   1 tablet Oral Daily   cholecalciferol   2,000 Units Oral Daily   vitamin B-12  1,000 mcg Oral Daily   metoprolol  tartrate  25 mg Oral BID   multivitamin with minerals  1 tablet Oral Daily     LOS: 1 day    Time spent: 35 minutes    Rudolpho Claxton Loran Rock, DO [facility]  If 7PM-7AM, please contact night-coverage [email or web address] 06/25/2023, 2:51 PM

## 2023-06-25 NOTE — Plan of Care (Signed)
  Problem: Health Behavior/Discharge Planning: Goal: Ability to manage health-related needs will improve Outcome: Progressing   Problem: Activity: Goal: Risk for activity intolerance will decrease Outcome: Progressing   Problem: Pain Managment: Goal: General experience of comfort will improve and/or be controlled Outcome: Progressing   Problem: Education: Goal: Knowledge of disease or condition will improve Outcome: Progressing Goal: Knowledge of secondary prevention will improve (MUST DOCUMENT ALL) Outcome: Progressing Goal: Knowledge of patient specific risk factors will improve (DELETE if not current risk factor) Outcome: Progressing   Problem: Ischemic Stroke/TIA Tissue Perfusion: Goal: Complications of ischemic stroke/TIA will be minimized Outcome: Progressing   Problem: Coping: Goal: Will verbalize positive feelings about self Outcome: Progressing Goal: Will identify appropriate support needs Outcome: Progressing   Problem: Self-Care: Goal: Verbalization of feelings and concerns over difficulty with self-care will improve Outcome: Progressing Goal: Ability to communicate needs accurately will improve Outcome: Progressing

## 2023-06-25 NOTE — Plan of Care (Signed)
   Problem: Health Behavior/Discharge Planning: Goal: Ability to manage health-related needs will improve Outcome: Progressing   Problem: Activity: Goal: Risk for activity intolerance will decrease Outcome: Progressing   Problem: Pain Managment: Goal: General experience of comfort will improve and/or be controlled Outcome: Progressing

## 2023-06-25 NOTE — Progress Notes (Signed)
 SLP Cancellation Note  Patient Details Name: Angela Dawson MRN: [id number] DOB: 07-Mar-1945   Cancelled treatment:       Reason Eval/Treat Not Completed: SLP screened, no needs identified, will sign off. Per RN Pt passed [facility] swallow screen which has not been yet documented in flowsheets; Pt has been tolerating a regular/thin diet since that time without incident. Further RN reports some cognitive changes; if SLE is warranted please order, our service will sign off for Dysphagia. Thank you,  Svara Twyman H. Vergil Glasser, CCC-SLP Speech Language Pathologist    Florina Husbands 06/25/2023, 1:12 PM

## 2023-06-25 NOTE — Evaluation (Signed)
 Occupational Therapy Evaluation Patient Details Name: Angela Dawson MRN: [id number] DOB: 12-20-1945 Today's Date: 06/25/2023   History of Present Illness   Angela Dawson is a 78 y.o. female with medical history significant of hypertension, obesity, CVA who presents to the emergency department due to complaint of 5-day onset of perioral tingling, imbalance on ambulation which started after sustaining a fall at home.  Patient stated that she stumbled over her dog on Monday (4/14) and fell face down on her cemented porch, she states that she hit her head on the door, both sustaining no loss of consciousness.  She has been having intermittent headaches and tingling to left side of mouth, imbalance with need to hold onto things on ambulation and lightheadedness since onset of symptoms.  She denies chest pain, shortness of breath, nausea, vomiting.     Clinical Impressions Pt agreeable to OT and PT co-evaluation/treat. Pt is independent at baseline. Today pt required no physical assist for bed mobility and CGA for step pivot to bed from chair. More assisted needed for ambulation with use of RW and increased instability towards the end of ambulation today. Pt can complete seated ADL's well with CGA for anything standing. Pt also reports intermittent blurry vision. No significant deficits noted today. Pt left in the chair with call bell within reach.      If plan is discharge home, recommend the following:   A little help with walking and/or transfers;A little help with bathing/dressing/bathroom;Assistance with cooking/housework;Assist for transportation;Help with stairs or ramp for entrance     Functional Status Assessment   Patient has had a recent decline in their functional status and demonstrates the ability to make significant improvements in function in a reasonable and predictable amount of time.     Equipment Recommendations   None recommended by OT              Precautions/Restrictions   Precautions Precautions: Fall Recall of Precautions/Restrictions: Intact Restrictions Weight Bearing Restrictions Per Provider Order: No     Mobility Bed Mobility Overal bed mobility: Needs Assistance Bed Mobility: Supine to Sit     Supine to sit: Modified independent (Device/Increase time)     General bed mobility comments: mild labored effort    Transfers Overall transfer level: Needs assistance Equipment used: None, Rolling walker (2 wheels) Transfers: Sit to/from Stand, Bed to chair/wheelchair/BSC Sit to Stand: Contact guard assist     Step pivot transfers: Contact guard assist     General transfer comment: Without AD pt was CGA for step pivot to bed from chair. RW needed for ambulation with mild increase in assist and more loss of balance noted with ambulation.      Balance Overall balance assessment: Needs assistance Sitting-balance support: No upper extremity supported, Feet supported Sitting balance-Leahy Scale: Good Sitting balance - Comments: seated in recliner   Standing balance support: No upper extremity supported, During functional activity Standing balance-Leahy Scale: Poor Standing balance comment: poor to fair without AD                           ADL either performed or assessed with clinical judgement   ADL Overall ADL's : Needs assistance/impaired Eating/Feeding: Independent;Sitting   Grooming: Sitting;Modified independent   Upper Body Bathing: Sitting;Modified independent   Lower Body Bathing: Set up;Sitting/lateral leans   Upper Body Dressing : Modified independent;Sitting   Lower Body Dressing: Set up;Sitting/lateral leans   Toilet Transfer: Contact guard assist;Minimal assistance;Rolling walker (  2 wheels);Stand-pivot;Ambulation Toilet Transfer Details (indicate cue type and reason): Simulated via EOB to chair transfer and ambulation in the hall with RW. Toileting- Clothing Manipulation and  Hygiene: Contact guard assist;Set up;Sitting/lateral lean;Sit to/from stand   Tub/ Shower Transfer: Contact guard assist;Minimal assistance;Rolling walker (2 wheels);Ambulation   Functional mobility during ADLs: Contact guard assist;Minimal assistance;Rolling walker (2 wheels) General ADL Comments: Pt able to ambulate a short distance in the hall. Noted to need min A to maintain balance at times when returning to the chair.     Vision Baseline Vision/History: 1 Wears glasses Ability to See in Adequate Light: 1 Impaired Patient Visual Report: Diplopia;Blurring of vision Vision Assessment?: Yes Eye Alignment: Within Functional Limits Tracking/Visual Pursuits: Able to track stimulus in all quads without difficulty Convergence: Within functional limits Visual Fields: No apparent deficits Additional Comments: Pt does report blurry vision at times when doing sit to stand. Able to read the board well. No functional deficits noted.     Perception Perception: Not tested       Praxis Praxis: Not tested       Pertinent Vitals/Pain Pain Assessment Pain Assessment: Faces Faces Pain Scale: No hurt     Extremity/Trunk Assessment Upper Extremity Assessment Upper Extremity Assessment:  (WFL strength; mild gross motor/proprioceptive deficits for finger to nose test bilaterally.)   Lower Extremity Assessment Lower Extremity Assessment: Defer to PT evaluation   Cervical / Trunk Assessment Cervical / Trunk Assessment: Normal   Communication Communication Communication: No apparent difficulties   Cognition Arousal: Alert Behavior During Therapy: WFL for tasks assessed/performed Cognition: No apparent impairments                               Following commands: Intact       Cueing  General Comments   Cueing Techniques: Verbal cues  Pt reported nausea after taking pills to start the session.              Home Living Family/patient expects to be discharged to::  Private residence Living Arrangements: Alone Available Help at Discharge: Family;Available PRN/intermittently Type of Home: Mobile home Home Access: Ramped entrance     Home Layout: One level     Bathroom Shower/Tub: Producer, television/film/video: Standard Bathroom Accessibility: Yes   Home Equipment: Cane - single point;Shower seat   Additional Comments: per chart      Prior Functioning/Environment Prior Level of Function : Independent/Modified Independent             Mobility Comments: Community ambulation without AD, drives ADLs Comments: Independent    OT Problem List: Decreased activity tolerance;Impaired balance (sitting and/or standing);Decreased coordination   OT Treatment/Interventions: Self-care/ADL training;Therapeutic exercise;Therapeutic activities;Patient/family education;Balance training;DME and/or AE instruction;Neuromuscular education;Visual/perceptual remediation/compensation      OT Goals(Current goals can be found in the care plan section)   Acute Rehab OT Goals Patient Stated Goal: improve function OT Goal Formulation: With patient Time For Goal Achievement: 07/09/23 Potential to Achieve Goals: Good   OT Frequency:  Min 2X/week    Co-evaluation PT/OT/SLP Co-Evaluation/Treatment: Yes Reason for Co-Treatment: To address functional/ADL transfers   OT goals addressed during session: ADL's and self-care                       End of Session Equipment Utilized During Treatment: Rolling walker (2 wheels)  Activity Tolerance: Patient tolerated treatment well Patient left: in chair;with call bell/phone within reach  OT Visit Diagnosis: Unsteadiness on feet (R26.81);Other abnormalities of gait and mobility (R26.89);Muscle weakness (generalized) (M62.81);Other symptoms and signs involving the nervous system (I69.629)                Time: 5284-1324 OT Time Calculation (min): 19 min Charges:  OT General Charges $OT Visit: 1  Visit OT Evaluation $OT Eval Low Complexity: 1 Low  Chantea Surace OT, MOT  Thurnell Floss 06/25/2023, 9:55 AM

## 2023-06-25 NOTE — Progress Notes (Signed)
 Physical Therapy Treatment Patient Details Name: Angela Dawson MRN: [id number] DOB: May 02, 1945 Today's Date: 06/25/2023   History of Present Illness Angela Dawson is a 78 y.o. female with medical history significant of hypertension, obesity, CVA who presents to the emergency department due to complaint of 5-day onset of perioral tingling, imbalance on ambulation which started after sustaining a fall at home.  Patient stated that she stumbled over her dog on Monday (4/14) and fell face down on her cemented porch, she states that she hit her head on the door, both sustaining no loss of consciousness.  She has been having intermittent headaches and tingling to left side of mouth, imbalance with need to hold onto things on ambulation and lightheadedness since onset of symptoms.  She denies chest pain, shortness of breath, nausea, vomiting.    PT Comments  Patient demonstrates improvement for getting into/out of bed  with HOB flat, able to take a few steps in room without AD, but requires use of RW for longer distances due to frequent drifting to the right and occasion stumbling on RLE, followed with w/c for safety and limited mostly due to fatigue. Patient tolerated sitting up in chair after therapy. Patient will benefit from continued skilled physical therapy in hospital and recommended venue below to increase strength, balance, endurance for safe ADLs and gait.      If plan is discharge home, recommend the following: A lot of help with walking and/or transfers;Help with stairs or ramp for entrance;Assistance with cooking/housework;A lot of help with bathing/dressing/bathroom   Can travel by private vehicle        Equipment Recommendations  Rolling walker (2 wheels)    Recommendations for Other Services       Precautions / Restrictions Precautions Precautions: Fall Restrictions Weight Bearing Restrictions Per Provider Order: No     Mobility  Bed Mobility Overal bed mobility: Needs  Assistance Bed Mobility: Supine to Sit, Sit to Supine     Supine to sit: Modified independent (Device/Increase time)     General bed mobility comments: improvement with less assistance for bed mobility    Transfers Overall transfer level: Needs assistance Equipment used: None, Rolling walker (2 wheels) Transfers: Sit to/from Stand, Bed to chair/wheelchair/BSC Sit to Stand: Contact guard assist   Step pivot transfers: Contact guard assist       General transfer comment: unsteady labored movement, safer using RW    Ambulation/Gait Ambulation/Gait assistance: Min assist Gait Distance (Feet): 45 Feet Assistive device: Rolling walker (2 wheels) Gait Pattern/deviations: Decreased step length - right, Decreased step length - left, Decreased stride length, Drifts right/left Gait velocity: decreased     General Gait Details: able to take a few steps in room without AD, but requires use of RW for longer distances due to drifting to the right and frequent stumbling   Stairs             Wheelchair Mobility     Tilt Bed    Modified Rankin (Stroke Patients Only)       Balance Overall balance assessment: Needs assistance Sitting-balance support: Feet supported, No upper extremity supported Sitting balance-Leahy Scale: Good Sitting balance - Comments: seated at EOB   Standing balance support: During functional activity, No upper extremity supported Standing balance-Leahy Scale: Poor Standing balance comment: fair/poor without AD, fair using RW                            Communication  Communication Communication: No apparent difficulties  Cognition Arousal: Alert Behavior During Therapy: WFL for tasks assessed/performed   PT - Cognitive impairments: No apparent impairments                         Following commands: Intact      Cueing Cueing Techniques: Verbal cues  Exercises      General Comments General comments (skin integrity,  edema, etc.): Pt reported nausea after taking pills to start the session.      Pertinent Vitals/Pain Pain Assessment Pain Assessment: No/denies pain    Home Living Family/patient expects to be discharged to:: Private residence Living Arrangements: Alone Available Help at Discharge: Family;Available PRN/intermittently Type of Home: Mobile home Home Access: Ramped entrance       Home Layout: One level Home Equipment: Cane - single point;Shower seat Additional Comments: per chart    Prior Function            PT Goals (current goals can now be found in the care plan section) Acute Rehab PT Goals Patient Stated Goal: return home PT Goal Formulation: With patient Time For Goal Achievement: 07/07/23 Potential to Achieve Goals: Good Progress towards PT goals: Progressing toward goals    Frequency    Min 4X/week      PT Plan      Co-evaluation PT/OT/SLP Co-Evaluation/Treatment: Yes Reason for Co-Treatment: To address functional/ADL transfers PT goals addressed during session: Mobility/safety with mobility;Balance;Proper use of DME OT goals addressed during session: ADL's and self-care      AM-PAC PT "6 Clicks" Mobility   Outcome Measure  Help needed turning from your back to your side while in a flat bed without using bedrails?: None Help needed moving from lying on your back to sitting on the side of a flat bed without using bedrails?: None Help needed moving to and from a bed to a chair (including a wheelchair)?: A Little Help needed standing up from a chair using your arms (e.g., wheelchair or bedside chair)?: A Little Help needed to walk in hospital room?: A Little Help needed climbing 3-5 steps with a railing? : A Lot 6 Click Score: 19    End of Session Equipment Utilized During Treatment: Gait belt Activity Tolerance: Patient tolerated treatment well;Patient limited by fatigue Patient left: in chair;with call bell/phone within reach Nurse Communication:  Mobility status PT Visit Diagnosis: Unsteadiness on feet (R26.81);Other abnormalities of gait and mobility (R26.89);Muscle weakness (generalized) (M62.81)     Time: 1610-9604 PT Time Calculation (min) (ACUTE ONLY): 29 min  Charges:    $Gait Training: 8-22 mins $Therapeutic Activity: 8-22 mins PT General Charges $$ ACUTE PT VISIT: 1 Visit                     10:41 AM, 06/25/23 Walton Guppy, MPT Physical Therapist with [facility] [phone] office [phone] mobile phone

## 2023-06-25 NOTE — PMR Pre-admission (Shared)
 PMR Admission Coordinator Pre-Admission Assessment  Patient: Angela Dawson is an 78 y.o., female MRN: [id number] DOB: 08-Sep-1945 Height: 5' (152.4 cm) Weight: 90.7 kg  Insurance Information HMO:     PPO: yes     PCP:      IPA:      80/20:      OTHER:  PRIMARY: [employer] Medicare HMO/PPO      Policy#: [id number]      Subscriber: patient CM Name: ***      Phone#: ***     Fax#: [phone] Pre-Cert#: [id number]      Employer:  Benefits:  Phone #: [phone]     Name:  Venson Ginger. Date: 03/07/23     Deduct: does not have      Out of Pocket Max: $4,150 ($0 met)      Life Max: NA CIR: $300/day co-pay with a max co-pay of $1,800/admission      SNF: $10/day co-pay for days 1-20, $214/day co-pay for days 21-100 Outpatient: $10 co-pay/visit     Co-Pay:  Home Health: 100% coverage      Co-Pay:  DME: 80% coverage     Co-Pay: 20% co-insurance Providers: in-network SECONDARY:       Policy#:      Phone#:   Artist:       Phone#:   The Data processing manager" for patients in Inpatient Rehabilitation Facilities with attached "Privacy Act Statement-Health Care Records" was provided and verbally reviewed with: {CHL IP Patient Family OZ:366440347}  Emergency Contact Information Contact Information     Name Relation Home Work Mobile   Angela Dawson Son   [phone]   Angela Dawson   [phone]      Other Contacts   None on File     Current Medical History  Patient Admitting Diagnosis: CVA History of Present Illness: Pt is a 77 year old female with medical hx significant for: AVR, HLD, obesity, HTN. Pt presented to [facility] on 06/22/23 d/t complaint of left perioral tingling, fatigue and head injury. On 4/125 pt stumbled over dog and hit her head on the door. Pt did not have LOC. Pt had intermittent headaches and balance disturbance as well. New onset A-fib noted on EKG. CT negative for acute infarct. CTA negative for LVO. MRI showed recent embolic event with  scattered small subacute infarcts in anterior and posterior circulation. Neurology consulted. Therapy evaluations completed and CIR recommended d/t pt's deficits in functional mobility. Complete NIHSS TOTAL: 0  Patient's medical record from [facility] has been reviewed by the rehabilitation admission coordinator and physician.  Past Medical History  Past Medical History:  Diagnosis Date   Allergy    seasonal   Chicken pox    Complication of anesthesia    difficult to wake up after tubal ligations   Dyspnea    with exertion   Heart murmur    History of bronchitis    History of kidney stones    "only one"   Hypertension    Measles     Has the patient had major surgery during 100 days prior to admission? No  Family History   family history includes Alzheimer's disease in her mother; Breast cancer in her maternal aunt; Heart attack in her son; Heart attack (age of onset: 80) in her father; Heart disease in her mother; Other in her son; Post-traumatic stress disorder in her brother.  Current Medications  Current Facility-Administered Medications:    acetaminophen  (TYLENOL ) tablet 650 mg, 650  mg, Oral, Q6H PRN **OR** acetaminophen  (TYLENOL ) suppository 650 mg, 650 mg, Rectal, Q6H PRN, Adefeso, Oladapo, DO   allopurinol  (ZYLOPRIM ) tablet 300 mg, 300 mg, Oral, Daily, Shahmehdi, Seyed A, MD, 300 mg at 06/25/23 2130   apixaban  (ELIQUIS ) tablet 5 mg, 5 mg, Oral, BID, Adefeso, Oladapo, DO, 5 mg at 06/25/23 8657   calcium -vitamin D  (OSCAL WITH D) 500-5 MG-MCG per tablet 1 tablet, 1 tablet, Oral, Daily, Shahmehdi, Seyed A, MD, 1 tablet at 06/25/23 8469   cholecalciferol  (VITAMIN D3) 25 MCG (1000 UNIT) tablet 2,000 Units, 2,000 Units, Oral, Daily, Shahmehdi, Seyed A, MD, 2,000 Units at 06/25/23 6295   cyanocobalamin  (VITAMIN B12) tablet 1,000 mcg, 1,000 mcg, Oral, Daily, Shahmehdi, Seyed A, MD, 1,000 mcg at 06/25/23 0819   metoprolol  tartrate (LOPRESSOR ) tablet 25 mg, 25 mg, Oral, BID,  Shahmehdi, Seyed A, MD, 25 mg at 06/25/23 2841   multivitamin with minerals tablet 1 tablet, 1 tablet, Oral, Daily, Khaliqdina, Salman, MD, 1 tablet at 06/25/23 0818   ondansetron  (ZOFRAN ) tablet 4 mg, 4 mg, Oral, Q6H PRN **OR** ondansetron  (ZOFRAN ) injection 4 mg, 4 mg, Intravenous, Q6H PRN, Adefeso, Oladapo, DO  Patients Current Diet:  Diet Order             Diet Heart Room service appropriate? Yes; Fluid consistency: Thin  Diet effective now                   Precautions / Restrictions Precautions Precautions: Fall Restrictions Weight Bearing Restrictions Per Provider Order: No   Has the patient had 2 or more falls or a fall with injury in the past year? Yes  Prior Activity Level Community (5-7x/wk): drives, gets out of house daily  Prior Functional Level Self Care: Did the patient need help bathing, dressing, using the toilet or eating? Independent  Indoor Mobility: Did the patient need assistance with walking from room to room (with or without device)? Independent  Stairs: Did the patient need assistance with internal or external stairs (with or without device)? Independent  Functional Cognition: Did the patient need help planning regular tasks such as shopping or remembering to take medications? Independent  Patient Information Are you of Hispanic, Latino/a,or Spanish origin?: A. No, not of Hispanic, Latino/a, or Spanish origin What is your race?: A. White Do you need or want an interpreter to communicate with a doctor or health care staff?: 0. No  Patient's Response To:  Health Literacy and Transportation Is the patient able to respond to health literacy and transportation needs?: Yes Health Literacy - How often do you need to have someone help you when you read instructions, pamphlets, or other written material from your doctor or pharmacy?: Never In the past 12 months, has lack of transportation kept you from medical appointments or from getting medications?:  No In the past 12 months, has lack of transportation kept you from meetings, work, or from getting things needed for daily living?: No  Home Assistive Devices / Equipment Home Equipment: Cane - single point, Shower seat  Prior Device Use: Indicate devices/aids used by the patient prior to current illness, exacerbation or injury? None of the above  Current Functional Level Cognition  Orientation Level: Oriented X4    Extremity Assessment (includes Sensation/Coordination)  Upper Extremity Assessment:  (WFL strength; mild gross motor/proprioceptive deficits for finger to nose test bilaterally.)  Lower Extremity Assessment: Defer to PT evaluation    ADLs  Overall ADL's : Needs assistance/impaired Eating/Feeding: Independent, Sitting Grooming: Sitting, Modified independent Upper Body  Bathing: Sitting, Modified independent Lower Body Bathing: Set up, Sitting/lateral leans Upper Body Dressing : Modified independent, Sitting Lower Body Dressing: Set up, Sitting/lateral leans Toilet Transfer: Contact guard assist, Minimal assistance, Rolling walker (2 wheels), Stand-pivot, Ambulation Toilet Transfer Details (indicate cue type and reason): Simulated via EOB to chair transfer and ambulation in the hall with RW. Toileting- Clothing Manipulation and Hygiene: Contact guard assist, Set up, Sitting/lateral lean, Sit to/from stand Tub/ Shower Transfer: Contact guard assist, Minimal assistance, Rolling walker (2 wheels), Ambulation Functional mobility during ADLs: Contact guard assist, Minimal assistance, Rolling walker (2 wheels) General ADL Comments: Pt able to ambulate a short distance in the hall. Noted to need min A to maintain balance at times when returning to the chair.    Mobility  Overal bed mobility: Needs Assistance Bed Mobility: Supine to Sit, Sit to Supine Supine to sit: Modified independent (Device/Increase time) General bed mobility comments: improvement with less assistance for bed  mobility    Transfers  Overall transfer level: Needs assistance Equipment used: None, Rolling walker (2 wheels) Transfers: Sit to/from Stand, Bed to chair/wheelchair/BSC Sit to Stand: Contact guard assist Bed to/from chair/wheelchair/BSC transfer type:: Step pivot Step pivot transfers: Contact guard assist General transfer comment: unsteady labored movement, safer using RW    Ambulation / Gait / Stairs / Wheelchair Mobility  Ambulation/Gait Ambulation/Gait assistance: Editor, commissioning (Feet): 45 Feet Assistive device: Rolling walker (2 wheels) Gait Pattern/deviations: Decreased step length - right, Decreased step length - left, Decreased stride length, Drifts right/left General Gait Details: able to take a few steps in room without AD, but requires use of RW for longer distances due to drifting to the right and frequent stumbling Gait velocity: decreased    Posture / Balance Dynamic Sitting Balance Sitting balance - Comments: seated at EOB Balance Overall balance assessment: Needs assistance Sitting-balance support: Feet supported, No upper extremity supported Sitting balance-Leahy Scale: Good Sitting balance - Comments: seated at EOB Standing balance support: During functional activity, No upper extremity supported Standing balance-Leahy Scale: Poor Standing balance comment: fair/poor without AD, fair using RW    Special needs/care consideration Skin Abrasion: hand, knee/bilateral; Ecchymosis: flank, hip/bilateral and Bladder incontinence   Previous Home Environment (from acute therapy documentation) Living Arrangements: Alone Available Help at Discharge: Family, Friend(s), Available PRN/intermittently Type of Home: Mobile home Home Layout: One level Home Access: Ramped entrance Bathroom Shower/Tub: Health visitor: Standard Bathroom Accessibility: Yes How Accessible: Accessible via walker Home Care Services: No Additional Comments: per  chart  Discharge Living Setting Plans for Discharge Living Setting: House (pt moving to a  house beside son and daughter-in-law) Type of Home at Discharge: House Discharge Home Layout: One level Discharge Home Access: Stairs to enter Entrance Stairs-Rails: None Entrance Stairs-Number of Steps: 1 Discharge Bathroom Shower/Tub: Walk-in shower, Tub/shower unit Discharge Bathroom Toilet: Standard Discharge Bathroom Accessibility: Yes How Accessible: Accessible via walker Does the patient have any problems obtaining your medications?: No  Social/Family/Support Systems Anticipated Caregiver: BJand Sneha Willig (son and daughter-in-law) Anticipated Caregiver's Contact Information: BJ: [phone] Caregiver Availability: Intermittent Discharge Plan Discussed with Primary Caregiver: Yes Is Caregiver In Agreement with Plan?: Yes Does Caregiver/Family have Issues with Lodging/Transportation while Pt is in Rehab?: No  Goals Patient/Family Goal for Rehab: Mod I-Supervision: PT; Mod I:OT Expected length of stay: 7 days Pt/Family Agrees to Admission and willing to participate: Yes Program Orientation Provided & Reviewed with Pt/Caregiver Including Roles  & Responsibilities: Yes  Decrease burden of Care through IP rehab  admission: NA  Possible need for SNF placement upon discharge: Not anticipated  Patient Condition: I have reviewed medical records from [facility], spoken with CSW, and patient, son, and family member. I discussed via phone for inpatient rehabilitation assessment.  Patient will benefit from ongoing PT and OT, can actively participate in 3 hours of therapy a day 5 days of the week, and can make measurable gains during the admission.  Patient will also benefit from the coordinated team approach during an Inpatient Acute Rehabilitation admission.  The patient will receive intensive therapy as well as Rehabilitation physician, nursing, social worker, and care management  interventions.  Due to bladder management, safety, skin/wound care, disease management, medication administration, pain management, and patient education the patient requires 24 hour a day rehabilitation nursing.  The patient is currently *** with mobility and basic ADLs.  Discharge setting and therapy post discharge at home with home health is anticipated.  Patient has agreed to participate in the Acute Inpatient Rehabilitation Program and will admit {Time; today/tomorrow:10263}.  Preadmission Screen Completed By:  Amiel Kalata, 06/25/2023 1:11 PM ______________________________________________________________________   Discussed status with Dr. [name] on *** at *** and received approval for admission today.  Admission Coordinator:  Amiel Kalata, CCC-SLP, time ***/Date ***   Assessment/Plan: Diagnosis: *** Does the need for close, 24 hr/day Medical supervision in concert with the patient's rehab needs make it unreasonable for this patient to be served in a less intensive setting? {yes_no_potentially:3041433} Co-Morbidities requiring supervision/potential complications: *** Due to {due XB:2841324}, does the patient require 24 hr/day rehab nursing? {yes_no_potentially:3041433} Does the patient require coordinated care of a physician, rehab nurse, PT, OT, and SLP to address physical and functional deficits in the context of the above medical diagnosis(es)? {yes_no_potentially:3041433} Addressing deficits in the following areas: {deficits:3041436} Can the patient actively participate in an intensive therapy program of at least 3 hrs of therapy 5 days a week? {yes_no_potentially:3041433} The potential for patient to make measurable gains while on inpatient rehab is {potential:3041437} Anticipated functional outcomes upon discharge from inpatient rehab: {functional outcomes:304600100} PT, {functional outcomes:304600100} OT, {functional outcomes:304600100} SLP Estimated rehab length of  stay to reach the above functional goals is: *** Anticipated discharge destination: {anticipated dc setting:21604} 10. Overall Rehab/Functional Prognosis: {potential:3041437}   MD Signature: ***

## 2023-06-25 NOTE — Progress Notes (Signed)
 Mobility Specialist Progress Note:    06/25/23 0950  Mobility  Activity Ambulated with assistance in room;Ambulated with assistance to bathroom  Level of Assistance Minimal assist, patient does 75% or more  Assistive Device Front wheel walker  Distance Ambulated (ft) 35 ft  Range of Motion/Exercises Active;All extremities  Activity Response Tolerated well  Mobility Referral Yes  Mobility visit 1 Mobility  Mobility Specialist Start Time (ACUTE ONLY) 0940  Mobility Specialist Stop Time (ACUTE ONLY) 0955  Mobility Specialist Time Calculation (min) (ACUTE ONLY) 15 min   Pt received requesting assistance to bathroom. Required MinA to stand and CGA to ambulate with RW. Tolerated well, asx throughout. Returned to chair, call bell in reach. All needs met.  Glinda Lapping Mobility Specialist Please contact via Special educational needs teacher or  Rehab office at [phone]

## 2023-06-25 NOTE — Progress Notes (Addendum)
 Inpatient Rehab Admissions:  Inpatient Rehab Consult received.  I spoke with patient on the telephone for rehabilitation assessment and to discuss goals and expectations of an inpatient rehab admission.  Discussed average length of stay, insurance authorization requirement and discharge home after completion of CIR. Pt acknowledged understanding. Pt is interested in pursuing CIR. Clista gave permission to contact son BJ. Called BJ and left a message. Awaiting return call. Will continue to follow.  1129: Received call back from pt's son BJ and his wife America Just. They both acknowledged understanding of CIR goals and expectations. They are supportive of pt pursuing CIR. They acknowledged that family and friends will be able to provide support for pt after discharge.   1241: Insurance authorization started.   Signed: Artemus Larsen, MS, CCC-SLP Admissions Coordinator [phone]

## 2023-06-25 NOTE — Plan of Care (Signed)
  Problem: Acute Rehab OT Goals (only OT should resolve) Goal: Pt. Will Perform Grooming Flowsheets (Taken 06/25/2023 0957) Pt Will Perform Grooming:  with modified independence  standing Goal: Pt. Will Perform Lower Body Bathing Flowsheets (Taken 06/25/2023 0957) Pt Will Perform Lower Body Bathing:  with modified independence  sit to/from stand Goal: Pt. Will Perform Lower Body Dressing Flowsheets (Taken 06/25/2023 0957) Pt Will Perform Lower Body Dressing:  with modified independence  sitting/lateral leans  sit to/from stand Goal: Pt. Will Transfer To Toilet Flowsheets (Taken 06/25/2023 0957) Pt Will Transfer to Toilet:  with modified independence  ambulating Goal: Pt. Will Perform Toileting-Clothing Manipulation Flowsheets (Taken 06/25/2023 0957) Pt Will Perform Toileting - Clothing Manipulation and hygiene: with modified independence  Emeri Estill OT, MOT

## 2023-06-26 DIAGNOSIS — I6312 Cerebral infarction due to embolism of basilar artery: Secondary | ICD-10-CM | POA: Diagnosis not present

## 2023-06-26 NOTE — Care Management Important Message (Signed)
 Important Message  Patient Details  Name: Angela Dawson MRN: [id number] Date of Birth: 1945-10-25   Important Message Given:  N/A - LOS <3 / Initial given by admissions     Neila Bally 06/26/2023, 11:14 AM

## 2023-06-26 NOTE — Progress Notes (Signed)
 PROGRESS NOTE    Angela Dawson  ZOX:[id number] DOB: Jul 03, 1945 DOA: 06/22/2023 PCP: Delfina Feller, FNP   Brief Narrative:    Angela Dawson is a 78 y.o. female with medical history significant of hypertension, obesity, CVA who presents to the emergency department due to complaint of 5-day onset of perioral tingling, imbalance on ambulation which started after sustaining a fall at home.  She was found to have acute ischemic CVA and underwent neurology evaluation 4/19 with recommendations to continue Eliquis .  PT recommending SNF placement which is currently pending.  Assessment & Plan:   Principal Problem:   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) Active Problems:   Falls   Essential hypertension   Hyperlipidemia   S/P AVR (aortic valve replacement)   New onset atrial fibrillation (HCC)   Prolonged QT interval   History of recent fall   Obesity, Class II, BMI 35-39.9   Hypokalemia   Hypomagnesemia   B12 deficiency  Assessment and Plan:   Acute CVA Left perioral paresthesia CVA -then on MRI with embolic event in the anterior and posterior circulation   - CVA, continue with neurochecks, no new focal neurological findings -Pending MRI of the brain- Positive for a Recent Embolic Event, with scattered small subacute infarcts in the anterior and posterior circulation. Notable involvement of the left lateral medulla, the central pons. No associated hemorrhage or mass effect. -Echo: - Pertinent labs reviewed, within normal limits, suctioning mildly elevated TSH at 5.02, will obtain free T3 and T4, and B12 level   -CT head reviewed, no evidence of acute infarction or hemorrhage CT angiography of head and neck showed no emergent LVO   -Obtaining PT/OT/speech - Will consult neurology for further evaluation - Optimizing medical management, patient already started on Eliquis ,  with LDL goal of less than 70   We will check her lipid panel-but patient has severe  allergy to statins.  Therefore not initiating high-dose statins. LDL 106 -A1c 5.3%   New onset Afib  With controlled, continue home medication of Toprol  Chads Vascor greater than 2, therefore starting Eliquis    Essential hypertension Continue home lisinopril , metoprolol    Recent fall -Falls at home -no significant trauma Continue fall precaution PT/OT recommending SNF with placement pending   Vitamin B12 deficiency  - Initiating IV then p.o. vitamin B12 supplements  Obesity, class II -BMI 39.06   DVT prophylaxis: Apixaban  Code Status: Full Family Communication: None at bedside Disposition Plan: Awaiting SNF placement Status is: Inpatient Remains inpatient appropriate because: Need for inpatient rehab placement.    Consultants:  Neurology  Procedures:  None  Antimicrobials:  None   Subjective: Patient seen and evaluated today with no new acute complaints or concerns. No acute concerns or events noted overnight.  Objective: Vitals:   06/25/23 2009 06/25/23 2229 06/26/23 0442 06/26/23 1503  BP: (!) 133/90 (!) 157/62 134/83 132/62  Pulse: 75 68 62 63  Resp: 18  16 (!) 24  Temp: 98.4 F (36.9 C)  98.3 F (36.8 C) 98.5 F (36.9 C)  TempSrc: Oral  Oral Oral  SpO2: 96%  96% 96%  Weight:      Height:        Intake/Output Summary (Last 24 hours) at 06/26/2023 1605 Last data filed at 06/26/2023 1500 Gross per 24 hour  Intake 960 ml  Output --  Net 960 ml   Filed Weights   06/22/23 1857  Weight: 90.7 kg    Examination:  General exam: Appears calm  and comfortable  Respiratory system: Clear to auscultation. Respiratory effort normal. Cardiovascular system: S1 & S2 heard, RRR.  Gastrointestinal system: Abdomen is soft Central nervous system: Alert and awake Extremities: No edema Skin: No significant lesions noted Psychiatry: Flat affect.    Data Reviewed: I have personally reviewed following labs and imaging studies  CBC: Recent Labs  Lab  06/22/23 1921 06/23/23 0428  WBC 9.1 8.7  NEUTROABS 4.9  --   HGB 15.3* 14.0  HCT 44.6 42.5  MCV 92.5 94.0  PLT 212 200   Basic Metabolic Panel: Recent Labs  Lab 06/22/23 1921 06/23/23 0428  NA 141 140  K 3.5 3.2*  CL 101 104  CO2 27 26  GLUCOSE 108* 98  BUN 10 10  CREATININE 0.91 0.90  CALCIUM  9.3 8.9  MG 1.8 1.8  PHOS  --  3.7   GFR: Estimated Creatinine Clearance: 53.4 mL/min (by C-G formula based on SCr of 0.9 mg/dL). Liver Function Tests: Recent Labs  Lab 06/23/23 0428  AST 16  ALT 14  ALKPHOS 64  BILITOT 0.4  PROT 5.9*  ALBUMIN  3.0*   No results for input(s): "LIPASE", "AMYLASE" in the last 168 hours. No results for input(s): "AMMONIA" in the last 168 hours. Coagulation Profile: No results for input(s): "INR", "PROTIME" in the last 168 hours. Cardiac Enzymes: No results for input(s): "CKTOTAL", "CKMB", "CKMBINDEX", "TROPONINI" in the last 168 hours. BNP (last 3 results) No results for input(s): "PROBNP" in the last 8760 hours. HbA1C: No results for input(s): "HGBA1C" in the last 72 hours.  CBG: No results for input(s): "GLUCAP" in the last 168 hours. Lipid Profile: No results for input(s): "CHOL", "HDL", "LDLCALC", "TRIG", "CHOLHDL", "LDLDIRECT" in the last 72 hours.  Thyroid  Function Tests: No results for input(s): "TSH", "T4TOTAL", "FREET4", "T3FREE", "THYROIDAB" in the last 72 hours.  Anemia Panel: No results for input(s): "VITAMINB12", "FOLATE", "FERRITIN", "TIBC", "IRON", "RETICCTPCT" in the last 72 hours.  Sepsis Labs: No results for input(s): "PROCALCITON", "LATICACIDVEN" in the last 168 hours.  Recent Results (from the past 240 hours)  Resp panel by RT-PCR (RSV, Flu A&B, Covid) Anterior Nasal Swab     Status: None   Collection Time: 06/22/23  7:56 PM   Specimen: Anterior Nasal Swab  Result Value Ref Range Status   SARS Coronavirus 2 by RT PCR NEGATIVE NEGATIVE Final    Comment: (NOTE) SARS-CoV-2 target nucleic acids are NOT  DETECTED.  The SARS-CoV-2 RNA is generally detectable in upper respiratory specimens during the acute phase of infection. The lowest concentration of SARS-CoV-2 viral copies this assay can detect is 138 copies/mL. A negative result does not preclude SARS-Cov-2 infection and should not be used as the sole basis for treatment or other patient management decisions. A negative result may occur with  improper specimen collection/handling, submission of specimen other than nasopharyngeal swab, presence of viral mutation(s) within the areas targeted by this assay, and inadequate number of viral copies(<138 copies/mL). A negative result must be combined with clinical observations, patient history, and epidemiological information. The expected result is Negative.  Fact Sheet for Patients:  BloggerCourse.com  Fact Sheet for Healthcare Providers:  SeriousBroker.it  This test is no t yet approved or cleared by the [place]  FDA and  has been authorized for detection and/or diagnosis of SARS-CoV-2 by FDA under an Emergency Use Authorization (EUA). This EUA will remain  in effect (meaning this test can be used) for the duration of the COVID-19 declaration under Section 564(b)(1) of the Act,  21 U.S.C.section 360bbb-3(b)(1), unless the authorization is terminated  or revoked sooner.       Influenza A by PCR NEGATIVE NEGATIVE Final   Influenza B by PCR NEGATIVE NEGATIVE Final    Comment: (NOTE) The Xpert Xpress SARS-CoV-2/FLU/RSV plus assay is intended as an aid in the diagnosis of influenza from Nasopharyngeal swab specimens and should not be used as a sole basis for treatment. Nasal washings and aspirates are unacceptable for Xpert Xpress SARS-CoV-2/FLU/RSV testing.  Fact Sheet for Patients: BloggerCourse.com  Fact Sheet for Healthcare Providers: SeriousBroker.it  This test is not yet  approved or cleared by the [place]  FDA and has been authorized for detection and/or diagnosis of SARS-CoV-2 by FDA under an Emergency Use Authorization (EUA). This EUA will remain in effect (meaning this test can be used) for the duration of the COVID-19 declaration under Section 564(b)(1) of the Act, 21 U.S.C. section 360bbb-3(b)(1), unless the authorization is terminated or revoked.     Resp Syncytial Virus by PCR NEGATIVE NEGATIVE Final    Comment: (NOTE) Fact Sheet for Patients: BloggerCourse.com  Fact Sheet for Healthcare Providers: SeriousBroker.it  This test is not yet approved or cleared by the [place]  FDA and has been authorized for detection and/or diagnosis of SARS-CoV-2 by FDA under an Emergency Use Authorization (EUA). This EUA will remain in effect (meaning this test can be used) for the duration of the COVID-19 declaration under Section 564(b)(1) of the Act, 21 U.S.C. section 360bbb-3(b)(1), unless the authorization is terminated or revoked.  Performed at [facility], [address], [place], [place] [address]          Radiology Studies: No results found.    Scheduled Meds:  allopurinol   300 mg Oral Daily   apixaban   5 mg Oral BID   calcium -vitamin D   1 tablet Oral Daily   cholecalciferol   2,000 Units Oral Daily   vitamin B-12  1,000 mcg Oral Daily   metoprolol  tartrate  25 mg Oral BID   multivitamin with minerals  1 tablet Oral Daily     LOS: 2 days    Time spent: 35 minutes    Rhyder Bratz Loran Rock, DO [facility]  If 7PM-7AM, please contact night-coverage [email or web address] 06/26/2023, 4:05 PM

## 2023-06-26 NOTE — NC FL2 (Signed)
 [place]  MEDICAID FL2 LEVEL OF CARE FORM     IDENTIFICATION  Patient Name: Angela Dawson Birthdate: 1945/11/12 Sex: female Admission Date (Current Location): 06/22/2023  [facility] and [place] Number:  [employer] and Address:  [facility],  [address] [place] [address]      Provider Number: [id number]  Attending Physician Name and Address:  Cornelius Dill, DO  Relative Name and Phone Number:       Current Level of Care: Hospital Recommended Level of Care: Skilled Nursing Facility Prior Approval Number:    Date Approved/Denied:   PASRR Number: [id number]  Discharge Plan: SNF    Current Diagnoses: Patient Active Problem List   Diagnosis Date Noted   New onset atrial fibrillation (HCC) 06/23/2023   Prolonged QT interval 06/23/2023   History of recent fall 06/23/2023   Obesity, Class II, BMI 35-39.9 06/23/2023   Falls 06/23/2023   Hypokalemia 06/23/2023   Hypomagnesemia 06/23/2023   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) 06/23/2023   B12 deficiency 06/23/2023   Statin intolerance 10/03/2019   S/P AVR (aortic valve replacement) 01/17/2016   Ascending aorta dilatation (HCC) 11/20/2013   Hyperlipidemia 09/30/2013   Severe obesity (BMI >= 40) (HCC) 09/30/2013   Essential hypertension 07/17/2012    Orientation RESPIRATION BLADDER Height & Weight     Self, Time, Situation, Place  Normal Continent Weight: 200 lb (90.7 kg) Height:  5' (152.4 cm)  BEHAVIORAL SYMPTOMS/MOOD NEUROLOGICAL BOWEL NUTRITION STATUS      Continent Diet (See d/c summary)  AMBULATORY STATUS COMMUNICATION OF NEEDS Skin   Extensive Assist Verbally Skin abrasions, Bruising                       Personal Care Assistance Level of Assistance  Bathing, Feeding, Dressing Bathing Assistance: Maximum assistance Feeding assistance: Limited assistance Dressing Assistance: Maximum assistance     Functional Limitations Info  Sight, Hearing, Speech Sight  Info: Adequate Hearing Info: Adequate Speech Info: Adequate    SPECIAL CARE FACTORS FREQUENCY  PT (By licensed PT)     PT Frequency: 5x weekly OT Frequency: 5 timees weekly            Contractures Contractures Info: Not present    Additional Factors Info  Code Status, Allergies Code Status Info: Full code Allergies Info: Lipitor (Atorvastatin )  Allopurinol   Colchicine   Meloxicam   Statins  Crestor  (Rosuvastatin )  Penicillins           Current Medications (06/26/2023):  This is the current hospital active medication list Current Facility-Administered Medications  Medication Dose Route Frequency Provider Last Rate Last Admin   acetaminophen  (TYLENOL ) tablet 650 mg  650 mg Oral Q6H PRN Adefeso, Oladapo, DO       Or   acetaminophen  (TYLENOL ) suppository 650 mg  650 mg Rectal Q6H PRN Adefeso, Oladapo, DO       allopurinol  (ZYLOPRIM ) tablet 300 mg  300 mg Oral Daily Shahmehdi, Seyed A, MD   300 mg at 06/26/23 0817   apixaban  (ELIQUIS ) tablet 5 mg  5 mg Oral BID Adefeso, Oladapo, DO   5 mg at 06/26/23 0818   calcium -vitamin D  (OSCAL WITH D) 500-5 MG-MCG per tablet 1 tablet  1 tablet Oral Daily Amber Bail A, MD   1 tablet at 06/26/23 0819   cholecalciferol  (VITAMIN D3) 25 MCG (1000 UNIT) tablet 2,000 Units  2,000 Units Oral Daily Amber Bail A, MD   2,000 Units at 06/26/23  1610   cyanocobalamin  (VITAMIN B12) tablet 1,000 mcg  1,000 mcg Oral Daily Shahmehdi, Seyed A, MD   1,000 mcg at 06/26/23 9604   metoprolol  tartrate (LOPRESSOR ) tablet 25 mg  25 mg Oral BID Amber Bail A, MD   25 mg at 06/26/23 5409   multivitamin with minerals tablet 1 tablet  1 tablet Oral Daily Khaliqdina, Salman, MD   1 tablet at 06/26/23 0818   ondansetron  (ZOFRAN ) tablet 4 mg  4 mg Oral Q6H PRN Adefeso, Oladapo, DO       Or   ondansetron  (ZOFRAN ) injection 4 mg  4 mg Intravenous Q6H PRN Adefeso, Oladapo, DO         Discharge Medications: Please see discharge summary for a list of discharge  medications.  Relevant Imaging Results:  Relevant Lab Results:   Additional Information SSN: [id number]  Ander Katos, LCSW

## 2023-06-26 NOTE — Progress Notes (Signed)
 Physical Therapy Treatment Patient Details Name: Angela Dawson MRN: [id number] DOB: March 24, 1945 Today's Date: 06/26/2023   History of Present Illness Angela Dawson is a 78 y.o. female with medical history significant of hypertension, obesity, CVA who presents to the emergency department due to complaint of 5-day onset of perioral tingling, imbalance on ambulation which started after sustaining a fall at home.  Patient stated that she stumbled over her dog on Monday (4/14) and fell face down on her cemented porch, she states that she hit her head on the door, both sustaining no loss of consciousness.  She has been having intermittent headaches and tingling to left side of mouth, imbalance with need to hold onto things on ambulation and lightheadedness since onset of symptoms.  She denies chest pain, shortness of breath, nausea, vomiting.    PT Comments  Patient tolerated session well. Patient received sitting in recliner with visitors present but agreeable to PT treatment. LE exercises performed in seating positions for general warm up. Difficulty with hip flexion 2/2 prior hip issues/pain. Patient ambulates in hall with CGA/min A, unsteadiness noted. Pt drifts R/L with one instance of moderate LOB/staggering, requiring min A for steadying. Patient educated on safe use of RW with proximity to AD, due to patient demonstrating increased distance. Patient returned to recliner at end of session with call bell near and chair alarm activated. Patient will benefit from continued skilled physical therapy acutely and in recommended venue in order to address remaining deficits to return to PLOF and improve QOL.    If plan is discharge home, recommend the following: A lot of help with walking and/or transfers;Help with stairs or ramp for entrance;Assistance with cooking/housework;A lot of help with bathing/dressing/bathroom   Can travel by private vehicle        Equipment Recommendations  Rolling walker (2  wheels)    Recommendations for Other Services       Precautions / Restrictions Precautions Precautions: Fall Recall of Precautions/Restrictions: Intact Restrictions Weight Bearing Restrictions Per Provider Order: No     Mobility  Bed Mobility       General bed mobility comments: Not assessed. Pt received sitting in recliner    Transfers Overall transfer level: Needs assistance Equipment used: Rolling walker (2 wheels) Transfers: Sit to/from Stand Sit to Stand: Contact guard assist     General transfer comment: Pt requiring inc time, and labored movement. Needed rest after seated LE exercises before STS    Ambulation/Gait Ambulation/Gait assistance: Contact guard assist, Min assist Gait Distance (Feet): 50 Feet Assistive device: Rolling walker (2 wheels) Gait Pattern/deviations: Decreased step length - right, Decreased step length - left, Decreased stride length, Trunk flexed, Staggering right, Drifts right/left Gait velocity: decreased     General Gait Details: w/ the above deviations, RW and CGA mostly during trial. min A needed with one moderate stagger/stumbling once returned to room. pt reports constant dizziness that does not increase during gait. Seated BP 117/100. Pt reports around normal. Does take meds for BP control. no SOB/dyspnea noted t/o. Trial terminated d/t LE weakness/mild buckling   Stairs    Wheelchair Mobility     Tilt Bed    Modified Rankin (Stroke Patients Only)       Balance Overall balance assessment: Needs assistance Sitting-balance support: Feet supported, Bilateral upper extremity supported Sitting balance-Leahy Scale: Good Sitting balance - Comments: Seated in recliner   Standing balance support: During functional activity, Reliant on assistive device for balance, Bilateral upper extremity supported Standing balance-Leahy Scale:  Fair Standing balance comment: w/ RW during gait trial        Communication  Communication Communication: No apparent difficulties  Cognition Arousal: Alert Behavior During Therapy: WFL for tasks assessed/performed   PT - Cognitive impairments: No apparent impairments     Following commands: Intact      Cueing Cueing Techniques: Verbal cues, Visual cues  Exercises General Exercises - Lower Extremity Ankle Circles/Pumps: AROM, Strengthening, Both, 10 reps, Seated Long Arc Quad: AROM, Strengthening, Both, 10 reps, Seated Hip Flexion/Marching: AROM, Strengthening, Both, 10 reps, Seated    General Comments        Pertinent Vitals/Pain Pain Assessment Pain Assessment: No/denies pain    Home Living                          Prior Function            PT Goals (current goals can now be found in the care plan section) Acute Rehab PT Goals Patient Stated Goal: return home PT Goal Formulation: With patient Time For Goal Achievement: 07/07/23 Potential to Achieve Goals: Good Progress towards PT goals: Progressing toward goals    Frequency    Min 4X/week      PT Plan      Co-evaluation     PT goals addressed during session: Proper use of DME;Mobility/safety with mobility;Strengthening/ROM        AM-PAC PT "6 Clicks" Mobility   Outcome Measure  Help needed turning from your back to your side while in a flat bed without using bedrails?: None Help needed moving from lying on your back to sitting on the side of a flat bed without using bedrails?: None Help needed moving to and from a bed to a chair (including a wheelchair)?: A Little Help needed standing up from a chair using your arms (e.g., wheelchair or bedside chair)?: A Little Help needed to walk in hospital room?: A Little Help needed climbing 3-5 steps with a railing? : A Lot 6 Click Score: 19    End of Session Equipment Utilized During Treatment: Gait belt Activity Tolerance: Patient tolerated treatment well Patient left: in chair;with call bell/phone within reach;with  family/visitor present;with chair alarm set   PT Visit Diagnosis: Unsteadiness on feet (R26.81);Other abnormalities of gait and mobility (R26.89);Muscle weakness (generalized) (M62.81)     Time: 2951-8841 PT Time Calculation (min) (ACUTE ONLY): 21 min  Charges:    $Therapeutic Exercise: 8-22 mins PT General Charges $$ ACUTE PT VISIT: 1 Visit                     1:45 PM, 06/26/23 Sneijder Bernards Powell-Butler, PT, DPT [place] with [facility]

## 2023-06-26 NOTE — Progress Notes (Deleted)
 Had very small bm last night. Has had no vomiting since Sunday and denies abd pain or tenderness.  Active bowel sounds and asking for something to eat. Dr. Larrie Po ordered clear liquid diet.  Blood glucose 68 previous to getting juice.

## 2023-06-26 NOTE — Progress Notes (Addendum)
 Alert and oriented x 4.  Showing no stoke deficits except moved  very slowly with walker to bathroom and seems sleepy. Says having increased urinary incontinence.  Usually lives at home alone and still drives.

## 2023-06-26 NOTE — Progress Notes (Signed)
 Inpatient Rehabilitation Admissions Coordinator   I spoke with patient by phone and reviewed estimated cost of care for CIR if [employer] approves CIR admit. She is in agreement. I await [employer] determination.  Jeannetta Millman, RN, MSN Rehab Admissions Coordinator [phone] 06/26/2023 9:49 AM

## 2023-06-26 NOTE — Progress Notes (Signed)
 Inpatient Rehabilitation Admissions Coordinator   I have received a denial from [facility] for CIR admit at [facility] in [place]. I contacted both patient and her son by phone and they are aware. They wish to pursue short term SNF. I have contacted acute team and TOC of denial. We will sign off.  Jeannetta Millman, RN, MSN Rehab Admissions Coordinator [phone] 06/26/2023 3:37 PM

## 2023-06-27 DIAGNOSIS — E66812 Obesity, class 2: Secondary | ICD-10-CM | POA: Diagnosis not present

## 2023-06-27 DIAGNOSIS — E876 Hypokalemia: Secondary | ICD-10-CM | POA: Diagnosis not present

## 2023-06-27 DIAGNOSIS — I6312 Cerebral infarction due to embolism of basilar artery: Secondary | ICD-10-CM | POA: Diagnosis not present

## 2023-06-27 DIAGNOSIS — D519 Vitamin B12 deficiency anemia, unspecified: Secondary | ICD-10-CM | POA: Diagnosis not present

## 2023-06-27 DIAGNOSIS — R2681 Unsteadiness on feet: Secondary | ICD-10-CM | POA: Diagnosis not present

## 2023-06-27 DIAGNOSIS — M6281 Muscle weakness (generalized): Secondary | ICD-10-CM | POA: Diagnosis not present

## 2023-06-27 DIAGNOSIS — I77810 Thoracic aortic ectasia: Secondary | ICD-10-CM | POA: Diagnosis not present

## 2023-06-27 DIAGNOSIS — I4891 Unspecified atrial fibrillation: Secondary | ICD-10-CM | POA: Diagnosis not present

## 2023-06-27 DIAGNOSIS — I10 Essential (primary) hypertension: Secondary | ICD-10-CM | POA: Diagnosis not present

## 2023-06-27 DIAGNOSIS — Z9181 History of falling: Secondary | ICD-10-CM | POA: Diagnosis not present

## 2023-06-27 DIAGNOSIS — I480 Paroxysmal atrial fibrillation: Secondary | ICD-10-CM | POA: Diagnosis not present

## 2023-06-27 DIAGNOSIS — R262 Difficulty in walking, not elsewhere classified: Secondary | ICD-10-CM | POA: Diagnosis not present

## 2023-06-27 DIAGNOSIS — E039 Hypothyroidism, unspecified: Secondary | ICD-10-CM | POA: Diagnosis not present

## 2023-06-27 DIAGNOSIS — R29818 Other symptoms and signs involving the nervous system: Secondary | ICD-10-CM | POA: Diagnosis not present

## 2023-06-27 DIAGNOSIS — E782 Mixed hyperlipidemia: Secondary | ICD-10-CM | POA: Diagnosis not present

## 2023-06-27 DIAGNOSIS — I700 Atherosclerosis of aorta: Secondary | ICD-10-CM | POA: Diagnosis not present

## 2023-06-27 DIAGNOSIS — R0602 Shortness of breath: Secondary | ICD-10-CM | POA: Diagnosis not present

## 2023-06-27 DIAGNOSIS — E785 Hyperlipidemia, unspecified: Secondary | ICD-10-CM | POA: Diagnosis not present

## 2023-06-27 DIAGNOSIS — Z952 Presence of prosthetic heart valve: Secondary | ICD-10-CM | POA: Diagnosis not present

## 2023-06-27 DIAGNOSIS — M1A4710 Other secondary chronic gout, right ankle and foot, without tophus (tophi): Secondary | ICD-10-CM | POA: Diagnosis not present

## 2023-06-27 DIAGNOSIS — R4189 Other symptoms and signs involving cognitive functions and awareness: Secondary | ICD-10-CM | POA: Diagnosis not present

## 2023-06-27 DIAGNOSIS — J3089 Other allergic rhinitis: Secondary | ICD-10-CM | POA: Diagnosis not present

## 2023-06-27 DIAGNOSIS — Z8673 Personal history of transient ischemic attack (TIA), and cerebral infarction without residual deficits: Secondary | ICD-10-CM | POA: Diagnosis not present

## 2023-06-27 DIAGNOSIS — E441 Mild protein-calorie malnutrition: Secondary | ICD-10-CM | POA: Diagnosis not present

## 2023-06-27 DIAGNOSIS — R488 Other symbolic dysfunctions: Secondary | ICD-10-CM | POA: Diagnosis not present

## 2023-06-27 DIAGNOSIS — E538 Deficiency of other specified B group vitamins: Secondary | ICD-10-CM | POA: Diagnosis not present

## 2023-06-27 MED ORDER — ENSURE ENLIVE PO LIQD: 237.0000 mL | Freq: Two times a day (BID) | ORAL | 12 refills | Status: AC

## 2023-06-27 MED ORDER — ENSURE ENLIVE PO LIQD
237.0000 mL | Freq: Two times a day (BID) | ORAL | Status: DC
  Administered 2023-06-27: 237 mL via ORAL

## 2023-06-27 MED ORDER — APIXABAN 5 MG PO TABS: 5.0000 mg | ORAL_TABLET | Freq: Two times a day (BID) | ORAL | 0 refills | Status: DC

## 2023-06-27 NOTE — Progress Notes (Signed)
 Physical Therapy Treatment Patient Details Name: Angela Dawson MRN: [id number] DOB: May 23, 1945 Today's Date: 06/27/2023   History of Present Illness Angela Dawson is a 78 y.o. female with medical history significant of hypertension, obesity, CVA who presents to the emergency department due to complaint of 5-day onset of perioral tingling, imbalance on ambulation which started after sustaining a fall at home.  Patient stated that she stumbled over her dog on Monday (4/14) and fell face down on her cemented porch, she states that she hit her head on the door, both sustaining no loss of consciousness.  She has been having intermittent headaches and tingling to left side of mouth, imbalance with need to hold onto things on ambulation and lightheadedness since onset of symptoms.  She denies chest pain, shortness of breath, nausea, vomiting.    PT Comments  Pt tolerated today's treatment session, well but did experienced increased dizziness in L eye greater than R eye after transfer training. Pt with normal BP of 157/70 and HR 63, pt educated on vestibular difference can be normal with strokes and will improve with time. Today's session addressed progressing functional activity tolerance and ambulation capacity through multiplanar movement. Pt noted with continued unsteadiness and LLE endurance deficits as occasional buckling of L knee did occur with no LOB noted. Pt progressing in ambulation distance and introduced lateral side steps for increased hip abduction activation, tolerated well. Pt would continue to benefit from skilled acute physical therapy services in order to progress toward POC goals, safety/independence with functional mobility and QOL.     If plan is discharge home, recommend the following: A lot of help with walking and/or transfers;Help with stairs or ramp for entrance;Assistance with cooking/housework;A lot of help with bathing/dressing/bathroom   Can travel by private vehicle         Equipment Recommendations  Rolling walker (2 wheels)    Recommendations for Other Services       Precautions / Restrictions       Mobility  Bed Mobility Overal bed mobility: Modified Independent Bed Mobility: Supine to Sit     Supine to sit: Modified independent (Device/Increase time)     General bed mobility comments: mod i for bed mobility Patient Response: Cooperative  Transfers Overall transfer level: Needs assistance Equipment used: Rolling walker (2 wheels) Transfers: Sit to/from Stand Sit to Stand: Contact guard assist           General transfer comment: CGA for 8x sit/stand to RW. 1x STS from EOB to RW, cues for proper UE placement. 7x after ambulation from recliner with single UE assist. pt with reports of increased dizziness with multiple sit/stands. After ambulation pt requesting to go to bathroom, CGA for sit/stand transfers and negotiation of bathroom environment, cues given for use of handrail when powering to stand.    Ambulation/Gait Ambulation/Gait assistance: Contact guard assist Gait Distance (Feet): 55 Feet Assistive device: Rolling walker (2 wheels) Gait Pattern/deviations: Decreased step length - right, Decreased step length - left, Decreased stride length, Trunk flexed, Staggering right, Drifts right/left       General Gait Details: w/ the above deviations, RW and CGA mostly during ambulation todate. Pt with mild L knee buckling but no staggering. Noted with increased ER of LLE during stance and swing phase. Pt also ambulation at CGA with lateral stepping and BUE on handrail in hallway.   Stairs             Wheelchair Mobility     Tilt Bed Tilt  Bed Patient Response: Cooperative  Modified Rankin (Stroke Patients Only)       Balance Overall balance assessment: Needs assistance Sitting-balance support: Feet supported, Bilateral upper extremity supported Sitting balance-Leahy Scale: Good Sitting balance - Comments: Seated in  recliner   Standing balance support: During functional activity, Reliant on assistive device for balance, Bilateral upper extremity supported Standing balance-Leahy Scale: Fair Standing balance comment: w/ RW during gait trial                            Communication Communication Communication: No apparent difficulties  Cognition Arousal: Alert Behavior During Therapy: WFL for tasks assessed/performed   PT - Cognitive impairments: No apparent impairments                         Following commands: Intact      Cueing Cueing Techniques: Verbal cues, Visual cues  Exercises General Exercises - Lower Extremity Long Arc Quad: AROM, Strengthening, Both, Seated, 20 reps Toe Raises: Seated, AROM, Strengthening, Both, 20 reps Other Exercises Other Exercises: 22ft of lateral side steps on handrail; CGA for balance, cues for neutrally rotated LLE during swing and stance phase of lateral stepping.    General Comments General comments (skin integrity, edema, etc.): reports increased dizziness after sit/stands.      Pertinent Vitals/Pain Pain Assessment Pain Assessment: No/denies pain    Home Living                          Prior Function            PT Goals (current goals can now be found in the care plan section) Acute Rehab PT Goals Patient Stated Goal: return home PT Goal Formulation: With patient Time For Goal Achievement: 07/07/23 Potential to Achieve Goals: Good    Frequency    Min 4X/week      PT Plan      Co-evaluation              AM-PAC PT "6 Clicks" Mobility   Outcome Measure  Help needed turning from your back to your side while in a flat bed without using bedrails?: None Help needed moving from lying on your back to sitting on the side of a flat bed without using bedrails?: None Help needed moving to and from a bed to a chair (including a wheelchair)?: A Little Help needed standing up from a chair using your arms  (e.g., wheelchair or bedside chair)?: A Little Help needed to walk in hospital room?: A Little Help needed climbing 3-5 steps with a railing? : A Lot 6 Click Score: 19    End of Session Equipment Utilized During Treatment: Gait belt Activity Tolerance: Patient tolerated treatment well Patient left: in chair;with call bell/phone within reach;with family/visitor present;with chair alarm set   PT Visit Diagnosis: Unsteadiness on feet (R26.81);Other abnormalities of gait and mobility (R26.89);Muscle weakness (generalized) (M62.81)     Time: 1610-9604 PT Time Calculation (min) (ACUTE ONLY): 26 min  Charges:    $Therapeutic Exercise: 8-22 mins $Therapeutic Activity: 8-22 mins PT General Charges $$ ACUTE PT VISIT: 1 Visit                     Astrid Lay, DPT [facility] [place] [address] [phone] office   Gatha Kaska 06/27/2023, 11:50 AM

## 2023-06-27 NOTE — Progress Notes (Signed)
 Pt discharged via WC to [facility]. All personal belongings in pt's possession.

## 2023-06-27 NOTE — TOC Progression Note (Signed)
 Transition of Care [facility]) - Progression Note    Patient Details  Name: Angela Dawson MRN: [id number] Date of Birth: 01-20-1946  Transition of [facility]) CM/SW Contact  Ander Katos, [place] Phone Number: 06/27/2023, 8:51 AM  Clinical Narrative:   Pt agreeable to SNF. LCSW discussed placement and Medicare.gov ratings. She requests [facility] if available. [facility] can offer. CMA starting authorization.       Barriers to Discharge: Insurance Authorization  Expected Discharge Plan and Services                                               Social Determinants of Health (SDOH) Interventions SDOH Screenings   Food Insecurity: No Food Insecurity (06/23/2023)  Housing: Low Risk  (06/23/2023)  Transportation Needs: No Transportation Needs (06/23/2023)  Utilities: Not At Risk (06/23/2023)  Alcohol Screen: Low Risk  (11/08/2021)  Depression (PHQ2-9): Low Risk  (02/05/2023)  Financial Resource Strain: Low Risk  (11/08/2021)  Physical Activity: Insufficiently Active (11/08/2021)  Social Connections: Socially Isolated (06/23/2023)  Stress: No Stress Concern Present (11/08/2021)  Tobacco Use: Medium Risk (06/22/2023)    Readmission Risk Interventions     No data to display

## 2023-06-27 NOTE — Progress Notes (Signed)
 Have attempted to call pt report to [facility] x3 but calls not answered. Pt aware of pending discharge to [facility] and agreeable.

## 2023-06-27 NOTE — Discharge Summary (Signed)
 Physician Discharge Summary  Angela Dawson:[id number] DOB: Mar 17, 1945 DOA: 06/22/2023  PCP: Angela Feller, FNP  Admit date: 06/22/2023  Discharge date: 06/27/2023  Admitted From:Home  Disposition:  SNF  Recommendations for Outpatient Follow-up:  Follow up with PCP in 1-2 weeks Follow-up with neurology Dr. Janett Medin in 6-8 weeks with referral sent Remain on Eliquis  as prescribed Remain on other medications as noted below  Home Health: None  Equipment/Devices: None none  Discharge Condition:Stable  CODE STATUS: Full  Diet recommendation: Heart Healthy  Brief/Interim Summary: Angela Dawson is a 78 y.o. female with medical history significant of hypertension, obesity, CVA who presents to the emergency department due to complaint of 5-day onset of perioral tingling, imbalance on ambulation which started after sustaining a fall at home.  She was found to have acute ischemic CVA and underwent neurology evaluation 4/19 with recommendations to continue Eliquis .  PT recommending SNF placement and she now has a bed available and can discharge to her facility.  No other acute events or concerns noted.  Discharge Diagnoses:  Principal Problem:   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) Active Problems:   Falls   Essential hypertension   Hyperlipidemia   S/P AVR (aortic valve replacement)   New onset atrial fibrillation (HCC)   Prolonged QT interval   History of recent fall   Obesity, Class II, BMI 35-39.9   Hypokalemia   Hypomagnesemia   B12 deficiency  Principal discharge diagnosis: Acute CVA-ischemic.  New onset atrial fibrillation.  Discharge Instructions  Discharge Instructions     Diet - low sodium heart healthy   Complete by: As directed    Increase activity slowly   Complete by: As directed       Allergies as of 06/27/2023       Reactions   Lipitor [atorvastatin ] Other (See Comments)   Unable to stand or move   Allopurinol  Diarrhea, Nausea  And Vomiting   Pt reported this and colchicine  both caused "a lot of stomach issues"   Colchicine  Diarrhea, Nausea And Vomiting   Pt reported this and allopurinol  both caused "a lot of stomach issues"   Meloxicam  Other (See Comments)   Made patient dizzy and caused her heart to race but can take a half tablet   Statins Other (See Comments)   Leg cramps    Crestor  [rosuvastatin ] Other (See Comments)   Dizziness   Penicillins Rash   Immediate rash, facial/tongue/throat swelling, SOB or lightheadedness with hypotension        Medication List     STOP taking these medications    aspirin  EC 81 MG tablet   benazepril  10 MG tablet Commonly known as: LOTENSIN    lisinopril  20 MG tablet Commonly known as: ZESTRIL        TAKE these medications    acetaminophen  325 MG tablet Commonly known as: TYLENOL  Take 2 tablets (650 mg total) by mouth every 6 (six) hours as needed for mild pain.   apixaban  5 MG Tabs tablet Commonly known as: ELIQUIS  Take 1 tablet (5 mg total) by mouth 2 (two) times daily.   Calcium  Carb-Cholecalciferol  600-800 MG-UNIT Tabs Take 1 tablet by mouth daily.   cetirizine  10 MG tablet Commonly known as: ZYRTEC  TAKE 1 TABLET EVERY DAY   feeding supplement Liqd Take 237 mLs by mouth 2 (two) times daily between meals.   metoprolol  tartrate 25 MG tablet Commonly known as: LOPRESSOR  Take 1 tablet (25 mg total) by mouth 2 (two) times daily.  VITAMIN C PO Take 1 tablet by mouth daily.   Vitamin D  50 MCG (2000 UT) Caps Take 2,000 Units by mouth daily.               Durable Medical Equipment  (From admission, onward)           Start     Ordered   06/23/23 1315  For home use only DME Walker rolling  Once       Comments: Patient unsteady on feet and at risk for falls walking without AD, safer using RW with good return for use demonstrated.  Question Answer Comment  Walker: With 5 Inch Wheels   Patient needs a walker to treat with the following  condition Gait difficulty      06/23/23 1315            Contact information for follow-up providers     Angela Feller, FNP. Schedule an appointment as soon as possible for a visit in 1 week(s).   Specialty: Family Medicine Contact information: [address] [place] [address] [phone]         Angela Rider, MD. Go to.   Specialties: Neurology, Radiology Contact information: [address] [place] [address] [phone]              Contact information for after-discharge care     Destination     [facility] .   Service: Skilled Nursing Contact information: [address] [place]  [address] [phone]                    Allergies  Allergen Reactions   Lipitor [Atorvastatin ] Other (See Comments)    Unable to stand or move   Allopurinol  Diarrhea and Nausea And Vomiting    Pt reported this and colchicine  both caused "a lot of stomach issues"   Colchicine  Diarrhea and Nausea And Vomiting    Pt reported this and allopurinol  both caused "a lot of stomach issues"   Meloxicam  Other (See Comments)    Made patient dizzy and caused her heart to race but can take a half tablet   Statins Other (See Comments)    Leg cramps    Crestor  [Rosuvastatin ] Other (See Comments)    Dizziness   Penicillins Rash    Immediate rash, facial/tongue/throat swelling, SOB or lightheadedness with hypotension    Consultations: Neurology   Procedures/Studies: ECHOCARDIOGRAM COMPLETE Result Date: 06/23/2023    ECHOCARDIOGRAM REPORT   Patient Name:   Angela Dawson Date of Exam: 06/23/2023 Medical Rec #:  [id number]      Height:       60.0 in Accession #:    [id number]     Weight:       200.0 lb Date of Birth:  10/05/1945       BSA:          1.867 m Patient Age:    77 years       BP:           138/79 mmHg Patient Gender: F              HR:           75 bpm. Exam Location:  [facility] Procedure:  2D Echo, Cardiac Doppler and Color Doppler (Both Spectral and Color            Flow Doppler were utilized during procedure). Indications:    Atrial Fibrillation  History:        Patient has prior history of Echocardiogram examinations, most                 recent 05/11/2016. Risk Factors:Hypertension, Dyslipidemia and                 Former Smoker.                 Aortic Valve: 21 mm Edwards bioprosthetic valve is present in                 the aortic position. Procedure Date: 01/27/2016.  Sonographer:    Reta Cassis Referring Phys: [id number] OLADAPO ADEFESO IMPRESSIONS  1. Left ventricular ejection fraction, by estimation, is 65 to 70%. The left ventricle has normal function. The left ventricle has no regional wall motion abnormalities. Left ventricular diastolic parameters are consistent with Grade II diastolic dysfunction (pseudonormalization).  2. Right ventricular systolic function is normal. The right ventricular size is mildly enlarged. There is normal pulmonary artery systolic pressure. The estimated right ventricular systolic pressure is 24.0 mmHg.  3. Left atrial size was mildly dilated.  4. The mitral valve is degenerative. Trivial mitral valve regurgitation. No evidence of mitral stenosis.  5. 21 mm bioprosthetic aortic valve with moderate stenosis. Vmax 3.5 m/s, MG 26 mmHG, EOA 1.04 cm2, DI 0.33. The aortic valve has been repaired/replaced. Aortic valve regurgitation is trivial. There is a 21 mm Edwards bioprosthetic valve present in the aortic position. Procedure Date: 01/27/2016. Echo findings are consistent with stenosis of the aortic prosthesis. Aortic valve area, by VTI measures 1.04 cm. Aortic valve mean gradient measures 26.5 mmHg. Aortic valve Vmax measures 3.53 m/s.  6. The inferior vena cava is normal in size with greater than 50% respiratory variability, suggesting right atrial pressure of 3 mmHg. Comparison(s): Changes from prior study are noted. Prosthetic valve stenosis is now present.  FINDINGS  Left Ventricle: Left ventricular ejection fraction, by estimation, is 65 to 70%. The left ventricle has normal function. The left ventricle has no regional wall motion abnormalities. The left ventricular internal cavity size was normal in size. There is  no left ventricular hypertrophy. Left ventricular diastolic parameters are consistent with Grade II diastolic dysfunction (pseudonormalization). Right Ventricle: The right ventricular size is mildly enlarged. No increase in right ventricular wall thickness. Right ventricular systolic function is normal. There is normal pulmonary artery systolic pressure. The tricuspid regurgitant velocity is 2.29  m/s, and with an assumed right atrial pressure of 3 mmHg, the estimated right ventricular systolic pressure is 24.0 mmHg. Left Atrium: Left atrial size was mildly dilated. Right Atrium: Right atrial size was normal in size. Pericardium: There is no evidence of pericardial effusion. Mitral Valve: The mitral valve is degenerative in appearance. Mild to moderate mitral annular calcification. Trivial mitral valve regurgitation. No evidence of mitral valve stenosis. Tricuspid Valve: The tricuspid valve is grossly normal. Tricuspid valve regurgitation is trivial. No evidence of tricuspid stenosis. Aortic Valve: 21 mm bioprosthetic aortic valve with moderate stenosis. Vmax 3.5 m/s, MG 26 mmHG, EOA 1.04 cm2, DI 0.33. The aortic valve has been repaired/replaced. Aortic valve regurgitation is trivial. Aortic valve mean gradient measures 26.5 mmHg. Aortic valve peak gradient measures 49.8 mmHg. Aortic valve area, by VTI measures 1.04 cm. There is a 21 mm Edwards bioprosthetic valve present in the aortic position. Procedure Date: 01/27/2016. Pulmonic Valve: The pulmonic valve was grossly normal. Pulmonic valve regurgitation is not visualized. No evidence of pulmonic stenosis. Aorta: The  aortic root and ascending aorta are structurally normal, with no evidence of dilitation.  Venous: The inferior vena cava is normal in size with greater than 50% respiratory variability, suggesting right atrial pressure of 3 mmHg. IAS/Shunts: The atrial septum is grossly normal.  LEFT VENTRICLE PLAX 2D LVIDd:         3.80 cm   Diastology LVIDs:         2.90 cm   LV e' medial:    5.66 cm/s LV PW:         1.10 cm   LV E/e' medial:  21.7 LV IVS:        0.80 cm   LV e' lateral:   9.14 cm/s LVOT diam:     2.00 cm   LV E/e' lateral: 13.5 LV SV:         75 LV SV Index:   40 LVOT Area:     3.14 cm  RIGHT VENTRICLE            IVC RV Basal diam:  4.40 cm    IVC diam: 1.90 cm RV S prime:     8.05 cm/s TAPSE (M-mode): 1.6 cm LEFT ATRIUM             Index        RIGHT ATRIUM           Index LA diam:        4.30 cm 2.30 cm/m   RA Area:     27.00 cm LA Vol (A2C):   87.0 ml 46.61 ml/m  RA Volume:   97.30 ml  52.13 ml/m LA Vol (A4C):   50.7 ml 27.16 ml/m LA Biplane Vol: 66.1 ml 35.41 ml/m  AORTIC VALVE AV Area (Vmax):    0.93 cm AV Area (Vmean):   1.01 cm AV Area (VTI):     1.04 cm AV Vmax:           353.00 cm/s AV Vmean:          233.000 cm/s AV VTI:            0.728 m AV Peak Grad:      49.8 mmHg AV Mean Grad:      26.5 mmHg LVOT Vmax:         105.00 cm/s LVOT Vmean:        74.600 cm/s LVOT VTI:          0.240 m LVOT/AV VTI ratio: 0.33  AORTA Ao Root diam: 2.80 cm Ao Asc diam:  2.80 cm MITRAL VALVE                TRICUSPID VALVE MV Area (PHT): 3.27 cm     TR Peak grad:   21.0 mmHg MV Decel Time: 232 msec     TR Vmax:        229.00 cm/s MV E velocity: 123.00 cm/s MV A velocity: 75.00 cm/s   SHUNTS MV E/A ratio:  1.64         Systemic VTI:  0.24 m                             Systemic Diam: 2.00 cm Jackquelyn Mass MD Electronically signed by Jackquelyn Mass MD Signature Date/Time: 06/23/2023/12:46:07 PM    Final    US  Carotid Bilateral Result Date: 06/23/2023 CLINICAL DATA:  Cerebral infarction, hypertension and hyperlipidemia. EXAM: BILATERAL CAROTID DUPLEX ULTRASOUND TECHNIQUE: Martina Sledge scale imaging, color Doppler  and duplex ultrasound were performed of bilateral carotid and vertebral arteries in the neck. COMPARISON:  None Available. FINDINGS: Criteria: Quantification of carotid stenosis is based on velocity parameters that correlate the residual internal carotid diameter with NASCET-based stenosis levels, using the diameter of the distal internal carotid lumen as the denominator for stenosis measurement. The following velocity measurements were obtained: RIGHT ICA:  94/8 cm/sec CCA:  73/10 cm/sec SYSTOLIC ICA/CCA RATIO:  1.3 ECA:  86 cm/sec LEFT ICA:  75/21 cm/sec CCA:  80/13 cm/sec SYSTOLIC ICA/CCA RATIO:  0.9 ECA:  78 cm/sec RIGHT CAROTID ARTERY: Moderate calcified plaque at the level of the carotid bulb and proximal right ICA. Estimated right ICA stenosis is less than 50%. RIGHT VERTEBRAL ARTERY: Antegrade flow with normal waveform and velocity. LEFT CAROTID ARTERY: Mild calcified plaque at the level of the left carotid bulb and left ICA origin. Estimated left ICA stenosis is less than 50%. LEFT VERTEBRAL ARTERY: Antegrade flow with normal waveform and velocity. IMPRESSION: Calcified plaque at the level of both carotid bulbs and proximal internal carotid arteries, right greater than left. Estimated bilateral ICA stenoses are less than 50%. Electronically Signed   By: Erica Hau [name]   On: 06/23/2023 12:17   MR BRAIN WO CONTRAST Result Date: 06/23/2023 CLINICAL DATA:  78 year old female neurologic deficit, presentation yesterday. EXAM: MRI HEAD WITHOUT CONTRAST TECHNIQUE: Multiplanar, multiecho pulse sequences of the brain and surrounding structures were obtained without intravenous contrast. COMPARISON:  CT head and CTA head and neck yesterday. FINDINGS: Brain: Scattered small foci of abnormal diffusion in the anterior and posterior circulation. Approximately 4 foci scattered in the right cerebral hemisphere from the posterior MCA to the right PCA territories. Solitary focus in the posterior left temporal lobe.  Punctate focus in the central pons (series 5, image 15 and series 7, image 18) and patchy abnormal diffusion in the left lateral medulla (series 5, image 11). These appear isointense to perhaps mildly restricted on ADC. Underlying confluent bilateral cerebral white matter T2 and FLAIR hyperintensity plus scattered small areas of bilateral white matter cystic encephalomalacia. Chronic lacunar infarcts in the bilateral deep gray nuclei. Small chronic infarcts in the bilateral cerebellum. Chronic microhemorrhage in the left insula. Chronic microhemorrhages also in the left middle frontal gyrus, right occipital lobe. No midline shift, mass effect, evidence of mass lesion, ventriculomegaly, extra-axial collection or acute intracranial hemorrhage. Negative pituitary. Vascular: Major intracranial vascular flow voids are preserved. Skull and upper cervical spine: Negative for age visible cervical spine. Visualized bone marrow signal is within normal limits. Sinuses/Orbits: Negative. Other: Mastoids are clear. Visible internal auditory structures appear normal. Negative visible scalp and face. IMPRESSION: 1. Positive for a Recent Embolic Event, with scattered small subacute infarcts in the anterior and posterior circulation. Notable involvement of the left lateral medulla, the central pons. No associated hemorrhage or mass effect. 2. Underlying fairly severe chronic small vessel disease, including a small number of chronic microhemorrhages. Electronically Signed   By: Marlise Simpers [name]   On: 06/23/2023 08:43   CT ANGIO HEAD NECK W WO CM Result Date: 06/22/2023 CLINICAL DATA:  Neuro deficit, acute, stroke suspected left sided facial numbness EXAM: CT ANGIOGRAPHY HEAD AND NECK WITH AND WITHOUT CONTRAST TECHNIQUE: Multidetector CT imaging of the head and neck was performed using the standard protocol during bolus administration of intravenous contrast. Multiplanar CT image reconstructions and MIPs were obtained to evaluate the  vascular anatomy. Carotid stenosis measurements (when applicable) are obtained utilizing NASCET criteria, using the distal internal carotid  diameter as the denominator. RADIATION DOSE REDUCTION: This exam was performed according to the departmental dose-optimization program which includes automated exposure control, adjustment of the mA and/or kV according to patient size and/or use of iterative reconstruction technique. CONTRAST:  75mL OMNIPAQUE  IOHEXOL  350 MG/ML SOLN COMPARISON:  None Available. FINDINGS: CT HEAD FINDINGS Brain: No evidence of acute infarction, hemorrhage, hydrocephalus, extra-axial collection or mass lesion/mass effect. Remote right basal ganglia perforator infarct. Remote left cerebellar infarct. Patchy white matter hypodensities, compatible with chronic microvascular disease. Vascular: See below. Skull: No acute fracture. Sinuses/Orbits: No acute finding. Other: Clear sinuses.  No acute orbital findings. Review of the MIP images confirms the above findings CTA NECK FINDINGS Aortic arch: Great vessel origins are pain without significant stenosis. Aortic atherosclerosis. Right carotid system: No evidence of dissection, stenosis (50% or greater), or occlusion. Left carotid system: No evidence of dissection, stenosis (50% or greater), or occlusion. Vertebral arteries: Right dominant. No evidence of dissection, stenosis (50% or greater), or occlusion. Skeleton: No acute fracture on limited assessment. Other neck: No acute abnormality on limited assessment. Upper chest: Visualized lung apices are clear. Review of the MIP images confirms the above findings CTA HEAD FINDINGS Anterior circulation: Bilateral intracranial ICAs, MCAs, and ACAs are patent without proximal hemodynamically significant stenosis. Posterior circulation: Bilateral intradural vertebral arteries, basilar artery and bilateral posterior cerebral arteries are patent without proximal hemodynamically significant stenosis. Venous  sinuses: As permitted by contrast timing, patent. Review of the MIP images confirms the above findings IMPRESSION: 1. No emergent large vessel occlusion or proximal hemodynamically significant stenosis. 2. 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 3. Aortic Atherosclerosis (ICD10-I70.0). Electronically Signed   By: Stevenson Elbe [name]   On: 06/22/2023 22:27     Discharge Exam: Vitals:   06/27/23 0900 06/27/23 0913  BP: (!) 134/59 (!) 134/59  Pulse: 63 63  Resp: 18   Temp:    SpO2: 98%    Vitals:   06/26/23 2131 06/27/23 0342 06/27/23 0900 06/27/23 0913  BP: 139/60 (!) 150/79 (!) 134/59 (!) 134/59  Pulse: 69 (!) 52 63 63  Resp:  17 18   Temp:  97.9 F (36.6 C)    TempSrc:  Oral    SpO2:  97% 98%   Weight:      Height:        General: Pt is alert, awake, not in acute distress Cardiovascular: RRR, S1/S2 +, no rubs, no gallops Respiratory: CTA bilaterally, no wheezing, no rhonchi Abdominal: Soft, NT, ND, bowel sounds + Extremities: no edema, no cyanosis    The results of significant diagnostics from this hospitalization (including imaging, microbiology, ancillary and laboratory) are listed below for reference.     Microbiology: Recent Results (from the past 240 hours)  Resp panel by RT-PCR (RSV, Flu A&B, Covid) Anterior Nasal Swab     Status: None   Collection Time: 06/22/23  7:56 PM   Specimen: Anterior Nasal Swab  Result Value Ref Range Status   SARS Coronavirus 2 by RT PCR NEGATIVE NEGATIVE Final    Comment: (NOTE) SARS-CoV-2 target nucleic acids are NOT DETECTED.  The SARS-CoV-2 RNA is generally detectable in upper respiratory specimens during the acute phase of infection. The lowest concentration of SARS-CoV-2 viral copies this assay can detect is 138 copies/mL. A negative result does not preclude SARS-Cov-2 infection and should not be used as the sole basis for treatment or other patient management decisions. A negative result may occur with   improper specimen collection/handling, submission  of specimen other than nasopharyngeal swab, presence of viral mutation(s) within the areas targeted by this assay, and inadequate number of viral copies(<138 copies/mL). A negative result must be combined with clinical observations, patient history, and epidemiological information. The expected result is Negative.  Fact Sheet for Patients:  BloggerCourse.com  Fact Sheet for Healthcare Providers:  SeriousBroker.it  This test is no t yet approved or cleared by the [place]  FDA and  has been authorized for detection and/or diagnosis of SARS-CoV-2 by FDA under an Emergency Use Authorization (EUA). This EUA will remain  in effect (meaning this test can be used) for the duration of the COVID-19 declaration under Section 564(b)(1) of the Act, 21 U.S.C.section 360bbb-3(b)(1), unless the authorization is terminated  or revoked sooner.       Influenza A by PCR NEGATIVE NEGATIVE Final   Influenza B by PCR NEGATIVE NEGATIVE Final    Comment: (NOTE) The Xpert Xpress SARS-CoV-2/FLU/RSV plus assay is intended as an aid in the diagnosis of influenza from Nasopharyngeal swab specimens and should not be used as a sole basis for treatment. Nasal washings and aspirates are unacceptable for Xpert Xpress SARS-CoV-2/FLU/RSV testing.  Fact Sheet for Patients: BloggerCourse.com  Fact Sheet for Healthcare Providers: SeriousBroker.it  This test is not yet approved or cleared by the [place]  FDA and has been authorized for detection and/or diagnosis of SARS-CoV-2 by FDA under an Emergency Use Authorization (EUA). This EUA will remain in effect (meaning this test can be used) for the duration of the COVID-19 declaration under Section 564(b)(1) of the Act, 21 U.S.C. section 360bbb-3(b)(1), unless the authorization is terminated or revoked.      Resp Syncytial Virus by PCR NEGATIVE NEGATIVE Final    Comment: (NOTE) Fact Sheet for Patients: BloggerCourse.com  Fact Sheet for Healthcare Providers: SeriousBroker.it  This test is not yet approved or cleared by the [place]  FDA and has been authorized for detection and/or diagnosis of SARS-CoV-2 by FDA under an Emergency Use Authorization (EUA). This EUA will remain in effect (meaning this test can be used) for the duration of the COVID-19 declaration under Section 564(b)(1) of the Act, 21 U.S.C. section 360bbb-3(b)(1), unless the authorization is terminated or revoked.  Performed at [facility], [address] [place], [place] [address]      Labs: BNP (last 3 results) No results for input(s): "BNP" in the last 8760 hours. Basic Metabolic Panel: Recent Labs  Lab 06/22/23 1921 06/23/23 0428  NA 141 140  K 3.5 3.2*  CL 101 104  CO2 27 26  GLUCOSE 108* 98  BUN 10 10  CREATININE 0.91 0.90  CALCIUM  9.3 8.9  MG 1.8 1.8  PHOS  --  3.7   Liver Function Tests: Recent Labs  Lab 06/23/23 0428  AST 16  ALT 14  ALKPHOS 64  BILITOT 0.4  PROT 5.9*  ALBUMIN  3.0*   No results for input(s): "LIPASE", "AMYLASE" in the last 168 hours. No results for input(s): "AMMONIA" in the last 168 hours. CBC: Recent Labs  Lab 06/22/23 1921 06/23/23 0428  WBC 9.1 8.7  NEUTROABS 4.9  --   HGB 15.3* 14.0  HCT 44.6 42.5  MCV 92.5 94.0  PLT 212 200   Cardiac Enzymes: No results for input(s): "CKTOTAL", "CKMB", "CKMBINDEX", "TROPONINI" in the last 168 hours. BNP: Invalid input(s): "POCBNP" CBG: No results for input(s): "GLUCAP" in the last 168 hours. D-Dimer No results for input(s): "DDIMER" in the last 72 hours. Hgb A1c No results for input(s): "HGBA1C"  in the last 72 hours. Lipid Profile No results for input(s): "CHOL", "HDL", "LDLCALC", "TRIG", "CHOLHDL", "LDLDIRECT" in the last 72 hours. Thyroid  function studies No  results for input(s): "TSH", "T4TOTAL", "T3FREE", "THYROIDAB" in the last 72 hours.  Invalid input(s): "FREET3" Anemia work up No results for input(s): "VITAMINB12", "FOLATE", "FERRITIN", "TIBC", "IRON", "RETICCTPCT" in the last 72 hours. Urinalysis    Component Value Date/Time   COLORURINE YELLOW 01/14/2016 0842   APPEARANCEUR CLEAR 01/14/2016 0842   LABSPEC 1.023 01/14/2016 0842   PHURINE 5.0 01/14/2016 0842   GLUCOSEU NEGATIVE 01/14/2016 0842   HGBUR TRACE (A) 01/14/2016 0842   BILIRUBINUR NEGATIVE 01/14/2016 0842   KETONESUR NEGATIVE 01/14/2016 0842   PROTEINUR NEGATIVE 01/14/2016 0842   NITRITE NEGATIVE 01/14/2016 0842   LEUKOCYTESUR SMALL (A) 01/14/2016 0842   Sepsis Labs Recent Labs  Lab 06/22/23 1921 06/23/23 0428  WBC 9.1 8.7   Microbiology Recent Results (from the past 240 hours)  Resp panel by RT-PCR (RSV, Flu A&B, Covid) Anterior Nasal Swab     Status: None   Collection Time: 06/22/23  7:56 PM   Specimen: Anterior Nasal Swab  Result Value Ref Range Status   SARS Coronavirus 2 by RT PCR NEGATIVE NEGATIVE Final    Comment: (NOTE) SARS-CoV-2 target nucleic acids are NOT DETECTED.  The SARS-CoV-2 RNA is generally detectable in upper respiratory specimens during the acute phase of infection. The lowest concentration of SARS-CoV-2 viral copies this assay can detect is 138 copies/mL. A negative result does not preclude SARS-Cov-2 infection and should not be used as the sole basis for treatment or other patient management decisions. A negative result may occur with  improper specimen collection/handling, submission of specimen other than nasopharyngeal swab, presence of viral mutation(s) within the areas targeted by this assay, and inadequate number of viral copies(<138 copies/mL). A negative result must be combined with clinical observations, patient history, and epidemiological information. The expected result is Negative.  Fact Sheet for Patients:   BloggerCourse.com  Fact Sheet for Healthcare Providers:  SeriousBroker.it  This test is no t yet approved or cleared by the [place]  FDA and  has been authorized for detection and/or diagnosis of SARS-CoV-2 by FDA under an Emergency Use Authorization (EUA). This EUA will remain  in effect (meaning this test can be used) for the duration of the COVID-19 declaration under Section 564(b)(1) of the Act, 21 U.S.C.section 360bbb-3(b)(1), unless the authorization is terminated  or revoked sooner.       Influenza A by PCR NEGATIVE NEGATIVE Final   Influenza B by PCR NEGATIVE NEGATIVE Final    Comment: (NOTE) The Xpert Xpress SARS-CoV-2/FLU/RSV plus assay is intended as an aid in the diagnosis of influenza from Nasopharyngeal swab specimens and should not be used as a sole basis for treatment. Nasal washings and aspirates are unacceptable for Xpert Xpress SARS-CoV-2/FLU/RSV testing.  Fact Sheet for Patients: BloggerCourse.com  Fact Sheet for Healthcare Providers: SeriousBroker.it  This test is not yet approved or cleared by the [place]  FDA and has been authorized for detection and/or diagnosis of SARS-CoV-2 by FDA under an Emergency Use Authorization (EUA). This EUA will remain in effect (meaning this test can be used) for the duration of the COVID-19 declaration under Section 564(b)(1) of the Act, 21 U.S.C. section 360bbb-3(b)(1), unless the authorization is terminated or revoked.     Resp Syncytial Virus by PCR NEGATIVE NEGATIVE Final    Comment: (NOTE) Fact Sheet for Patients: BloggerCourse.com  Fact Sheet for Healthcare Providers: SeriousBroker.it  This test is not yet approved or cleared by the [place]  FDA and has been authorized for detection and/or diagnosis of SARS-CoV-2 by FDA under an Emergency Use  Authorization (EUA). This EUA will remain in effect (meaning this test can be used) for the duration of the COVID-19 declaration under Section 564(b)(1) of the Act, 21 U.S.C. section 360bbb-3(b)(1), unless the authorization is terminated or revoked.  Performed at [facility], [address], [place], [place] [address]      Time coordinating discharge: 35 minutes  SIGNED:   Cornelius Dill, DO [facility] 06/27/2023, 1:09 PM  If 7PM-7AM, please contact night-coverage [email or web address]

## 2023-06-27 NOTE — Plan of Care (Signed)
  Problem: Activity: Goal: Risk for activity intolerance will decrease Outcome: Adequate for Discharge   Problem: Pain Managment: Goal: General experience of comfort will improve and/or be controlled Outcome: Adequate for Discharge   Problem: Education: Goal: Knowledge of disease or condition will improve Outcome: Adequate for Discharge Goal: Knowledge of secondary prevention will improve (MUST DOCUMENT ALL) Outcome: Adequate for Discharge Goal: Knowledge of patient specific risk factors will improve (DELETE if not current risk factor) Outcome: Adequate for Discharge   Problem: Ischemic Stroke/TIA Tissue Perfusion: Goal: Complications of ischemic stroke/TIA will be minimized Outcome: Adequate for Discharge   Problem: Coping: Goal: Will verbalize positive feelings about self Outcome: Adequate for Discharge Goal: Will identify appropriate support needs Outcome: Adequate for Discharge   Problem: Health Behavior/Discharge Planning: Goal: Ability to manage health-related needs will improve Outcome: Adequate for Discharge Goal: Goals will be collaboratively established with patient/family Outcome: Adequate for Discharge   Problem: Self-Care: Goal: Ability to participate in self-care as condition permits will improve Outcome: Adequate for Discharge Goal: Verbalization of feelings and concerns over difficulty with self-care will improve Outcome: Adequate for Discharge Goal: Ability to communicate needs accurately will improve Outcome: Adequate for Discharge   Problem: Nutrition: Goal: Risk of aspiration will decrease Outcome: Adequate for Discharge Goal: Dietary intake will improve Outcome: Adequate for Discharge

## 2023-06-27 NOTE — Plan of Care (Signed)
  Problem: Activity: Goal: Risk for activity intolerance will decrease Outcome: Progressing   Problem: Pain Managment: Goal: General experience of comfort will improve and/or be controlled Outcome: Progressing   Problem: Education: Goal: Knowledge of disease or condition will improve Outcome: Progressing Goal: Knowledge of secondary prevention will improve (MUST DOCUMENT ALL) Outcome: Progressing Goal: Knowledge of patient specific risk factors will improve (DELETE if not current risk factor) Outcome: Progressing   Problem: Ischemic Stroke/TIA Tissue Perfusion: Goal: Complications of ischemic stroke/TIA will be minimized Outcome: Progressing   Problem: Coping: Goal: Will verbalize positive feelings about self Outcome: Progressing Goal: Will identify appropriate support needs Outcome: Progressing   Problem: Health Behavior/Discharge Planning: Goal: Ability to manage health-related needs will improve Outcome: Progressing Goal: Goals will be collaboratively established with patient/family Outcome: Progressing   Problem: Self-Care: Goal: Ability to participate in self-care as condition permits will improve Outcome: Progressing Goal: Verbalization of feelings and concerns over difficulty with self-care will improve Outcome: Progressing Goal: Ability to communicate needs accurately will improve Outcome: Progressing   Problem: Nutrition: Goal: Risk of aspiration will decrease Outcome: Progressing Goal: Dietary intake will improve Outcome: Progressing

## 2023-06-27 NOTE — TOC Transition Note (Signed)
 Transition of Care [facility]) - Discharge Note   Patient Details  Name: Angela Dawson MRN: [id number] Date of Birth: 1945/10/25  [facility] of [facility]) CM/SW Contact:  Ander Katos, LCSW Phone Number: 06/27/2023, 1:12 PM   Clinical Narrative:  Pt d/c today to [facility]. SNF authorization received. Pt aware and agreeable. D/C summary sent to SNF. Pt will transfer with staff. RN given number to call report.      Final next level of care: Skilled Nursing Facility Barriers to Discharge: Barriers Resolved   Patient Goals and CMS Choice Patient states their goals for this hospitalization and ongoing recovery are:: SNF   Choice offered to / list presented to : Patient [place] ownership interest in [facility].provided to:: Patient    Discharge Placement              Patient chooses bed at: [facility] Patient to be transferred to facility by: facility   Patient and family notified of of transfer: 06/27/23  Discharge Plan and Services Additional resources added to the After Visit Summary for                                       Social Drivers of Health (SDOH) Interventions SDOH Screenings   Food Insecurity: No Food Insecurity (06/23/2023)  Housing: Low Risk  (06/23/2023)  Transportation Needs: No Transportation Needs (06/23/2023)  Utilities: Not At Risk (06/23/2023)  Alcohol Screen: Low Risk  (11/08/2021)  Depression (PHQ2-9): Low Risk  (02/05/2023)  Financial Resource Strain: Low Risk  (11/08/2021)  Physical Activity: Insufficiently Active (11/08/2021)  Social Connections: Socially Isolated (06/23/2023)  Stress: No Stress Concern Present (11/08/2021)  Tobacco Use: Medium Risk (06/22/2023)     Readmission Risk Interventions     No data to display

## 2023-06-28 ENCOUNTER — Encounter: Payer: Self-pay | Admitting: Adult Health

## 2023-06-28 ENCOUNTER — Non-Acute Institutional Stay (SKILLED_NURSING_FACILITY): Payer: Self-pay | Admitting: Adult Health

## 2023-06-28 DIAGNOSIS — I700 Atherosclerosis of aorta: Secondary | ICD-10-CM | POA: Insufficient documentation

## 2023-06-28 DIAGNOSIS — Z8673 Personal history of transient ischemic attack (TIA), and cerebral infarction without residual deficits: Secondary | ICD-10-CM | POA: Insufficient documentation

## 2023-06-28 DIAGNOSIS — I77810 Thoracic aortic ectasia: Secondary | ICD-10-CM

## 2023-06-28 DIAGNOSIS — I4891 Unspecified atrial fibrillation: Secondary | ICD-10-CM

## 2023-06-28 DIAGNOSIS — E876 Hypokalemia: Secondary | ICD-10-CM | POA: Diagnosis not present

## 2023-06-28 DIAGNOSIS — E039 Hypothyroidism, unspecified: Secondary | ICD-10-CM | POA: Diagnosis not present

## 2023-06-28 DIAGNOSIS — J3089 Other allergic rhinitis: Secondary | ICD-10-CM | POA: Insufficient documentation

## 2023-06-28 DIAGNOSIS — I10 Essential (primary) hypertension: Secondary | ICD-10-CM | POA: Diagnosis not present

## 2023-06-28 NOTE — Progress Notes (Signed)
 Location:  [facility] Room Number: 108 Place of Service:  SNF (31)   CODE STATUS: full   Allergies  Allergen Reactions   Lipitor [Atorvastatin ] Other (See Comments)    Unable to stand or move   Allopurinol  Diarrhea and Nausea And Vomiting    Pt reported this and colchicine  both caused "a lot of stomach issues"   Colchicine  Diarrhea and Nausea And Vomiting    Pt reported this and allopurinol  both caused "a lot of stomach issues"   Meloxicam  Other (See Comments)    Made patient dizzy and caused her heart to race but can take a half tablet   Statins Other (See Comments)    Leg cramps    Crestor  [Rosuvastatin ] Other (See Comments)    Dizziness   Penicillins Rash    Immediate rash, facial/tongue/throat swelling, SOB or lightheadedness with hypotension    Chief Complaint  Patient presents with   Hospitalization Follow-up    HPI:  She is a 78 year old woman who has been hospitalized from 06-22-23 through 06-27-23. Her past medication history includes: hypertension; obesity; CVA. She presented to the ED due to a 5 day onset of perioral tingling, fall and imbalance. She was found to have an acute CVA. She did have a neurology consult who recommended to continue eliquis . She is here for short term rehab with her goal to return back home. Will continue to be followed for her chronic illnesses including:  Essential hypertension:   Aortic atherosclerosis/ascending aorta dilatation : Chronic allergic rhinitis due to pollen:   Past Medical History:  Diagnosis Date   Allergy    seasonal   Chicken pox    Complication of anesthesia    difficult to wake up after tubal ligations   Dyspnea    with exertion   Heart murmur    History of bronchitis    History of kidney stones    "only one"   Hypertension    Measles     Past Surgical History:  Procedure Laterality Date   AORTIC VALVE REPLACEMENT N/A 01/17/2016   Procedure: AORTIC VALVE REPLACEMENT (AVR) USING  EDWARDS MAGNA EASE PERICARDIAL BIOPROSTHESIS VALVE;  Surgeon: Norita Beauvais, MD;  Location: [facility] OR;  Service: Open Heart Surgery;  Laterality: N/A;   CARDIAC CATHETERIZATION N/A 09/24/2015   Procedure: Right/Left Heart Cath and Coronary Angiography;  Surgeon: Lucendia Rusk, MD;  Location: [facility] INVASIVE CV LAB;  Service: Cardiovascular;  Laterality: N/A;   COLONOSCOPY     REPLACEMENT ASCENDING AORTA N/A 01/17/2016   Procedure: REPLACEMENT ASCENDING AORTA USING HEMASHIELD PLATINUM WOVEN DOUBLE VELOUR VASCULAR GRAFT;  Surgeon: Norita Beauvais, MD;  Location: [facility] OR;  Service: Open Heart Surgery;  Laterality: N/A;   TEE WITHOUT CARDIOVERSION N/A 01/17/2016   Procedure: TRANSESOPHAGEAL ECHOCARDIOGRAM (TEE);  Surgeon: Norita Beauvais, MD;  Location: [facility] OR;  Service: Open Heart Surgery;  Laterality: N/A;   TUBAL LIGATION      Social History   Socioeconomic History   Marital status: Widowed    Spouse name: Not on file   Number of children: 1   Years of education: business school   Highest education level: Bachelor's degree (e.g., BA, AB, BS)  Occupational History   Occupation: retired    Associate Professor: [employer]   Occupation: Retired    Comment: Biochemist, clinical  Tobacco Use   Smoking status: Former    Current packs/day: 0.00    Average packs/day: 0.5 packs/day for 5.2  years (2.6 ttl pk-yrs)    Types: Cigarettes    Start date: 08/04/1968    Quit date: 10/10/1973    Years since quitting: 49.7    Passive exposure: Current   Smokeless tobacco: Never   Tobacco comments:    smoked less than 1 pack every 2 weeks. Havent smoked in atleast 40 years  Vaping Use   Vaping status: Never Used  Substance and Sexual Activity   Alcohol use: Not Currently    Comment: seldom   Drug use: No   Sexual activity: Not Currently  Other Topics Concern   Not on file  Social History Narrative   Lives alone. Pt states son lives in [place].    Social Drivers of Corporate investment banker  Strain: Low Risk  (11/08/2021)   Overall Financial Resource Strain (CARDIA)    Difficulty of Paying Living Expenses: Not hard at all  Food Insecurity: No Food Insecurity (06/23/2023)   Hunger Vital Sign    Worried About Running Out of Food in the Last Year: Never true    Ran Out of Food in the Last Year: Never true  Transportation Needs: No Transportation Needs (06/23/2023)   PRAPARE - Administrator, Civil Service (Medical): No    Lack of Transportation (Non-Medical): No  Physical Activity: Insufficiently Active (11/08/2021)   Exercise Vital Sign    Days of Exercise per Week: 7 days    Minutes of Exercise per Session: 20 min  Stress: No Stress Concern Present (11/08/2021)   [employer] of Occupational Health - Occupational Stress Questionnaire    Feeling of Stress : Not at all  Social Connections: Socially Isolated (06/23/2023)   Social Connection and Isolation Panel [NHANES]    Frequency of Communication with Friends and Family: More than three times a week    Frequency of Social Gatherings with Friends and Family: More than three times a week    Attends Religious Services: Never    Database administrator or Organizations: No    Attends Banker Meetings: Never    Marital Status: Widowed  Intimate Partner Violence: Not At Risk (06/23/2023)   Humiliation, Afraid, Rape, and Kick questionnaire    Fear of Current or Ex-Partner: No    Emotionally Abused: No    Physically Abused: No    Sexually Abused: No   Family History  Problem Relation Age of Onset   Alzheimer's disease Mother    Heart disease Mother    Heart attack Father 51   Post-traumatic stress disorder Brother    Breast cancer Maternal Aunt    Heart attack Son    Other Son        5 back surgeries      VITAL SIGNS BP 114/64   Pulse 68   Temp (!) 97.2 F (36.2 C)   Resp 18   Ht 5' (1.524 m)   Wt 205 lb 1.6 oz (93 kg)   SpO2 96%   BMI 40.06 kg/m   Outpatient Encounter Medications as of  06/28/2023  Medication Sig   loratadine  (CLARITIN ) 10 MG tablet Take 10 mg by mouth daily.   acetaminophen  (TYLENOL ) 325 MG tablet Take 2 tablets (650 mg total) by mouth every 6 (six) hours as needed for mild pain.   apixaban  (ELIQUIS ) 5 MG TABS tablet Take 1 tablet (5 mg total) by mouth 2 (two) times daily.   Ascorbic Acid (VITAMIN C PO) Take 1 tablet by mouth daily. 500 mg  Calcium  Carb-Cholecalciferol  600-800 MG-UNIT TABS Take 1 tablet by mouth daily.   Cholecalciferol  (VITAMIN D ) 2000 units CAPS Take 2,000 Units by mouth daily.   feeding supplement (ENSURE ENLIVE / ENSURE PLUS) LIQD Take 237 mLs by mouth 2 (two) times daily between meals.   metoprolol  tartrate (LOPRESSOR ) 25 MG tablet Take 1 tablet (25 mg total) by mouth 2 (two) times daily.   [DISCONTINUED] cetirizine  (ZYRTEC ) 10 MG tablet TAKE 1 TABLET EVERY DAY   No facility-administered encounter medications on file as of 06/28/2023.     SIGNIFICANT DIAGNOSTIC EXAMS  TODAY  06-22-23: wbc 9.1; hgb 15.3; hct 44.6; mcv 92.5 plt 212; glucose 108; bun 10; creat 0.91; k+ 3.5; na++ 141; ca 9.3 gfr >60; mag 1.8 06-23-23; wbc 8.7; hgb 14.0; hct 42.5 mcv 94.0 plt 200; glucose 98; bun 10; creat 0.90; k+ 3.2; na++ 140; ca 8.9; gfr >60 mag 1.8; vitamin B12: 108; tsh 5.027 free t4: 0.86; free t3: 2.6; hgb A1c 5.3; chol 169; ldl 106; trig 114; hdl 40   Review of Systems  Constitutional:  Negative for malaise/fatigue.  Respiratory:  Negative for cough and shortness of breath.   Cardiovascular:  Negative for chest pain, palpitations and leg swelling.  Gastrointestinal:  Negative for abdominal pain, constipation and heartburn.  Musculoskeletal:  Negative for back pain, joint pain and myalgias.  Skin: Negative.   Neurological:  Negative for dizziness.  Psychiatric/Behavioral:  The patient is not nervous/anxious.    Physical Exam Constitutional:      General: She is not in acute distress.    Appearance: She is well-developed. She is morbidly  obese. She is not diaphoretic.  Neck:     Thyroid : No thyromegaly.  Cardiovascular:     Rate and Rhythm: Normal rate and regular rhythm.     Pulses: Normal pulses.     Heart sounds: Murmur heard.     Comments: HX: tissue AVR  Pulmonary:     Effort: Pulmonary effort is normal. No respiratory distress.     Breath sounds: Normal breath sounds.  Abdominal:     General: Bowel sounds are normal. There is no distension.     Palpations: Abdomen is soft.     Tenderness: There is no abdominal tenderness.  Musculoskeletal:     Cervical back: Neck supple.     Right lower leg: No edema.     Left lower leg: No edema.     Comments: Left side weakness present   Lymphadenopathy:     Cervical: No cervical adenopathy.  Skin:    General: Skin is warm and dry.  Neurological:     Mental Status: She is alert. Mental status is at baseline.  Psychiatric:        Mood and Affect: Mood normal.       ASSESSMENT/ PLAN:  TODAY  New onset atrial fibrillation: heart rate is stable: will continue lopressor  25 mg twice daily for rate control. Will continue eliquis  5 mg twice daily   2. History of CVA: has left side weakness present; will continue eliquis  5 mg twice daily   3. Essential hypertension: b/p 114/64: will monitor   4. Aortic atherosclerosis/ascending aorta dilatation : (01-14-22 ct) not on statin due to intolerance  5. Chronic allergic rhinitis due to pollen: will continue claritin  10 mg daily   6. Severe obesity (BMI>40) will continue to monitor her status   7. Hypokalemia: k+ 3.2 will repeat labs  8. Acquired hypothyroidism: tsh 5.027 has normal free hormones will continue to  monitor   9. Mild protein calorie malnutrition: albumin  3.0; will continue supplements as directed.   Will repeat BMP    Britt Candle NP [facility]   call [phone]

## 2023-06-29 ENCOUNTER — Encounter: Payer: Self-pay | Admitting: Internal Medicine

## 2023-06-29 ENCOUNTER — Non-Acute Institutional Stay (SKILLED_NURSING_FACILITY): Payer: Self-pay | Admitting: Internal Medicine

## 2023-06-29 DIAGNOSIS — E782 Mixed hyperlipidemia: Secondary | ICD-10-CM

## 2023-06-29 DIAGNOSIS — M1A4710 Other secondary chronic gout, right ankle and foot, without tophus (tophi): Secondary | ICD-10-CM

## 2023-06-29 DIAGNOSIS — I4891 Unspecified atrial fibrillation: Secondary | ICD-10-CM

## 2023-06-29 DIAGNOSIS — E039 Hypothyroidism, unspecified: Secondary | ICD-10-CM | POA: Diagnosis not present

## 2023-06-29 DIAGNOSIS — E538 Deficiency of other specified B group vitamins: Secondary | ICD-10-CM

## 2023-06-29 DIAGNOSIS — R29818 Other symptoms and signs involving the nervous system: Secondary | ICD-10-CM | POA: Diagnosis not present

## 2023-06-29 DIAGNOSIS — Z8673 Personal history of transient ischemic attack (TIA), and cerebral infarction without residual deficits: Secondary | ICD-10-CM

## 2023-06-29 DIAGNOSIS — E441 Mild protein-calorie malnutrition: Secondary | ICD-10-CM | POA: Diagnosis not present

## 2023-06-29 DIAGNOSIS — R4189 Other symptoms and signs involving cognitive functions and awareness: Secondary | ICD-10-CM | POA: Diagnosis not present

## 2023-06-29 NOTE — Patient Instructions (Addendum)
 See assessment and plan under each diagnosis in the problem list and acutely for this visit:  Hyperlipidemia LDL 106 and HDL 40.  She has a history of intolerance to both lipophilic and hydrophilic statins.  I shall discuss with her checking the CK and then reinitiating low-dose rosuvastatin  3 times a week with subsequent lipid check in 6-8 weeks.  Her LDL goal is less than 70.  New onset atrial fibrillation (HCC) Rhythm is slightly irregular; rate is well-controlled.  Continue Eliquis  prophylaxis.  B12 deficiency B12 level was 108 without associated anemia or macrocytosis.  Recheck B12 level in 8-12 weeks to verify adequate absorption.  History of CVA (cerebrovascular accident) Continue Eliquis  prophylaxis and monitor for any bleeding dyscrasias.  She states statins caused "numbness all over." I discussed risk of LDL greater than 70. She agrees to trial of Crestor  5 mg M,W, & F with repeat lipids & CK in 8 weeks.  Acquired hypothyroidism TSH is slightly elevated at 5.027; goal would be 1-4.  Recheck TSH in 8 to 12 weeks.  Unspecified protein-calorie malnutrition (HCC) Protein supplementation at the SNF.  Neurocognitive deficits The BCAT is a licensed MMSE assessment tool employed by [employer] [facility] [employer]. Scoring:46-50 ... Normal. 34-45.Aaron AasAaron AasMild Cognitive Impairment. 26-33.Aaron AasAaron AasMild Dementia. 0-25.. Moderate-Severe Dementia. Speech Therapy at [facility] performed this MMSE.  Her score was 33 out of 50 suggesting mild dementia. Imaging suggest probable vascular dementia as etiology.  Gout of right foot I discussed the role of high fructose corn syrup as cause of gout, central obesity, and diabetic progression. I recommended she eliminate this from her diet with follow-up uric acid assessment after 8-12  weeks.

## 2023-06-29 NOTE — Progress Notes (Unsigned)
 NURSING HOME LOCATION:  [facility] ROOM NUMBER:  4 W  CODE STATUS:  Full Code  PCP:  Mary-Margaret Gaylyn Keas FNP  This is a comprehensive admission note to this SNFperformed on this date less than 30 days from date of admission. Included are preadmission medical/surgical history; reconciled medication list; family history; social history and comprehensive review of systems.  Corrections and additions to the records were documented. Comprehensive physical exam was also performed. Additionally a clinical summary was entered for each active diagnosis pertinent to this admission in the Problem List to enhance continuity of care.  HPI: She was hospitalized 4/18 - 06/27/2023 presenting to ED with a 5-day history of periorbital tingling, imbalance with ambulation in the context of a recent fall.  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  Neurology consulted and recommended continuing Eliquis . Total protein was 5.9 and albumin  3.0.  B12 level was 100.  Past medical and surgical history: Includes essential hypertension, dyslipidemia, Surgeries and procedures include AVR Family history: reviewed, non contributory due to advanced age.  Social history:   Review of systems: Clinical neurocognitive deficits made validity of responses questionable , compromising ROS completion.  She validates "I had a stroke and atrial fibrillation."  She states that she became imbalanced and fell through the door landing on a flower pot and table, striking her head.  She denies any cardiac or neurologic prodrome. She did validate having had an AVR "7 years ago" but could not tell me what that year was.  When I ask her if it were 2018 her response was "it is close." She describes some headache yesterday and some intermittent dizziness.  She also states that she has had gout in the right foot since January.  Urinary incontinence is a chronic problem. Constitutional: No fever,  significant weight change, fatigue  Eyes: No redness, discharge, pain, vision change ENT/mouth: No nasal congestion, purulent discharge, earache, change in hearing, sore throat  Cardiovascular: No chest pain, palpitations, paroxysmal nocturnal dyspnea, claudication, edema  Respiratory: No cough, sputum production, hemoptysis, DOE, significant snoring, apnea Gastrointestinal: No heartburn, dysphagia, abdominal pain, nausea /vomiting, rectal bleeding, melena, change in bowels Genitourinary: No dysuria, hematuria, pyuria, incontinence, nocturia Musculoskeletal: No joint stiffness, joint swelling, weakness, pain Dermatologic: No rash, pruritus, change in appearance of skin Neurologic: No dizziness, headache, syncope, seizures, numbness, tingling Psychiatric: No significant anxiety, depression, insomnia, anorexia Endocrine: No change in hair/skin/nails, excessive thirst, excessive hunger, excessive urination  Hematologic/lymphatic: No significant bruising, lymphadenopathy, abnormal bleeding Allergy/immunology: No itchy/watery eyes, significant sneezing, urticaria, angioedema  Physical exam:  Pertinent or positive findings: Responses are slow.  She is missing the left anterior maxillary incisor.  The right lacrimal gland is prominent.  There is a rumbling murmur at the right base.  Rhythm is slightly irregular.  Second heart sound is increased.  The murmur radiates into the l carotid arteries, greater on the left than the right.  Operative scars present of the anterior chest.  Abdomen is protuberant.  Dorsalis pedis pulses are stronger than posterior tibial pulses.  She has 1/2+ edema at the sock line. General appearance: Adequately nourished; no acute distress, increased work of breathing is present.   Lymphatic: No lymphadenopathy about the head, neck, axilla. Eyes: No conjunctival inflammation or lid edema is present. There is no scleral icterus. Ears:  External ear exam shows no significant lesions  or deformities.   Nose:  External nasal examination shows no deformity or inflammation. Nasal mucosa are pink and  moist without lesions, exudates Oral exam: Lips and gums are healthy appearing.There is no oropharyngeal erythema or exudate. Neck:  No thyromegaly, masses, tenderness noted.    Heart:  Normal rate and regular rhythm. S1 and S2 normal without gallop, murmur, click, rub.  Lungs: Chest clear to auscultation without wheezes, rhonchi, rales, rubs. Abdomen: Bowel sounds are normal.  Abdomen is soft and nontender with no organomegaly, hernias, masses. GU: Deferred  Extremities:  No cyanosis, clubbing, edema. Neurologic exam:  Strength equal  in upper & lower extremities. Balance, Rhomberg, finger to nose testing could not be completed due to clinical state Deep tendon reflexes are equal Skin: Warm & dry w/o tenting. No significant lesions or rash.  See clinical summary under each active problem in the Problem List with associated updated therapeutic plan

## 2023-07-01 DIAGNOSIS — R29818 Other symptoms and signs involving the nervous system: Secondary | ICD-10-CM | POA: Insufficient documentation

## 2023-07-01 DIAGNOSIS — E46 Unspecified protein-calorie malnutrition: Secondary | ICD-10-CM | POA: Insufficient documentation

## 2023-07-01 NOTE — Assessment & Plan Note (Signed)
 Rhythm is slightly irregular; rate is well-controlled.  Continue Eliquis  prophylaxis.

## 2023-07-01 NOTE — Assessment & Plan Note (Signed)
 Continue Eliquis  prophylaxis and monitor for any bleeding dyscrasias.

## 2023-07-01 NOTE — Assessment & Plan Note (Signed)
 B12 level was 108 without associated anemia or macrocytosis.  Recheck B12 level in 8-12 weeks to verify adequate absorption.

## 2023-07-01 NOTE — Assessment & Plan Note (Signed)
 TSH is slightly elevated at 5.027; goal would be 1-4.  Recheck TSH in 8 to 12 weeks.

## 2023-07-01 NOTE — Assessment & Plan Note (Addendum)
 LDL 106 and HDL 40.  She has a history of intolerance to both lipophilic and hydrophilic statins.  I shall discuss with her checking the CK and then reinitiating low-dose rosuvastatin  3 times a week with subsequent lipid check in 6-8 weeks.  Her LDL goal is less than 70.

## 2023-07-01 NOTE — Assessment & Plan Note (Deleted)
 Continue Eliquis  prophylaxis and monitor for any bleeding dyscrasias.

## 2023-07-01 NOTE — Assessment & Plan Note (Signed)
 Protein supplementation at the SNF.

## 2023-07-01 NOTE — Assessment & Plan Note (Signed)
 The BCAT is a licensed MMSE assessment tool employed by [employer] [facility]. Scoring:46-50 ... Normal. 34-45.Angela AasAaron AasMild Cognitive Impairment. 26-33.Angela AasAaron AasMild Dementia. 0-25.. Moderate-Severe Dementia. Speech Therapy at [facility] performed this MMSE.  Her score was 33 out of 50 suggesting mild dementia. Imaging suggest probable vascular dementia.

## 2023-07-02 ENCOUNTER — Telehealth: Payer: Self-pay | Admitting: Neurology

## 2023-07-02 ENCOUNTER — Other Ambulatory Visit (HOSPITAL_COMMUNITY)
Admission: RE | Admit: 2023-07-02 | Discharge: 2023-07-02 | Disposition: A | Discharge status: Home / Self Care | Source: Skilled Nursing Facility | Attending: Adult Health | Admitting: Adult Health

## 2023-07-02 DIAGNOSIS — I10 Essential (primary) hypertension: Secondary | ICD-10-CM | POA: Insufficient documentation

## 2023-07-02 DIAGNOSIS — M109 Gout, unspecified: Secondary | ICD-10-CM | POA: Insufficient documentation

## 2023-07-02 LAB — BASIC METABOLIC PANEL WITH GFR
Anion gap: 8 (ref 5–15)
BUN: 24 mg/dL — ABNORMAL HIGH (ref 8–23)
CO2: 29 mmol/L (ref 22–32)
Calcium: 8.9 mg/dL (ref 8.9–10.3)
Chloride: 100 mmol/L (ref 98–111)
Creatinine, Ser: 0.83 mg/dL (ref 0.44–1.00)
GFR, Estimated: >60 mL/min (ref >60)
Glucose, Bld: 98 mg/dL (ref 70–99)
Potassium: 4.3 mmol/L (ref 3.5–5.1)
Sodium: 137 mmol/L (ref 135–145)

## 2023-07-02 NOTE — Telephone Encounter (Signed)
 Facility where pt lives called to confirm appointment details.

## 2023-07-02 NOTE — Assessment & Plan Note (Addendum)
 I discussed the role of high fructose corn syrup as cause of gout, central obesity, and diabetic progression. I recommended she eliminate this from her diet with follow-up uric acid assessment after 8-12  weeks.

## 2023-07-11 ENCOUNTER — Encounter: Payer: Self-pay | Admitting: Adult Health

## 2023-07-11 ENCOUNTER — Other Ambulatory Visit: Payer: Self-pay | Admitting: Adult Health

## 2023-07-11 ENCOUNTER — Non-Acute Institutional Stay (SKILLED_NURSING_FACILITY): Payer: Self-pay | Admitting: Adult Health

## 2023-07-11 DIAGNOSIS — Z952 Presence of prosthetic heart valve: Secondary | ICD-10-CM

## 2023-07-11 DIAGNOSIS — R29818 Other symptoms and signs involving the nervous system: Secondary | ICD-10-CM | POA: Diagnosis not present

## 2023-07-11 DIAGNOSIS — Z8673 Personal history of transient ischemic attack (TIA), and cerebral infarction without residual deficits: Secondary | ICD-10-CM | POA: Diagnosis not present

## 2023-07-11 DIAGNOSIS — R4189 Other symptoms and signs involving cognitive functions and awareness: Secondary | ICD-10-CM

## 2023-07-11 DIAGNOSIS — I700 Atherosclerosis of aorta: Secondary | ICD-10-CM | POA: Diagnosis not present

## 2023-07-11 DIAGNOSIS — I4891 Unspecified atrial fibrillation: Secondary | ICD-10-CM

## 2023-07-11 MED ORDER — APIXABAN 5 MG PO TABS: 5.0000 mg | ORAL_TABLET | Freq: Two times a day (BID) | ORAL | 0 refills | Status: DC

## 2023-07-11 MED ORDER — METOPROLOL TARTRATE 25 MG PO TABS: 25.0000 mg | ORAL_TABLET | Freq: Two times a day (BID) | ORAL | 0 refills | Status: DC

## 2023-07-11 MED ORDER — MECLIZINE HCL 12.5 MG PO TABS: 12.5000 mg | ORAL_TABLET | Freq: Four times a day (QID) | ORAL | 0 refills | Status: DC | PRN

## 2023-07-11 MED ORDER — ROSUVASTATIN CALCIUM 5 MG PO TABS
5.0000 mg | ORAL_TABLET | ORAL | 0 refills | Status: DC
Start: 2023-07-11 — End: 2023-07-26

## 2023-07-11 NOTE — Progress Notes (Signed)
 Location:  [facility] Room Number: 65 W Place of Service:  SNF (31)   CODE STATUS: full   Allergies  Allergen Reactions   Lipitor [Atorvastatin ] Other (See Comments)    Unable to stand or move   Allopurinol  Diarrhea and Nausea And Vomiting    Pt reported this and colchicine  both caused "a lot of stomach issues"   Colchicine  Diarrhea and Nausea And Vomiting    Pt reported this and allopurinol  both caused "a lot of stomach issues"   Meloxicam  Other (See Comments)    Made patient dizzy and caused her heart to race but can take a half tablet   Statins Other (See Comments)    Leg cramps    Crestor  [Rosuvastatin ] Other (See Comments)    Dizziness   Penicillins Rash    Immediate rash, facial/tongue/throat swelling, SOB or lightheadedness with hypotension    Chief Complaint  Patient presents with   Discharge Note    HPI:  She is being discharged to home with home health for pt/ot. She will need a rolling walker. She will need her prescriptions written and will need to follow up with her medical provider. She had been hospitalized for an CVA. She was admitted to this facility for short term rehab. Therapy: ambulate 275 feet with rolling walker and supervision; upper body with supervision; lower body with contact guard assist; stand/pivot supervision; BCAT 33/50  Past Medical History:  Diagnosis Date   Allergy    seasonal   Chicken pox    Complication of anesthesia    difficult to wake up after tubal ligations   Dyspnea    with exertion   Heart murmur    History of bronchitis    History of kidney stones    "only one"   Hypertension    Measles     Past Surgical History:  Procedure Laterality Date   AORTIC VALVE REPLACEMENT N/A 01/17/2016   Procedure: AORTIC VALVE REPLACEMENT (AVR) USING EDWARDS MAGNA EASE PERICARDIAL BIOPROSTHESIS VALVE;  Surgeon: Norita Beauvais, MD;  Location: [facility] OR;  Service: Open Heart Surgery;  Laterality: N/A;    CARDIAC CATHETERIZATION N/A 09/24/2015   Procedure: Right/Left Heart Cath and Coronary Angiography;  Surgeon: Lucendia Rusk, MD;  Location: [facility] INVASIVE CV LAB;  Service: Cardiovascular;  Laterality: N/A;   COLONOSCOPY     REPLACEMENT ASCENDING AORTA N/A 01/17/2016   Procedure: REPLACEMENT ASCENDING AORTA USING HEMASHIELD PLATINUM WOVEN DOUBLE VELOUR VASCULAR GRAFT;  Surgeon: Norita Beauvais, MD;  Location: [facility] OR;  Service: Open Heart Surgery;  Laterality: N/A;   TEE WITHOUT CARDIOVERSION N/A 01/17/2016   Procedure: TRANSESOPHAGEAL ECHOCARDIOGRAM (TEE);  Surgeon: Norita Beauvais, MD;  Location: [facility] OR;  Service: Open Heart Surgery;  Laterality: N/A;   TUBAL LIGATION      Social History   Socioeconomic History   Marital status: Widowed    Spouse name: Not on file   Number of children: 1   Years of education: business school   Highest education level: Bachelor's degree (e.g., BA, AB, BS)  Occupational History   Occupation: retired    Associate Professor: [employer]   Occupation: Retired    Comment: Biochemist, clinical  Tobacco Use   Smoking status: Former    Current packs/day: 0.00    Average packs/day: 0.5 packs/day for 5.2 years (2.6 ttl pk-yrs)    Types: Cigarettes    Start date: 08/04/1968    Quit date: 10/10/1973    Years  since quitting: 49.7    Passive exposure: Current   Smokeless tobacco: Never   Tobacco comments:    smoked less than 1 pack every 2 weeks. Havent smoked in atleast 40 years  Vaping Use   Vaping status: Never Used  Substance and Sexual Activity   Alcohol use: Not Currently    Comment: seldom   Drug use: No   Sexual activity: Not Currently  Other Topics Concern   Not on file  Social History Narrative   Lives alone. Pt states son lives in [place].    Social Drivers of Corporate investment banker Strain: Low Risk  (11/08/2021)   Overall Financial Resource Strain (CARDIA)    Difficulty of Paying Living Expenses: Not hard at all  Food Insecurity: No  Food Insecurity (06/23/2023)   Hunger Vital Sign    Worried About Running Out of Food in the Last Year: Never true    Ran Out of Food in the Last Year: Never true  Transportation Needs: No Transportation Needs (06/23/2023)   PRAPARE - Administrator, Civil Service (Medical): No    Lack of Transportation (Non-Medical): No  Physical Activity: Insufficiently Active (11/08/2021)   Exercise Vital Sign    Days of Exercise per Week: 7 days    Minutes of Exercise per Session: 20 min  Stress: No Stress Concern Present (11/08/2021)   [employer] of Occupational Health - Occupational Stress Questionnaire    Feeling of Stress : Not at all  Social Connections: Socially Isolated (06/23/2023)   Social Connection and Isolation Panel [NHANES]    Frequency of Communication with Friends and Family: More than three times a week    Frequency of Social Gatherings with Friends and Family: More than three times a week    Attends Religious Services: Never    Database administrator or Organizations: No    Attends Banker Meetings: Never    Marital Status: Widowed  Intimate Partner Violence: Not At Risk (06/23/2023)   Humiliation, Afraid, Rape, and Kick questionnaire    Fear of Current or Ex-Partner: No    Emotionally Abused: No    Physically Abused: No    Sexually Abused: No   Family History  Problem Relation Age of Onset   Alzheimer's disease Mother    Heart disease Mother    Heart attack Father 27   Post-traumatic stress disorder Brother    Breast cancer Maternal Aunt    Heart attack Son    Other Son        5 back surgeries      VITAL SIGNS BP (!) 112/59   Pulse 71   Temp (!) 97.3 F (36.3 C)   Resp 20   Ht 4\' 10"  (1.473 m)   Wt 208 lb 12.8 oz (94.7 kg)   SpO2 94%   BMI 43.64 kg/m   Outpatient Encounter Medications as of 07/11/2023  Medication Sig   acetaminophen  (TYLENOL ) 325 MG tablet Take 2 tablets (650 mg total) by mouth every 6 (six) hours as needed for mild  pain.   apixaban  (ELIQUIS ) 5 MG TABS tablet Take 1 tablet (5 mg total) by mouth 2 (two) times daily.   Ascorbic Acid (VITAMIN C PO) Take 1 tablet by mouth daily. 500 mg   Calcium  Carb-Cholecalciferol  600-800 MG-UNIT TABS Take 1 tablet by mouth daily.   Cholecalciferol  (VITAMIN D ) 2000 units CAPS Take 2,000 Units by mouth daily.   [EXPIRED] Cyanocobalamin  1000 MCG/ML KIT Inject 1,000 mcg  as directed once a week. Give on Saturday   feeding supplement (ENSURE ENLIVE / ENSURE PLUS) LIQD Take 237 mLs by mouth 2 (two) times daily between meals.   loratadine  (CLARITIN ) 10 MG tablet Take 10 mg by mouth daily.   meclizine  (ANTIVERT ) 12.5 MG tablet Take 1 tablet (12.5 mg total) by mouth every 6 (six) hours as needed for dizziness.   metoprolol  tartrate (LOPRESSOR ) 25 MG tablet Take 1 tablet (25 mg total) by mouth 2 (two) times daily.   rosuvastatin  (CRESTOR ) 5 MG tablet Take 1 tablet (5 mg total) by mouth 3 (three) times a week.   [START ON 07/21/2023] cyanocobalamin  1000 MCG tablet Take 1,000 mcg by mouth daily.   No facility-administered encounter medications on file as of 07/11/2023.     SIGNIFICANT DIAGNOSTIC EXAMS  TODAY  06-22-23: wbc 9.1; hgb 15.3; hct 44.6; mcv 92.5 plt 212; glucose 108; bun 10; creat 0.91; k+ 3.5; na++ 141; ca 9.3 gfr >60; mag 1.8 06-23-23; wbc 8.7; hgb 14.0; hct 42.5 mcv 94.0 plt 200; glucose 98; bun 10; creat 0.90; k+ 3.2; na++ 140; ca 8.9; gfr >60 mag 1.8; vitamin B12: 108; tsh 5.027 free t4: 0.86; free t3: 2.6; hgb A1c 5.3; chol 169; ldl 106; trig 114; hdl 40    Review of Systems  Constitutional:  Negative for malaise/fatigue.  Respiratory:  Negative for cough and shortness of breath.   Cardiovascular:  Negative for chest pain, palpitations and leg swelling.  Gastrointestinal:  Negative for abdominal pain, constipation and heartburn.  Musculoskeletal:  Negative for back pain, joint pain and myalgias.  Skin: Negative.   Neurological:  Negative for dizziness.   Psychiatric/Behavioral:  The patient is not nervous/anxious.    Physical Exam Constitutional:      General: She is not in acute distress.    Appearance: She is well-developed. She is morbidly obese. She is not diaphoretic.  Neck:     Thyroid : No thyromegaly.  Cardiovascular:     Rate and Rhythm: Normal rate and regular rhythm.     Pulses: Normal pulses.     Heart sounds: Murmur heard.     Comments:  HX: tissue AVR Pulmonary:     Effort: Pulmonary effort is normal. No respiratory distress.     Breath sounds: Normal breath sounds.  Abdominal:     General: Bowel sounds are normal. There is no distension.     Palpations: Abdomen is soft.     Tenderness: There is no abdominal tenderness.  Musculoskeletal:        General: Normal range of motion.     Cervical back: Neck supple.     Right lower leg: No edema.     Left lower leg: No edema.     Comments:  Left side weakness present    Lymphadenopathy:     Cervical: No cervical adenopathy.  Skin:    General: Skin is warm and dry.  Neurological:     Mental Status: She is alert. Mental status is at baseline.  Psychiatric:        Mood and Affect: Mood normal.    ASSESSMENT/ PLAN:   Patient is being discharged with the following home health services:  pt/ot to evaluate and treat as indicated for gait balance strength adl training.   Patient is being discharged with the following durable medical equipment:  rolling walker   Patient has been advised to f/u with their PCP in 1-2 weeks to for a transitions of care visit.  Social services at  their facility was responsible for arranging this appointment.  Pt was provided with adequate prescriptions of noncontrolled medications to reach the scheduled appointment .  For controlled substances, a limited supply was provided as appropriate for the individual patient.  If the pt normally receives these medications from a pain clinic or has a contract with another physician, these medications should  be received from that clinic or physician only).    A 30 day supply of her prescription medications have been sent to [facility] [place]  Time spent with patient 40 minutes: dme; home health, medications  Britt Candle NP [facility]  call [phone]

## 2023-07-12 DIAGNOSIS — I6312 Cerebral infarction due to embolism of basilar artery: Secondary | ICD-10-CM | POA: Diagnosis not present

## 2023-07-12 DIAGNOSIS — R262 Difficulty in walking, not elsewhere classified: Secondary | ICD-10-CM | POA: Diagnosis not present

## 2023-07-12 DIAGNOSIS — M6281 Muscle weakness (generalized): Secondary | ICD-10-CM | POA: Diagnosis not present

## 2023-07-16 ENCOUNTER — Telehealth: Payer: Self-pay

## 2023-07-16 DIAGNOSIS — E46 Unspecified protein-calorie malnutrition: Secondary | ICD-10-CM | POA: Diagnosis not present

## 2023-07-16 DIAGNOSIS — Z9181 History of falling: Secondary | ICD-10-CM | POA: Diagnosis not present

## 2023-07-16 DIAGNOSIS — E538 Deficiency of other specified B group vitamins: Secondary | ICD-10-CM | POA: Diagnosis not present

## 2023-07-16 DIAGNOSIS — I10 Essential (primary) hypertension: Secondary | ICD-10-CM | POA: Diagnosis not present

## 2023-07-16 DIAGNOSIS — I69393 Ataxia following cerebral infarction: Secondary | ICD-10-CM | POA: Diagnosis not present

## 2023-07-16 DIAGNOSIS — M10471 Other secondary gout, right ankle and foot: Secondary | ICD-10-CM | POA: Diagnosis not present

## 2023-07-16 DIAGNOSIS — Z7901 Long term (current) use of anticoagulants: Secondary | ICD-10-CM | POA: Diagnosis not present

## 2023-07-16 DIAGNOSIS — E039 Hypothyroidism, unspecified: Secondary | ICD-10-CM | POA: Diagnosis not present

## 2023-07-16 DIAGNOSIS — I4891 Unspecified atrial fibrillation: Secondary | ICD-10-CM | POA: Diagnosis not present

## 2023-07-16 DIAGNOSIS — Z6841 Body Mass Index (BMI) 40.0 and over, adult: Secondary | ICD-10-CM | POA: Diagnosis not present

## 2023-07-16 DIAGNOSIS — I700 Atherosclerosis of aorta: Secondary | ICD-10-CM | POA: Diagnosis not present

## 2023-07-16 DIAGNOSIS — R41842 Visuospatial deficit: Secondary | ICD-10-CM | POA: Diagnosis not present

## 2023-07-16 NOTE — Transitions of Care (Post Inpatient/ED Visit) (Signed)
 07/16/2023  Name: Angela Dawson MRN: [id number] DOB: 1945/12/08  Today's TOC FU Call Status: Today's TOC FU Call Status:: Successful TOC FU Call Completed TOC FU Call Complete Date: 07/16/23 Patient's Name and Date of Birth confirmed.  Transition Care Management Follow-up Telephone Call Date of Discharge: 07/13/23 Discharge Facility: Other [facility]) Name of Other (Non[facility]) Discharge Facility: [facility] Type of Discharge: Inpatient Admission Primary Inpatient Discharge Diagnosis:: A Fib How have you been since you were released from the hospital?: Better Any questions or concerns?: No  Items Reviewed: Did you receive and understand the discharge instructions provided?: Yes Medications obtained,verified, and reconciled?: Yes (Medications Reviewed) Any new allergies since your discharge?: No Dietary orders reviewed?: NA Do you have support at home?: Yes People in Home [RPT]: child(ren), adult  Medications Reviewed Today: Medications Reviewed Today     Reviewed by Cathye Coca, LPN (Licensed Practical Nurse) on 07/16/23 at 1308  Med List Status: <None>   Medication Order Taking? Sig Documenting Provider Last Dose Status Informant  acetaminophen  (TYLENOL ) 325 MG tablet [id number] Yes Take 2 tablets (650 mg total) by mouth every 6 (six) hours as needed for mild pain. Barrett, Malachy Scripture, PA-C Taking Active Self  apixaban  (ELIQUIS ) 5 MG TABS tablet [id number] Yes Take 1 tablet (5 mg total) by mouth 2 (two) times daily. Marilyne Shu, NP Taking Active   Ascorbic Acid (VITAMIN C PO) [id number] Yes Take 1 tablet by mouth daily. 500 mg [provider] Taking Active Self  Calcium  Carb-Cholecalciferol  600-800 MG-UNIT TABS [id number] Yes Take 1 tablet by mouth daily. [provider] Taking Active Self  Cholecalciferol  (VITAMIN D ) 2000 units CAPS [id number] Yes Take 2,000 Units by mouth daily. [provider] Taking Active Self  cyanocobalamin   1000 MCG tablet [id number] Yes Take 1,000 mcg by mouth daily. [provider] Taking Active   feeding supplement (ENSURE ENLIVE / ENSURE PLUS) LIQD [id number] Yes Take 237 mLs by mouth 2 (two) times daily between meals. Mason Sole, Pratik D, DO Taking Active   loratadine  (CLARITIN ) 10 MG tablet [id number] Yes Take 10 mg by mouth daily. [provider] Taking Active   meclizine  (ANTIVERT ) 12.5 MG tablet [id number] Yes Take 1 tablet (12.5 mg total) by mouth every 6 (six) hours as needed for dizziness. Marilyne Shu, NP Taking Active   metoprolol  tartrate (LOPRESSOR ) 25 MG tablet [id number] Yes Take 1 tablet (25 mg total) by mouth 2 (two) times daily. Marilyne Shu, NP Taking Active   rosuvastatin  (CRESTOR ) 5 MG tablet [id number] Yes Take 1 tablet (5 mg total) by mouth 3 (three) times a week. Marilyne Shu, NP Taking Active             Home Care and Equipment/Supplies: Were Home Health Services Ordered?: Yes Name of Home Health Agency:: [facility] Has Agency set up a time to come to your home?: Yes First Home Health Visit Date: 07/16/23  Functional Questionnaire: Do you need assistance with bathing/showering or dressing?: Yes Do you need assistance with meal preparation?: Yes Do you need assistance with eating?: No Do you have difficulty maintaining continence: No Do you need assistance with getting out of bed/getting out of a chair/moving?: Yes Do you have difficulty managing or taking your medications?: No  Follow up appointments reviewed: PCP Follow-up appointment confirmed?: Yes Date of PCP follow-up appointment?: 08/02/23 Follow-up Provider: Carolann Chum [facility] Follow-up appointment confirmed?: NA Do you need transportation to your follow-up appointment?: No Do you understand care  options if your condition(s) worsen?: Yes-patient verbalized understanding    SIGNATURE Se[place]ron [place], LPN [facility] Health Advisor [place] [facility] You Are. We Are. [facility] Direct Dial [phone]

## 2023-07-18 DIAGNOSIS — Z6841 Body Mass Index (BMI) 40.0 and over, adult: Secondary | ICD-10-CM | POA: Diagnosis not present

## 2023-07-18 DIAGNOSIS — I10 Essential (primary) hypertension: Secondary | ICD-10-CM | POA: Diagnosis not present

## 2023-07-18 DIAGNOSIS — R41842 Visuospatial deficit: Secondary | ICD-10-CM | POA: Diagnosis not present

## 2023-07-18 DIAGNOSIS — E039 Hypothyroidism, unspecified: Secondary | ICD-10-CM | POA: Diagnosis not present

## 2023-07-18 DIAGNOSIS — E46 Unspecified protein-calorie malnutrition: Secondary | ICD-10-CM | POA: Diagnosis not present

## 2023-07-18 DIAGNOSIS — E538 Deficiency of other specified B group vitamins: Secondary | ICD-10-CM | POA: Diagnosis not present

## 2023-07-18 DIAGNOSIS — Z7901 Long term (current) use of anticoagulants: Secondary | ICD-10-CM | POA: Diagnosis not present

## 2023-07-18 DIAGNOSIS — I69393 Ataxia following cerebral infarction: Secondary | ICD-10-CM | POA: Diagnosis not present

## 2023-07-18 DIAGNOSIS — M10471 Other secondary gout, right ankle and foot: Secondary | ICD-10-CM | POA: Diagnosis not present

## 2023-07-18 DIAGNOSIS — I4891 Unspecified atrial fibrillation: Secondary | ICD-10-CM | POA: Diagnosis not present

## 2023-07-18 DIAGNOSIS — Z9181 History of falling: Secondary | ICD-10-CM | POA: Diagnosis not present

## 2023-07-18 DIAGNOSIS — I700 Atherosclerosis of aorta: Secondary | ICD-10-CM | POA: Diagnosis not present

## 2023-07-24 ENCOUNTER — Telehealth: Payer: Self-pay | Admitting: Family Medicine

## 2023-07-24 DIAGNOSIS — E039 Hypothyroidism, unspecified: Secondary | ICD-10-CM | POA: Diagnosis not present

## 2023-07-24 DIAGNOSIS — R41842 Visuospatial deficit: Secondary | ICD-10-CM | POA: Diagnosis not present

## 2023-07-24 DIAGNOSIS — Z7901 Long term (current) use of anticoagulants: Secondary | ICD-10-CM | POA: Diagnosis not present

## 2023-07-24 DIAGNOSIS — I4891 Unspecified atrial fibrillation: Secondary | ICD-10-CM | POA: Diagnosis not present

## 2023-07-24 DIAGNOSIS — I69393 Ataxia following cerebral infarction: Secondary | ICD-10-CM | POA: Diagnosis not present

## 2023-07-24 DIAGNOSIS — Z6841 Body Mass Index (BMI) 40.0 and over, adult: Secondary | ICD-10-CM | POA: Diagnosis not present

## 2023-07-24 DIAGNOSIS — I10 Essential (primary) hypertension: Secondary | ICD-10-CM | POA: Diagnosis not present

## 2023-07-24 DIAGNOSIS — E46 Unspecified protein-calorie malnutrition: Secondary | ICD-10-CM | POA: Diagnosis not present

## 2023-07-24 DIAGNOSIS — Z9181 History of falling: Secondary | ICD-10-CM | POA: Diagnosis not present

## 2023-07-24 DIAGNOSIS — E538 Deficiency of other specified B group vitamins: Secondary | ICD-10-CM | POA: Diagnosis not present

## 2023-07-24 DIAGNOSIS — M10471 Other secondary gout, right ankle and foot: Secondary | ICD-10-CM | POA: Diagnosis not present

## 2023-07-24 DIAGNOSIS — I700 Atherosclerosis of aorta: Secondary | ICD-10-CM | POA: Diagnosis not present

## 2023-07-24 NOTE — Telephone Encounter (Signed)
 Called and spoke with Angela Dawson and gave verbal ok for occupational therapy

## 2023-07-24 NOTE — Telephone Encounter (Signed)
 Copied from CRM [id number]. Topic: Clinical - Home Health Verbal Orders >> Jul 24, 2023  4:04 PM Sasha H wrote: Caller/Agency: Moira Andrews OT/ [facility] Callback Number: [phone] Service Requested: Occupational Therapy Frequency: 1x week for 5 weeks Any new concerns about the patient? No

## 2023-07-26 ENCOUNTER — Other Ambulatory Visit: Payer: Self-pay | Admitting: Nurse Practitioner

## 2023-07-26 DIAGNOSIS — E039 Hypothyroidism, unspecified: Secondary | ICD-10-CM | POA: Diagnosis not present

## 2023-07-26 DIAGNOSIS — R41842 Visuospatial deficit: Secondary | ICD-10-CM | POA: Diagnosis not present

## 2023-07-26 DIAGNOSIS — M10471 Other secondary gout, right ankle and foot: Secondary | ICD-10-CM | POA: Diagnosis not present

## 2023-07-26 DIAGNOSIS — I69393 Ataxia following cerebral infarction: Secondary | ICD-10-CM | POA: Diagnosis not present

## 2023-07-26 DIAGNOSIS — Z6841 Body Mass Index (BMI) 40.0 and over, adult: Secondary | ICD-10-CM | POA: Diagnosis not present

## 2023-07-26 DIAGNOSIS — I10 Essential (primary) hypertension: Secondary | ICD-10-CM | POA: Diagnosis not present

## 2023-07-26 DIAGNOSIS — I700 Atherosclerosis of aorta: Secondary | ICD-10-CM | POA: Diagnosis not present

## 2023-07-26 DIAGNOSIS — I4891 Unspecified atrial fibrillation: Secondary | ICD-10-CM | POA: Diagnosis not present

## 2023-07-26 DIAGNOSIS — E538 Deficiency of other specified B group vitamins: Secondary | ICD-10-CM | POA: Diagnosis not present

## 2023-07-26 DIAGNOSIS — E46 Unspecified protein-calorie malnutrition: Secondary | ICD-10-CM | POA: Diagnosis not present

## 2023-07-26 DIAGNOSIS — Z7901 Long term (current) use of anticoagulants: Secondary | ICD-10-CM | POA: Diagnosis not present

## 2023-07-26 DIAGNOSIS — Z9181 History of falling: Secondary | ICD-10-CM | POA: Diagnosis not present

## 2023-07-26 MED ORDER — MECLIZINE HCL 12.5 MG PO TABS: 12.5000 mg | ORAL_TABLET | Freq: Four times a day (QID) | ORAL | 0 refills | Status: AC | PRN

## 2023-07-26 MED ORDER — ROSUVASTATIN CALCIUM 5 MG PO TABS: 5.0000 mg | ORAL_TABLET | ORAL | 0 refills | Status: DC

## 2023-07-26 NOTE — Telephone Encounter (Signed)
 Copied from CRM [id number]. Topic: Clinical - Medication Refill >> Jul 26, 2023 11:50 AM Blair Bumpers wrote: Medication: meclizine  (ANTIVERT ) 12.5 MG tablet & rosuvastatin  (CRESTOR ) 5 MG tablet  Has the patient contacted their pharmacy? No (Agent: If no, request that the patient contact the pharmacy for the refill. If patient does not wish to contact the pharmacy document the reason why and proceed with request.) (Agent: If yes, when and what did the pharmacy advise?)  This is the patient's preferred pharmacy:   [id number] - [place], [place] - [address] [place] [address] Phone: [phone] Fax: [phone]  Is this the correct pharmacy for this prescription? Yes If no, delete pharmacy and type the correct one.   Has the prescription been filled recently? Yes  Is the patient out of the medication? Yes  Has the patient been seen for an appointment in the last year OR does the patient have an upcoming appointment? Yes  Can we respond through MyChart? Yes  Agent: Please be advised that Rx refills may take up to 3 business days. We ask that you follow-up with your pharmacy.

## 2023-07-27 ENCOUNTER — Telehealth: Payer: Self-pay

## 2023-07-27 NOTE — Telephone Encounter (Signed)
 Copied from CRM [id number]. Topic: General - Other >> Jul 27, 2023 11:16 AM Rennis Case wrote: Reason for CRM: Sent fax to office on 07/23/2023 inquiring about patient's home health diagnosis. Would like to ask for two other codes, Marlin Simmonds asking if she can use the code for Visuospatial deficitdeficit R41.842 and ataxia following cerebral infarction I69.393.   Can take a verbal for the diagnosis codes, Best call back number: [phone]

## 2023-07-27 NOTE — Telephone Encounter (Signed)
Was this fax received?

## 2023-07-31 ENCOUNTER — Telehealth: Payer: Self-pay

## 2023-07-31 NOTE — Telephone Encounter (Signed)
 Copied from CRM [id number]. Topic: General - Other >> Jul 27, 2023 11:16 AM Rennis Case wrote: Reason for CRM: Sent fax to office on 07/23/2023 inquiring about patient's home health diagnosis. Would like to ask for two other codes, Marlin Simmonds asking if she can use the code for Visuospatial deficitdeficit R41.842 and ataxia following cerebral infarction I69.393.   Can take a verbal for the diagnosis codes, Best call back number: [phone]

## 2023-07-31 NOTE — Telephone Encounter (Signed)
 Copied from CRM [id number]. Topic: General - Other >> Jul 27, 2023 11:16 AM Rennis Case wrote: Reason for CRM: Sent fax to office on 07/23/2023 inquiring about patient's home health diagnosis. Would like to ask for two other codes, Angela Dawson asking if she can use the code for Visuospatial deficitdeficit R41.842 and ataxia following cerebral infarction I69.393.   Can take a verbal for the diagnosis codes, Best call back number: [phone] >> Jul 31, 2023  3:49 PM Star East wrote: Angela Dawson with [facility] calling for follow up on the fax sent- please call [phone] secure voicemail  or office [phone]

## 2023-08-01 DIAGNOSIS — R41842 Visuospatial deficit: Secondary | ICD-10-CM | POA: Diagnosis not present

## 2023-08-01 DIAGNOSIS — I4891 Unspecified atrial fibrillation: Secondary | ICD-10-CM | POA: Diagnosis not present

## 2023-08-01 DIAGNOSIS — E46 Unspecified protein-calorie malnutrition: Secondary | ICD-10-CM | POA: Diagnosis not present

## 2023-08-01 DIAGNOSIS — E538 Deficiency of other specified B group vitamins: Secondary | ICD-10-CM | POA: Diagnosis not present

## 2023-08-01 DIAGNOSIS — I700 Atherosclerosis of aorta: Secondary | ICD-10-CM | POA: Diagnosis not present

## 2023-08-01 DIAGNOSIS — M10471 Other secondary gout, right ankle and foot: Secondary | ICD-10-CM | POA: Diagnosis not present

## 2023-08-01 DIAGNOSIS — I10 Essential (primary) hypertension: Secondary | ICD-10-CM | POA: Diagnosis not present

## 2023-08-01 DIAGNOSIS — Z9181 History of falling: Secondary | ICD-10-CM | POA: Diagnosis not present

## 2023-08-01 DIAGNOSIS — Z6841 Body Mass Index (BMI) 40.0 and over, adult: Secondary | ICD-10-CM | POA: Diagnosis not present

## 2023-08-01 DIAGNOSIS — Z7901 Long term (current) use of anticoagulants: Secondary | ICD-10-CM | POA: Diagnosis not present

## 2023-08-01 DIAGNOSIS — E039 Hypothyroidism, unspecified: Secondary | ICD-10-CM | POA: Diagnosis not present

## 2023-08-01 DIAGNOSIS — I69393 Ataxia following cerebral infarction: Secondary | ICD-10-CM | POA: Diagnosis not present

## 2023-08-01 NOTE — Telephone Encounter (Signed)
 Would like to ask for two other codes, Angela Dawson asking if she can use the code for Visuospatial deficitdeficit R41.842 and ataxia following cerebral infarction I69.393.    Can take a verbal for the diagnosis codes, Best call back number: [phone]   Please advise

## 2023-08-01 NOTE — Telephone Encounter (Signed)
 Closing encounter, triplicate request.

## 2023-08-02 ENCOUNTER — Encounter: Payer: Self-pay | Admitting: Nurse Practitioner

## 2023-08-02 ENCOUNTER — Ambulatory Visit: Admitting: Nurse Practitioner

## 2023-08-02 VITALS — BP 166/78 | HR 101 | Temp 96.9°F | Ht <= 58 in | Wt 210.0 lb

## 2023-08-02 DIAGNOSIS — I6312 Cerebral infarction due to embolism of basilar artery: Secondary | ICD-10-CM

## 2023-08-02 DIAGNOSIS — Z952 Presence of prosthetic heart valve: Secondary | ICD-10-CM

## 2023-08-02 DIAGNOSIS — Z09 Encounter for follow-up examination after completed treatment for conditions other than malignant neoplasm: Secondary | ICD-10-CM

## 2023-08-02 MED ORDER — METOPROLOL TARTRATE 25 MG PO TABS: 25.0000 mg | ORAL_TABLET | Freq: Two times a day (BID) | ORAL | 5 refills | Status: DC

## 2023-08-02 MED ORDER — APIXABAN 5 MG PO TABS: 5.0000 mg | ORAL_TABLET | Freq: Two times a day (BID) | ORAL | 1 refills | Status: DC

## 2023-08-02 NOTE — Progress Notes (Signed)
 Subjective:    Patient ID: Angela Dawson, female    DOB: [birth year], 78 y.o.   MRN: [id number]   Chief Complaint: discharge from Nursing acility  HPI  Patient was admitted to the hospital following a stroke. Upon discharge she went to nursing facility for rehab. She is ambulating with walker short distances. She is to continue rehab at home. There was no change to her med list. Patient Active Problem List   Diagnosis Date Noted   Gout of right foot 07/02/2023   Unspecified protein-calorie malnutrition (HCC) 07/01/2023   Neurocognitive deficits 07/01/2023   History of CVA (cerebrovascular accident) 06/28/2023   Aortic atherosclerosis (HCC) 06/28/2023   Chronic nonseasonal allergic rhinitis due to pollen 06/28/2023   Acquired hypothyroidism 06/28/2023   New onset atrial fibrillation (HCC) 06/23/2023   Prolonged QT interval 06/23/2023   History of recent fall 06/23/2023   Falls 06/23/2023   Hypokalemia 06/23/2023   Hypomagnesemia 06/23/2023   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) 06/23/2023   B12 deficiency 06/23/2023   Statin intolerance 10/03/2019   S/P AVR (aortic valve replacement) 01/17/2016   Ascending aorta dilatation (HCC) 11/20/2013   Hyperlipidemia 09/30/2013   Severe obesity (BMI >= 40) (HCC) 09/30/2013   Essential hypertension 07/17/2012       Review of Systems  Constitutional:  Negative for diaphoresis.  Eyes:  Negative for pain.  Respiratory:  Negative for shortness of breath.   Cardiovascular:  Negative for chest pain, palpitations and leg swelling.  Gastrointestinal:  Negative for abdominal pain.  Endocrine: Negative for polydipsia.  Skin:  Negative for rash.  Neurological:  Negative for dizziness, weakness and headaches.  Hematological:  Does not bruise/bleed easily.  All other systems reviewed and are negative.      Objective:   Physical Exam Constitutional:      Appearance: Normal appearance. She is obese.  Cardiovascular:      Rate and Rhythm: Normal rate and regular rhythm.     Heart sounds: Normal heart sounds.  Pulmonary:     Breath sounds: Normal breath sounds.  Musculoskeletal:     Comments: Walking slowly with walker  Skin:    General: Skin is warm.  Neurological:     General: No focal deficit present.     Mental Status: She is alert and oriented to person, place, and time.  Psychiatric:        Mood and Affect: Mood normal.        Behavior: Behavior normal.    BP (!) 166/78   Pulse (!) 101   Temp (!) 96.9 F (36.1 C) (Temporal)   Ht 4\' 10"  (1.473 m)   Wt 210 lb (95.3 kg)   SpO2 93%   BMI 43.89 kg/m         Assessment & Plan:   Angela Dawson in today with chief complaint of Nursing home follow up   1. S/P AVR (aortic valve replacement)  - metoprolol  tartrate (LOPRESSOR ) 25 MG tablet; Take 1 tablet (25 mg total) by mouth 2 (two) times daily.  Dispense: 60 tablet; Refill: 5 - CBC with Differential/Platelet - CMP14+EGFR  2. Hospital discharge follow-up (Primary) Hospital records and [facility]    The above assessment and management plan was discussed with the patient. The patient verbalized understanding of and has agreed to the management plan. Patient is aware to call the clinic if symptoms persist or worsen. Patient is aware when to return to the clinic  for a follow-up visit. Patient educated on when it is appropriate to go to the emergency department.   Mary-Margaret Gaylyn Keas, FNP

## 2023-08-02 NOTE — Patient Instructions (Signed)
 Fall Prevention in the Home, Adult Falls can cause injuries and can happen to people of all ages. There are many things you can do to make your home safer and to help prevent falls. What actions can I take to prevent falls? General information Use good lighting in all rooms. Make sure to: Replace any light bulbs that burn out. Turn on the lights in dark areas and use night-lights. Keep items that you use often in easy-to-reach places. Lower the shelves around your home if needed. Move furniture so that there are clear paths around it. Do not use throw rugs or other things on the floor that can make you trip. If any of your floors are uneven, fix them. Add color or contrast paint or tape to clearly mark and help you see: Grab bars or handrails. First and last steps of staircases. Where the edge of each step is. If you use a ladder or stepladder: Make sure that it is fully opened. Do not climb a closed ladder. Make sure the sides of the ladder are locked in place. Have someone hold the ladder while you use it. Know where your pets are as you move through your home. What can I do in the bathroom?     Keep the floor dry. Clean up any water on the floor right away. Remove soap buildup in the bathtub or shower. Buildup makes bathtubs and showers slippery. Use non-skid mats or decals on the floor of the bathtub or shower. Attach bath mats securely with double-sided, non-slip rug tape. If you need to sit down in the shower, use a non-slip stool. Install grab bars by the toilet and in the bathtub and shower. Do not use towel bars as grab bars. What can I do in the bedroom? Make sure that you have a light by your bed that is easy to reach. Do not use any sheets or blankets on your bed that hang to the floor. Have a firm chair or bench with side arms that you can use for support when you get dressed. What can I do in the kitchen? Clean up any spills right away. If you need to reach something  above you, use a step stool with a grab bar. Keep electrical cords out of the way. Do not use floor polish or wax that makes floors slippery. What can I do with my stairs? Do not leave anything on the stairs. Make sure that you have a light switch at the top and the bottom of the stairs. Make sure that there are handrails on both sides of the stairs. Fix handrails that are broken or loose. Install non-slip stair treads on all your stairs if they do not have carpet. Avoid having throw rugs at the top or bottom of the stairs. Choose a carpet that does not hide the edge of the steps on the stairs. Make sure that the carpet is firmly attached to the stairs. Fix carpet that is loose or worn. What can I do on the outside of my home? Use bright outdoor lighting. Fix the edges of walkways and driveways and fix any cracks. Clear paths of anything that can make you trip, such as tools or rocks. Add color or contrast paint or tape to clearly mark and help you see anything that might make you trip as you walk through a door, such as a raised step or threshold. Trim any bushes or trees on paths to your home. Check to see if handrails are loose  or broken and that both sides of all steps have handrails. Install guardrails along the edges of any raised decks and porches. Have leaves, snow, or ice cleared regularly. Use sand, salt, or ice melter on paths if you live where there is ice and snow during the winter. Clean up any spills in your garage right away. This includes grease or oil spills. What other actions can I take? Review your medicines with your doctor. Some medicines can cause dizziness or changes in blood pressure, which increase your risk of falling. Wear shoes that: Have a low heel. Do not wear high heels. Have rubber bottoms and are closed at the toe. Feel good on your feet and fit well. Use tools that help you move around if needed. These include: Canes. Walkers. Scooters. Crutches. Ask  your doctor what else you can do to help prevent falls. This may include seeing a physical therapist to learn to do exercises to move better and get stronger. Where to find more information Centers for Disease Control and Prevention, STEADI: TonerPromos.no [employer] on Aging: BaseRingTones.pl National Institute on Aging: BaseRingTones.pl Contact a doctor if: You are afraid of falling at home. You feel weak, drowsy, or dizzy at home. You fall at home. Get help right away if you: Lose consciousness or have trouble moving after a fall. Have a fall that causes a head injury. These symptoms may be an emergency. Get help right away. Call 911. Do not wait to see if the symptoms will go away. Do not drive yourself to the hospital. This information is not intended to replace advice given to you by your health care provider. Make sure you discuss any questions you have with your health care provider. Document Revised: 10/24/2021 Document Reviewed: 10/24/2021 Elsevier Patient Education  2024 [employer].

## 2023-08-02 NOTE — Telephone Encounter (Signed)
 VO given per PCP to use two other codes.

## 2023-08-03 ENCOUNTER — Ambulatory Visit: Payer: Self-pay | Admitting: Nurse Practitioner

## 2023-08-03 DIAGNOSIS — I69393 Ataxia following cerebral infarction: Secondary | ICD-10-CM | POA: Diagnosis not present

## 2023-08-03 DIAGNOSIS — I4891 Unspecified atrial fibrillation: Secondary | ICD-10-CM | POA: Diagnosis not present

## 2023-08-03 DIAGNOSIS — E538 Deficiency of other specified B group vitamins: Secondary | ICD-10-CM | POA: Diagnosis not present

## 2023-08-03 DIAGNOSIS — Z7901 Long term (current) use of anticoagulants: Secondary | ICD-10-CM | POA: Diagnosis not present

## 2023-08-03 DIAGNOSIS — I700 Atherosclerosis of aorta: Secondary | ICD-10-CM | POA: Diagnosis not present

## 2023-08-03 DIAGNOSIS — I10 Essential (primary) hypertension: Secondary | ICD-10-CM | POA: Diagnosis not present

## 2023-08-03 DIAGNOSIS — R41842 Visuospatial deficit: Secondary | ICD-10-CM | POA: Diagnosis not present

## 2023-08-03 DIAGNOSIS — Z6841 Body Mass Index (BMI) 40.0 and over, adult: Secondary | ICD-10-CM | POA: Diagnosis not present

## 2023-08-03 DIAGNOSIS — E46 Unspecified protein-calorie malnutrition: Secondary | ICD-10-CM | POA: Diagnosis not present

## 2023-08-03 DIAGNOSIS — Z9181 History of falling: Secondary | ICD-10-CM | POA: Diagnosis not present

## 2023-08-03 DIAGNOSIS — E039 Hypothyroidism, unspecified: Secondary | ICD-10-CM | POA: Diagnosis not present

## 2023-08-03 DIAGNOSIS — M10471 Other secondary gout, right ankle and foot: Secondary | ICD-10-CM | POA: Diagnosis not present

## 2023-08-03 LAB — CMP14+EGFR
ALT: 19 IU/L (ref 0–32)
AST: 18 IU/L (ref 0–40)
Albumin: 4.1 g/dL (ref 3.8–4.8)
Alkaline Phosphatase: 81 IU/L (ref 44–121)
BUN/Creatinine Ratio: 20 (ref 12–28)
BUN: 16 mg/dL (ref 8–27)
Bilirubin Total: 0.4 mg/dL (ref 0.0–1.2)
CO2: 23 mmol/L (ref 20–29)
Calcium: 9.1 mg/dL (ref 8.7–10.3)
Chloride: 99 mmol/L (ref 96–106)
Creatinine, Ser: 0.79 mg/dL (ref 0.57–1.00)
Globulin, Total: 2.7 g/dL (ref 1.5–4.5)
Glucose: 83 mg/dL (ref 70–99)
Potassium: 4.6 mmol/L (ref 3.5–5.2)
Sodium: 141 mmol/L (ref 134–144)
Total Protein: 6.8 g/dL (ref 6.0–8.5)
eGFR: 77 mL/min/{1.73_m2} (ref >59)

## 2023-08-03 LAB — CBC WITH DIFFERENTIAL/PLATELET
Basophils Absolute: 0.1 10*3/uL (ref 0.0–0.2)
Basos: 1 %
EOS (ABSOLUTE): 0.4 10*3/uL (ref 0.0–0.4)
Eos: 4 %
Hematocrit: 42 % (ref 34.0–46.6)
Hemoglobin: 13.7 g/dL (ref 11.1–15.9)
Immature Grans (Abs): 0 10*3/uL (ref 0.0–0.1)
Immature Granulocytes: 0 %
Lymphocytes Absolute: 3.7 10*3/uL — ABNORMAL HIGH (ref 0.7–3.1)
Lymphs: 36 %
MCH: 31.3 pg (ref 26.6–33.0)
MCHC: 32.6 g/dL (ref 31.5–35.7)
MCV: 96 fL (ref 79–97)
Monocytes Absolute: 0.8 10*3/uL (ref 0.1–0.9)
Monocytes: 7 %
Neutrophils Absolute: 5.4 10*3/uL (ref 1.4–7.0)
Neutrophils: 52 %
Platelets: 180 10*3/uL (ref 150–450)
RBC: 4.38 x10E6/uL (ref 3.77–5.28)
RDW: 13.6 % (ref 11.7–15.4)
WBC: 10.3 10*3/uL (ref 3.4–10.8)

## 2023-08-06 ENCOUNTER — Ambulatory Visit: Payer: Medicare HMO | Admitting: Nurse Practitioner

## 2023-08-06 ENCOUNTER — Ambulatory Visit

## 2023-08-06 DIAGNOSIS — M10471 Other secondary gout, right ankle and foot: Secondary | ICD-10-CM | POA: Diagnosis not present

## 2023-08-06 DIAGNOSIS — E46 Unspecified protein-calorie malnutrition: Secondary | ICD-10-CM | POA: Diagnosis not present

## 2023-08-06 DIAGNOSIS — I69393 Ataxia following cerebral infarction: Secondary | ICD-10-CM | POA: Diagnosis not present

## 2023-08-06 DIAGNOSIS — R41842 Visuospatial deficit: Secondary | ICD-10-CM | POA: Diagnosis not present

## 2023-08-06 DIAGNOSIS — I4891 Unspecified atrial fibrillation: Secondary | ICD-10-CM

## 2023-08-06 DIAGNOSIS — E538 Deficiency of other specified B group vitamins: Secondary | ICD-10-CM

## 2023-08-06 DIAGNOSIS — I10 Essential (primary) hypertension: Secondary | ICD-10-CM

## 2023-08-06 DIAGNOSIS — E039 Hypothyroidism, unspecified: Secondary | ICD-10-CM

## 2023-08-06 DIAGNOSIS — I700 Atherosclerosis of aorta: Secondary | ICD-10-CM | POA: Diagnosis not present

## 2023-08-07 DIAGNOSIS — I69393 Ataxia following cerebral infarction: Secondary | ICD-10-CM | POA: Diagnosis not present

## 2023-08-07 DIAGNOSIS — Z7901 Long term (current) use of anticoagulants: Secondary | ICD-10-CM | POA: Diagnosis not present

## 2023-08-07 DIAGNOSIS — E538 Deficiency of other specified B group vitamins: Secondary | ICD-10-CM | POA: Diagnosis not present

## 2023-08-07 DIAGNOSIS — I700 Atherosclerosis of aorta: Secondary | ICD-10-CM | POA: Diagnosis not present

## 2023-08-07 DIAGNOSIS — E039 Hypothyroidism, unspecified: Secondary | ICD-10-CM | POA: Diagnosis not present

## 2023-08-07 DIAGNOSIS — M10471 Other secondary gout, right ankle and foot: Secondary | ICD-10-CM | POA: Diagnosis not present

## 2023-08-07 DIAGNOSIS — I10 Essential (primary) hypertension: Secondary | ICD-10-CM | POA: Diagnosis not present

## 2023-08-07 DIAGNOSIS — I4891 Unspecified atrial fibrillation: Secondary | ICD-10-CM | POA: Diagnosis not present

## 2023-08-07 DIAGNOSIS — R41842 Visuospatial deficit: Secondary | ICD-10-CM | POA: Diagnosis not present

## 2023-08-07 DIAGNOSIS — Z9181 History of falling: Secondary | ICD-10-CM | POA: Diagnosis not present

## 2023-08-07 DIAGNOSIS — Z6841 Body Mass Index (BMI) 40.0 and over, adult: Secondary | ICD-10-CM | POA: Diagnosis not present

## 2023-08-07 DIAGNOSIS — E46 Unspecified protein-calorie malnutrition: Secondary | ICD-10-CM | POA: Diagnosis not present

## 2023-08-13 ENCOUNTER — Telehealth: Payer: Self-pay

## 2023-08-13 ENCOUNTER — Telehealth: Payer: Self-pay | Admitting: Family Medicine

## 2023-08-13 DIAGNOSIS — E538 Deficiency of other specified B group vitamins: Secondary | ICD-10-CM | POA: Diagnosis not present

## 2023-08-13 DIAGNOSIS — Z7901 Long term (current) use of anticoagulants: Secondary | ICD-10-CM | POA: Diagnosis not present

## 2023-08-13 DIAGNOSIS — I69393 Ataxia following cerebral infarction: Secondary | ICD-10-CM | POA: Diagnosis not present

## 2023-08-13 DIAGNOSIS — Z952 Presence of prosthetic heart valve: Secondary | ICD-10-CM

## 2023-08-13 DIAGNOSIS — Z6841 Body Mass Index (BMI) 40.0 and over, adult: Secondary | ICD-10-CM | POA: Diagnosis not present

## 2023-08-13 DIAGNOSIS — E46 Unspecified protein-calorie malnutrition: Secondary | ICD-10-CM | POA: Diagnosis not present

## 2023-08-13 DIAGNOSIS — I700 Atherosclerosis of aorta: Secondary | ICD-10-CM | POA: Diagnosis not present

## 2023-08-13 DIAGNOSIS — I4891 Unspecified atrial fibrillation: Secondary | ICD-10-CM | POA: Diagnosis not present

## 2023-08-13 DIAGNOSIS — R41842 Visuospatial deficit: Secondary | ICD-10-CM | POA: Diagnosis not present

## 2023-08-13 DIAGNOSIS — Z9181 History of falling: Secondary | ICD-10-CM | POA: Diagnosis not present

## 2023-08-13 DIAGNOSIS — M10471 Other secondary gout, right ankle and foot: Secondary | ICD-10-CM | POA: Diagnosis not present

## 2023-08-13 DIAGNOSIS — I10 Essential (primary) hypertension: Secondary | ICD-10-CM | POA: Diagnosis not present

## 2023-08-13 DIAGNOSIS — E039 Hypothyroidism, unspecified: Secondary | ICD-10-CM | POA: Diagnosis not present

## 2023-08-13 NOTE — Telephone Encounter (Signed)
 Called and left message on voicemail that samples are up front and ready to pick up. Also advised that I would refer patient to pharmacist for patient assistance to see if we can help patient with medication cost.

## 2023-08-13 NOTE — Telephone Encounter (Signed)
 Copied from CRM [id number]. Topic: Clinical - Medication Prior Auth >> Aug 13, 2023 10:07 AM Tisa Forester wrote: Reason for CRM: blood thinner other than Eliquis  , that is cheaper , patient insurance will only pay for part of the medication , and patient will still have to pay  $150 just for 30 pills and the prescription provider Delfina Feller put patient on is a 90 day supply which is $400 patient can not afford.   patient call back  please contact  Angela Dawson and Angela Dawson . We Can leave a message on cell phone [phone].   patient number on file is not working

## 2023-08-13 NOTE — Telephone Encounter (Signed)
 Referred patient for medication assistance

## 2023-08-14 DIAGNOSIS — I69393 Ataxia following cerebral infarction: Secondary | ICD-10-CM | POA: Diagnosis not present

## 2023-08-14 DIAGNOSIS — Z6841 Body Mass Index (BMI) 40.0 and over, adult: Secondary | ICD-10-CM | POA: Diagnosis not present

## 2023-08-14 DIAGNOSIS — I700 Atherosclerosis of aorta: Secondary | ICD-10-CM | POA: Diagnosis not present

## 2023-08-14 DIAGNOSIS — Z9181 History of falling: Secondary | ICD-10-CM | POA: Diagnosis not present

## 2023-08-14 DIAGNOSIS — R41842 Visuospatial deficit: Secondary | ICD-10-CM | POA: Diagnosis not present

## 2023-08-14 DIAGNOSIS — E46 Unspecified protein-calorie malnutrition: Secondary | ICD-10-CM | POA: Diagnosis not present

## 2023-08-14 DIAGNOSIS — I4891 Unspecified atrial fibrillation: Secondary | ICD-10-CM | POA: Diagnosis not present

## 2023-08-14 DIAGNOSIS — Z7901 Long term (current) use of anticoagulants: Secondary | ICD-10-CM | POA: Diagnosis not present

## 2023-08-14 DIAGNOSIS — I10 Essential (primary) hypertension: Secondary | ICD-10-CM | POA: Diagnosis not present

## 2023-08-14 DIAGNOSIS — M10471 Other secondary gout, right ankle and foot: Secondary | ICD-10-CM | POA: Diagnosis not present

## 2023-08-14 DIAGNOSIS — E039 Hypothyroidism, unspecified: Secondary | ICD-10-CM | POA: Diagnosis not present

## 2023-08-14 DIAGNOSIS — E538 Deficiency of other specified B group vitamins: Secondary | ICD-10-CM | POA: Diagnosis not present

## 2023-08-16 ENCOUNTER — Telehealth: Payer: Self-pay

## 2023-08-16 NOTE — Progress Notes (Signed)
 Care Guide Pharmacy Note  08/16/2023 Name: Angela Dawson MRN: [id number] DOB: 04-30-1945  Referred By: Delfina Feller, FNP Reason for referral: Complex Care Management (Outreach to schedule with pharm d )   [name] is a 78 y.o. year old female who is a primary care patient of Delfina Feller, FNP.  [name] was referred to the pharmacist for assistance related to: AVR  An unsuccessful telephone outreach was attempted today to contact the patient who was referred to the pharmacy team for assistance with medication assistance. Additional attempts will be made to contact the patient.  [name] , RMA     [name]  [name], [name]  Direct Dial: [phone]  Website: [name]

## 2023-08-21 DIAGNOSIS — E039 Hypothyroidism, unspecified: Secondary | ICD-10-CM | POA: Diagnosis not present

## 2023-08-21 DIAGNOSIS — I69393 Ataxia following cerebral infarction: Secondary | ICD-10-CM | POA: Diagnosis not present

## 2023-08-21 DIAGNOSIS — E538 Deficiency of other specified B group vitamins: Secondary | ICD-10-CM | POA: Diagnosis not present

## 2023-08-21 DIAGNOSIS — I10 Essential (primary) hypertension: Secondary | ICD-10-CM | POA: Diagnosis not present

## 2023-08-21 DIAGNOSIS — Z7901 Long term (current) use of anticoagulants: Secondary | ICD-10-CM | POA: Diagnosis not present

## 2023-08-21 DIAGNOSIS — I4891 Unspecified atrial fibrillation: Secondary | ICD-10-CM | POA: Diagnosis not present

## 2023-08-21 DIAGNOSIS — M10471 Other secondary gout, right ankle and foot: Secondary | ICD-10-CM | POA: Diagnosis not present

## 2023-08-21 DIAGNOSIS — I700 Atherosclerosis of aorta: Secondary | ICD-10-CM | POA: Diagnosis not present

## 2023-08-21 DIAGNOSIS — Z6841 Body Mass Index (BMI) 40.0 and over, adult: Secondary | ICD-10-CM | POA: Diagnosis not present

## 2023-08-21 DIAGNOSIS — E46 Unspecified protein-calorie malnutrition: Secondary | ICD-10-CM | POA: Diagnosis not present

## 2023-08-21 DIAGNOSIS — R41842 Visuospatial deficit: Secondary | ICD-10-CM | POA: Diagnosis not present

## 2023-08-21 DIAGNOSIS — Z9181 History of falling: Secondary | ICD-10-CM | POA: Diagnosis not present

## 2023-08-21 NOTE — Progress Notes (Unsigned)
 Care Guide Pharmacy Note  08/21/2023 Name: RHEMI BALBACH MRN: [id number] DOB: 09/24/1945  Referred By: Delfina Feller, FNP Reason for referral: Complex Care Management (Outreach to schedule with pharm d )   LARRI BREWTON is a 78 y.o. year old female who is a primary care patient of Delfina Feller, FNP.  [name] was referred to the pharmacist for assistance related to: AVR   A second unsuccessful telephone outreach was attempted today to contact the patient who was referred to the pharmacy team for assistance with medication assistance. Additional attempts will be made to contact the patient.  [name] , RMA     [facility] Health  [facility], [facility] Guide  Direct Dial: [phone]  Website: [name]

## 2023-08-24 NOTE — Progress Notes (Signed)
 Care Guide Pharmacy Note  08/24/2023 Name: Angela Dawson MRN: [id number] DOB: 1945/08/23  Referred By: Delfina Feller, FNP Reason for referral: Complex Care Management (Outreach to schedule with pharm d )   Angela Dawson is a 78 y.o. year old female who is a primary care patient of Delfina Feller, FNP.  [name] was referred to the pharmacist for assistance related to: AVF  A third unsuccessful telephone outreach was attempted today to contact the patient who was referred to the pharmacy team for assistance with medication assistance. The Population Health team is pleased to engage with this patient at any time in the future upon receipt of referral and should he/she be interested in assistance from the [facility] team.  [name] , RMA     [facility] Health  [facility], [facility] Guide  Direct Dial: [phone]  Website: [name]

## 2023-09-06 ENCOUNTER — Other Ambulatory Visit: Payer: Self-pay | Admitting: Nurse Practitioner

## 2023-09-17 ENCOUNTER — Encounter

## 2023-09-25 ENCOUNTER — Ambulatory Visit: Admitting: Neurology

## 2023-09-25 ENCOUNTER — Encounter: Payer: Self-pay | Admitting: Neurology

## 2023-09-25 VITALS — BP 160/82 | HR 61 | Ht <= 58 in | Wt 224.0 lb

## 2023-09-25 DIAGNOSIS — I6310 Cerebral infarction due to embolism of unspecified precerebral artery: Secondary | ICD-10-CM | POA: Diagnosis not present

## 2023-09-25 DIAGNOSIS — I4891 Unspecified atrial fibrillation: Secondary | ICD-10-CM | POA: Diagnosis not present

## 2023-09-25 DIAGNOSIS — I69398 Other sequelae of cerebral infarction: Secondary | ICD-10-CM

## 2023-09-25 DIAGNOSIS — R269 Unspecified abnormalities of gait and mobility: Secondary | ICD-10-CM

## 2023-09-25 NOTE — Progress Notes (Signed)
 [facility] [address] [place]. [place] [address] [phone]       OFFICE CONSULT NOTE  Ms. KHOLE ARTERBURN Date of Birth:  11-22-1945 Medical Record Number:  [id number]   Referring MD:, Carmon Fairly, DO  Reason for Referral: Stroke  HPI: Mr. Hinks is a 78 year old pleasant African-American lady seen today for initial office consultation visit for stroke.  She is accompanied by her family lady relative.  History is obtained from them and review of electronic medical records.  I personally reviewed pertinent available imaging films in PACS.  She has past medical history of hypertension, obesity, kidney stones, bronchitis, heart murmur, aortic valve replacement, allergies and chickenpox.  She presented with sudden onset of left face numbness as well as some vision difficulties and concerns for a stroke.  She presented outside time window for thrombolysis with too mild to treat symptoms.  CT head was unremarkable and CT angiogram showed no large vessel stenosis or occlusion.  MRI scan showed a small acute as well as subacute infarcts involving left medulla, pons left temporoparietal, right parietal, bilateral subcortical white matter in a pattern and distribution suggestive of a central embolic source.  Carotid ultrasound showed bilateral calcification with less than 50% stenosis.  Hemoglobin A1c was 5.3.  LDL cholesterol 1 6 mg percent.  Echocardiogram showed ejection fraction of 65 to 70% with mild left atrial dilatation.  Patient was subsequently found to be in new onset A-fib on cardiac monitoring.  She was started on Eliquis  for stroke prevention.  She had some gait balance difficulties and left-sided numbness.  She was discharged home with home physical and Occupational Therapy which she has finished.  She still feels that she staggers a little bit even with a walker.  She has had 2 falls but fortunately no injuries.  She is tolerating Eliquis  well without side effects but is  not happy with her co-pay of $150 a month.  She still has some left perioral numbness which has now shifted to involve the lower lip.  Balance remains poor.  She is tolerating Crestor  well without muscle aches or pains.  Her blood pressure is under good control without today it is elevated at 160/82 in office.  She has had no new recurrent stroke or TIA symptoms.  ROS:   14 system review of systems is positive for numbness, tingling, imbalance, walking difficulty, bruising, staggering gait all other systems negative  PMH:  Past Medical History:  Diagnosis Date   Allergy    seasonal   Chicken pox    Complication of anesthesia    difficult to wake up after tubal ligations   Dyspnea    with exertion   Heart murmur    History of bronchitis    History of kidney stones    only one   Hypertension    Measles     Social History:  Social History   Socioeconomic History   Marital status: Widowed    Spouse name: Not on file   Number of children: 1   Years of education: business school   Highest education level: Bachelor's degree (e.g., BA, AB, BS)  Occupational History   Occupation: retired    Associate Professor: [employer]   Occupation: Retired    Comment: Biochemist, clinical  Tobacco Use   Smoking status: Former    Current packs/day: 0.00    Average packs/day: 0.5 packs/day for 5.2 years (2.6 ttl pk-yrs)    Types: Cigarettes    Start date:  08/04/1968    Quit date: 10/10/1973    Years since quitting: 49.9    Passive exposure: Current   Smokeless tobacco: Never   Tobacco comments:    smoked less than 1 pack every 2 weeks. Havent smoked in atleast 40 years  Vaping Use   Vaping status: Never Used  Substance and Sexual Activity   Alcohol use: Not Currently    Comment: seldom   Drug use: No   Sexual activity: Not Currently  Other Topics Concern   Not on file  Social History Narrative   Lives alone. Pt states son lives in [place].    Retired    Writer Strain: Low Risk  (11/08/2021)   Overall Financial Resource Strain (CARDIA)    Difficulty of Paying Living Expenses: Not hard at all  Food Insecurity: No Food Insecurity (06/23/2023)   Hunger Vital Sign    Worried About Running Out of Food in the Last Year: Never true    Ran Out of Food in the Last Year: Never true  Transportation Needs: No Transportation Needs (06/23/2023)   PRAPARE - Administrator, Civil Service (Medical): No    Lack of Transportation (Non-Medical): No  Physical Activity: Insufficiently Active (11/08/2021)   Exercise Vital Sign    Days of Exercise per Week: 7 days    Minutes of Exercise per Session: 20 min  Stress: No Stress Concern Present (11/08/2021)   [employer] of Occupational Health - Occupational Stress Questionnaire    Feeling of Stress : Not at all  Social Connections: Socially Isolated (06/23/2023)   Social Connection and Isolation Panel    Frequency of Communication with Friends and Family: More than three times a week    Frequency of Social Gatherings with Friends and Family: More than three times a week    Attends Religious Services: Never    Database administrator or Organizations: No    Attends Banker Meetings: Never    Marital Status: Widowed  Intimate Partner Violence: Not At Risk (06/23/2023)   Humiliation, Afraid, Rape, and Kick questionnaire    Fear of Current or Ex-Partner: No    Emotionally Abused: No    Physically Abused: No    Sexually Abused: No    Medications:   Current Outpatient Medications on File Prior to Visit  Medication Sig Dispense Refill   acetaminophen  (TYLENOL ) 325 MG tablet Take 2 tablets (650 mg total) by mouth every 6 (six) hours as needed for mild pain.     apixaban  (ELIQUIS ) 5 MG TABS tablet Take 1 tablet (5 mg total) by mouth 2 (two) times daily. 180 tablet 1   Ascorbic Acid (VITAMIN C PO) Take 1 tablet by mouth daily. 500 mg     Calcium  Carb-Cholecalciferol  600-800  MG-UNIT TABS Take 1 tablet by mouth daily.     Cholecalciferol  (VITAMIN D ) 2000 units CAPS Take 2,000 Units by mouth daily.     cyanocobalamin  1000 MCG tablet Take 1,000 mcg by mouth daily.     feeding supplement (ENSURE ENLIVE / ENSURE PLUS) LIQD Take 237 mLs by mouth 2 (two) times daily between meals. 237 mL 12   loratadine  (CLARITIN ) 10 MG tablet Take 10 mg by mouth daily.     meclizine  (ANTIVERT ) 12.5 MG tablet Take 1 tablet (12.5 mg total) by mouth every 6 (six) hours as needed for dizziness. 30 tablet 0   metoprolol  tartrate (LOPRESSOR ) 25 MG tablet Take 1 tablet (  25 mg total) by mouth 2 (two) times daily. 60 tablet 5   rosuvastatin  (CRESTOR ) 5 MG tablet TAKE 1 TABLET BY MOUTH 3 TIMES A WEEK. 12 tablet 0   No current facility-administered medications on file prior to visit.    Allergies:   Allergies  Allergen Reactions   Lipitor [Atorvastatin ] Other (See Comments)    Unable to stand or move   Allopurinol  Diarrhea and Nausea And Vomiting    Pt reported this and colchicine  both caused a lot of stomach issues   Colchicine  Diarrhea and Nausea And Vomiting    Pt reported this and allopurinol  both caused a lot of stomach issues   Meloxicam  Other (See Comments)    Made patient dizzy and caused her heart to race but can take a half tablet   Statins Other (See Comments)    Leg cramps    Crestor  [Rosuvastatin ] Other (See Comments)    Dizziness   Penicillins Rash    Immediate rash, facial/tongue/throat swelling, SOB or lightheadedness with hypotension    Physical Exam General: Obese elderly African-American lady seated, in no evident distress Head: head normocephalic and atraumatic.   Neck: supple with no carotid or supraclavicular bruits Cardiovascular: regular rate and rhythm, no murmurs Musculoskeletal: no deformity Skin:  no rash/petichiae Vascular:  Normal pulses all extremities  Neurologic Exam Mental Status: Awake and fully alert. Oriented to place and time. Recent and  remote memory intact. Attention span, concentration and fund of knowledge appropriate. Mood and affect appropriate.  Cranial Nerves: Fundoscopic exam reveals sharp disc margins. Pupils equal, briskly reactive to light. Extraocular movements full without nystagmus. Visual fields full to confrontation. Hearing intact. Facial sensation intact. Face, tongue, palate moves normally and symmetrically.  Motor: Normal bulk and tone. Normal strength in all tested extremity muscles. Sensory.: intact to touch , pinprick , position and vibratory sensation.  Mild left lower lip numbness Coordination: Rapid alternating movements normal in all extremities. Finger-to-nose and heel-to-shin performed accurately bilaterally. Gait and Station: Arises from chair without difficulty.  Slightly broad-based gait using a walker with mild imbalance when she turns.  Unsteady with standing on either foot unsupported Reflexes: 1+ and symmetric. Toes downgoing.   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  He is doing well except for mild residual gait imbalance.  Vascular risk factors of atrial fibrillation, hyperlipidemia, hypertension and obesity     PLAN:I had a long d/w patient and her lady family member  about her recent embolic strokes, new onset atrial fibrillation, post stroke gait difficulty,, risk for recurrent stroke/TIAs, personally independently reviewed imaging studies and stroke evaluation results and answered questions.Continue Eliquis  5 mg twice daily for secondary stroke prevention and maintain strict control of hypertension with blood pressure goal below 130/90, diabetes with hemoglobin A1c goal below 6.5% and lipids with LDL cholesterol goal below 70 mg/dL. I also advised the patient to eat a healthy diet with plenty of whole grains, cereals,  fruits and vegetables, exercise regularly and maintain ideal body weight .recommend referral for outpatient physical Occupational Therapy for gait and balance training.  Advised her to use her walker at all times and we discussed fall safety precautions.  I also advised her to see a cardiologist Dr. Alvan to follow-up for her new onset A-fib.  Followup in the future with me only as needed.   I personally spent  a total of 50 minutes in the care of the patient today including getting/reviewing separately obtained history, performing a medically appropriate exam/evaluation, counseling and educating, placing orders, referring and communicating with other health care professionals, documenting clinical information in the EHR, independently interpreting results, and coordinating care.        Eather Popp, MD Note: This document was prepared with digital dictation and possible smart phrase technology. Any transcriptional errors that result from this process are unintentional.

## 2023-09-25 NOTE — Patient Instructions (Signed)
 I had a long d/w patient and her lady family member  about her recent embolic strokes, new onset atrial fibrillation, post stroke gait difficulty,, risk for recurrent stroke/TIAs, personally independently reviewed imaging studies and stroke evaluation results and answered questions.Continue Eliquis  5 mg twice daily for secondary stroke prevention and maintain strict control of hypertension with blood pressure goal below 130/90, diabetes with hemoglobin A1c goal below 6.5% and lipids with LDL cholesterol goal below 70 mg/dL. I also advised the patient to eat a healthy diet with plenty of whole grains, cereals, fruits and vegetables, exercise regularly and maintain ideal body weight .recommend referral for outpatient physical Occupational Therapy for gait and balance training.  Advised her to use her walker at all times and we discussed fall safety precautions.  I also advised her to see a cardiologist Dr. Alvan to follow-up for her new onset A-fib.  Followup in the future with me only as needed.  Fall Prevention in the Home, Adult Falls can cause injuries and affect people of all ages. There are many simple things that you can do to make your home safe and to help prevent falls. If you need it, ask for help making these changes. What actions can I take to prevent falls? General information Use good lighting in all rooms. Make sure to: Replace any light bulbs that burn out. Turn on lights if it is dark and use night-lights. Keep items that you use often in easy-to-reach places. Lower the shelves around your home if needed. Move furniture so that there are clear paths around it. Do not keep throw rugs or other things on the floor that can make you trip. If any of your floors are uneven, fix them. Add color or contrast paint or tape to clearly mark and help you see: Grab bars or handrails. First and last steps of staircases. Where the edge of each step is. If you use a ladder or stepladder: Make sure  that it is fully opened. Do not climb a closed ladder. Make sure the sides of the ladder are locked in place. Have someone hold the ladder while you use it. Know where your pets are as you move through your home. What can I do in the bathroom?     Keep the floor dry. Clean up any water that is on the floor right away. Remove soap buildup in the bathtub or shower. Buildup makes bathtubs and showers slippery. Use non-skid mats or decals on the floor of the bathtub or shower. Attach bath mats securely with double-sided, non-slip rug tape. If you need to sit down while you are in the shower, use a non-slip stool. Install grab bars by the toilet and in the bathtub and shower. Do not use towel bars as grab bars. What can I do in the bedroom? Make sure that you have a light by your bed that is easy to reach. Do not use any sheets or blankets on your bed that hang to the floor. Have a firm bench or chair with side arms that you can use for support when you get dressed. What can I do in the kitchen? Clean up any spills right away. If you need to reach something above you, use a sturdy step stool that has a grab bar. Keep electrical cables out of the way. Do not use floor polish or wax that makes floors slippery. What can I do with my stairs? Do not leave anything on the stairs. Make sure that you have a  light switch at the top and the bottom of the stairs. Have them installed if you do not have them. Make sure that there are handrails on both sides of the stairs. Fix handrails that are broken or loose. Make sure that handrails are as long as the staircases. Install non-slip stair treads on all stairs in your home if they do not have carpet. Avoid having throw rugs at the top or bottom of stairs, or secure the rugs with carpet tape to prevent them from moving. Choose a carpet design that does not hide the edge of steps on the stairs. Make sure that carpet is firmly attached to the stairs. Fix any  carpet that is loose or worn. What can I do on the outside of my home? Use bright outdoor lighting. Repair the edges of walkways and driveways and fix any cracks. Clear paths of anything that can make you trip, such as tools or rocks. Add color or contrast paint or tape to clearly mark and help you see high doorway thresholds. Trim any bushes or trees on the main path into your home. Check that handrails are securely fastened and in good repair. Both sides of all steps should have handrails. Install guardrails along the edges of any raised decks or porches. Have leaves, snow, and ice cleared regularly. Use sand, salt, or ice melt on walkways during winter months if you live where there is ice and snow. In the garage, clean up any spills right away, including grease or oil spills. What other actions can I take? Review your medicines with your health care provider. Some medicines can make you confused or feel dizzy. This can increase your chance of falling. Wear closed-toe shoes that fit well and support your feet. Wear shoes that have rubber soles and low heels. Use a cane, walker, scooter, or crutches that help you move around if needed. Talk with your provider about other ways that you can decrease your risk of falls. This may include seeing a physical therapist to learn to do exercises to improve movement and strength. Where to find more information Centers for Disease Control and Prevention, [employer]: TonerPromos.no [employer] on Aging: BaseRingTones.pl National Institute on Aging: BaseRingTones.pl Contact a health care provider if: You are afraid of falling at home. You feel weak, drowsy, or dizzy at home. You fall at home. Get help right away if you: Lose consciousness or have trouble moving after a fall. Have a fall that causes a head injury. These symptoms may be an emergency. Get help right away. Call 911. Do not wait to see if the symptoms will go away. Do not drive yourself to the  hospital. This information is not intended to replace advice given to you by your health care provider. Make sure you discuss any questions you have with your health care provider. Document Revised: 10/24/2021 Document Reviewed: 10/24/2021 Elsevier Patient Education  2024 Elsevier Inc.  Stroke Prevention Some medical conditions and behaviors can lead to a higher chance of having a stroke. You can help prevent a stroke by eating healthy, exercising, not smoking, and managing any medical conditions you have. Stroke is a leading cause of functional impairment. Primary prevention is particularly important because a majority of strokes are first-time events. Stroke changes the lives of not only those who experience a stroke but also their family and other caregivers. How can this condition affect me? A stroke is a medical emergency and should be treated right away. A stroke can lead to brain  damage and can sometimes be life-threatening. If a person gets medical treatment right away, there is a better chance of surviving and recovering from a stroke. What can increase my risk? The following medical conditions may increase your risk of a stroke: Cardiovascular disease. High blood pressure (hypertension). Diabetes. High cholesterol. Sickle cell disease. Blood clotting disorders (hypercoagulable state). Obesity. Sleep disorders (obstructive sleep apnea). Other risk factors include: Being older than age 63. Having a history of blood clots, stroke, or mini-stroke (transient ischemic attack, TIA). Genetic factors, such as race, ethnicity, or a family history of stroke. Smoking cigarettes or using other tobacco products. Taking birth control pills, especially if you also use tobacco. Heavy use of alcohol or drugs, especially cocaine and methamphetamine. Physical inactivity. What actions can I take to prevent this? Manage your health conditions High cholesterol levels. Eating a healthy diet is  important for preventing high cholesterol. If cholesterol cannot be managed through diet alone, you may need to take medicines. Take any prescribed medicines to control your cholesterol as told by your health care provider. Hypertension. To reduce your risk of stroke, try to keep your blood pressure below 130/80. Eating a healthy diet and exercising regularly are important for controlling blood pressure. If these steps are not enough to manage your blood pressure, you may need to take medicines. Take any prescribed medicines to control hypertension as told by your health care provider. Ask your health care provider if you should monitor your blood pressure at home. Have your blood pressure checked every year, even if your blood pressure is normal. Blood pressure increases with age and some medical conditions. Diabetes. Eating a healthy diet and exercising regularly are important parts of managing your blood sugar (glucose). If your blood sugar cannot be managed through diet and exercise, you may need to take medicines. Take any prescribed medicines to control your diabetes as told by your health care provider. Get evaluated for obstructive sleep apnea. Talk to your health care provider about getting a sleep evaluation if you snore a lot or have excessive sleepiness. Make sure that any other medical conditions you have, such as atrial fibrillation or atherosclerosis, are managed. Nutrition Follow instructions from your health care provider about what to eat or drink to help manage your health condition. These instructions may include: Reducing your daily calorie intake. Limiting how much salt (sodium) you use to 1,500 milligrams (mg) each day. Using only healthy fats for cooking, such as olive oil, canola oil, or sunflower oil. Eating healthy foods. You can do this by: Choosing foods that are high in fiber, such as whole grains, and fresh fruits and vegetables. Eating at least 5 servings of fruits  and vegetables a day. Try to fill one-half of your plate with fruits and vegetables at each meal. Choosing lean protein foods, such as lean cuts of meat, poultry without skin, fish, tofu, beans, and nuts. Eating low-fat dairy products. Avoiding foods that are high in sodium. This can help lower blood pressure. Avoiding foods that have saturated fat, trans fat, and cholesterol. This can help prevent high cholesterol. Avoiding processed and prepared foods. Counting your daily carbohydrate intake.  Lifestyle If you drink alcohol: Limit how much you have to: 0-1 drink a day for women who are not pregnant. 0-2 drinks a day for men. Know how much alcohol is in your drink. In the [place] one drink equals one 12 oz bottle of beer ( ), one 5 oz glass of wine ( ), or one 1 oz glass  of hard liquor (44mL). Do not use any products that contain nicotine or tobacco. These products include cigarettes, chewing tobacco, and vaping devices, such as e-cigarettes. If you need help quitting, ask your health care provider. Avoid secondhand smoke. Do not use drugs. Activity  Try to stay at a healthy weight. Get at least 30 minutes of exercise on most days, such as: Fast walking. Biking. Swimming. Medicines Take over-the-counter and prescription medicines only as told by your health care provider. Aspirin  or blood thinners (antiplatelets or anticoagulants) may be recommended to reduce your risk of forming blood clots that can lead to stroke. Avoid taking birth control pills. Talk to your health care provider about the risks of taking birth control pills if: You are over 31 years old. You smoke. You get very bad headaches. You have had a blood clot. Where to find more information American Stroke Association: www.strokeassociation.org Get help right away if: You or a loved one has any symptoms of a stroke. BE FAST is an easy way to remember the main warning signs of a stroke: B - Balance. Signs are  dizziness, sudden trouble walking, or loss of balance. E - Eyes. Signs are trouble seeing or a sudden change in vision. F - Face. Signs are sudden weakness or numbness of the face, or the face or eyelid drooping on one side. A - Arms. Signs are weakness or numbness in an arm. This happens suddenly and usually on one side of the body. S - Speech. Signs are sudden trouble speaking, slurred speech, or trouble understanding what people say. T - Time. Time to call emergency services. Write down what time symptoms started. You or a loved one has other signs of a stroke, such as: A sudden, severe headache with no known cause. Nausea or vomiting. Seizure. These symptoms may represent a serious problem that is an emergency. Do not wait to see if the symptoms will go away. Get medical help right away. Call your local emergency services (911 in the [place] Do not drive yourself to the hospital. Summary You can help to prevent a stroke by eating healthy, exercising, not smoking, limiting alcohol intake, and managing any medical conditions you may have. Do not use any products that contain nicotine or tobacco. These include cigarettes, chewing tobacco, and vaping devices, such as e-cigarettes. If you need help quitting, ask your health care provider. Remember BE FAST for warning signs of a stroke. Get help right away if you or a loved one has any of these signs. This information is not intended to replace advice given to you by your health care provider. Make sure you discuss any questions you have with your health care provider. Document Revised: 01/23/2022 Document Reviewed: 01/23/2022 Elsevier Patient Education  2024 [employer].

## 2023-10-02 ENCOUNTER — Telehealth: Payer: Self-pay | Admitting: Nurse Practitioner

## 2023-10-02 NOTE — Telephone Encounter (Signed)
 Tried calling patient to let her know that we still have samples of ELIQUIS  in the cabinet up front for her to pick up. Looks like her Eliquis  Rx expired so Im not sure if she is still taking. Need to confirm this and also if she still wants the samples.   Was not able to leave a voicemail.

## 2023-10-03 ENCOUNTER — Ambulatory Visit: Attending: Neurology

## 2023-10-03 DIAGNOSIS — R2681 Unsteadiness on feet: Secondary | ICD-10-CM | POA: Insufficient documentation

## 2023-10-03 DIAGNOSIS — I6310 Cerebral infarction due to embolism of unspecified precerebral artery: Secondary | ICD-10-CM | POA: Insufficient documentation

## 2023-10-03 DIAGNOSIS — I69398 Other sequelae of cerebral infarction: Secondary | ICD-10-CM | POA: Diagnosis not present

## 2023-10-03 DIAGNOSIS — R269 Unspecified abnormalities of gait and mobility: Secondary | ICD-10-CM | POA: Diagnosis not present

## 2023-10-03 DIAGNOSIS — Z9181 History of falling: Secondary | ICD-10-CM | POA: Insufficient documentation

## 2023-10-03 NOTE — Therapy (Signed)
 OUTPATIENT PHYSICAL THERAPY NEURO EVALUATION   Patient Name: Angela Dawson MRN: [id number] DOB:Jun 30, 1945, 78 y.o., female Today's Date: 10/03/2023   PCP: Gladis Mustard, FNP REFERRING PROVIDER: Rosemarie Eather RAMAN, MD   END OF SESSION:  PT End of Session - 10/03/23 1347     Visit Number 1    Number of Visits 12    Date for PT Re-Evaluation 11/30/23    PT Start Time 1353    PT Stop Time 1430    PT Time Calculation (min) 37 min    Activity Tolerance Patient tolerated treatment well    Behavior During Therapy WFL for tasks assessed/performed          Past Medical History:  Diagnosis Date   Allergy    seasonal   Chicken pox    Complication of anesthesia    difficult to wake up after tubal ligations   Dyspnea    with exertion   Heart murmur    History of bronchitis    History of kidney stones    only one   Hypertension    Measles    Past Surgical History:  Procedure Laterality Date   AORTIC VALVE REPLACEMENT N/A 01/17/2016   Procedure: AORTIC VALVE REPLACEMENT (AVR) USING EDWARDS MAGNA EASE PERICARDIAL BIOPROSTHESIS VALVE;  Surgeon: Dallas KATHEE Jude, MD;  Location: [facility] OR;  Service: Open Heart Surgery;  Laterality: N/A;   CARDIAC CATHETERIZATION N/A 09/24/2015   Procedure: Right/Left Heart Cath and Coronary Angiography;  Surgeon: Candyce RAMAN Reek, MD;  Location: [facility] INVASIVE CV LAB;  Service: Cardiovascular;  Laterality: N/A;   COLONOSCOPY     REPLACEMENT ASCENDING AORTA N/A 01/17/2016   Procedure: REPLACEMENT ASCENDING AORTA USING HEMASHIELD PLATINUM WOVEN DOUBLE VELOUR VASCULAR GRAFT;  Surgeon: Dallas KATHEE Jude, MD;  Location: [facility] OR;  Service: Open Heart Surgery;  Laterality: N/A;   TEE WITHOUT CARDIOVERSION N/A 01/17/2016   Procedure: TRANSESOPHAGEAL ECHOCARDIOGRAM (TEE);  Surgeon: Dallas KATHEE Jude, MD;  Location: [facility] OR;  Service: Open Heart Surgery;  Laterality: N/A;   TUBAL LIGATION     Patient Active Problem List   Diagnosis Date Noted    Gout of right foot 07/02/2023   Unspecified protein-calorie malnutrition (HCC) 07/01/2023   Neurocognitive deficits 07/01/2023   History of CVA (cerebrovascular accident) 06/28/2023   Aortic atherosclerosis (HCC) 06/28/2023   Chronic nonseasonal allergic rhinitis due to pollen 06/28/2023   Acquired hypothyroidism 06/28/2023   New onset atrial fibrillation (HCC) 06/23/2023   Prolonged QT interval 06/23/2023   History of recent fall 06/23/2023   Falls 06/23/2023   Hypokalemia 06/23/2023   Hypomagnesemia 06/23/2023   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) 06/23/2023   B12 deficiency 06/23/2023   Statin intolerance 10/03/2019   S/P AVR (aortic valve replacement) 01/17/2016   Ascending aorta dilatation (HCC) 11/20/2013   Hyperlipidemia 09/30/2013   Severe obesity (BMI >= 40) (HCC) 09/30/2013   Essential hypertension 07/17/2012    ONSET DATE: May 2025  REFERRING DIAG: Cerebrovascular accident (CVA) due to embolism of precerebral artery, Gait disturbance, post-stroke   THERAPY DIAG:  Unsteadiness on feet  History of falling  Rationale for Evaluation and Treatment: Rehabilitation  SUBJECTIVE:  SUBJECTIVE STATEMENT: Patient reports that she has been having trouble getting around since May 2025. She did not realize it at the time, but she had two strokes. She has fallen twice since she came home (Mothers day weekend). The first fall her walker slipped out from under her, but the second fall occurred when she was trying to reach and grab her walker. She notes that her vision has gotten blurry since May as well. She uses her walker to help her get around now, but she did not need it prior to May. She is currently living in her son's rental house currently, but she has a caregiver with her most  of the time. She has begun having some incontinence (she was educated to contact her physician regarding this change). She had home health physical therapy for about 5-6 weeks.  Pt accompanied by: self  PERTINENT HISTORY: HTN, atrial fibrillation, and history of a CVA, and allergies  PAIN:  Are you having pain? No  PRECAUTIONS: Fall  RED FLAGS: None   WEIGHT BEARING RESTRICTIONS: No  FALLS: Has patient fallen in last 6 months? Yes. Number of falls 2  LIVING ENVIRONMENT: Lives with: lives alone and has a caregiver with her most of the time Lives in: House/apartment Stairs: Yes: External: 1 steps; none Has following equipment at home: Walker - 2 wheeled  PLOF: Independent  PATIENT GOALS: improved balance and safety  OBJECTIVE:  Note: Objective measures were completed at Evaluation unless otherwise noted.  DIAGNOSTIC FINDINGS: 06/23/23 brain MRI MPRESSION: 1. Positive for a Recent Embolic Event, with scattered small subacute infarcts in the anterior and posterior circulation. Notable involvement of the left lateral medulla, the central pons. No associated hemorrhage or mass effect. 2. Underlying fairly severe chronic small vessel disease, including a small number of chronic microhemorrhages. COGNITION: Overall cognitive status: Within functional limits for tasks assessed   SENSATION: Patient reports numbness around her lips on the left side of her face.   POSTURE: flexed trunk   LOWER EXTREMITY ROM: WFL for activities assessed  UPPER EXTREMITY MMT:  MMT Right eval Left eval  Shoulder flexion 4/5 4/5  Shoulder extension    Shoulder abduction 3+/5 3+/5  Shoulder adduction    Shoulder extension    Shoulder internal rotation    Shoulder external rotation    Middle trapezius    Lower trapezius    Elbow flexion 4/5 4/5  Elbow extension 3+/5 3+/5  Wrist flexion    Wrist extension    Wrist ulnar deviation    Wrist radial deviation    Wrist pronation    Wrist  supination    Grip strength 20 20   (Blank rows = not tested)   LOWER EXTREMITY MMT:    MMT Right Eval Left Eval  Hip flexion 3+/5 3+/5  Hip extension    Hip abduction    Hip adduction    Hip internal rotation    Hip external rotation    Knee flexion 5/5 4/5; slight calf pain  Knee extension 4/5 4+/5  Ankle dorsiflexion 4-/5 4-/5  Ankle plantarflexion    Ankle inversion    Ankle eversion    (Blank rows = not tested)  BED MOBILITY:  Not tested  TRANSFERS: Sit to stand: Modified independence  Assistive device utilized: Environmental consultant - 2 wheeled     Stand to sit: Modified independence  Assistive device utilized: Environmental consultant - 2 wheeled      GAIT: Findings: Gait Characteristics: step through pattern, decreased stride length, trunk  flexed, poor foot clearance- Right, and poor foot clearance- Left, Assistive device utilized:Walker - 2 wheeled, and Level of assistance: Modified independence Gait speed: 0.43 m/s  FUNCTIONAL TESTS:  5 times sit to stand: 27.83 seconds                                                                                                 TREATMENT DATE:     PATIENT EDUCATION: Education details: healing, atrial fibrillation, objective findings, prognosis, plan of care, and goals for physical therapy Person educated: Patient Education method: Explanation Education comprehension: verbalized understanding  HOME EXERCISE PROGRAM:   GOALS: Goals reviewed with patient? Yes  SHORT TERM GOALS: Target date: 10/24/23  Patient will be independent with her initial HEP.  Baseline: Goal status: INITIAL  2.  Patient will improve her gait speed to at least 0.6 m/s for improved functional mobility.  Baseline:  Goal status: INITIAL  3.  Patient will improve her five time sit to stand time to 20 seconds or less for improved lower extremity power.  Baseline:  Goal status: INITIAL  LONG TERM GOALS: Target date: 11/14/23  Patient will be independent with her advanced  HEP.  Baseline:  Goal status: INITIAL  2.  Patient will improve her gait speed to at least 0.8 m/s for improved household mobility.  Baseline:  Goal status: INITIAL  3.  Patient will improve her five time sit to stand time to 15 seconds or less to reduce her fall risk.  Baseline:  Goal status: INITIAL  4.  Patient will be able to transfer from sitting to standing without upper extremity support for improved independence.  Baseline:  Goal status: INITIAL  ASSESSMENT:  CLINICAL IMPRESSION: Patient is a 78 y.o. female who was seen today for physical therapy evaluation and treatment for unsteadiness on her feet secondary to a CVA in May 2025. She is at a high fall risk as evidenced by her gait speed, objective measures, and history of falling. Recommend that she continue with skilled physical therapy to address her impairments to return to her prior level of function.    OBJECTIVE IMPAIRMENTS: Abnormal gait, decreased activity tolerance, decreased balance, decreased mobility, difficulty walking, decreased strength, and postural dysfunction.   ACTIVITY LIMITATIONS: standing, stairs, transfers, and locomotion level  PARTICIPATION LIMITATIONS: shopping and community activity  PERSONAL FACTORS: Age, Past/current experiences, Time since onset of injury/illness/exacerbation, Transportation, and 3+ comorbidities: HTN, atrial fibrillation, and history of a CVA, and allergies are also affecting patient's functional outcome.   REHAB POTENTIAL: Good  CLINICAL DECISION MAKING: Evolving/moderate complexity  EVALUATION COMPLEXITY: Moderate  PLAN:  PT FREQUENCY: 2x/week  PT DURATION: 6 weeks  PLANNED INTERVENTIONS: 97164- PT Re-evaluation, 97750- Physical Performance Testing, 97110-Therapeutic exercises, 97530- Therapeutic activity, 97112- Neuromuscular re-education, 97535- Self Care, [id number]- Manual therapy, [phone]- Gait training, Patient/Family education, Balance training, Stair training,  Cryotherapy, and Moist heat  PLAN FOR NEXT SESSION: Nustep, lower extremity strengthening, gait training, and balance interventions   Lacinda JAYSON Fass, PT 10/03/2023, 3:39 PM

## 2023-10-05 ENCOUNTER — Other Ambulatory Visit: Payer: Self-pay | Admitting: Nurse Practitioner

## 2023-10-08 ENCOUNTER — Ambulatory Visit: Admitting: Physical Therapy

## 2023-10-09 ENCOUNTER — Ambulatory Visit

## 2023-10-10 ENCOUNTER — Ambulatory Visit: Attending: Neurology

## 2023-10-10 DIAGNOSIS — R2681 Unsteadiness on feet: Secondary | ICD-10-CM | POA: Diagnosis not present

## 2023-10-10 DIAGNOSIS — Z9181 History of falling: Secondary | ICD-10-CM | POA: Diagnosis not present

## 2023-10-10 NOTE — Therapy (Signed)
 OUTPATIENT PHYSICAL THERAPY NEURO TREATMENT   Patient Name: Angela Dawson MRN: [id number] DOB:03-Jan-1946, 78 y.o., female Today's Date: 10/10/2023   PCP: Gladis Mustard, FNP REFERRING PROVIDER: Rosemarie Eather RAMAN, MD   END OF SESSION:  PT End of Session - 10/10/23 1427     Visit Number 2    Number of Visits 12    Date for PT Re-Evaluation 11/30/23    PT Start Time 1428    PT Stop Time 1512    PT Time Calculation (min) 44 min    Activity Tolerance Patient tolerated treatment well    Behavior During Therapy WFL for tasks assessed/performed           Past Medical History:  Diagnosis Date   Allergy    seasonal   Chicken pox    Complication of anesthesia    difficult to wake up after tubal ligations   Dyspnea    with exertion   Heart murmur    History of bronchitis    History of kidney stones    only one   Hypertension    Measles    Past Surgical History:  Procedure Laterality Date   AORTIC VALVE REPLACEMENT N/A 01/17/2016   Procedure: AORTIC VALVE REPLACEMENT (AVR) USING EDWARDS MAGNA EASE PERICARDIAL BIOPROSTHESIS VALVE;  Surgeon: Dallas KATHEE Jude, MD;  Location: [facility] OR;  Service: Open Heart Surgery;  Laterality: N/A;   CARDIAC CATHETERIZATION N/A 09/24/2015   Procedure: Right/Left Heart Cath and Coronary Angiography;  Surgeon: Candyce RAMAN Reek, MD;  Location: [facility] INVASIVE CV LAB;  Service: Cardiovascular;  Laterality: N/A;   COLONOSCOPY     REPLACEMENT ASCENDING AORTA N/A 01/17/2016   Procedure: REPLACEMENT ASCENDING AORTA USING HEMASHIELD PLATINUM WOVEN DOUBLE VELOUR VASCULAR GRAFT;  Surgeon: Dallas KATHEE Jude, MD;  Location: [facility] OR;  Service: Open Heart Surgery;  Laterality: N/A;   TEE WITHOUT CARDIOVERSION N/A 01/17/2016   Procedure: TRANSESOPHAGEAL ECHOCARDIOGRAM (TEE);  Surgeon: Dallas KATHEE Jude, MD;  Location: [facility] OR;  Service: Open Heart Surgery;  Laterality: N/A;   TUBAL LIGATION     Patient Active Problem List   Diagnosis Date Noted    Gout of right foot 07/02/2023   Unspecified protein-calorie malnutrition (HCC) 07/01/2023   Neurocognitive deficits 07/01/2023   History of CVA (cerebrovascular accident) 06/28/2023   Aortic atherosclerosis (HCC) 06/28/2023   Chronic nonseasonal allergic rhinitis due to pollen 06/28/2023   Acquired hypothyroidism 06/28/2023   New onset atrial fibrillation (HCC) 06/23/2023   Prolonged QT interval 06/23/2023   History of recent fall 06/23/2023   Falls 06/23/2023   Hypokalemia 06/23/2023   Hypomagnesemia 06/23/2023   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) 06/23/2023   B12 deficiency 06/23/2023   Statin intolerance 10/03/2019   S/P AVR (aortic valve replacement) 01/17/2016   Ascending aorta dilatation (HCC) 11/20/2013   Hyperlipidemia 09/30/2013   Severe obesity (BMI >= 40) (HCC) 09/30/2013   Essential hypertension 07/17/2012    ONSET DATE: May 2025  REFERRING DIAG: Cerebrovascular accident (CVA) due to embolism of precerebral artery, Gait disturbance, post-stroke   THERAPY DIAG:  Unsteadiness on feet  History of falling  Rationale for Evaluation and Treatment: Rehabilitation  SUBJECTIVE:  SUBJECTIVE STATEMENT: Patient reports that she is doing rough today.  Pt accompanied by: self  PERTINENT HISTORY: HTN, atrial fibrillation, and history of a CVA, and allergies  PAIN:  Are you having pain? No  PRECAUTIONS: Fall  RED FLAGS: None   WEIGHT BEARING RESTRICTIONS: No  FALLS: Has patient fallen in last 6 months? Yes. Number of falls 2  LIVING ENVIRONMENT: Lives with: lives alone and has a caregiver with her most of the time Lives in: House/apartment Stairs: Yes: External: 1 steps; none Has following equipment at home: Walker - 2 wheeled  PLOF: Independent  PATIENT  GOALS: improved balance and safety  OBJECTIVE:  Note: Objective measures were completed at Evaluation unless otherwise noted.  DIAGNOSTIC FINDINGS: 06/23/23 brain MRI MPRESSION: 1. Positive for a Recent Embolic Event, with scattered small subacute infarcts in the anterior and posterior circulation. Notable involvement of the left lateral medulla, the central pons. No associated hemorrhage or mass effect. 2. Underlying fairly severe chronic small vessel disease, including a small number of chronic microhemorrhages. COGNITION: Overall cognitive status: Within functional limits for tasks assessed   SENSATION: Patient reports numbness around her lips on the left side of her face.   POSTURE: flexed trunk   LOWER EXTREMITY ROM: WFL for activities assessed  UPPER EXTREMITY MMT:  MMT Right eval Left eval  Shoulder flexion 4/5 4/5  Shoulder extension    Shoulder abduction 3+/5 3+/5  Shoulder adduction    Shoulder extension    Shoulder internal rotation    Shoulder external rotation    Middle trapezius    Lower trapezius    Elbow flexion 4/5 4/5  Elbow extension 3+/5 3+/5  Wrist flexion    Wrist extension    Wrist ulnar deviation    Wrist radial deviation    Wrist pronation    Wrist supination    Grip strength 20 20   (Blank rows = not tested)   LOWER EXTREMITY MMT:    MMT Right Eval Left Eval  Hip flexion 3+/5 3+/5  Hip extension    Hip abduction    Hip adduction    Hip internal rotation    Hip external rotation    Knee flexion 5/5 4/5; slight calf pain  Knee extension 4/5 4+/5  Ankle dorsiflexion 4-/5 4-/5  Ankle plantarflexion    Ankle inversion    Ankle eversion    (Blank rows = not tested)  BED MOBILITY:  Not tested  TRANSFERS: Sit to stand: Modified independence  Assistive device utilized: Environmental consultant - 2 wheeled     Stand to sit: Modified independence  Assistive device utilized: Environmental consultant - 2 wheeled      GAIT: Findings: Gait Characteristics: step through  pattern, decreased stride length, trunk flexed, poor foot clearance- Right, and poor foot clearance- Left, Assistive device utilized:Walker - 2 wheeled, and Level of assistance: Modified independence Gait speed: 0.43 m/s  FUNCTIONAL TESTS:  5 times sit to stand: 27.83 seconds  TREATMENT DATE:                                    10/10/23  EXERCISE LOG  Exercise Repetitions and Resistance Comments  Nustep  L3 x 15 minutes   Seated hip ADD isometric  3 minutes w/ 5 second hold   Rocker board (seated) 4 minutes    Seated marching  1 x 1 minute and 1 x 1.5 minutes Alternating LE  Seated clams  Red t-band x 3 minutes     Seated HS curl  Red t-band x 30 reps each     Blank cell = exercise not performed today   PATIENT EDUCATION: Education details: healing, prognosis Person educated: Patient Education method: Explanation Education comprehension: verbalized understanding  HOME EXERCISE PROGRAM:   GOALS: Goals reviewed with patient? Yes  SHORT TERM GOALS: Target date: 10/24/23  Patient will be independent with her initial HEP.  Baseline: Goal status: INITIAL  2.  Patient will improve her gait speed to at least 0.6 m/s for improved functional mobility.  Baseline:  Goal status: INITIAL  3.  Patient will improve her five time sit to stand time to 20 seconds or less for improved lower extremity power.  Baseline:  Goal status: INITIAL  LONG TERM GOALS: Target date: 11/14/23  Patient will be independent with her advanced HEP.  Baseline:  Goal status: INITIAL  2.  Patient will improve her gait speed to at least 0.8 m/s for improved household mobility.  Baseline:  Goal status: INITIAL  3.  Patient will improve her five time sit to stand time to 15 seconds or less to reduce her fall risk.  Baseline:  Goal status: INITIAL  4.  Patient will be able to transfer from sitting to standing  without upper extremity support for improved independence.  Baseline:  Goal status: INITIAL  ASSESSMENT:  CLINICAL IMPRESSION: Patient was introduced to multiple new interventions for improved lower extremity strength and endurance needed for improved functional mobility. She required minimal cueing with today's interventions for proper exercise performance.  She required brief rest breaks throughout treatment due to increased fatigue.  She reported feeling alright upon the conclusion of treatment.  Recommend that she continue with skilled physical therapy to address her remaining impairments to maximize her functional mobility.  OBJECTIVE IMPAIRMENTS: Abnormal gait, decreased activity tolerance, decreased balance, decreased mobility, difficulty walking, decreased strength, and postural dysfunction.   ACTIVITY LIMITATIONS: standing, stairs, transfers, and locomotion level  PARTICIPATION LIMITATIONS: shopping and community activity  PERSONAL FACTORS: Age, Past/current experiences, Time since onset of injury/illness/exacerbation, Transportation, and 3+ comorbidities: HTN, atrial fibrillation, and history of a CVA, and allergies are also affecting patient's functional outcome.   REHAB POTENTIAL: Good  CLINICAL DECISION MAKING: Evolving/moderate complexity  EVALUATION COMPLEXITY: Moderate  PLAN:  PT FREQUENCY: 2x/week  PT DURATION: 6 weeks  PLANNED INTERVENTIONS: 97164- PT Re-evaluation, 97750- Physical Performance Testing, 97110-Therapeutic exercises, 97530- Therapeutic activity, 97112- Neuromuscular re-education, 97535- Self Care, [id number]- Manual therapy, [phone]- Gait training, Patient/Family education, Balance training, Stair training, Cryotherapy, and Moist heat  PLAN FOR NEXT SESSION: Nustep, lower extremity strengthening, gait training, and balance interventions   Lacinda JAYSON Fass, PT 10/10/2023, 4:23 PM

## 2023-10-11 ENCOUNTER — Encounter

## 2023-10-15 ENCOUNTER — Ambulatory Visit

## 2023-10-15 DIAGNOSIS — Z9181 History of falling: Secondary | ICD-10-CM

## 2023-10-15 DIAGNOSIS — R2681 Unsteadiness on feet: Secondary | ICD-10-CM | POA: Diagnosis not present

## 2023-10-15 NOTE — Therapy (Signed)
 OUTPATIENT PHYSICAL THERAPY NEURO TREATMENT   Patient Name: Angela Dawson MRN: [id number] DOB:05/14/1945, 78 y.o., female Today's Date: 10/15/2023   PCP: Gladis Mustard, FNP REFERRING PROVIDER: Rosemarie Eather RAMAN, MD   END OF SESSION:  PT End of Session - 10/15/23 1427     Visit Number 3    Number of Visits 12    Date for PT Re-Evaluation 11/30/23    PT Start Time 1427    PT Stop Time 1511    PT Time Calculation (min) 44 min    Activity Tolerance Patient tolerated treatment well    Behavior During Therapy WFL for tasks assessed/performed           Past Medical History:  Diagnosis Date   Allergy    seasonal   Chicken pox    Complication of anesthesia    difficult to wake up after tubal ligations   Dyspnea    with exertion   Heart murmur    History of bronchitis    History of kidney stones    only one   Hypertension    Measles    Past Surgical History:  Procedure Laterality Date   AORTIC VALVE REPLACEMENT N/A 01/17/2016   Procedure: AORTIC VALVE REPLACEMENT (AVR) USING EDWARDS MAGNA EASE PERICARDIAL BIOPROSTHESIS VALVE;  Surgeon: Dallas KATHEE Jude, MD;  Location: [facility] OR;  Service: Open Heart Surgery;  Laterality: N/A;   CARDIAC CATHETERIZATION N/A 09/24/2015   Procedure: Right/Left Heart Cath and Coronary Angiography;  Surgeon: Candyce RAMAN Reek, MD;  Location: [facility] INVASIVE CV LAB;  Service: Cardiovascular;  Laterality: N/A;   COLONOSCOPY     REPLACEMENT ASCENDING AORTA N/A 01/17/2016   Procedure: REPLACEMENT ASCENDING AORTA USING HEMASHIELD PLATINUM WOVEN DOUBLE VELOUR VASCULAR GRAFT;  Surgeon: Dallas KATHEE Jude, MD;  Location: [facility] OR;  Service: Open Heart Surgery;  Laterality: N/A;   TEE WITHOUT CARDIOVERSION N/A 01/17/2016   Procedure: TRANSESOPHAGEAL ECHOCARDIOGRAM (TEE);  Surgeon: Dallas KATHEE Jude, MD;  Location: [facility] OR;  Service: Open Heart Surgery;  Laterality: N/A;   TUBAL LIGATION     Patient Active Problem List   Diagnosis Date Noted    Gout of right foot 07/02/2023   Unspecified protein-calorie malnutrition (HCC) 07/01/2023   Neurocognitive deficits 07/01/2023   History of CVA (cerebrovascular accident) 06/28/2023   Aortic atherosclerosis (HCC) 06/28/2023   Chronic nonseasonal allergic rhinitis due to pollen 06/28/2023   Acquired hypothyroidism 06/28/2023   New onset atrial fibrillation (HCC) 06/23/2023   Prolonged QT interval 06/23/2023   History of recent fall 06/23/2023   Falls 06/23/2023   Hypokalemia 06/23/2023   Hypomagnesemia 06/23/2023   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) 06/23/2023   B12 deficiency 06/23/2023   Statin intolerance 10/03/2019   S/P AVR (aortic valve replacement) 01/17/2016   Ascending aorta dilatation (HCC) 11/20/2013   Hyperlipidemia 09/30/2013   Severe obesity (BMI >= 40) (HCC) 09/30/2013   Essential hypertension 07/17/2012    ONSET DATE: May 2025  REFERRING DIAG: Cerebrovascular accident (CVA) due to embolism of precerebral artery, Gait disturbance, post-stroke   THERAPY DIAG:  Unsteadiness on feet  History of falling  Rationale for Evaluation and Treatment: Rehabilitation  SUBJECTIVE:  SUBJECTIVE STATEMENT: Patient reports they were very tired after last session. Patient states they feel alright today.   Pt accompanied by: self  PERTINENT HISTORY: HTN, atrial fibrillation, and history of a CVA, and allergies  PAIN:  Are you having pain? No  PRECAUTIONS: Fall  RED FLAGS: None   WEIGHT BEARING RESTRICTIONS: No  FALLS: Has patient fallen in last 6 months? Yes. Number of falls 2  LIVING ENVIRONMENT: Lives with: lives alone and has a caregiver with her most of the time Lives in: House/apartment Stairs: Yes: External: 1 steps; none Has following equipment at home:  Walker - 2 wheeled  PLOF: Independent  PATIENT GOALS: improved balance and safety  OBJECTIVE:  Note: Objective measures were completed at Evaluation unless otherwise noted.  DIAGNOSTIC FINDINGS: 06/23/23 brain MRI MPRESSION: 1. Positive for a Recent Embolic Event, with scattered small subacute infarcts in the anterior and posterior circulation. Notable involvement of the left lateral medulla, the central pons. No associated hemorrhage or mass effect. 2. Underlying fairly severe chronic small vessel disease, including a small number of chronic microhemorrhages. COGNITION: Overall cognitive status: Within functional limits for tasks assessed   SENSATION: Patient reports numbness around her lips on the left side of her face.   POSTURE: flexed trunk   LOWER EXTREMITY ROM: WFL for activities assessed  UPPER EXTREMITY MMT:  MMT Right eval Left eval  Shoulder flexion 4/5 4/5  Shoulder extension    Shoulder abduction 3+/5 3+/5  Shoulder adduction    Shoulder extension    Shoulder internal rotation    Shoulder external rotation    Middle trapezius    Lower trapezius    Elbow flexion 4/5 4/5  Elbow extension 3+/5 3+/5  Wrist flexion    Wrist extension    Wrist ulnar deviation    Wrist radial deviation    Wrist pronation    Wrist supination    Grip strength 20 20   (Blank rows = not tested)   LOWER EXTREMITY MMT:    MMT Right Eval Left Eval  Hip flexion 3+/5 3+/5  Hip extension    Hip abduction    Hip adduction    Hip internal rotation    Hip external rotation    Knee flexion 5/5 4/5; slight calf pain  Knee extension 4/5 4+/5  Ankle dorsiflexion 4-/5 4-/5  Ankle plantarflexion    Ankle inversion    Ankle eversion    (Blank rows = not tested)  BED MOBILITY:  Not tested  TRANSFERS: Sit to stand: Modified independence  Assistive device utilized: Environmental consultant - 2 wheeled     Stand to sit: Modified independence  Assistive device utilized: Environmental consultant - 2 wheeled       GAIT: Findings: Gait Characteristics: step through pattern, decreased stride length, trunk flexed, poor foot clearance- Right, and poor foot clearance- Left, Assistive device utilized:Walker - 2 wheeled, and Level of assistance: Modified independence Gait speed: 0.43 m/s  FUNCTIONAL TESTS:  5 times sit to stand: 27.83 seconds  TREATMENT DATE:    10/15/2023                                   EXERCISE LOG  Exercise Repetitions and Resistance Comments  Nustep L3 x 15 mins   Seated Marching 1.5 min x 2   LAQ 2 min 5s hold   HS curl-seated Red tb 2x20 ea   Hip Abd- seated  3 min   Hip add iso  3 min   Smiles and frowns 20 ea  Red t- bar  Mid rows 15 ea   Alternating UE   Blank cell = exercise not performed today                                   10/10/23  EXERCISE LOG  Exercise Repetitions and Resistance Comments  Nustep  L3 x 15 minutes   Seated hip ADD isometric  3 minutes w/ 5 second hold   Rocker board (seated) 4 minutes    Seated marching  1 x 1 minute and 1 x 1.5 minutes Alternating LE  Seated clams  Red t-band x 3 minutes     Seated HS curl  Red t-band x 30 reps each     Blank cell = exercise not performed today   PATIENT EDUCATION: Education details: healing, prognosis Person educated: Patient Education method: Explanation Education comprehension: verbalized understanding  HOME EXERCISE PROGRAM:   GOALS: Goals reviewed with patient? Yes  SHORT TERM GOALS: Target date: 10/24/23  Patient will be independent with her initial HEP.  Baseline: Goal status: INITIAL  2.  Patient will improve her gait speed to at least 0.6 m/s for improved functional mobility.  Baseline:  Goal status: INITIAL  3.  Patient will improve her five time sit to stand time to 20 seconds or less for improved lower extremity power.  Baseline:  Goal status: INITIAL  LONG TERM GOALS: Target date:  11/14/23  Patient will be independent with her advanced HEP.  Baseline:  Goal status: INITIAL  2.  Patient will improve her gait speed to at least 0.8 m/s for improved household mobility.  Baseline:  Goal status: INITIAL  3.  Patient will improve her five time sit to stand time to 15 seconds or less to reduce her fall risk.  Baseline:  Goal status: INITIAL  4.  Patient will be able to transfer from sitting to standing without upper extremity support for improved independence.  Baseline:  Goal status: INITIAL  ASSESSMENT:  CLINICAL IMPRESSION:  Patient presents for visit 3 today stating they were very tired and sore after last visit. Session consisted of similar exercises to last visit to build up activity tolerance. Patient required verbal cueing for LAQ today to complete the exercise appropriately. Initiating UE work today with grip strengthening and wrist extension/flexion strengthening for transfers. Patient reported feeling fatigued toward end of session today with most of the soreness being in her hips bilaterally. Patient would benefit from continue with skilled physical therapy to address her remaining impairments to maximize her functional mobility.  OBJECTIVE IMPAIRMENTS: Abnormal gait, decreased activity tolerance, decreased balance, decreased mobility, difficulty walking, decreased strength, and postural dysfunction.   ACTIVITY LIMITATIONS: standing, stairs, transfers, and locomotion level  PARTICIPATION LIMITATIONS: shopping and community activity  PERSONAL FACTORS: Age, Past/current experiences, Time since onset of injury/illness/exacerbation, Transportation, and 3+ comorbidities: HTN,  atrial fibrillation, and history of a CVA, and allergies are also affecting patient's functional outcome.   REHAB POTENTIAL: Good  CLINICAL DECISION MAKING: Evolving/moderate complexity  EVALUATION COMPLEXITY: Moderate  PLAN:  PT FREQUENCY: 2x/week  PT DURATION: 6 weeks  PLANNED  INTERVENTIONS: 97164- PT Re-evaluation, 97750- Physical Performance Testing, 97110-Therapeutic exercises, 97530- Therapeutic activity, 97112- Neuromuscular re-education, 97535- Self Care, 02859- Manual therapy, [phone]- Gait training, Patient/Family education, Balance training, Stair training, Cryotherapy, and Moist heat  PLAN FOR NEXT SESSION: Nustep, lower extremity strengthening, gait training, and balance interventions   Estefana Jude, Student-PT 10/15/2023, 3:24 PM

## 2023-10-16 ENCOUNTER — Encounter

## 2023-10-17 ENCOUNTER — Ambulatory Visit

## 2023-10-17 DIAGNOSIS — R2681 Unsteadiness on feet: Secondary | ICD-10-CM | POA: Diagnosis not present

## 2023-10-17 DIAGNOSIS — Z9181 History of falling: Secondary | ICD-10-CM

## 2023-10-17 NOTE — Therapy (Signed)
 OUTPATIENT PHYSICAL THERAPY NEURO TREATMENT   Patient Name: Angela Dawson MRN: [id number] DOB:1945/12/28, 78 y.o., female Today's Date: 10/17/2023   PCP: Gladis Mustard, FNP REFERRING PROVIDER: Rosemarie Eather RAMAN, MD   END OF SESSION:  PT End of Session - 10/17/23 1439     Visit Number 4    Number of Visits 12    Date for PT Re-Evaluation 11/30/23    PT Start Time 1435    PT Stop Time 1513    PT Time Calculation (min) 38 min    Activity Tolerance Patient tolerated treatment well    Behavior During Therapy WFL for tasks assessed/performed           Past Medical History:  Diagnosis Date   Allergy    seasonal   Chicken pox    Complication of anesthesia    difficult to wake up after tubal ligations   Dyspnea    with exertion   Heart murmur    History of bronchitis    History of kidney stones    only one   Hypertension    Measles    Past Surgical History:  Procedure Laterality Date   AORTIC VALVE REPLACEMENT N/A 01/17/2016   Procedure: AORTIC VALVE REPLACEMENT (AVR) USING EDWARDS MAGNA EASE PERICARDIAL BIOPROSTHESIS VALVE;  Surgeon: Dallas KATHEE Jude, MD;  Location: [facility] OR;  Service: Open Heart Surgery;  Laterality: N/A;   CARDIAC CATHETERIZATION N/A 09/24/2015   Procedure: Right/Left Heart Cath and Coronary Angiography;  Surgeon: Candyce RAMAN Reek, MD;  Location:  [facility] INVASIVE CV LAB;  Service: Cardiovascular;  Laterality: N/A;   COLONOSCOPY     REPLACEMENT ASCENDING AORTA N/A 01/17/2016   Procedure: REPLACEMENT ASCENDING AORTA USING HEMASHIELD PLATINUM WOVEN DOUBLE VELOUR VASCULAR GRAFT;  Surgeon: Dallas KATHEE Jude, MD;  Location: [facility] OR;  Service: Open Heart Surgery;  Laterality: N/A;   TEE WITHOUT CARDIOVERSION N/A 01/17/2016   Procedure: TRANSESOPHAGEAL ECHOCARDIOGRAM (TEE);  Surgeon: Dallas KATHEE Jude, MD;  Location: [facility];  Service: Open Heart Surgery;  Laterality: N/A;   TUBAL LIGATION     Patient Active Problem List   Diagnosis Date Noted    Gout of right foot 07/02/2023   Unspecified protein-calorie malnutrition (HCC) 07/01/2023   Neurocognitive deficits 07/01/2023   History of CVA (cerebrovascular accident) 06/28/2023   Aortic atherosclerosis (HCC) 06/28/2023   Chronic nonseasonal allergic rhinitis due to pollen 06/28/2023   Acquired hypothyroidism 06/28/2023   New onset atrial fibrillation (HCC) 06/23/2023   Prolonged QT interval 06/23/2023   History of recent fall 06/23/2023   Falls 06/23/2023   Hypokalemia 06/23/2023   Hypomagnesemia 06/23/2023   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) 06/23/2023   B12 deficiency 06/23/2023   Statin intolerance 10/03/2019   S/P AVR (aortic valve replacement) 01/17/2016   Ascending aorta dilatation (HCC) 11/20/2013   Hyperlipidemia 09/30/2013   Severe obesity (BMI >= 40) (HCC) 09/30/2013   Essential hypertension 07/17/2012    ONSET DATE: May 2025  REFERRING DIAG: Cerebrovascular accident (CVA) due to embolism of precerebral artery, Gait disturbance, post-stroke   THERAPY DIAG:  Unsteadiness on feet  History of falling  Rationale for Evaluation and Treatment: Rehabilitation  SUBJECTIVE:  SUBJECTIVE STATEMENT: Patient reports they are a little breathless today and was a little sore after last session.   Pt accompanied by: self  PERTINENT HISTORY: HTN, atrial fibrillation, and history of a CVA, and allergies  PAIN:  Are you having pain? No  PRECAUTIONS: Fall  RED FLAGS: None   WEIGHT BEARING RESTRICTIONS: No  FALLS: Has patient fallen in last 6 months? Yes. Number of falls 2  LIVING ENVIRONMENT: Lives with: lives alone and has a caregiver with her most of the time Lives in: House/apartment Stairs: Yes: External: 1 steps; none Has following equipment at home: Walker  - 2 wheeled  PLOF: Independent  PATIENT GOALS: improved balance and safety  OBJECTIVE:  Note: Objective measures were completed at Evaluation unless otherwise noted.  DIAGNOSTIC FINDINGS: 06/23/23 brain MRI MPRESSION: 1. Positive for a Recent Embolic Event, with scattered small subacute infarcts in the anterior and posterior circulation. Notable involvement of the left lateral medulla, the central pons. No associated hemorrhage or mass effect. 2. Underlying fairly severe chronic small vessel disease, including a small number of chronic microhemorrhages. COGNITION: Overall cognitive status: Within functional limits for tasks assessed   SENSATION: Patient reports numbness around her lips on the left side of her face.   POSTURE: flexed trunk   LOWER EXTREMITY ROM: WFL for activities assessed  UPPER EXTREMITY MMT:  MMT Right eval Left eval  Shoulder flexion 4/5 4/5  Shoulder extension    Shoulder abduction 3+/5 3+/5  Shoulder adduction    Shoulder extension    Shoulder internal rotation    Shoulder external rotation    Middle trapezius    Lower trapezius    Elbow flexion 4/5 4/5  Elbow extension 3+/5 3+/5  Wrist flexion    Wrist extension    Wrist ulnar deviation    Wrist radial deviation    Wrist pronation    Wrist supination    Grip strength 20 20   (Blank rows = not tested)   LOWER EXTREMITY MMT:    MMT Right Eval Left Eval  Hip flexion 3+/5 3+/5  Hip extension    Hip abduction    Hip adduction    Hip internal rotation    Hip external rotation    Knee flexion 5/5 4/5; slight calf pain  Knee extension 4/5 4+/5  Ankle dorsiflexion 4-/5 4-/5  Ankle plantarflexion    Ankle inversion    Ankle eversion    (Blank rows = not tested)  BED MOBILITY:  Not tested  TRANSFERS: Sit to stand: Modified independence  Assistive device utilized: Environmental consultant - 2 wheeled     Stand to sit: Modified independence  Assistive device utilized: Environmental consultant - 2 wheeled       GAIT: Findings: Gait Characteristics: step through pattern, decreased stride length, trunk flexed, poor foot clearance- Right, and poor foot clearance- Left, Assistive device utilized:Walker - 2 wheeled, and Level of assistance: Modified independence Gait speed: 0.43 m/s  FUNCTIONAL TESTS:  5 times sit to stand: 27.83 seconds  TREATMENT DATE:   10/17/2023                                    EXERCISE LOG  Exercise Repetitions and Resistance Comments  Nustep L3 x 15 mins    Seated Marching 2 min    LAQ 2 min x2   OH press w/med ball 2# 1x10    Seated chops 2x10 2# med ball  Core activation  Hip add/abd 2 min ea    Sit-stand 1x8  Upright Posture    Blank cell = exercise not performed today   10/15/2023                                   EXERCISE LOG  Exercise Repetitions and Resistance Comments  Nustep L3 x 15 mins   Seated Marching 1.5 min x 2   LAQ 2 min 5s hold   HS curl-seated Red tb 2x20 ea   Hip Abd- seated  3 min   Hip add iso  3 min   Smiles and frowns 20 ea  Red t- bar  Mid rows 15 ea   Alternating UE   Blank cell = exercise not performed today                                   10/10/23  EXERCISE LOG  Exercise Repetitions and Resistance Comments  Nustep  L3 x 15 minutes   Seated hip ADD isometric  3 minutes w/ 5 second hold   Rocker board (seated) 4 minutes    Seated marching  1 x 1 minute and 1 x 1.5 minutes Alternating LE  Seated clams  Red t-band x 3 minutes     Seated HS curl  Red t-band x 30 reps each     Blank cell = exercise not performed today   PATIENT EDUCATION: Education details: healing, prognosis Person educated: Patient Education method: Explanation Education comprehension: verbalized understanding  HOME EXERCISE PROGRAM:   GOALS: Goals reviewed with patient? Yes  SHORT TERM GOALS: Target date: 10/24/23  Patient will be independent with her  initial HEP.  Baseline: Goal status: INITIAL  2.  Patient will improve her gait speed to at least 0.6 m/s for improved functional mobility.  Baseline:  Goal status: INITIAL  3.  Patient will improve her five time sit to stand time to 20 seconds or less for improved lower extremity power.  Baseline:  Goal status: INITIAL  LONG TERM GOALS: Target date: 11/14/23  Patient will be independent with her advanced HEP.  Baseline:  Goal status: INITIAL  2.  Patient will improve her gait speed to at least 0.8 m/s for improved household mobility.  Baseline:  Goal status: INITIAL  3.  Patient will improve her five time sit to stand time to 15 seconds or less to reduce her fall risk.  Baseline:  Goal status: INITIAL  4.  Patient will be able to transfer from sitting to standing without upper extremity support for improved independence.  Baseline:  Goal status: INITIAL  ASSESSMENT:  CLINICAL IMPRESSION: Patient presents for visit 4 today stating they are a little out of breath and tired. Patient appears more withdrawn than last session, but participated appropriately. Patient progressed today with core activation exercises as well as  increasing LE strength training. Patient reports fatigue throughout session and breathing is labored throughout. Plan to progress patient to standing and transfer training as tolerated in future sessions. Patient would benefit from continue with skilled physical therapy to address her remaining impairments to maximize her functional mobility.  OBJECTIVE IMPAIRMENTS: Abnormal gait, decreased activity tolerance, decreased balance, decreased mobility, difficulty walking, decreased strength, and postural dysfunction.   ACTIVITY LIMITATIONS: standing, stairs, transfers, and locomotion level  PARTICIPATION LIMITATIONS: shopping and community activity  PERSONAL FACTORS: Age, Past/current experiences, Time since onset of injury/illness/exacerbation, Transportation, and  3+ comorbidities: HTN, atrial fibrillation, and history of a CVA, and allergies are also affecting patient's functional outcome.   REHAB POTENTIAL: Good  CLINICAL DECISION MAKING: Evolving/moderate complexity  EVALUATION COMPLEXITY: Moderate  PLAN:  PT FREQUENCY: 2x/week  PT DURATION: 6 weeks  PLANNED INTERVENTIONS: 97164- PT Re-evaluation, 97750- Physical Performance Testing, 97110-Therapeutic exercises, 97530- Therapeutic activity, 97112- Neuromuscular re-education, 97535- Self Care, [id number]- Manual therapy, [phone]- Gait training, Patient/Family education, Balance training, Stair training, Cryotherapy, and Moist heat  PLAN FOR NEXT SESSION: Nustep, lower extremity strengthening, gait training, and balance interventions   Estefana Jude, Student-PT 10/17/2023, 3:19 PM

## 2023-10-18 ENCOUNTER — Encounter

## 2023-10-22 ENCOUNTER — Ambulatory Visit

## 2023-10-22 DIAGNOSIS — Z9181 History of falling: Secondary | ICD-10-CM

## 2023-10-22 DIAGNOSIS — R2681 Unsteadiness on feet: Secondary | ICD-10-CM | POA: Diagnosis not present

## 2023-10-22 NOTE — Therapy (Signed)
 OUTPATIENT PHYSICAL THERAPY NEURO TREATMENT   Patient Name: Angela Dawson MRN: [id number] DOB:02/13/1946, 78 y.o., female Today's Date: 10/22/2023   PCP: Gladis Mustard, FNP REFERRING PROVIDER: Rosemarie Eather RAMAN, MD   END OF SESSION:  PT End of Session - 10/22/23 1437     Visit Number 5    Number of Visits 12    Date for PT Re-Evaluation 11/30/23    PT Start Time 1430    PT Stop Time 1516    PT Time Calculation (min) 46 min    Activity Tolerance Patient tolerated treatment well    Behavior During Therapy WFL for tasks assessed/performed           Past Medical History:  Diagnosis Date   Allergy    seasonal   Chicken pox    Complication of anesthesia    difficult to wake up after tubal ligations   Dyspnea    with exertion   Heart murmur    History of bronchitis    History of kidney stones    only one   Hypertension    Measles    Past Surgical History:  Procedure Laterality Date   AORTIC VALVE REPLACEMENT N/A 01/17/2016   Procedure: AORTIC VALVE REPLACEMENT (AVR) USING EDWARDS MAGNA EASE PERICARDIAL BIOPROSTHESIS VALVE;  Surgeon: Dallas KATHEE Jude, MD;  Location: [facility] OR;  Service: Open Heart Surgery;  Laterality: N/A;   CARDIAC CATHETERIZATION N/A 09/24/2015   Procedure: Right/Left Heart Cath and Coronary Angiography;  Surgeon: Candyce RAMAN Reek, MD;  Location: [facility] INVASIVE CV LAB;  Service: Cardiovascular;  Laterality: N/A;   COLONOSCOPY     REPLACEMENT ASCENDING AORTA N/A 01/17/2016   Procedure: REPLACEMENT ASCENDING AORTA USING HEMASHIELD PLATINUM WOVEN DOUBLE VELOUR VASCULAR GRAFT;  Surgeon: Dallas KATHEE Jude, MD;  Location: [facility] OR;  Service: Open Heart Surgery;  Laterality: N/A;   TEE WITHOUT CARDIOVERSION N/A 01/17/2016   Procedure: TRANSESOPHAGEAL ECHOCARDIOGRAM (TEE);  Surgeon: Dallas KATHEE Jude, MD;  Location: [facility] OR;  Service: Open Heart Surgery;  Laterality: N/A;   TUBAL LIGATION     Patient Active Problem List   Diagnosis Date Noted    Gout of right foot 07/02/2023   Unspecified protein-calorie malnutrition (HCC) 07/01/2023   Neurocognitive deficits 07/01/2023   History of CVA (cerebrovascular accident) 06/28/2023   Aortic atherosclerosis (HCC) 06/28/2023   Chronic nonseasonal allergic rhinitis due to pollen 06/28/2023   Acquired hypothyroidism 06/28/2023   New onset atrial fibrillation (HCC) 06/23/2023   Prolonged QT interval 06/23/2023   History of recent fall 06/23/2023   Falls 06/23/2023   Hypokalemia 06/23/2023   Hypomagnesemia 06/23/2023   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) 06/23/2023   B12 deficiency 06/23/2023   Statin intolerance 10/03/2019   S/P AVR (aortic valve replacement) 01/17/2016   Ascending aorta dilatation (HCC) 11/20/2013   Hyperlipidemia 09/30/2013   Severe obesity (BMI >= 40) (HCC) 09/30/2013   Essential hypertension 07/17/2012    ONSET DATE: May 2025  REFERRING DIAG: Cerebrovascular accident (CVA) due to embolism of precerebral artery, Gait disturbance, post-stroke   THERAPY DIAG:  Unsteadiness on feet  History of falling  Rationale for Evaluation and Treatment: Rehabilitation  SUBJECTIVE:  SUBJECTIVE STATEMENT: Pt reports 7/10 low back pain and 6/10 bil hip and knee pains today.    Pt accompanied by: self  PERTINENT HISTORY: HTN, atrial fibrillation, and history of a CVA, and allergies  PAIN:  Are you having pain? No  PRECAUTIONS: Fall  RED FLAGS: None   WEIGHT BEARING RESTRICTIONS: No  FALLS: Has patient fallen in last 6 months? Yes. Number of falls 2  LIVING ENVIRONMENT: Lives with: lives alone and has a caregiver with her most of the time Lives in: House/apartment Stairs: Yes: External: 1 steps; none Has following equipment at home: Walker - 2 wheeled  PLOF:  Independent  PATIENT GOALS: improved balance and safety  OBJECTIVE:  Note: Objective measures were completed at Evaluation unless otherwise noted.  DIAGNOSTIC FINDINGS: 06/23/23 brain MRI MPRESSION: 1. Positive for a Recent Embolic Event, with scattered small subacute infarcts in the anterior and posterior circulation. Notable involvement of the left lateral medulla, the central pons. No associated hemorrhage or mass effect. 2. Underlying fairly severe chronic small vessel disease, including a small number of chronic microhemorrhages. COGNITION: Overall cognitive status: Within functional limits for tasks assessed   SENSATION: Patient reports numbness around her lips on the left side of her face.   POSTURE: flexed trunk   LOWER EXTREMITY ROM: WFL for activities assessed  UPPER EXTREMITY MMT:  MMT Right eval Left eval  Shoulder flexion 4/5 4/5  Shoulder extension    Shoulder abduction 3+/5 3+/5  Shoulder adduction    Shoulder extension    Shoulder internal rotation    Shoulder external rotation    Middle trapezius    Lower trapezius    Elbow flexion 4/5 4/5  Elbow extension 3+/5 3+/5  Wrist flexion    Wrist extension    Wrist ulnar deviation    Wrist radial deviation    Wrist pronation    Wrist supination    Grip strength 20 20   (Blank rows = not tested)   LOWER EXTREMITY MMT:    MMT Right Eval Left Eval  Hip flexion 3+/5 3+/5  Hip extension    Hip abduction    Hip adduction    Hip internal rotation    Hip external rotation    Knee flexion 5/5 4/5; slight calf pain  Knee extension 4/5 4+/5  Ankle dorsiflexion 4-/5 4-/5  Ankle plantarflexion    Ankle inversion    Ankle eversion    (Blank rows = not tested)  BED MOBILITY:  Not tested  TRANSFERS: Sit to stand: Modified independence  Assistive device utilized: Environmental consultant - 2 wheeled     Stand to sit: Modified independence  Assistive device utilized: Environmental consultant - 2 wheeled      GAIT: Findings: Gait  Characteristics: step through pattern, decreased stride length, trunk flexed, poor foot clearance- Right, and poor foot clearance- Left, Assistive device utilized:Walker - 2 wheeled, and Level of assistance: Modified independence Gait speed: 0.43 m/s  FUNCTIONAL TESTS:  5 times sit to stand: 27.83 seconds  TREATMENT DATE:    10/22/2023                                   EXERCISE LOG  Exercise Repetitions and Resistance Comments  Nustep Lvl 3 x 15 mins    Seated Marching 2# x 2 min    LAQ 2# x 3 mins    OH press w/med ball 2# 1x12    Seated chops 2x10 2# med ball  Core activation  Hip add/abd 3 min ea    Sit-stand 19.24 seconds x 5 reps Upright Posture    Blank cell = exercise not performed today   10/15/2023                                   EXERCISE LOG  Exercise Repetitions and Resistance Comments  Nustep L3 x 15 mins   Seated Marching 1.5 min x 2   LAQ 2 min 5s hold   HS curl-seated Red tb 2x20 ea   Hip Abd- seated  3 min   Hip add iso  3 min   Smiles and frowns 20 ea  Red t- bar  Mid rows 15 ea   Alternating UE   Blank cell = exercise not performed today                                   10/10/23  EXERCISE LOG  Exercise Repetitions and Resistance Comments  Nustep  L3 x 15 minutes   Seated hip ADD isometric  3 minutes w/ 5 second hold   Rocker board (seated) 4 minutes    Seated marching  1 x 1 minute and 1 x 1.5 minutes Alternating LE  Seated clams  Red t-band x 3 minutes     Seated HS curl  Red t-band x 30 reps each     Blank cell = exercise not performed today   PATIENT EDUCATION: Education details: healing, prognosis Person educated: Patient Education method: Explanation Education comprehension: verbalized understanding  HOME EXERCISE PROGRAM:   GOALS: Goals reviewed with patient? Yes  SHORT TERM GOALS: Target date: 10/24/23  Patient will be independent with her  initial HEP.  Baseline: Goal status: MET  2.  Patient will improve her gait speed to at least 0.6 m/s for improved functional mobility.  Baseline: 8/18: .53 m/s Goal status: IN PROGRESS  3.  Patient will improve her five time sit to stand time to 20 seconds or less for improved lower extremity power.  Baseline: 8/18: 19.24 seconds Goal status: MET  LONG TERM GOALS: Target date: 11/14/23  Patient will be independent with her advanced HEP.  Baseline:  Goal status: IN PROGRESS  2.  Patient will improve her gait speed to at least 0.8 m/s for improved household mobility.  Baseline:  Goal status: IN PROGRESS  3.  Patient will improve her five time sit to stand time to 15 seconds or less to reduce her fall risk.  Baseline:  Goal status: IN PROGRESS  4.  Patient will be able to transfer from sitting to standing without upper extremity support for improved independence.  Baseline:  Goal status: IN PROGRESS  ASSESSMENT:  CLINICAL IMPRESSION: Pt arrives for today's treatment session reporting 7/10 back pain and 6/10 knee and hip pain.  Pt reports  that her MD stated that she has arthritis in all of her joints.  Pt able to perform 5 STS test in 19.24 seconds meeting her STG.  Pt states that she is performing her exercises at home while her son denies this.  Pt able to increase gait speed to .53 m/s, making good progress towards her STG.  PT able to tolerate increased resistance with seated LAQs and marches without discomfort.  Pt reported mild decrease in pain at completion of today's treatment session.  OBJECTIVE IMPAIRMENTS: Abnormal gait, decreased activity tolerance, decreased balance, decreased mobility, difficulty walking, decreased strength, and postural dysfunction.   ACTIVITY LIMITATIONS: standing, stairs, transfers, and locomotion level  PARTICIPATION LIMITATIONS: shopping and community activity  PERSONAL FACTORS: Age, Past/current experiences, Time since onset of  injury/illness/exacerbation, Transportation, and 3+ comorbidities: HTN, atrial fibrillation, and history of a CVA, and allergies are also affecting patient's functional outcome.   REHAB POTENTIAL: Good  CLINICAL DECISION MAKING: Evolving/moderate complexity  EVALUATION COMPLEXITY: Moderate  PLAN:  PT FREQUENCY: 2x/week  PT DURATION: 6 weeks  PLANNED INTERVENTIONS: 97164- PT Re-evaluation, 97750- Physical Performance Testing, 97110-Therapeutic exercises, 97530- Therapeutic activity, 97112- Neuromuscular re-education, 97535- Self Care, 02859- Manual therapy, [phone]- Gait training, Patient/Family education, Balance training, Stair training, Cryotherapy, and Moist heat  PLAN FOR NEXT SESSION: Nustep, lower extremity strengthening, gait training, and balance interventions   Delon DELENA Gosling, PTA 10/22/2023, 3:39 PM

## 2023-10-24 ENCOUNTER — Encounter

## 2023-10-29 ENCOUNTER — Ambulatory Visit

## 2023-10-29 DIAGNOSIS — R2681 Unsteadiness on feet: Secondary | ICD-10-CM

## 2023-10-29 DIAGNOSIS — Z9181 History of falling: Secondary | ICD-10-CM | POA: Diagnosis not present

## 2023-10-29 NOTE — Therapy (Signed)
 OUTPATIENT PHYSICAL THERAPY NEURO TREATMENT   Patient Name: Angela Dawson MRN: [id number] DOB:01-23-1946, 78 y.o., female Today's Date: 10/29/2023   PCP: Gladis Mustard, FNP REFERRING PROVIDER: Rosemarie Eather RAMAN, MD   END OF SESSION:  PT End of Session - 10/29/23 1434     Visit Number 6    Number of Visits 12    Date for PT Re-Evaluation 11/30/23    PT Start Time 1430    PT Stop Time 1513    PT Time Calculation (min) 43 min    Activity Tolerance Patient tolerated treatment well    Behavior During Therapy WFL for tasks assessed/performed            Past Medical History:  Diagnosis Date   Allergy    seasonal   Chicken pox    Complication of anesthesia    difficult to wake up after tubal ligations   Dyspnea    with exertion   Heart murmur    History of bronchitis    History of kidney stones    only one   Hypertension    Measles    Past Surgical History:  Procedure Laterality Date   AORTIC VALVE REPLACEMENT N/A 01/17/2016   Procedure: AORTIC VALVE REPLACEMENT (AVR) USING EDWARDS MAGNA EASE PERICARDIAL BIOPROSTHESIS VALVE;  Surgeon: Dallas KATHEE Jude, MD;  Location: [facility] OR;  Service: Open Heart Surgery;  Laterality: N/A;   CARDIAC CATHETERIZATION N/A 09/24/2015   Procedure: Right/Left Heart Cath and Coronary Angiography;  Surgeon: Candyce RAMAN Reek, MD;  Location: [facility] INVASIVE CV LAB;  Service: Cardiovascular;  Laterality: N/A;   COLONOSCOPY     REPLACEMENT ASCENDING AORTA N/A 01/17/2016   Procedure: REPLACEMENT ASCENDING AORTA USING HEMASHIELD PLATINUM WOVEN DOUBLE VELOUR VASCULAR GRAFT;  Surgeon: Dallas KATHEE Jude, MD;  Location: [facility] OR;  Service: Open Heart Surgery;  Laterality: N/A;   TEE WITHOUT CARDIOVERSION N/A 01/17/2016   Procedure: TRANSESOPHAGEAL ECHOCARDIOGRAM (TEE);  Surgeon: Dallas KATHEE Jude, MD;  Location: [facility] OR;  Service: Open Heart Surgery;  Laterality: N/A;   TUBAL LIGATION     Patient Active Problem List   Diagnosis Date Noted    Gout of right foot 07/02/2023   Unspecified protein-calorie malnutrition (HCC) 07/01/2023   Neurocognitive deficits 07/01/2023   History of CVA (cerebrovascular accident) 06/28/2023   Aortic atherosclerosis (HCC) 06/28/2023   Chronic nonseasonal allergic rhinitis due to pollen 06/28/2023   Acquired hypothyroidism 06/28/2023   New onset atrial fibrillation (HCC) 06/23/2023   Prolonged QT interval 06/23/2023   History of recent fall 06/23/2023   Falls 06/23/2023   Hypokalemia 06/23/2023   Hypomagnesemia 06/23/2023   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) 06/23/2023   B12 deficiency 06/23/2023   Statin intolerance 10/03/2019   S/P AVR (aortic valve replacement) 01/17/2016   Ascending aorta dilatation (HCC) 11/20/2013   Hyperlipidemia 09/30/2013   Severe obesity (BMI >= 40) (HCC) 09/30/2013   Essential hypertension 07/17/2012    ONSET DATE: May 2025  REFERRING DIAG: Cerebrovascular accident (CVA) due to embolism of precerebral artery, Gait disturbance, post-stroke   THERAPY DIAG:  Unsteadiness on feet  History of falling  Rationale for Evaluation and Treatment: Rehabilitation  SUBJECTIVE:  SUBJECTIVE STATEMENT: Patient reports that she feels fine today.    Pt accompanied by: self  PERTINENT HISTORY: HTN, atrial fibrillation, and history of a CVA, and allergies  PAIN:  Are you having pain? No  PRECAUTIONS: Fall  RED FLAGS: None   WEIGHT BEARING RESTRICTIONS: No  FALLS: Has patient fallen in last 6 months? Yes. Number of falls 2  LIVING ENVIRONMENT: Lives with: lives alone and has a caregiver with her most of the time Lives in: House/apartment Stairs: Yes: External: 1 steps; none Has following equipment at home: Walker - 2 wheeled  PLOF: Independent  PATIENT  GOALS: improved balance and safety  OBJECTIVE:  Note: Objective measures were completed at Evaluation unless otherwise noted.  DIAGNOSTIC FINDINGS: 06/23/23 brain MRI MPRESSION: 1. Positive for a Recent Embolic Event, with scattered small subacute infarcts in the anterior and posterior circulation. Notable involvement of the left lateral medulla, the central pons. No associated hemorrhage or mass effect. 2. Underlying fairly severe chronic small vessel disease, including a small number of chronic microhemorrhages. COGNITION: Overall cognitive status: Within functional limits for tasks assessed   SENSATION: Patient reports numbness around her lips on the left side of her face.   POSTURE: flexed trunk   LOWER EXTREMITY ROM: WFL for activities assessed  UPPER EXTREMITY MMT:  MMT Right eval Left eval  Shoulder flexion 4/5 4/5  Shoulder extension    Shoulder abduction 3+/5 3+/5  Shoulder adduction    Shoulder extension    Shoulder internal rotation    Shoulder external rotation    Middle trapezius    Lower trapezius    Elbow flexion 4/5 4/5  Elbow extension 3+/5 3+/5  Wrist flexion    Wrist extension    Wrist ulnar deviation    Wrist radial deviation    Wrist pronation    Wrist supination    Grip strength 20 20   (Blank rows = not tested)   LOWER EXTREMITY MMT:    MMT Right Eval Left Eval  Hip flexion 3+/5 3+/5  Hip extension    Hip abduction    Hip adduction    Hip internal rotation    Hip external rotation    Knee flexion 5/5 4/5; slight calf pain  Knee extension 4/5 4+/5  Ankle dorsiflexion 4-/5 4-/5  Ankle plantarflexion    Ankle inversion    Ankle eversion    (Blank rows = not tested)  BED MOBILITY:  Not tested  TRANSFERS: Sit to stand: Modified independence  Assistive device utilized: Environmental consultant - 2 wheeled     Stand to sit: Modified independence  Assistive device utilized: Environmental consultant - 2 wheeled      GAIT: Findings: Gait Characteristics: step through  pattern, decreased stride length, trunk flexed, poor foot clearance- Right, and poor foot clearance- Left, Assistive device utilized:Walker - 2 wheeled, and Level of assistance: Modified independence Gait speed: 0.43 m/s  FUNCTIONAL TESTS:  5 times sit to stand: 27.83 seconds  TREATMENT DATE:                                     10/29/23 EXERCISE LOG  Exercise Repetitions and Resistance Comments  Nustep  L3 x 18 minutes    Toe taps on step   6 step x 15 reps each  BUE support from parallel bars for safety   Static stance on foam   2 minutes  BUE support from parallel bars for safety   Standing gastroc stretch  2 minutes  BUE support from parallel bars for safety   Standing heel raise  20 reps  BUE support from parallel bars for safety   Seated marching  2# x 25 reps each    LAQ 2# x 25 reps each    Sit to stand  12 reps  With UE support from armrests   Seated hip ADD isometric  3 minutes w/ 5 second hold    Blank cell = exercise not performed today   10/22/2023                                   EXERCISE LOG  Exercise Repetitions and Resistance Comments  Nustep Lvl 3 x 15 mins    Seated Marching 2# x 2 min    LAQ 2# x 3 mins    OH press w/med ball 2# 1x12    Seated chops 2x10 2# med ball  Core activation  Hip add/abd 3 min ea    Sit-stand 19.24 seconds x 5 reps Upright Posture    Blank cell = exercise not performed today   10/15/2023                                   EXERCISE LOG  Exercise Repetitions and Resistance Comments  Nustep L3 x 15 mins   Seated Marching 1.5 min x 2   LAQ 2 min 5s hold   HS curl-seated Red tb 2x20 ea   Hip Abd- seated  3 min   Hip add iso  3 min   Smiles and frowns 20 ea  Red t- bar  Mid rows 15 ea   Alternating UE   Blank cell = exercise not performed today   PATIENT EDUCATION: Education details:  Person educated: Patient Education method:  Explanation Education comprehension: verbalized understanding  HOME EXERCISE PROGRAM:   GOALS: Goals reviewed with patient? Yes  SHORT TERM GOALS: Target date: 10/24/23  Patient will be independent with her initial HEP.  Baseline: Goal status: MET  2.  Patient will improve her gait speed to at least 0.6 m/s for improved functional mobility.  Baseline: 8/18: .53 m/s Goal status: IN PROGRESS  3.  Patient will improve her five time sit to stand time to 20 seconds or less for improved lower extremity power.  Baseline: 8/18: 19.24 seconds Goal status: MET  LONG TERM GOALS: Target date: 11/14/23  Patient will be independent with her advanced HEP.  Baseline:  Goal status: IN PROGRESS  2.  Patient will improve her gait speed to at least 0.8 m/s for improved household mobility.  Baseline:  Goal status: IN PROGRESS  3.  Patient will improve her five time sit to stand time to 15 seconds or less to reduce her fall risk.  Baseline:  Goal status: IN PROGRESS  4.  Patient will be able to transfer from sitting to standing without upper extremity support for improved independence.  Baseline:  Goal status: IN PROGRESS  ASSESSMENT:  CLINICAL IMPRESSION: Patient was introduced to new standing interventions to facilitate improved safety and foot clearance to reduce her risk of falling. She required minimal cueing with static stance on the airex to promote upright stance to improve her awareness of her surroundings. She reported feeling alright upon the conclusion of treatment. She continues to require skilled physical therapy to address her remaining impairments to maximize her safety and functional mobility.    OBJECTIVE IMPAIRMENTS: Abnormal gait, decreased activity tolerance, decreased balance, decreased mobility, difficulty walking, decreased strength, and postural dysfunction.   ACTIVITY LIMITATIONS: standing, stairs, transfers, and locomotion level  PARTICIPATION LIMITATIONS:  shopping and community activity  PERSONAL FACTORS: Age, Past/current experiences, Time since onset of injury/illness/exacerbation, Transportation, and 3+ comorbidities: HTN, atrial fibrillation, and history of a CVA, and allergies are also affecting patient's functional outcome.   REHAB POTENTIAL: Good  CLINICAL DECISION MAKING: Evolving/moderate complexity  EVALUATION COMPLEXITY: Moderate  PLAN:  PT FREQUENCY: 2x/week  PT DURATION: 6 weeks  PLANNED INTERVENTIONS: 97164- PT Re-evaluation, 97750- Physical Performance Testing, 97110-Therapeutic exercises, 97530- Therapeutic activity, 97112- Neuromuscular re-education, 97535- Self Care, [id number]- Manual therapy, [phone]- Gait training, Patient/Family education, Balance training, Stair training, Cryotherapy, and Moist heat  PLAN FOR NEXT SESSION: Nustep, lower extremity strengthening, gait training, and balance interventions   Lacinda JAYSON Fass, PT 10/29/2023, 3:13 PM

## 2023-11-01 ENCOUNTER — Ambulatory Visit

## 2023-11-01 DIAGNOSIS — Z9181 History of falling: Secondary | ICD-10-CM

## 2023-11-01 DIAGNOSIS — R2681 Unsteadiness on feet: Secondary | ICD-10-CM | POA: Diagnosis not present

## 2023-11-01 NOTE — Therapy (Signed)
 OUTPATIENT PHYSICAL THERAPY NEURO TREATMENT   Patient Name: Angela Dawson MRN: [id number] DOB:18-Mar-1945, 78 y.o., female Today's Date: 11/01/2023   PCP: Gladis Mustard, FNP REFERRING PROVIDER: Rosemarie Eather RAMAN, MD   END OF SESSION:  PT End of Session - 11/01/23 1412     Visit Number 7    Number of Visits 12    Date for PT Re-Evaluation 11/30/23    PT Start Time 1408    PT Stop Time 1456    PT Time Calculation (min) 48 min    Activity Tolerance Patient tolerated treatment well    Behavior During Therapy [facility] for tasks assessed/performed            Past Medical History:  Diagnosis Date   Allergy    seasonal   Chicken pox    Complication of anesthesia    difficult to wake up after tubal ligations   Dyspnea    with exertion   Heart murmur    History of bronchitis    History of kidney stones    only one   Hypertension    Measles    Past Surgical History:  Procedure Laterality Date   AORTIC VALVE REPLACEMENT N/A 01/17/2016   Procedure: AORTIC VALVE REPLACEMENT (AVR) USING EDWARDS MAGNA EASE PERICARDIAL BIOPROSTHESIS VALVE;  Surgeon: Dallas KATHEE Jude, MD;  Location: [facility] OR;  Service: Open Heart Surgery;  Laterality: N/A;   CARDIAC CATHETERIZATION N/A 09/24/2015   Procedure: Right/Left Heart Cath and Coronary Angiography;  Surgeon: Candyce RAMAN Reek, MD;  Location: [facility] INVASIVE CV LAB;  Service: Cardiovascular;  Laterality: N/A;   COLONOSCOPY     REPLACEMENT ASCENDING AORTA N/A 01/17/2016   Procedure: REPLACEMENT ASCENDING AORTA USING HEMASHIELD PLATINUM WOVEN DOUBLE VELOUR VASCULAR GRAFT;  Surgeon: Dallas KATHEE Jude, MD;  Location: [facility] OR;  Service: Open Heart Surgery;  Laterality: N/A;   TEE WITHOUT CARDIOVERSION N/A 01/17/2016   Procedure: TRANSESOPHAGEAL ECHOCARDIOGRAM (TEE);  Surgeon: Dallas KATHEE Jude, MD;  Location: [facility] OR;  Service: Open Heart Surgery;  Laterality: N/A;   TUBAL LIGATION     Patient Active Problem List   Diagnosis Date Noted    Gout of right foot 07/02/2023   Unspecified protein-calorie malnutrition (HCC) 07/01/2023   Neurocognitive deficits 07/01/2023   History of CVA (cerebrovascular accident) 06/28/2023   Aortic atherosclerosis (HCC) 06/28/2023   Chronic nonseasonal allergic rhinitis due to pollen 06/28/2023   Acquired hypothyroidism 06/28/2023   New onset atrial fibrillation (HCC) 06/23/2023   Prolonged QT interval 06/23/2023   History of recent fall 06/23/2023   Falls 06/23/2023   Hypokalemia 06/23/2023   Hypomagnesemia 06/23/2023   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) 06/23/2023   B12 deficiency 06/23/2023   Statin intolerance 10/03/2019   S/P AVR (aortic valve replacement) 01/17/2016   Ascending aorta dilatation (HCC) 11/20/2013   Hyperlipidemia 09/30/2013   Severe obesity (BMI >= 40) (HCC) 09/30/2013   Essential hypertension 07/17/2012    ONSET DATE: May 2025  REFERRING DIAG: Cerebrovascular accident (CVA) due to embolism of precerebral artery, Gait disturbance, post-stroke   THERAPY DIAG:  Unsteadiness on feet  History of falling  Rationale for Evaluation and Treatment: Rehabilitation  SUBJECTIVE:  SUBJECTIVE STATEMENT: Pt reports 4/10 bil hip pain.    Pt accompanied by: self  PERTINENT HISTORY: HTN, atrial fibrillation, and history of a CVA, and allergies  PAIN:  Are you having pain? Yes: NPRS scale: 4/10 Pain location: bil hips  PRECAUTIONS: Fall  RED FLAGS: None   WEIGHT BEARING RESTRICTIONS: No  FALLS: Has patient fallen in last 6 months? Yes. Number of falls 2  LIVING ENVIRONMENT: Lives with: lives alone and has a caregiver with her most of the time Lives in: House/apartment Stairs: Yes: External: 1 steps; none Has following equipment at home: Walker - 2  wheeled  PLOF: Independent  PATIENT GOALS: improved balance and safety  OBJECTIVE:  Note: Objective measures were completed at Evaluation unless otherwise noted.  DIAGNOSTIC FINDINGS: 06/23/23 brain MRI MPRESSION: 1. Positive for a Recent Embolic Event, with scattered small subacute infarcts in the anterior and posterior circulation. Notable involvement of the left lateral medulla, the central pons. No associated hemorrhage or mass effect. 2. Underlying fairly severe chronic small vessel disease, including a small number of chronic microhemorrhages. COGNITION: Overall cognitive status: Within functional limits for tasks assessed   SENSATION: Patient reports numbness around her lips on the left side of her face.   POSTURE: flexed trunk   LOWER EXTREMITY ROM: WFL for activities assessed  UPPER EXTREMITY MMT:  MMT Right eval Left eval  Shoulder flexion 4/5 4/5  Shoulder extension    Shoulder abduction 3+/5 3+/5  Shoulder adduction    Shoulder extension    Shoulder internal rotation    Shoulder external rotation    Middle trapezius    Lower trapezius    Elbow flexion 4/5 4/5  Elbow extension 3+/5 3+/5  Wrist flexion    Wrist extension    Wrist ulnar deviation    Wrist radial deviation    Wrist pronation    Wrist supination    Grip strength 20 20   (Blank rows = not tested)   LOWER EXTREMITY MMT:    MMT Right Eval Left Eval  Hip flexion 3+/5 3+/5  Hip extension    Hip abduction    Hip adduction    Hip internal rotation    Hip external rotation    Knee flexion 5/5 4/5; slight calf pain  Knee extension 4/5 4+/5  Ankle dorsiflexion 4-/5 4-/5  Ankle plantarflexion    Ankle inversion    Ankle eversion    (Blank rows = not tested)  BED MOBILITY:  Not tested  TRANSFERS: Sit to stand: Modified independence  Assistive device utilized: Environmental consultant - 2 wheeled     Stand to sit: Modified independence  Assistive device utilized: Environmental consultant - 2 wheeled       GAIT: Findings: Gait Characteristics: step through pattern, decreased stride length, trunk flexed, poor foot clearance- Right, and poor foot clearance- Left, Assistive device utilized:Walker - 2 wheeled, and Level of assistance: Modified independence Gait speed: 0.43 m/s  FUNCTIONAL TESTS:  5 times sit to stand: 27.83 seconds  TREATMENT DATE:    11/01/23    EXERCISE LOG  Exercise Repetitions and Resistance Comments  Nustep  L3 x 16 minutes    Toe taps on step   6 step x 15 reps each  BUE support from parallel bars for safety   Side-stepping Airex x 4 mins   Tandem Gait Airex x 4 mins   Standing Marches  Airex x 2.5 mins BUE support from parallel bars for safety   Standing gastroc stretch   BUE support from parallel bars for safety   Standing heel raise   BUE support from parallel bars for safety   Seated marching  2# x 2.5 mins   LAQ 2# x 25 reps each    Seated ham Curls Red x 20 reps bil13   Sit to stand  12 reps  With UE support from armrests   Seated hip ADD isometric  3 minutes w/ 5 second hold    Blank cell = exercise not performed today   10/22/2023                                   EXERCISE LOG  Exercise Repetitions and Resistance Comments  Nustep Lvl 3 x 15 mins    Seated Marching 2# x 2 min    LAQ 2# x 3 mins    OH press w/med ball 2# 1x12    Seated chops 2x10 2# med ball  Core activation  Hip add/abd 3 min ea    Sit-stand 19.24 seconds x 5 reps Upright Posture    Blank cell = exercise not performed today   10/15/2023                                   EXERCISE LOG  Exercise Repetitions and Resistance Comments  Nustep L3 x 15 mins   Seated Marching 1.5 min x 2   LAQ 2 min 5s hold   HS curl-seated Red tb 2x20 ea   Hip Abd- seated  3 min   Hip add iso  3 min   Smiles and frowns 20 ea  Red t- bar  Mid rows 15 ea   Alternating UE   Blank cell = exercise not performed  today   PATIENT EDUCATION: Education details:  Person educated: Patient Education method: Explanation Education comprehension: verbalized understanding  HOME EXERCISE PROGRAM:   GOALS: Goals reviewed with patient? Yes  SHORT TERM GOALS: Target date: 10/24/23  Patient will be independent with her initial HEP.  Baseline: Goal status: MET  2.  Patient will improve her gait speed to at least 0.6 m/s for improved functional mobility.  Baseline: 8/18: .53 m/s Goal status: IN PROGRESS  3.  Patient will improve her five time sit to stand time to 20 seconds or less for improved lower extremity power.  Baseline: 8/18: 19.24 seconds Goal status: MET  LONG TERM GOALS: Target date: 11/14/23  Patient will be independent with her advanced HEP.  Baseline:  Goal status: IN PROGRESS  2.  Patient will improve her gait speed to at least 0.8 m/s for improved household mobility.  Baseline:  Goal status: IN PROGRESS  3.  Patient will improve her five time sit to stand time to 15 seconds or less to reduce her fall risk.  Baseline:  Goal status: IN PROGRESS  4.  Patient will be  able to transfer from sitting to standing without upper extremity support for improved independence.  Baseline:  Goal status: IN PROGRESS  ASSESSMENT:  CLINICAL IMPRESSION: Pt arrives for today's treatment session reporting 4/10 bil hip pain.  Pt introduced to balance activities on the Airex balance beam today.  Pt requiring BUE support with all balance activities for safety and stability.  Pt able to tolerate increased reps with all seated exercises today with min cues for proper technique and posture.  Pt reported decreased hip pain at completion of today's treatment session.    OBJECTIVE IMPAIRMENTS: Abnormal gait, decreased activity tolerance, decreased balance, decreased mobility, difficulty walking, decreased strength, and postural dysfunction.   ACTIVITY LIMITATIONS: standing, stairs, transfers, and locomotion  level  PARTICIPATION LIMITATIONS: shopping and community activity  PERSONAL FACTORS: Age, Past/current experiences, Time since onset of injury/illness/exacerbation, Transportation, and 3+ comorbidities: HTN, atrial fibrillation, and history of a CVA, and allergies are also affecting patient's functional outcome.   REHAB POTENTIAL: Good  CLINICAL DECISION MAKING: Evolving/moderate complexity  EVALUATION COMPLEXITY: Moderate  PLAN:  PT FREQUENCY: 2x/week  PT DURATION: 6 weeks  PLANNED INTERVENTIONS: 97164- PT Re-evaluation, 97750- Physical Performance Testing, 97110-Therapeutic exercises, 97530- Therapeutic activity, 97112- Neuromuscular re-education, 97535- Self Care, 02859- Manual therapy, [phone]- Gait training, Patient/Family education, Balance training, Stair training, Cryotherapy, and Moist heat  PLAN FOR NEXT SESSION: Nustep, lower extremity strengthening, gait training, and balance interventions   Delon DELENA Gosling, PTA 11/01/2023, 3:01 PM

## 2023-11-08 ENCOUNTER — Ambulatory Visit: Admitting: *Deleted

## 2023-11-08 ENCOUNTER — Encounter: Payer: Self-pay | Admitting: Nurse Practitioner

## 2023-11-08 ENCOUNTER — Ambulatory Visit: Admitting: Nurse Practitioner

## 2023-11-08 VITALS — BP 174/72 | HR 68 | Temp 98.1°F | Ht <= 58 in | Wt 224.0 lb

## 2023-11-08 DIAGNOSIS — E039 Hypothyroidism, unspecified: Secondary | ICD-10-CM | POA: Diagnosis not present

## 2023-11-08 DIAGNOSIS — Z23 Encounter for immunization: Secondary | ICD-10-CM

## 2023-11-08 DIAGNOSIS — E782 Mixed hyperlipidemia: Secondary | ICD-10-CM

## 2023-11-08 DIAGNOSIS — I10 Essential (primary) hypertension: Secondary | ICD-10-CM | POA: Diagnosis not present

## 2023-11-08 DIAGNOSIS — Z952 Presence of prosthetic heart valve: Secondary | ICD-10-CM | POA: Diagnosis not present

## 2023-11-08 LAB — LIPID PANEL

## 2023-11-08 MED ORDER — LISINOPRIL 40 MG PO TABS: 40.0000 mg | ORAL_TABLET | Freq: Every day | ORAL | 1 refills | Status: AC

## 2023-11-08 MED ORDER — METOPROLOL TARTRATE 25 MG PO TABS: 25.0000 mg | ORAL_TABLET | Freq: Two times a day (BID) | ORAL | 5 refills | Status: AC

## 2023-11-08 MED ORDER — APIXABAN 5 MG PO TABS: 5.0000 mg | ORAL_TABLET | Freq: Two times a day (BID) | ORAL | 1 refills | Status: AC

## 2023-11-08 NOTE — Progress Notes (Signed)
 Subjective:    Patient ID: Angela Dawson, female    DOB: 09-08-1945, 78 y.o.   MRN: [id number]    Chief Complaint: medical management of chronic issues     HPI:  Angela Dawson is a 78 y.o. who identifies as a female who was assigned female at birth.   Social history: Lives with: by herself- family and friends check on her daily Work history: retired from [employer]   Comes in today for follow up of the following chronic medical issues:  1. Primary hypertension No c/o chest pain, sob or headache. Does check blood pressure at home. Runs 120-130's systolic. BP Readings from Last 3 Encounters:  09/25/23 (!) 160/82  08/02/23 (!) 166/78  07/11/23 (!) 112/59     2. Mixed hyperlipidemia Does not really watch diet and does no dedicated exercise Lab Results  Component Value Date   CHOL 169 06/23/2023   HDL 40 (L) 06/23/2023   LDLCALC 106 (H) 06/23/2023   TRIG 114 06/23/2023   CHOLHDL 4.2 06/23/2023     3. Ascending aorta dilatation (HCC) Had sleeve surgery in 2018. Has not had follow up scan since 2018.  4. hypothyroidism Lab Results  Component Value Date   TSH 5.027 (H) 06/23/2023     5. Severe obesity (BMI >= 40) (HCC) Weight is down 14 lbs Wt Readings from Last 3 Encounters:  09/25/23 224 lb (101.6 kg)  08/02/23 210 lb (95.3 kg)  07/11/23 208 lb 12.8 oz (94.7 kg)   BMI Readings from Last 3 Encounters:  09/25/23 46.82 kg/m  08/02/23 43.89 kg/m  07/11/23 43.64 kg/m      New complaints: None today  Allergies  Allergen Reactions   Lipitor [Atorvastatin ] Other (See Comments)    Unable to stand or move   Allopurinol  Diarrhea and Nausea And Vomiting    Pt reported this and colchicine  both caused a lot of stomach issues   Colchicine  Diarrhea and Nausea And Vomiting    Pt reported this and allopurinol  both caused a lot of stomach issues   Meloxicam  Other (See Comments)    Made patient dizzy and caused her heart to race but can take a  half tablet   Statins Other (See Comments)    Leg cramps    Crestor  [Rosuvastatin ] Other (See Comments)    Dizziness   Penicillins Rash    Immediate rash, facial/tongue/throat swelling, SOB or lightheadedness with hypotension   Outpatient Encounter Medications as of 11/08/2023  Medication Sig   acetaminophen  (TYLENOL ) 325 MG tablet Take 2 tablets (650 mg total) by mouth every 6 (six) hours as needed for mild pain.   apixaban  (ELIQUIS ) 5 MG TABS tablet Take 1 tablet (5 mg total) by mouth 2 (two) times daily.   Ascorbic Acid (VITAMIN C PO) Take 1 tablet by mouth daily. 500 mg   Calcium  Carb-Cholecalciferol  600-800 MG-UNIT TABS Take 1 tablet by mouth daily.   Cholecalciferol  (VITAMIN D ) 2000 units CAPS Take 2,000 Units by mouth daily.   cyanocobalamin  1000 MCG tablet Take 1,000 mcg by mouth daily.   feeding supplement (ENSURE ENLIVE / ENSURE PLUS) LIQD Take 237 mLs by mouth 2 (two) times daily between meals.   loratadine  (CLARITIN ) 10 MG tablet Take 10 mg by mouth daily.   meclizine  (ANTIVERT ) 12.5 MG tablet Take 1 tablet (12.5 mg total) by mouth every 6 (six) hours as needed for dizziness.   metoprolol  tartrate (LOPRESSOR ) 25 MG tablet Take 1 tablet (25 mg total) by mouth  2 (two) times daily.   rosuvastatin  (CRESTOR ) 5 MG tablet TAKE 1 TABLET BY MOUTH THREE TIMES A WEEK   No facility-administered encounter medications on file as of 11/08/2023.    Past Surgical History:  Procedure Laterality Date   AORTIC VALVE REPLACEMENT N/A 01/17/2016   Procedure: AORTIC VALVE REPLACEMENT (AVR) USING EDWARDS MAGNA EASE PERICARDIAL BIOPROSTHESIS VALVE;  Surgeon: Dallas KATHEE Jude, MD;  Location: [facility] OR;  Service: Open Heart Surgery;  Laterality: N/A;   CARDIAC CATHETERIZATION N/A 09/24/2015   Procedure: Right/Left Heart Cath and Coronary Angiography;  Surgeon: Candyce GORMAN Reek, MD;  Location: [facility] INVASIVE CV LAB;  Service: Cardiovascular;  Laterality: N/A;   COLONOSCOPY     REPLACEMENT ASCENDING AORTA  N/A 01/17/2016   Procedure: REPLACEMENT ASCENDING AORTA USING HEMASHIELD PLATINUM WOVEN DOUBLE VELOUR VASCULAR GRAFT;  Surgeon: Dallas KATHEE Jude, MD;  Location: [facility] OR;  Service: Open Heart Surgery;  Laterality: N/A;   TEE WITHOUT CARDIOVERSION N/A 01/17/2016   Procedure: TRANSESOPHAGEAL ECHOCARDIOGRAM (TEE);  Surgeon: Dallas KATHEE Jude, MD;  Location: [facility] OR;  Service: Open Heart Surgery;  Laterality: N/A;   TUBAL LIGATION      Family History  Problem Relation Age of Onset   Alzheimer's disease Mother    Heart disease Mother    Heart attack Father 62   Post-traumatic stress disorder Brother    Breast cancer Maternal Aunt    Heart attack Son    Other Son        5 back surgeries   Stroke Neg Hx       Controlled substance contract: n/a      Review of Systems  Constitutional:  Negative for diaphoresis.  Eyes:  Negative for pain.  Respiratory:  Negative for shortness of breath.   Cardiovascular:  Negative for chest pain, palpitations and leg swelling.  Gastrointestinal:  Negative for abdominal pain.  Endocrine: Negative for polydipsia.  Skin:  Negative for rash.  Neurological:  Negative for dizziness, weakness and headaches.  Hematological:  Does not bruise/bleed easily.  All other systems reviewed and are negative.      Objective:   Physical Exam Vitals and nursing note reviewed.  Constitutional:      General: She is not in acute distress.    Appearance: Normal appearance. She is well-developed.  HENT:     Head: Normocephalic.     Right Ear: Tympanic membrane normal.     Left Ear: Tympanic membrane normal.     Nose: Nose normal.     Mouth/Throat:     Mouth: Mucous membranes are moist.  Eyes:     Pupils: Pupils are equal, round, and reactive to light.  Neck:     Vascular: No carotid bruit or JVD.  Cardiovascular:     Rate and Rhythm: Normal rate and regular rhythm.     Heart sounds: Normal heart sounds.  Pulmonary:     Effort: Pulmonary effort is normal.  No respiratory distress.     Breath sounds: Normal breath sounds. No wheezing or rales.  Chest:     Chest wall: No tenderness.  Abdominal:     General: Bowel sounds are normal. There is no distension or abdominal bruit.     Palpations: Abdomen is soft. There is no hepatomegaly, splenomegaly, mass or pulsatile mass.     Tenderness: There is no abdominal tenderness.  Musculoskeletal:        General: Normal range of motion.     Cervical back: Normal range of motion and  neck supple.  Lymphadenopathy:     Cervical: No cervical adenopathy.  Skin:    General: Skin is warm and dry.     Comments: Bluish discoloration to left posterior thigh area. Nontender, no drainage  Neurological:     Mental Status: She is alert and oriented to person, place, and time.     Deep Tendon Reflexes: Reflexes are normal and symmetric.  Psychiatric:        Behavior: Behavior normal.        Thought Content: Thought content normal.        Judgment: Judgment normal.    BP (!) 174/72   Pulse 68   Temp 98.1 F (36.7 C) (Temporal)   Ht 4' 10 (1.473 m)   Wt 224 lb (101.6 kg)   SpO2 92%   BMI 46.82 kg/m          Assessment & Plan:   TAHJA LIAO comes in today with chief complaint of Medical Management of Chronic Issues   Diagnosis and orders addressed:  1. Essential hypertension (Primary) Low sodium diet Keep diary of blood pressure at home - lisinopril  (ZESTRIL ) 40 MG tablet; Take 1 tablet (40 mg total) by mouth daily.  Dispense: 90 tablet; Refill: 1 - CBC with Differential/Platelet - CMP14+EGFR  2. Mixed hyperlipidemia Low fat diet - Lipid panel  3. S/P AVR (aortic valve replacement) Keep follow up with cardiology - metoprolol  tartrate (LOPRESSOR ) 25 MG tablet; Take 1 tablet (25 mg total) by mouth 2 (two) times daily.  Dispense: 60 tablet; Refill: 5  4. Acquired hypothyroidism Labs pending  5. Severe obesity (BMI >= 40) (HCC) Discussed diet and exercise for person with BMI >25 Will  recheck weight in 3-6 months    Labs pending Health Maintenance reviewed Diet and exercise encouraged  Follow up plan: 6 months   Mary-Margaret Gladis, FNP

## 2023-11-08 NOTE — Patient Instructions (Signed)
Preventing Pressure Injuries  A pressure injury, also called a pressure ulcer or bedsore, is an injury to the skin and the tissue under the skin that is caused by pressure. It can happen when your skin presses against a surface, such as a mattress or the seat of a wheelchair, for too long. The pressure on the blood vessels causes reduced blood flow to your skin. Over time, this can make the tissue die and break down, causing a wound. Pressure injuries often occur: Over bony parts of the body, such as the tailbone, shoulders, elbows, hips, heels, spine, ankles, and back of the head. Under medical devices, such as stockings, equipment to help with breathing, tubes, and splints. Inside the mouth or nose from dentures or tubes. How can pressure injuries affect me? Pressure injuries are caused by a lack of blood supply to an area of the skin. These injuries begin as a red or dark area on the skin and can become an open sore. They can come from a lot of pressure on the skin over a short period of time or from less pressure over a long period of time. Pressure injuries may be mild, moderate, or severe. They can cause pain, damage to your muscles, and infection. What can increase my risk for pressure injuries? You are more likely to develop this condition if: You are in the hospital or an extended care facility. You are bedridden or in a wheelchair. You have an injury or disease that keeps you from: Moving like normal. Feeling pain or pressure. Telling someone if you feel pain or pressure. You have a condition that: Makes you sleepy or less alert. Causes poor blood flow. You need to wear a medical device. You have poor control of your bladder or bowel movements (incontinence). You are not getting enough fluid or nutrients (malnutrition). You have had this condition before. What actions can I take to prevent pressure injuries? Reducing pressure Do not lie or sit in one position for a long time.  Move or change position as often as told by your health care provider. You may need to move: Every hour when out of bed in a chair. Every 2 hours when in bed. Use pillows or cushions to reduce pressure on certain areas of the body. Ask your health care provider to recommend a mattress, mattress cover, cushions, pads, or heel protectors. These may include products filled with air, foam, gel, or sand. Use medical devices that do not rub your skin. Tell your health care provider if one of your medical devices is causing pain or irritation. Skin care in the hospital If you are in the hospital, your health care providers will help you care for your skin. They may: Inspect your skin at least twice a day. This includes the areas under or around medical devices. Assess your nutrition and consult a dietitian if needed. Perform regular wound care. Help you move into a different position every few hours and adjust any medical devices. Keep your skin clean and dry. Use gentle cleansers and skin protectants if you are incontinent. Moisturize your dry skin. Skin care at home  Keep your skin clean and dry. Gently pat your skin dry. Do not rub or massage skin that is on bony areas. Moisturize dry skin. Use foams, creams, or powders to protect your skin from sweat, urine, and stool and reduce rubbing (friction) on the skin. Check your skin every day for any changes in color or any new blisters or sores. Check  under and around any medical devices and between skinfolds. Have a caregiver do this for you if you are not able. Nutrition  Drink enough fluid to keep your urine pale yellow. Eat a healthy diet that includes protein, vitamins, and minerals. Ask your health care provider what types of food you should eat. Lifestyle Do not use drugs or drink alcohol. Do not use any products that contain nicotine or tobacco. These products include cigarettes, chewing tobacco, and vaping devices, such as e-cigarettes. If  you need help quitting, ask your health care provider. Try to be active every day. Ask your health care provider what exercises or activities are safe for you. General instructions Take over-the-counter and prescription medicines only as told by your health care provider. Keep all follow-up visits. Work with your health care provider to manage any long-term (chronic) conditions. Where to find more information The National Pressure Injury Advisory Panel (NPIAP): npiap.com Contact a health care provider if: You feel or see any changes to your skin. This information is not intended to replace advice given to you by your health care provider. Make sure you discuss any questions you have with your health care provider. Document Revised: 07/22/2021 Document Reviewed: 07/22/2021 Elsevier Patient Education  2024 [employer].

## 2023-11-09 ENCOUNTER — Ambulatory Visit: Payer: Self-pay | Admitting: Nurse Practitioner

## 2023-11-09 LAB — CBC WITH DIFFERENTIAL/PLATELET
Basophils Absolute: 0.1 x10E3/uL (ref 0.0–0.2)
Basos: 1 %
EOS (ABSOLUTE): 0.3 x10E3/uL (ref 0.0–0.4)
Eos: 3 %
Hematocrit: 42.6 % (ref 34.0–46.6)
Hemoglobin: 14.4 g/dL (ref 11.1–15.9)
Immature Grans (Abs): 0 x10E3/uL (ref 0.0–0.1)
Immature Granulocytes: 0 %
Lymphocytes Absolute: 3.5 x10E3/uL — ABNORMAL HIGH (ref 0.7–3.1)
Lymphs: 34 %
MCH: 32.1 pg (ref 26.6–33.0)
MCHC: 33.8 g/dL (ref 31.5–35.7)
MCV: 95 fL (ref 79–97)
Monocytes Absolute: 0.8 x10E3/uL (ref 0.1–0.9)
Monocytes: 8 %
Neutrophils Absolute: 5.7 x10E3/uL (ref 1.4–7.0)
Neutrophils: 54 %
Platelets: 163 x10E3/uL (ref 150–450)
RBC: 4.48 x10E6/uL (ref 3.77–5.28)
RDW: 12.1 % (ref 11.7–15.4)
WBC: 10.4 x10E3/uL (ref 3.4–10.8)

## 2023-11-09 LAB — THYROID PANEL WITH TSH
Free Thyroxine Index: 2 (ref 1.2–4.9)
T3 Uptake Ratio: 21 — AB (ref 24–39)
T4, Total: 9.6 ug/dL (ref 4.5–12.0)
TSH: 4.59 u[IU]/mL — AB (ref 0.450–4.500)

## 2023-11-09 LAB — LIPID PANEL
Cholesterol, Total: 154 mg/dL (ref 100–199)
HDL: 49 mg/dL (ref >39)
LDL CALC COMMENT:: 3.1 ratio (ref 0.0–4.4)
LDL Chol Calc (NIH): 90 mg/dL (ref 0–99)
Triglycerides: 79 mg/dL (ref 0–149)
VLDL Cholesterol Cal: 15 mg/dL (ref 5–40)

## 2023-11-09 LAB — CMP14+EGFR
ALT: 9 IU/L (ref 0–32)
AST: 17 IU/L (ref 0–40)
Albumin: 4.3 g/dL (ref 3.8–4.8)
Alkaline Phosphatase: 71 IU/L (ref 44–121)
BUN/Creatinine Ratio: 18 (ref 12–28)
BUN: 15 mg/dL (ref 8–27)
Bilirubin Total: 0.5 mg/dL (ref 0.0–1.2)
CO2: 25 mmol/L (ref 20–29)
Calcium: 9.5 mg/dL (ref 8.7–10.3)
Chloride: 101 mmol/L (ref 96–106)
Creatinine, Ser: 0.83 mg/dL (ref 0.57–1.00)
Globulin, Total: 2.5 g/dL (ref 1.5–4.5)
Glucose: 86 mg/dL (ref 70–99)
Potassium: 4 mmol/L (ref 3.5–5.2)
Sodium: 141 mmol/L (ref 134–144)
Total Protein: 6.8 g/dL (ref 6.0–8.5)
eGFR: 73 mL/min/1.73 (ref >59)

## 2023-11-20 ENCOUNTER — Ambulatory Visit: Admitting: *Deleted

## 2023-11-27 ENCOUNTER — Ambulatory Visit: Attending: Neurology

## 2023-11-27 DIAGNOSIS — Z9181 History of falling: Secondary | ICD-10-CM | POA: Diagnosis not present

## 2023-11-27 DIAGNOSIS — R2681 Unsteadiness on feet: Secondary | ICD-10-CM | POA: Diagnosis not present

## 2023-11-27 NOTE — Therapy (Signed)
 OUTPATIENT PHYSICAL THERAPY NEURO TREATMENT   Patient Name: Angela Dawson MRN: [id number] DOB:06-19-1945, 78 y.o., female Today's Date: 11/27/2023   PCP: Gladis Mustard, FNP REFERRING PROVIDER: Rosemarie Eather RAMAN, MD   END OF SESSION:  PT End of Session - 11/27/23 1409     Visit Number 8    Number of Visits 12    Date for Recertification  11/30/23    PT Start Time 1407    PT Stop Time 1454    PT Time Calculation (min) 47 min    Activity Tolerance Patient tolerated treatment well    Behavior During Therapy [facility] for tasks assessed/performed            Past Medical History:  Diagnosis Date   Allergy    seasonal   Chicken pox    Complication of anesthesia    difficult to wake up after tubal ligations   Dyspnea    with exertion   Heart murmur    History of bronchitis    History of kidney stones    only one   Hypertension    Measles    Past Surgical History:  Procedure Laterality Date   AORTIC VALVE REPLACEMENT N/A 01/17/2016   Procedure: AORTIC VALVE REPLACEMENT (AVR) USING EDWARDS MAGNA EASE PERICARDIAL BIOPROSTHESIS VALVE;  Surgeon: Dallas KATHEE Jude, MD;  Location: [facility] OR;  Service: Open Heart Surgery;  Laterality: N/A;   CARDIAC CATHETERIZATION N/A 09/24/2015   Procedure: Right/Left Heart Cath and Coronary Angiography;  Surgeon: Candyce RAMAN Reek, MD;  [facility] INVASIVE CV LAB;  Service: Cardiovascular;  Laterality: N/A;   COLONOSCOPY     REPLACEMENT ASCENDING AORTA N/A 01/17/2016   Procedure: REPLACEMENT ASCENDING AORTA USING HEMASHIELD PLATINUM WOVEN DOUBLE VELOUR VASCULAR GRAFT;  Surgeon: Dallas KATHEE Jude, MD;  Location: [facility] OR;  Service: Open Heart Surgery;  Laterality: N/A;   TEE WITHOUT CARDIOVERSION N/A 01/17/2016   Procedure: TRANSESOPHAGEAL ECHOCARDIOGRAM (TEE);  Surgeon: Dallas KATHEE Jude, MD;  Location: [facility] OR;  Service: Open Heart Surgery;  Laterality: N/A;   TUBAL LIGATION     Patient Active Problem List   Diagnosis Date Noted    Gout of right foot 07/02/2023   Unspecified protein-calorie malnutrition 07/01/2023   Neurocognitive deficits 07/01/2023   History of CVA (cerebrovascular accident) 06/28/2023   Aortic atherosclerosis 06/28/2023   Chronic nonseasonal allergic rhinitis due to pollen 06/28/2023   Acquired hypothyroidism 06/28/2023   New onset atrial fibrillation (HCC) 06/23/2023   Prolonged QT interval 06/23/2023   History of recent fall 06/23/2023   Falls 06/23/2023   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) 06/23/2023   B12 deficiency 06/23/2023   Statin intolerance 10/03/2019   S/P AVR (aortic valve replacement) 01/17/2016   Ascending aorta dilatation 11/20/2013   Hyperlipidemia 09/30/2013   Severe obesity (BMI >= 40) (HCC) 09/30/2013   Essential hypertension 07/17/2012    ONSET DATE: May 2025  REFERRING DIAG: Cerebrovascular accident (CVA) due to embolism of precerebral artery, Gait disturbance, post-stroke   THERAPY DIAG:  Unsteadiness on feet  History of falling  Rationale for Evaluation and Treatment: Rehabilitation  SUBJECTIVE:  SUBJECTIVE STATEMENT: Pt denies any pain today, but reports feeling tired and that her sinuses are acting up today.     Pt accompanied by: self  PERTINENT HISTORY: HTN, atrial fibrillation, and history of a CVA, and allergies  PAIN:  Are you having pain? No  PRECAUTIONS: Fall  RED FLAGS: None   WEIGHT BEARING RESTRICTIONS: No  FALLS: Has patient fallen in last 6 months? Yes. Number of falls 2  LIVING ENVIRONMENT: Lives with: lives alone and has a caregiver with her most of the time Lives in: House/apartment Stairs: Yes: External: 1 steps; none Has following equipment at home: Walker - 2 wheeled  PLOF: Independent  PATIENT GOALS: improved  balance and safety  OBJECTIVE:  Note: Objective measures were completed at Evaluation unless otherwise noted.  DIAGNOSTIC FINDINGS: 06/23/23 brain MRI MPRESSION: 1. Positive for a Recent Embolic Event, with scattered small subacute infarcts in the anterior and posterior circulation. Notable involvement of the left lateral medulla, the central pons. No associated hemorrhage or mass effect. 2. Underlying fairly severe chronic small vessel disease, including a small number of chronic microhemorrhages. COGNITION: Overall cognitive status: Within functional limits for tasks assessed   SENSATION: Patient reports numbness around her lips on the left side of her face.   POSTURE: flexed trunk   LOWER EXTREMITY ROM: WFL for activities assessed  UPPER EXTREMITY MMT:  MMT Right eval Left eval  Shoulder flexion 4/5 4/5  Shoulder extension    Shoulder abduction 3+/5 3+/5  Shoulder adduction    Shoulder extension    Shoulder internal rotation    Shoulder external rotation    Middle trapezius    Lower trapezius    Elbow flexion 4/5 4/5  Elbow extension 3+/5 3+/5  Wrist flexion    Wrist extension    Wrist ulnar deviation    Wrist radial deviation    Wrist pronation    Wrist supination    Grip strength 20 20   (Blank rows = not tested)   LOWER EXTREMITY MMT:    MMT Right Eval Left Eval  Hip flexion 3+/5 3+/5  Hip extension    Hip abduction    Hip adduction    Hip internal rotation    Hip external rotation    Knee flexion 5/5 4/5; slight calf pain  Knee extension 4/5 4+/5  Ankle dorsiflexion 4-/5 4-/5  Ankle plantarflexion    Ankle inversion    Ankle eversion    (Blank rows = not tested)  BED MOBILITY:  Not tested  TRANSFERS: Sit to stand: Modified independence  Assistive device utilized: Environmental consultant - 2 wheeled     Stand to sit: Modified independence  Assistive device utilized: Environmental consultant - 2 wheeled      GAIT: Findings: Gait Characteristics: step through pattern,  decreased stride length, trunk flexed, poor foot clearance- Right, and poor foot clearance- Left, Assistive device utilized:Walker - 2 wheeled, and Level of assistance: Modified independence Gait speed: 0.43 m/s  FUNCTIONAL TESTS:  5 times sit to stand: 27.83 seconds  TREATMENT DATE:     11/27/23                                 EXERCISE LOG  Exercise Repetitions and Resistance Comments  Nustep  Lvl 3 x 15 mins   Heel/Toe Raises 2 mins   LAQs 2# x 3 mins alternating   Seated Marches 2# x 3 mins alternating   Seated Hip Abduction Red x 3 mins   Seated Hip Adduction 3 mins   Seated Ham Curls Red x 2 mins each   STS 2 sets of 5 reps   Gait 46.5 ft no AD    Blank cell = exercise not performed today   11/01/23    EXERCISE LOG  Exercise Repetitions and Resistance Comments  Nustep  L3 x 16 minutes    Toe taps on step   6 step x 15 reps each  BUE support from parallel bars for safety   Side-stepping Airex x 4 mins   Tandem Gait Airex x 4 mins   Standing Marches  Airex x 2.5 mins BUE support from parallel bars for safety   Standing gastroc stretch   BUE support from parallel bars for safety   Standing heel raise   BUE support from parallel bars for safety   Seated marching  2# x 2.5 mins   LAQ 2# x 25 reps each    Seated ham Curls Red x 20 reps bil13   Sit to stand  12 reps  With UE support from armrests   Seated hip ADD isometric  3 minutes w/ 5 second hold    Blank cell = exercise not performed today   10/22/2023                                   EXERCISE LOG  Exercise Repetitions and Resistance Comments  Nustep Lvl 3 x 15 mins    Seated Marching 2# x 2 min    LAQ 2# x 3 mins    OH press w/med ball 2# 1x12    Seated chops 2x10 2# med ball  Core activation  Hip add/abd 3 min ea    Sit-stand 19.24 seconds x 5 reps Upright Posture    Blank cell = exercise not performed today     PATIENT EDUCATION: Education details:  Person educated: Patient Education method: Explanation Education comprehension: verbalized understanding  HOME EXERCISE PROGRAM:   GOALS: Goals reviewed with patient? Yes  SHORT TERM GOALS: Target date: 10/24/23  Patient will be independent with her initial HEP.  Baseline: Goal status: MET  2.  Patient will improve her gait speed to at least 0.6 m/s for improved functional mobility.  Baseline: 8/18: .53 m/s Goal status: IN PROGRESS  3.  Patient will improve her five time sit to stand time to 20 seconds or less for improved lower extremity power.  Baseline: 8/18: 19.24 seconds Goal status: MET  LONG TERM GOALS: Target date: 11/14/23  Patient will be independent with her advanced HEP.  Baseline:  Goal status: IN PROGRESS  2.  Patient will improve her gait speed to at least 0.8 m/s for improved household mobility.  Baseline:  Goal status: IN PROGRESS  3.  Patient will improve her five time sit to stand time to 15 seconds or less to reduce her fall risk.  Baseline:  Goal status:  IN PROGRESS  4.  Patient will be able to transfer from sitting to standing without upper extremity support for improved independence.  Baseline:  Goal status: MET  ASSESSMENT:  CLINICAL IMPRESSION: Pt arrives for today's treatment session denying any pain, but reports increased fatigue today and sinus issues.  Pt able to perform STS transfer without use of UE support, meeting her LTG.  Pt given seated rest breaks as needed due to fatigue and decreased activity tolerance.  Pt able to ambulate 46.5 ft without AD.  Pt would like to practice ambulation more without AD.  Pt reported increased right hip pain at completion of today's treatment session.   OBJECTIVE IMPAIRMENTS: Abnormal gait, decreased activity tolerance, decreased balance, decreased mobility, difficulty walking, decreased strength, and postural dysfunction.   ACTIVITY LIMITATIONS: standing,  stairs, transfers, and locomotion level  PARTICIPATION LIMITATIONS: shopping and community activity  PERSONAL FACTORS: Age, Past/current experiences, Time since onset of injury/illness/exacerbation, Transportation, and 3+ comorbidities: HTN, atrial fibrillation, and history of a CVA, and allergies are also affecting patient's functional outcome.   REHAB POTENTIAL: Good  CLINICAL DECISION MAKING: Evolving/moderate complexity  EVALUATION COMPLEXITY: Moderate  PLAN:  PT FREQUENCY: 2x/week  PT DURATION: 6 weeks  PLANNED INTERVENTIONS: 97164- PT Re-evaluation, 97750- Physical Performance Testing, 97110-Therapeutic exercises, 97530- Therapeutic activity, 97112- Neuromuscular re-education, 97535- Self Care, [id number]- Manual therapy, [phone]- Gait training, Patient/Family education, Balance training, Stair training, Cryotherapy, and Moist heat  PLAN FOR NEXT SESSION: Nustep, lower extremity strengthening, gait training, and balance interventions   Delon DELENA Gosling, PTA 11/27/2023, 3:08 PM

## 2023-12-04 ENCOUNTER — Ambulatory Visit

## 2023-12-04 DIAGNOSIS — R2681 Unsteadiness on feet: Secondary | ICD-10-CM | POA: Diagnosis not present

## 2023-12-04 DIAGNOSIS — Z9181 History of falling: Secondary | ICD-10-CM | POA: Diagnosis not present

## 2023-12-04 NOTE — Therapy (Signed)
 OUTPATIENT PHYSICAL THERAPY NEURO TREATMENT   Patient Name: Angela Dawson MRN: [id number] DOB:10-17-1945, 78 y.o., female Today's Date: 12/04/2023   PCP: Gladis Mustard, FNP REFERRING PROVIDER: Rosemarie Eather RAMAN, MD   END OF SESSION:  PT End of Session - 12/04/23 1353     Visit Number 9    Number of Visits 12    Date for Recertification  11/30/23    PT Start Time 1349    PT Stop Time 1430    PT Time Calculation (min) 41 min    Activity Tolerance Patient tolerated treatment well    Behavior During Therapy [facility] for tasks assessed/performed            Past Medical History:  Diagnosis Date   Allergy    seasonal   Chicken pox    Complication of anesthesia    difficult to wake up after tubal ligations   Dyspnea    with exertion   Heart murmur    History of bronchitis    History of kidney stones    only one   Hypertension    Measles    Past Surgical History:  Procedure Laterality Date   AORTIC VALVE REPLACEMENT N/A 01/17/2016   Procedure: AORTIC VALVE REPLACEMENT (AVR) USING EDWARDS MAGNA EASE PERICARDIAL BIOPROSTHESIS VALVE;  Surgeon: Dallas KATHEE Jude, MD;  Location: [facility] OR;  Service: Open Heart Surgery;  Laterality: N/A;   CARDIAC CATHETERIZATION N/A 09/24/2015   Procedure: Right/Left Heart Cath and Coronary Angiography;  Surgeon: Candyce RAMAN Reek, MD;  Location: [facility] INVASIVE CV LAB;  Service: Cardiovascular;  Laterality: N/A;   COLONOSCOPY     REPLACEMENT ASCENDING AORTA N/A 01/17/2016   Procedure: REPLACEMENT ASCENDING AORTA USING HEMASHIELD PLATINUM WOVEN DOUBLE VELOUR VASCULAR GRAFT;  Surgeon: Dallas KATHEE Jude, MD;  Location: [facility] OR;  Service: Open Heart Surgery;  Laterality: N/A;   TEE WITHOUT CARDIOVERSION N/A 01/17/2016   Procedure: TRANSESOPHAGEAL ECHOCARDIOGRAM (TEE);  Surgeon: Dallas KATHEE Jude, MD;  Location: [facility] OR;  Service: Open Heart Surgery;  Laterality: N/A;   TUBAL LIGATION     Patient Active Problem List   Diagnosis Date Noted    Gout of right foot 07/02/2023   Unspecified protein-calorie malnutrition 07/01/2023   Neurocognitive deficits 07/01/2023   History of CVA (cerebrovascular accident) 06/28/2023   Aortic atherosclerosis 06/28/2023   Chronic nonseasonal allergic rhinitis due to pollen 06/28/2023   Acquired hypothyroidism 06/28/2023   New onset atrial fibrillation (HCC) 06/23/2023   Prolonged QT interval 06/23/2023   History of recent fall 06/23/2023   Falls 06/23/2023   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) 06/23/2023   B12 deficiency 06/23/2023   Statin intolerance 10/03/2019   S/P AVR (aortic valve replacement) 01/17/2016   Ascending aorta dilatation 11/20/2013   Hyperlipidemia 09/30/2013   Severe obesity (BMI >= 40) (HCC) 09/30/2013   Essential hypertension 07/17/2012    ONSET DATE: May 2025  REFERRING DIAG: Cerebrovascular accident (CVA) due to embolism of precerebral artery, Gait disturbance, post-stroke   THERAPY DIAG:  Unsteadiness on feet  History of falling  Rationale for Evaluation and Treatment: Rehabilitation  SUBJECTIVE:  SUBJECTIVE STATEMENT: Pt denies any pain today, but reports feeling tired and that her sinuses are acting up today.     Pt accompanied by: self  PERTINENT HISTORY: HTN, atrial fibrillation, and history of a CVA, and allergies  PAIN:  Are you having pain? No  PRECAUTIONS: Fall  RED FLAGS: None   WEIGHT BEARING RESTRICTIONS: No  FALLS: Has patient fallen in last 6 months? Yes. Number of falls 2  LIVING ENVIRONMENT: Lives with: lives alone and has a caregiver with her most of the time Lives in: House/apartment Stairs: Yes: External: 1 steps; none Has following equipment at home: Walker - 2 wheeled  PLOF: Independent  PATIENT GOALS: improved  balance and safety  OBJECTIVE:  Note: Objective measures were completed at Evaluation unless otherwise noted.  DIAGNOSTIC FINDINGS: 06/23/23 brain MRI MPRESSION: 1. Positive for a Recent Embolic Event, with scattered small subacute infarcts in the anterior and posterior circulation. Notable involvement of the left lateral medulla, the central pons. No associated hemorrhage or mass effect. 2. Underlying fairly severe chronic small vessel disease, including a small number of chronic microhemorrhages. COGNITION: Overall cognitive status: Within functional limits for tasks assessed   SENSATION: Patient reports numbness around her lips on the left side of her face.   POSTURE: flexed trunk   LOWER EXTREMITY ROM: WFL for activities assessed  UPPER EXTREMITY MMT:  MMT Right eval Left eval  Shoulder flexion 4/5 4/5  Shoulder extension    Shoulder abduction 3+/5 3+/5  Shoulder adduction    Shoulder extension    Shoulder internal rotation    Shoulder external rotation    Middle trapezius    Lower trapezius    Elbow flexion 4/5 4/5  Elbow extension 3+/5 3+/5  Wrist flexion    Wrist extension    Wrist ulnar deviation    Wrist radial deviation    Wrist pronation    Wrist supination    Grip strength 20 20   (Blank rows = not tested)   LOWER EXTREMITY MMT:    MMT Right Eval Left Eval  Hip flexion 3+/5 3+/5  Hip extension    Hip abduction    Hip adduction    Hip internal rotation    Hip external rotation    Knee flexion 5/5 4/5; slight calf pain  Knee extension 4/5 4+/5  Ankle dorsiflexion 4-/5 4-/5  Ankle plantarflexion    Ankle inversion    Ankle eversion    (Blank rows = not tested)  BED MOBILITY:  Not tested  TRANSFERS: Sit to stand: Modified independence  Assistive device utilized: Environmental consultant - 2 wheeled     Stand to sit: Modified independence  Assistive device utilized: Environmental consultant - 2 wheeled      GAIT: Findings: Gait Characteristics: step through pattern,  decreased stride length, trunk flexed, poor foot clearance- Right, and poor foot clearance- Left, Assistive device utilized:Walker - 2 wheeled, and Level of assistance: Modified independence Gait speed: 0.43 m/s  FUNCTIONAL TESTS:  5 times sit to stand: 27.83 seconds  TREATMENT DATE:    12/04/23                                 EXERCISE LOG  Exercise Repetitions and Resistance Comments  Nustep  Lvl 3 x 15 mins   Heel/Toe Raises 2 mins   LAQs 3# x 3 mins alternating   Seated Marches 3# x 2.5 mins alternating   Seated Hip Abduction Red x 3 mins   Seated Hip Adduction 3 mins   Seated Ham Curls Red x 2 mins each   STS    Gait 51 ft no AD    Blank cell = exercise not performed today   11/01/23    EXERCISE LOG  Exercise Repetitions and Resistance Comments  Nustep  L3 x 16 minutes    Toe taps on step   6 step x 15 reps each  BUE support from parallel bars for safety   Side-stepping Airex x 4 mins   Tandem Gait Airex x 4 mins   Standing Marches  Airex x 2.5 mins BUE support from parallel bars for safety   Standing gastroc stretch   BUE support from parallel bars for safety   Standing heel raise   BUE support from parallel bars for safety   Seated marching  2# x 2.5 mins   LAQ 2# x 25 reps each    Seated ham Curls Red x 20 reps bil13   Sit to stand  12 reps  With UE support from armrests   Seated hip ADD isometric  3 minutes w/ 5 second hold    Blank cell = exercise not performed today   10/22/2023                                   EXERCISE LOG  Exercise Repetitions and Resistance Comments  Nustep Lvl 3 x 15 mins    Seated Marching 2# x 2 min    LAQ 2# x 3 mins    OH press w/med ball 2# 1x12    Seated chops 2x10 2# med ball  Core activation  Hip add/abd 3 min ea    Sit-stand 19.24 seconds x 5 reps Upright Posture    Blank cell = exercise not performed today    PATIENT  EDUCATION: Education details:  Person educated: Patient Education method: Explanation Education comprehension: verbalized understanding  HOME EXERCISE PROGRAM:   GOALS: Goals reviewed with patient? Yes  SHORT TERM GOALS: Target date: 10/24/23  Patient will be independent with her initial HEP.  Baseline: Goal status: MET  2.  Patient will improve her gait speed to at least 0.6 m/s for improved functional mobility.  Baseline: 8/18: .53 m/s Goal status: IN PROGRESS  3.  Patient will improve her five time sit to stand time to 20 seconds or less for improved lower extremity power.  Baseline: 8/18: 19.24 seconds Goal status: MET  LONG TERM GOALS: Target date: 11/14/23  Patient will be independent with her advanced HEP.  Baseline:  Goal status: IN PROGRESS  2.  Patient will improve her gait speed to at least 0.8 m/s for improved household mobility.  Baseline:  Goal status: IN PROGRESS  3.  Patient will improve her five time sit to stand time to 15 seconds or less to reduce her fall risk.  Baseline:  Goal status: IN PROGRESS  4.  Patient will be able to transfer from sitting to standing without upper extremity support for improved independence.  Baseline:  Goal status: MET  ASSESSMENT:  CLINICAL IMPRESSION: Pt arrives for today's treatment session denying any pain, but reports increased fatigue today with continued sinus problems.  Pt able to tolerate increased ambulation today with pt able to ambulate 51 ft without AD.  Pt able to tolerate increased time or reps with various seated exercises.  Pt would benefit from progression to standing exercises to tolerance.  Pt denied any pain at completion of today's treatment session.  OBJECTIVE IMPAIRMENTS: Abnormal gait, decreased activity tolerance, decreased balance, decreased mobility, difficulty walking, decreased strength, and postural dysfunction.   ACTIVITY LIMITATIONS: standing, stairs, transfers, and locomotion  level  PARTICIPATION LIMITATIONS: shopping and community activity  PERSONAL FACTORS: Age, Past/current experiences, Time since onset of injury/illness/exacerbation, Transportation, and 3+ comorbidities: HTN, atrial fibrillation, and history of a CVA, and allergies are also affecting patient's functional outcome.   REHAB POTENTIAL: Good  CLINICAL DECISION MAKING: Evolving/moderate complexity  EVALUATION COMPLEXITY: Moderate  PLAN:  PT FREQUENCY: 2x/week  PT DURATION: 6 weeks  PLANNED INTERVENTIONS: 97164- PT Re-evaluation, 97750- Physical Performance Testing, 97110-Therapeutic exercises, 97530- Therapeutic activity, 97112- Neuromuscular re-education, 97535- Self Care, [id number]- Manual therapy, [phone]- Gait training, Patient/Family education, Balance training, Stair training, Cryotherapy, and Moist heat  PLAN FOR NEXT SESSION: Nustep, lower extremity strengthening, gait training, and balance interventions   Delon DELENA Gosling, PTA 12/04/2023, 3:07 PM

## 2023-12-06 ENCOUNTER — Ambulatory Visit: Attending: Cardiology | Admitting: Cardiology

## 2023-12-06 ENCOUNTER — Encounter: Payer: Self-pay | Admitting: Cardiology

## 2023-12-06 VITALS — BP 136/76 | HR 79 | Ht <= 58 in | Wt 226.8 lb

## 2023-12-06 DIAGNOSIS — D6869 Other thrombophilia: Secondary | ICD-10-CM

## 2023-12-06 DIAGNOSIS — R0609 Other forms of dyspnea: Secondary | ICD-10-CM | POA: Diagnosis not present

## 2023-12-06 DIAGNOSIS — E782 Mixed hyperlipidemia: Secondary | ICD-10-CM

## 2023-12-06 DIAGNOSIS — Z952 Presence of prosthetic heart valve: Secondary | ICD-10-CM

## 2023-12-06 DIAGNOSIS — I4891 Unspecified atrial fibrillation: Secondary | ICD-10-CM | POA: Diagnosis not present

## 2023-12-06 MED ORDER — EZETIMIBE 10 MG PO TABS: 10.0000 mg | ORAL_TABLET | Freq: Every day | ORAL | 3 refills | Status: AC

## 2023-12-06 NOTE — Progress Notes (Signed)
 Clinical Summary Ms. Brickner is a 78 y.o.female seen today for follow up of the following medical problems.    1. Aortic stenosis with bicuspid AV and ascending aortic aneurysm.   - 09/2013 echo showed LVEF 65-70%, grade I diastolic dysfunction. Bicuspid AV with moderate to severe AS (mean grad 21, dimensionless index 0.32, valve area 0.93), mild AI. Described mild aorta dilatation from limited views, ascending aorta 4.6 cm. - CTA chest 05/2014 with asscending thoracic aortic aneurysm 4.9 cm.   01/2015 CTA chest ascending aortic aneurysm 4.6 x 4.6 cm.  Forllowed by Dr Army.  -06/2014 echo LVEF 65-70%, AVA area 0.91, mean grad 32 - 09/2015 echo LVEF 60-65%, mod to severe AS mean grad 34, AVA 1 -09/2015 cath which showed no significant CAD - 05/2016 echo:     -she is s/p AVR with Edwards Lifesciene pericardial tissue valve 21mm and supracoronary replacement of the ascending aorta on 01/18/16      06/2023 echo: LVEF 65-70%, no WMAs, grade II dd, Valve with moderate AS mean grad 26, DI 0.33 - some ongoing SOB/DOE. Some LE edema.    2.HTN - compliant with meds   3. Hyperlipidemia - previously troubles with tolerating statins. Since her CVA started on crestor  5mg  daily and tolerating.  - started crestor  5mg  daily, denies side effects - 11/2023 TC 154 TG 79 HDL 49 LDL 90    4. 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 AFib - diagnosed during 06/2023 admission with CVA - no recent palpitations - no bleeding on eliquis .  Past Medical History:  Diagnosis Date   Allergy    seasonal   Chicken pox    Complication of anesthesia    difficult to wake up after tubal ligations   Dyspnea    with exertion   Heart murmur    History of bronchitis    History of kidney stones    only one   Hypertension    Measles      Allergies  Allergen Reactions   Lipitor [Atorvastatin ] Other (See Comments)    Unable to stand or move   Allopurinol  Diarrhea and  Nausea And Vomiting    Pt reported this and colchicine  both caused a lot of stomach issues   Colchicine  Diarrhea and Nausea And Vomiting    Pt reported this and allopurinol  both caused a lot of stomach issues   Meloxicam  Other (See Comments)    Made patient dizzy and caused her heart to race but can take a half tablet   Statins Other (See Comments)    Leg cramps    Crestor  [Rosuvastatin ] Other (See Comments)    Dizziness   Penicillins Rash    Immediate rash, facial/tongue/throat swelling, SOB or lightheadedness with hypotension     Current Outpatient Medications  Medication Sig Dispense Refill   acetaminophen  (TYLENOL ) 325 MG tablet Take 2 tablets (650 mg total) by mouth every 6 (six) hours as needed for mild pain.     apixaban  (ELIQUIS ) 5 MG TABS tablet Take 1 tablet (5 mg total) by mouth 2 (two) times daily. 180 tablet 1   Ascorbic Acid (VITAMIN C PO) Take 1 tablet by mouth daily. 500 mg     Calcium  Carb-Cholecalciferol  600-800 MG-UNIT TABS Take 1 tablet by mouth daily.     Cholecalciferol  (VITAMIN D ) 2000 units CAPS Take 2,000 Units by mouth daily.     cyanocobalamin  1000 MCG tablet Take 1,000 mcg  by mouth daily.     feeding supplement (ENSURE ENLIVE / ENSURE PLUS) LIQD Take 237 mLs by mouth 2 (two) times daily between meals. 237 mL 12   lisinopril  (ZESTRIL ) 40 MG tablet Take 1 tablet (40 mg total) by mouth daily. 90 tablet 1   loratadine  (CLARITIN ) 10 MG tablet Take 10 mg by mouth daily.     meclizine  (ANTIVERT ) 12.5 MG tablet Take 1 tablet (12.5 mg total) by mouth every 6 (six) hours as needed for dizziness. 30 tablet 0   metoprolol  tartrate (LOPRESSOR ) 25 MG tablet Take 1 tablet (25 mg total) by mouth 2 (two) times daily. 60 tablet 5   rosuvastatin  (CRESTOR ) 5 MG tablet TAKE 1 TABLET BY MOUTH THREE TIMES A WEEK 36 tablet 0   No current facility-administered medications for this visit.     Past Surgical History:  Procedure Laterality Date   AORTIC VALVE REPLACEMENT N/A  01/17/2016   Procedure: AORTIC VALVE REPLACEMENT (AVR) USING EDWARDS MAGNA EASE PERICARDIAL BIOPROSTHESIS VALVE;  Surgeon: Dallas KATHEE Jude, MD;  Location: [facility] OR;  Service: Open Heart Surgery;  Laterality: N/A;   CARDIAC CATHETERIZATION N/A 09/24/2015   Procedure: Right/Left Heart Cath and Coronary Angiography;  Surgeon: Candyce GORMAN Reek, MD;  Location: [facility] INVASIVE CV LAB;  Service: Cardiovascular;  Laterality: N/A;   COLONOSCOPY     REPLACEMENT ASCENDING AORTA N/A 01/17/2016   Procedure: REPLACEMENT ASCENDING AORTA USING HEMASHIELD PLATINUM WOVEN DOUBLE VELOUR VASCULAR GRAFT;  Surgeon: Dallas KATHEE Jude, MD;  Location: [facility] OR;  Service: Open Heart Surgery;  Laterality: N/A;   TEE WITHOUT CARDIOVERSION N/A 01/17/2016   Procedure: TRANSESOPHAGEAL ECHOCARDIOGRAM (TEE);  Surgeon: Dallas KATHEE Jude, MD;  Location: [facility] OR;  Service: Open Heart Surgery;  Laterality: N/A;   TUBAL LIGATION       Allergies  Allergen Reactions   Lipitor [Atorvastatin ] Other (See Comments)    Unable to stand or move   Allopurinol  Diarrhea and Nausea And Vomiting    Pt reported this and colchicine  both caused a lot of stomach issues   Colchicine  Diarrhea and Nausea And Vomiting    Pt reported this and allopurinol  both caused a lot of stomach issues   Meloxicam  Other (See Comments)    Made patient dizzy and caused her heart to race but can take a half tablet   Statins Other (See Comments)    Leg cramps    Crestor  [Rosuvastatin ] Other (See Comments)    Dizziness   Penicillins Rash    Immediate rash, facial/tongue/throat swelling, SOB or lightheadedness with hypotension      Family History  Problem Relation Age of Onset   Alzheimer's disease Mother    Heart disease Mother    Heart attack Father 33   Post-traumatic stress disorder Brother    Breast cancer Maternal Aunt    Heart attack Son    Other Son        5 back surgeries   Stroke Neg Hx      Social History Ms. Boehlke reports that  she quit smoking about 50 years ago. Her smoking use included cigarettes. She started smoking about 55 years ago. She has a 2.6 pack-year smoking history. She has been exposed to tobacco smoke. She has never used smokeless tobacco. Ms. Ang reports that she does not currently use alcohol.    Physical Examination Today's Vitals   12/06/23 1442  BP: 136/76  Pulse: 79  SpO2: 98%  Weight: 226 lb 12.8 oz (102.9 kg)  Height:  4' 10 (1.473 m)   Body mass index is 47.4 kg/m.  Gen: resting comfortably, no acute distress HEENT: no scleral icterus, pupils equal round and reactive, no palptable cervical adenopathy,  CV: RRR, 3/6 systolic murmur rusb, no jvd Resp: Clear to auscultation bilaterally GI: abdomen is soft, non-tender, non-distended, normal bowel sounds, no hepatosplenomegaly MSK: extremities are warm, no edema.  Skin: warm, no rash Neuro:  no focal deficits Psych: appropriate affect   Diagnostic Studies  09/2013 Echo Study Conclusions  - Left ventricle: The cavity size was normal. Wall thickness was increased in a pattern of mild LVH. Systolic function was vigorous. The estimated ejection fraction was in the range of 65% to 70%. Wall motion was normal; there were no regional wall motion abnormalities. Doppler parameters are consistent with abnormal left ventricular relaxation (grade 1 diastolic dysfunction). - Aortic valve: Bicuspid; moderately calcified leaflets. Cusp separation was reduced. There was moderate to severe stenosis. There was mild regurgitation. Mean gradient (S): 21 mm Hg. LVOT/AV VTI ratio 0.32. Peak gradient (S): 40 mm Hg. Valve area (VTI): 0.93 cm^2. Valve area (Vmax): 0.96 cm^2. - Ascending aorta: The ascending aorta was moderately dilated. Approximately 46 mm. - Mitral valve: Calcified annulus. There was trivial regurgitation. - Right atrium: Central venous pressure (est): 3 mm Hg. - Atrial septum: No defect or patent foramen ovale was  identified. - Tricuspid valve: There was trivial regurgitation. - Pulmonary arteries: PA peak pressure: 29 mm Hg (S). - Pericardium, extracardiac: There was no pericardial effusion.  Impressions:  - Mild LVH with LVEF 65-70%, grade 1 diastolic dysfunction. Moderate to severe aortic stenosis as outlined with bicuspid aortic valve and mild aortic regurgitation. There is also at least moderate dilatation of the ascending aorta based on limited views. Given association of aortic aneurysmal disease with bicuspid aortic valve, would suggest further dedicated imaging of the thoracic aorta with CTA or MRA. Normal PASP 29 mmHg.     05/2014 CTA chest IMPRESSION: Maximal diameter of the ascending aorta is 4.9 cm compare with 4.8 cm on the prior study. No evidence of dissection.   There are airspace and ground-glass opacities in the left upper lobe most consistent with an inflammatory process. There is also a 2 mm nodule. If the patient is at high risk for bronchogenic carcinoma, follow-up chest CT at 1 year is recommended. If the patient is at low risk, no follow-up is needed. This recommendation follows the consensus statement: Guidelines for Management of Small Pulmonary Nodules Detected on CT Scans: A Statement from the Fleischner Society as published in Radiology 2005; 237:395-400.     ADDENDUM: The patient therefore has a thoracic aortic aneurysm that has only increased from 4.8 cm to 4.9 cm since August of last year. Ascending thoracic aortic aneurysm. Recommend semi-annual imaging followup by CTA or MRA and referral to cardiothoracic surgery if not already obtained. This recommendation follows 2010 ACCF/AHA/AATS/ACR/ASA/SCA/SCAI/SIR/STS/SVM Guidelines for the Diagnosis and Management of Patients With Thoracic Aortic Disease. Circulation. 2010; 121: z733-z630     06/2014 Echo Study Conclusions  - Left ventricle: The cavity size was normal. Systolic function was   vigorous. The  estimated ejection fraction was in the range of 65%   to 70%. Wall motion was normal; there were no regional wall   motion abnormalities. Features are consistent with a pseudonormal   left ventricular filling pattern, with concomitant abnormal   relaxation and increased filling pressure (grade 2 diastolic   dysfunction). Doppler parameters are consistent with high   ventricular  filling pressure. Mild to moderate concentric left   ventricular hypertrophy. - Aortic valve: Mildly to moderately calcified annulus. Moderately   thickened, severely calcified leaflets. Cusp separation was   reduced. There was moderate to severe stenosis. There was mild   regurgitation. Peak velocity (S): 364 cm/s. Mean gradient: 32   mmHg. Valve area (Vmax): 0.91 cm^2. - Aorta: Ascending aortic diameter: 43 mm (S). Mild ascending   aortic dilatation. - Mitral valve: Mildly to moderately calcified annulus. There was   mild regurgitation.  Impressions:  - When compared to the report dated 10/02/13, the mean aortic valve   gradient has increased by 11 mmHg. Moderate to severe stenosis is   seen.   01/2015 CTA chest IMPRESSION: Dilatation of the thoracic aorta in its ascending component measuring 4.6 x 4.6 cm in greatest dimension. This is likely stable from the prior exam as some motion artifact was seen on the prior study.   09/2015 echo Study Conclusions   - Left ventricle: The cavity size was normal. Wall thickness was   increased in a pattern of moderate LVH. Systolic function was   normal. The estimated ejection fraction was in the range of 60%   to 65%. Wall motion was normal; there were no regional wall   motion abnormalities. Features are consistent with a pseudonormal   left ventricular filling pattern, with concomitant abnormal   relaxation and increased filling pressure (grade 2 diastolic   dysfunction). Doppler parameters are consistent with high   ventricular filling pressure. - Aortic  valve: Moderately thickened, severely calcified leaflets.   There was moderate to severe stenosis. There was mild   regurgitation. Peak velocity (S): 390 cm/s. Mean gradient (S): 34   mm Hg. Valve area (VTI): 1.01 cm^2. Valve area (Vmax): 0.89 cm^2.   Valve area (Vmean): 0.93 cm^2. - Aorta: Ascending aortic diameter: 46 mm (S). - Ascending aorta: The ascending aorta was mild to moderately   dilated. - Mitral valve: Calcified annulus. There was mild regurgitation. - Left atrium: The atrium was moderately dilated. - Right ventricle: Systolic function was mildly to moderately   reduced. - Tricuspid valve: There was mild-moderate regurgitation.   09/2015 Cath The left ventricular systolic function is normal. Nonobstructive CAD. Normal PA pressures. CI 2.3; PA sat 68%. Dilated ascending aorta.   Continue with plans for AVR and aneurysm repair   01/2021 Venous US  IMPRESSION: No evidence of deep venous thrombosis in either lower extremity.     06/2023 echo 1. Left ventricular ejection fraction, by estimation, is 65 to 70%. The  left ventricle has normal function. The left ventricle has no regional  wall motion abnormalities. Left ventricular diastolic parameters are  consistent with Grade II diastolic  dysfunction (pseudonormalization).   2. Right ventricular systolic function is normal. The right ventricular  size is mildly enlarged. There is normal pulmonary artery systolic  pressure. The estimated right ventricular systolic pressure is 24.0 mmHg.   3. Left atrial size was mildly dilated.   4. The mitral valve is degenerative. Trivial mitral valve regurgitation.  No evidence of mitral stenosis.   5. 21 mm bioprosthetic aortic valve with moderate stenosis. Vmax 3.5 m/s,  MG 26 mmHG, EOA 1.04 cm2, DI 0.33. The aortic valve has been  repaired/replaced. Aortic valve regurgitation is trivial. There is a 21 mm  Edwards bioprosthetic valve present in the  aortic position. Procedure  Date: 01/27/2016. Echo findings are consistent  with stenosis of the aortic prosthesis. Aortic valve area, by VTI measures  1.04 cm. Aortic valve mean gradient measures 26.5 mmHg. Aortic valve Vmax  measures 3.53 m/s.   6. The inferior vena cava is normal in size with greater than 50%  respiratory variability, suggesting right atrial pressure of 3 mmHg.   Assessment and Plan   1. Bicuspid aortic valve with aortic stenosis and aortic aneurysm - s/p tissue AVR and ascending aorta replacement - uptrend in gradient of her prosthetic valve by recent echo, has had some DOE - repeat echo reassess gradient, may warrant cardiac CT if progressive   2. HTN -at goal,continue current meds   3. Hyperlipidemia - previously intolerant to statins but tolerating crestor  5mg  daily since her CVA - LDL remains above goal, add zetia  10mg  daily.   4. Afib/acquired thrombophilia - no symptoms, continue current meds including eliquis  for stroke prevention   Dorn PHEBE Ross, [name],

## 2023-12-06 NOTE — Patient Instructions (Signed)
 Medication Instructions:   Begin Zetia  10mg  daily  Continue all other medications.     Labwork:  none  Testing/Procedures:  Your physician has requested that you have an echocardiogram. Echocardiography is a painless test that uses sound waves to create images of your heart. It provides your doctor with information about the size and shape of your heart and how well your heart's chambers and valves are working. This procedure takes approximately one hour. There are no restrictions for this procedure. Please do NOT wear cologne, perfume, aftershave, or lotions (deodorant is allowed). Please arrive 15 minutes prior to your appointment time.  Please note: We ask at that you not bring children with you during ultrasound (echo/ vascular) testing. Due to room size and safety concerns, children are not allowed in the ultrasound rooms during exams. Our front office staff cannot provide observation of children in our lobby area while testing is being conducted. An adult accompanying a patient to their appointment will only be allowed in the ultrasound room at the discretion of the ultrasound technician under special circumstances. We apologize for any inconvenience. Office will contact with results via phone, letter or mychart.    Follow-Up:  6 months   Any Other Special Instructions Will Be Listed Below (If Applicable).   If you need a refill on your cardiac medications before your next appointment, please call your pharmacy.

## 2023-12-11 ENCOUNTER — Ambulatory Visit: Attending: Neurology | Admitting: *Deleted

## 2023-12-11 ENCOUNTER — Encounter: Payer: Self-pay | Admitting: *Deleted

## 2023-12-11 DIAGNOSIS — R2681 Unsteadiness on feet: Secondary | ICD-10-CM | POA: Insufficient documentation

## 2023-12-11 DIAGNOSIS — Z9181 History of falling: Secondary | ICD-10-CM | POA: Insufficient documentation

## 2023-12-11 NOTE — Therapy (Signed)
 OUTPATIENT PHYSICAL THERAPY NEURO TREATMENT   Patient Name: Angela Dawson MRN: [id number] DOB:Jun 11, 1945, 78 y.o., female Today's Date: 12/11/2023   PCP: Gladis Mustard, FNP REFERRING PROVIDER: Rosemarie Eather RAMAN, MD   END OF SESSION:  PT End of Session - 12/11/23 1401     Visit Number 10    Number of Visits 13    Date for Recertification  11/30/23    Authorization - Number of Visits 13     PT Start Time 1345    PT Stop Time 1435    PT Time Calculation (min) 50 min            Past Medical History:  Diagnosis Date   Allergy    seasonal   Chicken pox    Complication of anesthesia    difficult to wake up after tubal ligations   Dyspnea    with exertion   Heart murmur    History of bronchitis    History of kidney stones    only one   Hypertension    Measles    Past Surgical History:  Procedure Laterality Date   AORTIC VALVE REPLACEMENT N/A 01/17/2016   Procedure: AORTIC VALVE REPLACEMENT (AVR) USING EDWARDS MAGNA EASE PERICARDIAL BIOPROSTHESIS VALVE;  Surgeon: Dallas KATHEE Jude, MD;  Location: [facility] OR;  Service: Open Heart Surgery;  Laterality: N/A;   CARDIAC CATHETERIZATION N/A 09/24/2015   Procedure: Right/Left Heart Cath and Coronary Angiography;  Surgeon: Candyce RAMAN Reek, MD;  Location: [facility] INVASIVE CV LAB;  Service: Cardiovascular;  Laterality: N/A;   COLONOSCOPY     REPLACEMENT ASCENDING AORTA N/A 01/17/2016   Procedure: REPLACEMENT ASCENDING AORTA USING HEMASHIELD PLATINUM WOVEN DOUBLE VELOUR VASCULAR GRAFT;  Surgeon: Dallas KATHEE Jude, MD;  Location: [facility] OR;  Service: Open Heart Surgery;  Laterality: N/A;   TEE WITHOUT CARDIOVERSION N/A 01/17/2016   Procedure: TRANSESOPHAGEAL ECHOCARDIOGRAM (TEE);  Surgeon: Dallas KATHEE Jude, MD;  Location: [facility] OR;  Service: Open Heart Surgery;  Laterality: N/A;   TUBAL LIGATION     Patient Active Problem List   Diagnosis Date Noted   Gout of right foot 07/02/2023   Unspecified protein-calorie malnutrition  07/01/2023   Neurocognitive deficits 07/01/2023   History of CVA (cerebrovascular accident) 06/28/2023   Aortic atherosclerosis 06/28/2023   Chronic nonseasonal allergic rhinitis due to pollen 06/28/2023   Acquired hypothyroidism 06/28/2023   New onset atrial fibrillation (HCC) 06/23/2023   Prolonged QT interval 06/23/2023   History of recent fall 06/23/2023   Falls 06/23/2023   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) 06/23/2023   B12 deficiency 06/23/2023   Statin intolerance 10/03/2019   S/P AVR (aortic valve replacement) 01/17/2016   Ascending aorta dilatation 11/20/2013   Hyperlipidemia 09/30/2013   Severe obesity (BMI >= 40) (HCC) 09/30/2013   Essential hypertension 07/17/2012    ONSET DATE: May 2025  REFERRING DIAG: Cerebrovascular accident (CVA) due to embolism of precerebral artery, Gait disturbance, post-stroke   THERAPY DIAG:  Unsteadiness on feet  History of falling  Rationale for Evaluation and Treatment: Rehabilitation  SUBJECTIVE:  SUBJECTIVE STATEMENT: Pt denies any pain today and did good after last Rx     Pt accompanied by: self  PERTINENT HISTORY: HTN, atrial fibrillation, and history of a CVA, and allergies  PAIN:  Are you having pain? No  PRECAUTIONS: Fall  RED FLAGS: None   WEIGHT BEARING RESTRICTIONS: No  FALLS: Has patient fallen in last 6 months? Yes. Number of falls 2  LIVING ENVIRONMENT: Lives with: lives alone and has a caregiver with her most of the time Lives in: House/apartment Stairs: Yes: External: 1 steps; none Has following equipment at home: Walker - 2 wheeled  PLOF: Independent  PATIENT GOALS: improved balance and safety  OBJECTIVE:  Note: Objective measures were completed at Evaluation unless otherwise  noted.  DIAGNOSTIC FINDINGS: 06/23/23 brain MRI MPRESSION: 1. Positive for a Recent Embolic Event, with scattered small subacute infarcts in the anterior and posterior circulation. Notable involvement of the left lateral medulla, the central pons. No associated hemorrhage or mass effect. 2. Underlying fairly severe chronic small vessel disease, including a small number of chronic microhemorrhages. COGNITION: Overall cognitive status: Within functional limits for tasks assessed   SENSATION: Patient reports numbness around her lips on the left side of her face.   POSTURE: flexed trunk   LOWER EXTREMITY ROM: WFL for activities assessed  UPPER EXTREMITY MMT:  MMT Right eval Left eval  Shoulder flexion 4/5 4/5  Shoulder extension    Shoulder abduction 3+/5 3+/5  Shoulder adduction    Shoulder extension    Shoulder internal rotation    Shoulder external rotation    Middle trapezius    Lower trapezius    Elbow flexion 4/5 4/5  Elbow extension 3+/5 3+/5  Wrist flexion    Wrist extension    Wrist ulnar deviation    Wrist radial deviation    Wrist pronation    Wrist supination    Grip strength 20 20   (Blank rows = not tested)   LOWER EXTREMITY MMT:    MMT Right Eval Left Eval  Hip flexion 3+/5 3+/5  Hip extension    Hip abduction    Hip adduction    Hip internal rotation    Hip external rotation    Knee flexion 5/5 4/5; slight calf pain  Knee extension 4/5 4+/5  Ankle dorsiflexion 4-/5 4-/5  Ankle plantarflexion    Ankle inversion    Ankle eversion    (Blank rows = not tested)  BED MOBILITY:  Not tested  TRANSFERS: Sit to stand: Modified independence  Assistive device utilized: Environmental consultant - 2 wheeled     Stand to sit: Modified independence  Assistive device utilized: Environmental consultant - 2 wheeled      GAIT: Findings: Gait Characteristics: step through pattern, decreased stride length, trunk flexed, poor foot clearance- Right, and poor foot clearance- Left, Assistive device  utilized:Walker - 2 wheeled, and Level of assistance: Modified independence Gait speed: 0.43 m/s  FUNCTIONAL TESTS:  5 times sit to stand: 27.83 seconds  TREATMENT DATE:    12/11/23                                 EXERCISE LOG   LE's/ balance  Exercise Repetitions and Resistance Comments  Nustep  Lvl 3 x 15 mins   Heel/Toe Raises 2 mins   LAQs 3# x 3 mins alternating   Seated Marches 3# x 2.5 mins alternating   Seated Hip Abduction Red x 3 mins   Seated Hip Adduction 3 mins   Seated Ham Curls Red x 2 mins each   STS    Gait 51 ft no AD   Rocker board     Blank cell = exercise not performed today   11/01/23    EXERCISE LOG  Exercise Repetitions and Resistance Comments  Nustep  L3 x 16 minutes    Toe taps on step   6 step x 15 reps each  BUE support from parallel bars for safety   Side-stepping Airex x 4 mins   Tandem Gait Airex x 4 mins   Standing Marches  Airex x 2.5 mins BUE support from parallel bars for safety   Standing gastroc stretch   BUE support from parallel bars for safety   Standing heel raise   BUE support from parallel bars for safety   Seated marching  2# x 2.5 mins   LAQ 2# x 25 reps each    Seated ham Curls Red x 20 reps bil13   Sit to stand  12 reps  With UE support from armrests   Seated hip ADD isometric  3 minutes w/ 5 second hold    Blank cell = exercise not performed today   10/22/2023                                   EXERCISE LOG  Exercise Repetitions and Resistance Comments  Nustep Lvl 3 x 15 mins    Seated Marching 2# x 2 min    LAQ 2# x 3 mins    OH press w/med ball 2# 1x12    Seated chops 2x10 2# med ball  Core activation  Hip add/abd 3 min ea    Sit-stand 19.24 seconds x 5 reps Upright Posture    Blank cell = exercise not performed today    PATIENT EDUCATION: Education details:  Person educated: Patient Education method:  Explanation Education comprehension: verbalized understanding  HOME EXERCISE PROGRAM:   GOALS: Goals reviewed with patient? Yes  SHORT TERM GOALS: Target date: 10/24/23  Patient will be independent with her initial HEP.  Baseline: Goal status: MET  2.  Patient will improve her gait speed to at least 0.6 m/s for improved functional mobility.  Baseline: 8/18: .53 m/s Goal status: MET    .67  m/s  3.  Patient will improve her five time sit to stand time to 20 seconds or less for improved lower extremity power.  Baseline: 8/18: 19.24 seconds Goal status: MET  LONG TERM GOALS: Target date: 11/14/23  Patient will be independent with her advanced HEP.  Baseline:  Goal status: IN PROGRESS  2.  Patient will improve her gait speed to at least 0.8 m/s for improved household mobility.  Baseline:   On going Goal status: On going    .67  m/s  3.  Patient will improve her five time sit  to stand time to 15 seconds or less to reduce her fall risk.  Baseline: 14.78 seconds  Goal status: MET  4.  Patient will be able to transfer from sitting to standing without upper extremity support for improved independence.  Baseline:  Goal status: MET  ASSESSMENT:  CLINICAL IMPRESSION: Pt arrives for today's treatment session denying any pain,and was able to continue with therex and act.'s for balance and strength. She was able to meet all STG's today.   OBJECTIVE IMPAIRMENTS: Abnormal gait, decreased activity tolerance, decreased balance, decreased mobility, difficulty walking, decreased strength, and postural dysfunction.   ACTIVITY LIMITATIONS: standing, stairs, transfers, and locomotion level  PARTICIPATION LIMITATIONS: shopping and community activity  PERSONAL FACTORS: Age, Past/current experiences, Time since onset of injury/illness/exacerbation, Transportation, and 3+ comorbidities: HTN, atrial fibrillation, and history of a CVA, and allergies are also affecting patient's functional  outcome.   REHAB POTENTIAL: Good  CLINICAL DECISION MAKING: Evolving/moderate complexity  EVALUATION COMPLEXITY: Moderate  PLAN:  PT FREQUENCY: 2x/week  PT DURATION: 6 weeks  PLANNED INTERVENTIONS: 97164- PT Re-evaluation, 97750- Physical Performance Testing, 97110-Therapeutic exercises, 97530- Therapeutic activity, [id number]- Neuromuscular re-education, 97535- Self Care, [id number]- Manual therapy, [phone]- Gait training, Patient/Family education, Balance training, Stair training, Cryotherapy, and Moist heat  PLAN FOR NEXT SESSION: Nustep, lower extremity strengthening, gait training, and balance interventions   Frank Novelo,CHRIS, PTA 12/11/2023, 6:38 PM

## 2023-12-13 ENCOUNTER — Telehealth: Payer: Self-pay | Admitting: Nurse Practitioner

## 2023-12-13 NOTE — Telephone Encounter (Signed)
 I tried to call pt to let her know that she has some samples in cabinet here at office. N/A & v/m is FULL.

## 2023-12-17 ENCOUNTER — Other Ambulatory Visit: Payer: Self-pay | Admitting: Nurse Practitioner

## 2023-12-17 MED ORDER — ROSUVASTATIN CALCIUM 5 MG PO TABS: 5.0000 mg | ORAL_TABLET | Freq: Every day | ORAL | 0 refills | Status: DC

## 2023-12-17 NOTE — Telephone Encounter (Unsigned)
 Copied from CRM [id number]. Topic: Clinical - Medication Refill >> Dec 17, 2023 10:07 AM Angela Dawson wrote: Medication: rosuvastatin  (CRESTOR ) 5 MG tablet   Has the patient contacted their pharmacy? No (Agent: If no, request that the patient contact the pharmacy for the refill. If patient does not wish to contact the pharmacy document the reason why and proceed with request.) (Agent: If yes, when and what did the pharmacy advise?) No Refills  This is the patient's preferred pharmacy:  [id number] - [place], [place] - [address] [place] [address] Phone: [phone] Fax: [phone]  Is this the correct pharmacy for this prescription? Yes If no, delete pharmacy and type the correct one.   Has the prescription been filled recently? No  Is the patient out of the medication? No 1 Remaining  Has the patient been seen for an appointment in the last year OR does the patient have an upcoming appointment? Yes  Can we respond through MyChart? Yes  Agent: Please be advised that Rx refills may take up to 3 business days. We ask that you follow-up with your pharmacy.

## 2023-12-18 ENCOUNTER — Ambulatory Visit: Admitting: *Deleted

## 2023-12-18 DIAGNOSIS — Z9181 History of falling: Secondary | ICD-10-CM

## 2023-12-18 DIAGNOSIS — R2681 Unsteadiness on feet: Secondary | ICD-10-CM | POA: Diagnosis not present

## 2023-12-18 NOTE — Therapy (Signed)
 OUTPATIENT PHYSICAL THERAPY NEURO TREATMENT   Patient Name: Angela Dawson MRN: [id number] DOB:June 11, 1945, 78 y.o., female Today's Date: 12/18/2023   PCP: Gladis Mustard, FNP REFERRING PROVIDER: Rosemarie Eather RAMAN, MD   END OF SESSION:  PT End of Session - 12/18/23 1411     Visit Number 11    Number of Visits 13    Date for Recertification  11/30/23    Authorization - Number of Visits 13    PT Start Time 1345    PT Stop Time 1435    PT Time Calculation (min) 50 min            Past Medical History:  Diagnosis Date   Allergy    seasonal   Chicken pox    Complication of anesthesia    difficult to wake up after tubal ligations   Dyspnea    with exertion   Heart murmur    History of bronchitis    History of kidney stones    only one   Hypertension    Measles    Past Surgical History:  Procedure Laterality Date   AORTIC VALVE REPLACEMENT N/A 01/17/2016   Procedure: AORTIC VALVE REPLACEMENT (AVR) USING EDWARDS MAGNA EASE PERICARDIAL BIOPROSTHESIS VALVE;  Surgeon: Dallas KATHEE Jude, MD;  Location: [facility] OR;  Service: Open Heart Surgery;  Laterality: N/A;   CARDIAC CATHETERIZATION N/A 09/24/2015   Procedure: Right/Left Heart Cath and Coronary Angiography;  Surgeon: Candyce RAMAN Reek, MD;  Location: [facility] INVASIVE CV LAB;  Service: Cardiovascular;  Laterality: N/A;   COLONOSCOPY     REPLACEMENT ASCENDING AORTA N/A 01/17/2016   Procedure: REPLACEMENT ASCENDING AORTA USING HEMASHIELD PLATINUM WOVEN DOUBLE VELOUR VASCULAR GRAFT;  Surgeon: Dallas KATHEE Jude, MD;  Location: [facility] OR;  Service: Open Heart Surgery;  Laterality: N/A;   TEE WITHOUT CARDIOVERSION N/A 01/17/2016   Procedure: TRANSESOPHAGEAL ECHOCARDIOGRAM (TEE);  Surgeon: Dallas KATHEE Jude, MD;  Location: [facility] OR;  Service: Open Heart Surgery;  Laterality: N/A;   TUBAL LIGATION     Patient Active Problem List   Diagnosis Date Noted   Gout of right foot 07/02/2023   Unspecified protein-calorie malnutrition  07/01/2023   Neurocognitive deficits 07/01/2023   History of CVA (cerebrovascular accident) 06/28/2023   Aortic atherosclerosis 06/28/2023   Chronic nonseasonal allergic rhinitis due to pollen 06/28/2023   Acquired hypothyroidism 06/28/2023   New onset atrial fibrillation (HCC) 06/23/2023   Prolonged QT interval 06/23/2023   History of recent fall 06/23/2023   Falls 06/23/2023   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) 06/23/2023   B12 deficiency 06/23/2023   Statin intolerance 10/03/2019   S/P AVR (aortic valve replacement) 01/17/2016   Ascending aorta dilatation 11/20/2013   Hyperlipidemia 09/30/2013   Severe obesity (BMI >= 40) (HCC) 09/30/2013   Essential hypertension 07/17/2012    ONSET DATE: May 2025  REFERRING DIAG: Cerebrovascular accident (CVA) due to embolism of precerebral artery, Gait disturbance, post-stroke   THERAPY DIAG:  Unsteadiness on feet  History of falling  Rationale for Evaluation and Treatment: Rehabilitation  SUBJECTIVE:  SUBJECTIVE STATEMENT: Pt denies any pain today and did good after last Rx. Cleared by MD and DC HEP     Pt accompanied by: self  PERTINENT HISTORY: HTN, atrial fibrillation, and history of a CVA, and allergies  PAIN:  Are you having pain? No  PRECAUTIONS: Fall  RED FLAGS: None   WEIGHT BEARING RESTRICTIONS: No  FALLS: Has patient fallen in last 6 months? Yes. Number of falls 2  LIVING ENVIRONMENT: Lives with: lives alone and has a caregiver with her most of the time Lives in: House/apartment Stairs: Yes: External: 1 steps; none Has following equipment at home: Walker - 2 wheeled  PLOF: Independent  PATIENT GOALS: improved balance and safety  OBJECTIVE:  Note: Objective measures were completed at Evaluation unless  otherwise noted.  DIAGNOSTIC FINDINGS: 06/23/23 brain MRI MPRESSION: 1. Positive for a Recent Embolic Event, with scattered small subacute infarcts in the anterior and posterior circulation. Notable involvement of the left lateral medulla, the central pons. No associated hemorrhage or mass effect. 2. Underlying fairly severe chronic small vessel disease, including a small number of chronic microhemorrhages. COGNITION: Overall cognitive status: Within functional limits for tasks assessed   SENSATION: Patient reports numbness around her lips on the left side of her face.   POSTURE: flexed trunk   LOWER EXTREMITY ROM: WFL for activities assessed  UPPER EXTREMITY MMT:  MMT Right eval Left eval  Shoulder flexion 4/5 4/5  Shoulder extension    Shoulder abduction 3+/5 3+/5  Shoulder adduction    Shoulder extension    Shoulder internal rotation    Shoulder external rotation    Middle trapezius    Lower trapezius    Elbow flexion 4/5 4/5  Elbow extension 3+/5 3+/5  Wrist flexion    Wrist extension    Wrist ulnar deviation    Wrist radial deviation    Wrist pronation    Wrist supination    Grip strength 20 20   (Blank rows = not tested)   LOWER EXTREMITY MMT:    MMT Right Eval Left Eval  Hip flexion 3+/5 3+/5  Hip extension    Hip abduction    Hip adduction    Hip internal rotation    Hip external rotation    Knee flexion 5/5 4/5; slight calf pain  Knee extension 4/5 4+/5  Ankle dorsiflexion 4-/5 4-/5  Ankle plantarflexion    Ankle inversion    Ankle eversion    (Blank rows = not tested)  BED MOBILITY:  Not tested  TRANSFERS: Sit to stand: Modified independence  Assistive device utilized: Environmental consultant - 2 wheeled     Stand to sit: Modified independence  Assistive device utilized: Environmental consultant - 2 wheeled      GAIT: Findings: Gait Characteristics: step through pattern, decreased stride length, trunk flexed, poor foot clearance- Right, and poor foot clearance- Left,  Assistive device utilized:Walker - 2 wheeled, and Level of assistance: Modified independence Gait speed: 0.43 m/s  FUNCTIONAL TESTS:  5 times sit to stand: 27.83 seconds  TREATMENT DATE:    12/18/23                                 EXERCISE LOG   LE's/ balance  Exercise Repetitions and Resistance Comments  Nustep  Lvl 3 x 18 mins   Heel/Toe Raises 2 mins   LAQs 3# x 3 mins alternating   Seated Marches 3# x 3 mins alternating   Seated Hip Abduction Red x 3 mins   Seated Hip Adduction 3 mins   Seated Ham Curls Red x 3 mins each   STS    Gait 11.2 meters    .62  m/s   Rocker board     Blank cell = exercise not performed today   11/01/23    EXERCISE LOG  Exercise Repetitions and Resistance Comments  Nustep  L3 x 16 minutes    Toe taps on step   6 step x 15 reps each  BUE support from parallel bars for safety   Side-stepping Airex x 4 mins   Tandem Gait Airex x 4 mins   Standing Marches  Airex x 2.5 mins BUE support from parallel bars for safety   Standing gastroc stretch   BUE support from parallel bars for safety   Standing heel raise   BUE support from parallel bars for safety   Seated marching  2# x 2.5 mins   LAQ 2# x 25 reps each    Seated ham Curls Red x 20 reps bil13   Sit to stand  12 reps  With UE support from armrests   Seated hip ADD isometric  3 minutes w/ 5 second hold    Blank cell = exercise not performed today   10/22/2023                                   EXERCISE LOG  Exercise Repetitions and Resistance Comments  Nustep Lvl 3 x 15 mins    Seated Marching 2# x 2 min    LAQ 2# x 3 mins    OH press w/med ball 2# 1x12    Seated chops 2x10 2# med ball  Core activation  Hip add/abd 3 min ea    Sit-stand 19.24 seconds x 5 reps Upright Posture    Blank cell = exercise not performed today    PATIENT EDUCATION: Education details:  Person educated: Patient Education  method: Explanation Education comprehension: verbalized understanding  HOME EXERCISE PROGRAM:   GOALS: Goals reviewed with patient? Yes  SHORT TERM GOALS: Target date: 10/24/23  Patient will be independent with her initial HEP.  Baseline: Goal status: MET  2.  Patient will improve her gait speed to at least 0.6 m/s for improved functional mobility.  Baseline: 8/18: .53 m/s Goal status: MET    .67  m/s  3.  Patient will improve her five time sit to stand time to 20 seconds or less for improved lower extremity power.  Baseline: 8/18: 19.24 seconds Goal status: MET  LONG TERM GOALS: Target date: 11/14/23  Patient will be independent with her advanced HEP.  Baseline:  Goal status: IN PROGRESS  2.  Patient will improve her gait speed to at least 0.8 m/s for improved household mobility.  Baseline:   On going Goal status: On going    .62  m/s  3.  Patient will improve  her five time sit to stand time to 15 seconds or less to reduce her fall risk.  Baseline: 14.78 seconds  Goal status: MET  4.  Patient will be able to transfer from sitting to standing without upper extremity support for improved independence.  Baseline:  Goal status: MET  ASSESSMENT:  CLINICAL IMPRESSION: 2 visits  left Pt arrives for today's treatment session doing fairly well, but some SOB. She was able to continue with therex strengthening seated as well as gait.  She continues to tolerate therex faily well and progress towards goals. Her gait speed end of session was about the same today  .62 M/S   OBJECTIVE IMPAIRMENTS: Abnormal gait, decreased activity tolerance, decreased balance, decreased mobility, difficulty walking, decreased strength, and postural dysfunction.   ACTIVITY LIMITATIONS: standing, stairs, transfers, and locomotion level  PARTICIPATION LIMITATIONS: shopping and community activity  PERSONAL FACTORS: Age, Past/current experiences, Time since onset of injury/illness/exacerbation,  Transportation, and 3+ comorbidities: HTN, atrial fibrillation, and history of a CVA, and allergies are also affecting patient's functional outcome.   REHAB POTENTIAL: Good  CLINICAL DECISION MAKING: Evolving/moderate complexity  EVALUATION COMPLEXITY: Moderate  PLAN:  PT FREQUENCY: 2x/week  PT DURATION: 6 weeks  PLANNED INTERVENTIONS: 97164- PT Re-evaluation, 97750- Physical Performance Testing, 97110-Therapeutic exercises, 97530- Therapeutic activity, [id number]- Neuromuscular re-education, 97535- Self Care, 02859- Manual therapy, [phone]- Gait training, Patient/Family education, Balance training, Stair training, Cryotherapy, and Moist heat  PLAN FOR NEXT SESSION: Nustep, lower extremity strengthening, gait training, and balance interventions   Renaud Celli,CHRIS, PTA 12/18/2023, 3:34 PM

## 2023-12-24 ENCOUNTER — Other Ambulatory Visit: Payer: Self-pay | Admitting: Cardiology

## 2023-12-24 ENCOUNTER — Ambulatory Visit: Attending: Cardiology

## 2023-12-24 DIAGNOSIS — Z952 Presence of prosthetic heart valve: Secondary | ICD-10-CM | POA: Diagnosis not present

## 2023-12-24 DIAGNOSIS — E782 Mixed hyperlipidemia: Secondary | ICD-10-CM

## 2023-12-24 DIAGNOSIS — I4891 Unspecified atrial fibrillation: Secondary | ICD-10-CM | POA: Diagnosis not present

## 2023-12-24 DIAGNOSIS — R0609 Other forms of dyspnea: Secondary | ICD-10-CM

## 2023-12-24 DIAGNOSIS — D6869 Other thrombophilia: Secondary | ICD-10-CM

## 2023-12-24 MED ORDER — PERFLUTREN LIPID MICROSPHERE
1.0000 mL | INTRAVENOUS | Status: AC | PRN
  Administered 2023-12-24: 6 mL via INTRAVENOUS

## 2023-12-25 ENCOUNTER — Ambulatory Visit

## 2023-12-25 DIAGNOSIS — R2681 Unsteadiness on feet: Secondary | ICD-10-CM

## 2023-12-25 DIAGNOSIS — Z9181 History of falling: Secondary | ICD-10-CM

## 2023-12-25 LAB — ECHOCARDIOGRAM COMPLETE
AV Mean grad: 28 mmHg
AV Peak grad: 51 mmHg
AV Vena cont: 0.4 cm
Ao pk vel: 3.57 m/s
Area-P 1/2: 2.79 cm2
Calc EF: 75.9 %
P 1/2 time: 571 ms
S' Lateral: 2.3 cm
Single Plane A2C EF: 64.1 %
Single Plane A4C EF: 83.5 %

## 2023-12-25 NOTE — Therapy (Signed)
 OUTPATIENT PHYSICAL THERAPY NEURO TREATMENT   Patient Name: Angela Dawson MRN: [id number] DOB:03-27-1945, 78 y.o., female Today's Date: 12/25/2023   PCP: Gladis Mustard, FNP REFERRING PROVIDER: Rosemarie Eather RAMAN, MD   END OF SESSION:  PT End of Session - 12/25/23 1356     Visit Number 12    Number of Visits 13    Date for Recertification  11/30/23    Authorization - Number of Visits 13    PT Start Time 1353    PT Stop Time 1432    PT Time Calculation (min) 39 min            Past Medical History:  Diagnosis Date   Allergy    seasonal   Chicken pox    Complication of anesthesia    difficult to wake up after tubal ligations   Dyspnea    with exertion   Heart murmur    History of bronchitis    History of kidney stones    only one   Hypertension    Measles    Past Surgical History:  Procedure Laterality Date   AORTIC VALVE REPLACEMENT N/A 01/17/2016   Procedure: AORTIC VALVE REPLACEMENT (AVR) USING EDWARDS MAGNA EASE PERICARDIAL BIOPROSTHESIS VALVE;  Surgeon: Dallas KATHEE Jude, MD;  Location: [facility] OR;  Service: Open Heart Surgery;  Laterality: N/A;   CARDIAC CATHETERIZATION N/A 09/24/2015   Procedure: Right/Left Heart Cath and Coronary Angiography;  Surgeon: Candyce RAMAN Reek, MD;  Location: [facility] INVASIVE CV LAB;  Service: Cardiovascular;  Laterality: N/A;   COLONOSCOPY     REPLACEMENT ASCENDING AORTA N/A 01/17/2016   Procedure: REPLACEMENT ASCENDING AORTA USING HEMASHIELD PLATINUM WOVEN DOUBLE VELOUR VASCULAR GRAFT;  Surgeon: Dallas KATHEE Jude, MD;  Location: [facility] OR;  Service: Open Heart Surgery;  Laterality: N/A;   TEE WITHOUT CARDIOVERSION N/A 01/17/2016   Procedure: TRANSESOPHAGEAL ECHOCARDIOGRAM (TEE);  Surgeon: Dallas KATHEE Jude, MD;  Location: [facility] OR;  Service: Open Heart Surgery;  Laterality: N/A;   TUBAL LIGATION     Patient Active Problem List   Diagnosis Date Noted   Gout of right foot 07/02/2023   Unspecified protein-calorie malnutrition  07/01/2023   Neurocognitive deficits 07/01/2023   History of CVA (cerebrovascular accident) 06/28/2023   Aortic atherosclerosis 06/28/2023   Chronic nonseasonal allergic rhinitis due to pollen 06/28/2023   Acquired hypothyroidism 06/28/2023   New onset atrial fibrillation (HCC) 06/23/2023   Prolonged QT interval 06/23/2023   History of recent fall 06/23/2023   Falls 06/23/2023   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) 06/23/2023   B12 deficiency 06/23/2023   Statin intolerance 10/03/2019   S/P AVR (aortic valve replacement) 01/17/2016   Ascending aorta dilatation 11/20/2013   Hyperlipidemia 09/30/2013   Severe obesity (BMI >= 40) (HCC) 09/30/2013   Essential hypertension 07/17/2012    ONSET DATE: May 2025  REFERRING DIAG: Cerebrovascular accident (CVA) due to embolism of precerebral artery, Gait disturbance, post-stroke   THERAPY DIAG:  Unsteadiness on feet  History of falling  Rationale for Evaluation and Treatment: Rehabilitation  SUBJECTIVE:  SUBJECTIVE STATEMENT: Pt denies any pain today and did good after last Rx. Cleared by MD and DC HEP     Pt accompanied by: self  PERTINENT HISTORY: HTN, atrial fibrillation, and history of a CVA, and allergies  PAIN:  Are you having pain? No  PRECAUTIONS: Fall  RED FLAGS: None   WEIGHT BEARING RESTRICTIONS: No  FALLS: Has patient fallen in last 6 months? Yes. Number of falls 2  LIVING ENVIRONMENT: Lives with: lives alone and has a caregiver with her most of the time Lives in: House/apartment Stairs: Yes: External: 1 steps; none Has following equipment at home: Walker - 2 wheeled  PLOF: Independent  PATIENT GOALS: improved balance and safety  OBJECTIVE:  Note: Objective measures were completed at Evaluation unless  otherwise noted.  DIAGNOSTIC FINDINGS: 06/23/23 brain MRI MPRESSION: 1. Positive for a Recent Embolic Event, with scattered small subacute infarcts in the anterior and posterior circulation. Notable involvement of the left lateral medulla, the central pons. No associated hemorrhage or mass effect. 2. Underlying fairly severe chronic small vessel disease, including a small number of chronic microhemorrhages. COGNITION: Overall cognitive status: Within functional limits for tasks assessed   SENSATION: Patient reports numbness around her lips on the left side of her face.   POSTURE: flexed trunk   LOWER EXTREMITY ROM: WFL for activities assessed  UPPER EXTREMITY MMT:  MMT Right eval Left eval  Shoulder flexion 4/5 4/5  Shoulder extension    Shoulder abduction 3+/5 3+/5  Shoulder adduction    Shoulder extension    Shoulder internal rotation    Shoulder external rotation    Middle trapezius    Lower trapezius    Elbow flexion 4/5 4/5  Elbow extension 3+/5 3+/5  Wrist flexion    Wrist extension    Wrist ulnar deviation    Wrist radial deviation    Wrist pronation    Wrist supination    Grip strength 20 20   (Blank rows = not tested)   LOWER EXTREMITY MMT:    MMT Right Eval Left Eval  Hip flexion 3+/5 3+/5  Hip extension    Hip abduction    Hip adduction    Hip internal rotation    Hip external rotation    Knee flexion 5/5 4/5; slight calf pain  Knee extension 4/5 4+/5  Ankle dorsiflexion 4-/5 4-/5  Ankle plantarflexion    Ankle inversion    Ankle eversion    (Blank rows = not tested)  BED MOBILITY:  Not tested  TRANSFERS: Sit to stand: Modified independence  Assistive device utilized: Environmental consultant - 2 wheeled     Stand to sit: Modified independence  Assistive device utilized: Environmental consultant - 2 wheeled      GAIT: Findings: Gait Characteristics: step through pattern, decreased stride length, trunk flexed, poor foot clearance- Right, and poor foot clearance- Left,  Assistive device utilized:Walker - 2 wheeled, and Level of assistance: Modified independence Gait speed: 0.43 m/s  FUNCTIONAL TESTS:  5 times sit to stand: 27.83 seconds  TREATMENT DATE:    12/25/23                                 EXERCISE LOG   LE's/ balance  Exercise Repetitions and Resistance Comments  Nustep  Lvl 4-3 x 18 mins   Heel/Toe Raises 2 mins   LAQs 4# x 3 mins alternating   Seated Marches 4# x 3 mins alternating   Seated Hip Abduction Green x 3 mins   Seated Hip Adduction 3 mins   Seated Ham Curls Green x 3 mins each   STS    Gait    Rocker board     Blank cell = exercise not performed today   11/01/23    EXERCISE LOG  Exercise Repetitions and Resistance Comments  Nustep  L3 x 16 minutes    Toe taps on step   6 step x 15 reps each  BUE support from parallel bars for safety   Side-stepping Airex x 4 mins   Tandem Gait Airex x 4 mins   Standing Marches  Airex x 2.5 mins BUE support from parallel bars for safety   Standing gastroc stretch   BUE support from parallel bars for safety   Standing heel raise   BUE support from parallel bars for safety   Seated marching  2# x 2.5 mins   LAQ 2# x 25 reps each    Seated ham Curls Red x 20 reps bil13   Sit to stand  12 reps  With UE support from armrests   Seated hip ADD isometric  3 minutes w/ 5 second hold    Blank cell = exercise not performed today   10/22/2023                                   EXERCISE LOG  Exercise Repetitions and Resistance Comments  Nustep Lvl 3 x 15 mins    Seated Marching 2# x 2 min    LAQ 2# x 3 mins    OH press w/med ball 2# 1x12    Seated chops 2x10 2# med ball  Core activation  Hip add/abd 3 min ea    Sit-stand 19.24 seconds x 5 reps Upright Posture    Blank cell = exercise not performed today    PATIENT EDUCATION: Education details:  Person educated: Patient Education method:  Explanation Education comprehension: verbalized understanding  HOME EXERCISE PROGRAM:   GOALS: Goals reviewed with patient? Yes  SHORT TERM GOALS: Target date: 10/24/23  Patient will be independent with her initial HEP.  Baseline: Goal status: MET  2.  Patient will improve her gait speed to at least 0.6 m/s for improved functional mobility.  Baseline: 8/18: .53 m/s Goal status: MET    .67  m/s  3.  Patient will improve her five time sit to stand time to 20 seconds or less for improved lower extremity power.  Baseline: 8/18: 19.24 seconds Goal status: MET  LONG TERM GOALS: Target date: 11/14/23  Patient will be independent with her advanced HEP.  Baseline:  Goal status: IN PROGRESS  2.  Patient will improve her gait speed to at least 0.8 m/s for improved household mobility.  Baseline:   On going Goal status: On going    .62  m/s  3.  Patient will improve her five time sit to stand time  to 15 seconds or less to reduce her fall risk.  Baseline: 14.78 seconds  Goal status: MET  4.  Patient will be able to transfer from sitting to standing without upper extremity support for improved independence.  Baseline:  Goal status: MET  ASSESSMENT:  CLINICAL IMPRESSION:  Pt arrives for today's treatment session doing fairly well, but some SOB.  Pt able to tolerate increased weight or resistance with all previously performed exercises.  Pt will be ready for discharge at next treatment session.  Pt denied any pain at completion of today's treatment session.  OBJECTIVE IMPAIRMENTS: Abnormal gait, decreased activity tolerance, decreased balance, decreased mobility, difficulty walking, decreased strength, and postural dysfunction.   ACTIVITY LIMITATIONS: standing, stairs, transfers, and locomotion level  PARTICIPATION LIMITATIONS: shopping and community activity  PERSONAL FACTORS: Age, Past/current experiences, Time since onset of injury/illness/exacerbation, Transportation, and 3+  comorbidities: HTN, atrial fibrillation, and history of a CVA, and allergies are also affecting patient's functional outcome.   REHAB POTENTIAL: Good  CLINICAL DECISION MAKING: Evolving/moderate complexity  EVALUATION COMPLEXITY: Moderate  PLAN:  PT FREQUENCY: 2x/week  PT DURATION: 6 weeks  PLANNED INTERVENTIONS: 97164- PT Re-evaluation, 97750- Physical Performance Testing, 97110-Therapeutic exercises, 97530- Therapeutic activity, 97112- Neuromuscular re-education, 97535- Self Care, [id number]- Manual therapy, [phone]- Gait training, Patient/Family education, Balance training, Stair training, Cryotherapy, and Moist heat  PLAN FOR NEXT SESSION: Nustep, lower extremity strengthening, gait training, and balance interventions   Delon DELENA Gosling, PTA 12/25/2023, 2:48 PM

## 2024-01-01 ENCOUNTER — Ambulatory Visit: Admitting: *Deleted

## 2024-01-01 ENCOUNTER — Encounter: Payer: Self-pay | Admitting: *Deleted

## 2024-01-01 DIAGNOSIS — R2681 Unsteadiness on feet: Secondary | ICD-10-CM | POA: Diagnosis not present

## 2024-01-01 DIAGNOSIS — Z9181 History of falling: Secondary | ICD-10-CM

## 2024-01-01 NOTE — Therapy (Addendum)
 OUTPATIENT PHYSICAL THERAPY NEURO TREATMENT   Patient Name: Angela Dawson MRN: [id number] DOB:Oct 08, 1945, 78 y.o., female Today's Date: 01/01/2024   PCP: Gladis Mustard, FNP REFERRING PROVIDER: Rosemarie Eather RAMAN, MD   END OF SESSION:  PT End of Session - 01/01/24 1354     Visit Number 13    Number of Visits 13    Date for Recertification  11/30/23    Authorization - Number of Visits 13    PT Start Time 1345    PT Stop Time 1435    PT Time Calculation (min) 50 min            Past Medical History:  Diagnosis Date   Allergy    seasonal   Chicken pox    Complication of anesthesia    difficult to wake up after tubal ligations   Dyspnea    with exertion   Heart murmur    History of bronchitis    History of kidney stones    only one   Hypertension    Measles    Past Surgical History:  Procedure Laterality Date   AORTIC VALVE REPLACEMENT N/A 01/17/2016   Procedure: AORTIC VALVE REPLACEMENT (AVR) USING EDWARDS MAGNA EASE PERICARDIAL BIOPROSTHESIS VALVE;  Surgeon: Dallas KATHEE Jude, MD;  Location: [facility] OR;  Service: Open Heart Surgery;  Laterality: N/A;   CARDIAC CATHETERIZATION N/A 09/24/2015   Procedure: Right/Left Heart Cath and Coronary Angiography;  Surgeon: Candyce RAMAN Reek, MD;  Location: [facility] INVASIVE CV LAB;  Service: Cardiovascular;  Laterality: N/A;   COLONOSCOPY     REPLACEMENT ASCENDING AORTA N/A 01/17/2016   Procedure: REPLACEMENT ASCENDING AORTA USING HEMASHIELD PLATINUM WOVEN DOUBLE VELOUR VASCULAR GRAFT;  Surgeon: Dallas KATHEE Jude, MD;  Location: [facility] OR;  Service: Open Heart Surgery;  Laterality: N/A;   TEE WITHOUT CARDIOVERSION N/A 01/17/2016   Procedure: TRANSESOPHAGEAL ECHOCARDIOGRAM (TEE);  Surgeon: Dallas KATHEE Jude, MD;  Location: [facility] OR;  Service: Open Heart Surgery;  Laterality: N/A;   TUBAL LIGATION     Patient Active Problem List   Diagnosis Date Noted   Gout of right foot 07/02/2023   Unspecified protein-calorie malnutrition  07/01/2023   Neurocognitive deficits 07/01/2023   History of CVA (cerebrovascular accident) 06/28/2023   Aortic atherosclerosis 06/28/2023   Chronic nonseasonal allergic rhinitis due to pollen 06/28/2023   Acquired hypothyroidism 06/28/2023   New onset atrial fibrillation (HCC) 06/23/2023   Prolonged QT interval 06/23/2023   History of recent fall 06/23/2023   Falls 06/23/2023   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) 06/23/2023   B12 deficiency 06/23/2023   Statin intolerance 10/03/2019   S/P AVR (aortic valve replacement) 01/17/2016   Ascending aorta dilatation 11/20/2013   Hyperlipidemia 09/30/2013   Severe obesity (BMI >= 40) (HCC) 09/30/2013   Essential hypertension 07/17/2012    ONSET DATE: May 2025  REFERRING DIAG: Cerebrovascular accident (CVA) due to embolism of precerebral artery, Gait disturbance, post-stroke   THERAPY DIAG:  Unsteadiness on feet  History of falling  Rationale for Evaluation and Treatment: Rehabilitation  SUBJECTIVE:  SUBJECTIVE STATEMENT: Pt denies any pain today and did good after last Rx. Cleared by MD and DC HEP     Pt accompanied by: self  PERTINENT HISTORY: HTN, atrial fibrillation, and history of a CVA, and allergies  PAIN:  Are you having pain? No  PRECAUTIONS: Fall  RED FLAGS: None   WEIGHT BEARING RESTRICTIONS: No  FALLS: Has patient fallen in last 6 months? Yes. Number of falls 2  LIVING ENVIRONMENT: Lives with: lives alone and has a caregiver with her most of the time Lives in: House/apartment Stairs: Yes: External: 1 steps; none Has following equipment at home: Walker - 2 wheeled  PLOF: Independent  PATIENT GOALS: improved balance and safety  OBJECTIVE:  Note: Objective measures were completed at Evaluation unless  otherwise noted.  DIAGNOSTIC FINDINGS: 06/23/23 brain MRI MPRESSION: 1. Positive for a Recent Embolic Event, with scattered small subacute infarcts in the anterior and posterior circulation. Notable involvement of the left lateral medulla, the central pons. No associated hemorrhage or mass effect. 2. Underlying fairly severe chronic small vessel disease, including a small number of chronic microhemorrhages. COGNITION: Overall cognitive status: Within functional limits for tasks assessed   SENSATION: Patient reports numbness around her lips on the left side of her face.   POSTURE: flexed trunk   LOWER EXTREMITY ROM: WFL for activities assessed  UPPER EXTREMITY MMT:  MMT Right eval Left eval  Shoulder flexion 4/5 4/5  Shoulder extension    Shoulder abduction 3+/5 3+/5  Shoulder adduction    Shoulder extension    Shoulder internal rotation    Shoulder external rotation    Middle trapezius    Lower trapezius    Elbow flexion 4/5 4/5  Elbow extension 3+/5 3+/5  Wrist flexion    Wrist extension    Wrist ulnar deviation    Wrist radial deviation    Wrist pronation    Wrist supination    Grip strength 20 20   (Blank rows = not tested)   LOWER EXTREMITY MMT:    MMT Right Eval Left Eval  Hip flexion 3+/5 3+/5  Hip extension    Hip abduction    Hip adduction    Hip internal rotation    Hip external rotation    Knee flexion 5/5 4/5; slight calf pain  Knee extension 4/5 4+/5  Ankle dorsiflexion 4-/5 4-/5  Ankle plantarflexion    Ankle inversion    Ankle eversion    (Blank rows = not tested)  BED MOBILITY:  Not tested  TRANSFERS: Sit to stand: Modified independence  Assistive device utilized: Environmental Consultant - 2 wheeled     Stand to sit: Modified independence  Assistive device utilized: Environmental Consultant - 2 wheeled      GAIT: Findings: Gait Characteristics: step through pattern, decreased stride length, trunk flexed, poor foot clearance- Right, and poor foot clearance- Left,  Assistive device utilized:Walker - 2 wheeled, and Level of assistance: Modified independence Gait speed: 0.43 m/s  FUNCTIONAL TESTS:  5 times sit to stand: 27.83 seconds  TREATMENT DATE:    01/01/24                                 EXERCISE LOG   LE's/ balance  Exercise Repetitions and Resistance Comments  Nustep  Lvl 4-3 x 18 mins   Heel/Toe Raises 2 mins  HEP   LAQs 4# x 3 mins alternating   Seated Marches 4# x 3 mins alternating   Seated Hip Abduction Green x 3 mins   HEP   Seated Hip Adduction 3 mins    HEP   Seated Ham Curls Green x 3 mins each   STS    Gait With SBQC in clinic with SBA/CGA x 100 ft   Rocker board    Goal assessment See below    Blank cell = exercise not performed today   11/01/23    EXERCISE LOG  Exercise Repetitions and Resistance Comments  Nustep  L3 x 16 minutes    Toe taps on step   6 step x 15 reps each  BUE support from parallel bars for safety   Side-stepping Airex x 4 mins   Tandem Gait Airex x 4 mins   Standing Marches  Airex x 2.5 mins BUE support from parallel bars for safety   Standing gastroc stretch   BUE support from parallel bars for safety   Standing heel raise   BUE support from parallel bars for safety   Seated marching  2# x 2.5 mins   LAQ 2# x 25 reps each    Seated ham Curls Red x 20 reps bil13   Sit to stand  12 reps  With UE support from armrests   Seated hip ADD isometric  3 minutes w/ 5 second hold    Blank cell = exercise not performed today   10/22/2023                                   EXERCISE LOG  Exercise Repetitions and Resistance Comments  Nustep Lvl 3 x 15 mins    Seated Marching 2# x 2 min    LAQ 2# x 3 mins    OH press w/med ball 2# 1x12    Seated chops 2x10 2# med ball  Core activation  Hip add/abd 3 min ea    Sit-stand 19.24 seconds x 5 reps Upright Posture    Blank cell = exercise not performed today     PATIENT EDUCATION: Education details:  Person educated: Patient Education method: Explanation Education comprehension: verbalized understanding  HOME EXERCISE PROGRAM:   GOALS: Goals reviewed with patient? Yes  SHORT TERM GOALS: Target date: 10/24/23  Patient will be independent with her initial HEP.  Baseline: Goal status: MET  2.  Patient will improve her gait speed to at least 0.6 m/s for improved functional mobility.  Baseline: 8/18: .53 m/s Goal status: MET    .67  m/s  3.  Patient will improve her five time sit to stand time to 20 seconds or less for improved lower extremity power.  Baseline: 8/18: 19.24 seconds Goal status: MET  LONG TERM GOALS: Target date: 11/14/23  Patient will be independent with her advanced HEP.  Baseline:  Goal status: MET  2.  Patient will improve her gait speed to at least 0.8 m/s for improved household mobility.  Baseline:   11.57m Goal status:   .  68  m/s  NM  3.  Patient will improve her five time sit to stand time to 15 seconds or less to reduce her fall risk.  Baseline: 14.78 seconds  Goal status: MET  4.  Patient will be able to transfer from sitting to standing without upper extremity support for improved independence.  Baseline:  Goal status: MET  ASSESSMENT:  CLINICAL IMPRESSION:  Pt arrives for today's treatment session doing fairly well. She was able to continue with LE exs and balance act's for HEP. She has met 2 out of 4 LTGs and will be Dc at this time. Pt ambulated x 131ft with SBQC in clinic , but needed SBA/ CGA for balance deficits and was advised to continue using FWW at this time.      OBJECTIVE IMPAIRMENTS: Abnormal gait, decreased activity tolerance, decreased balance, decreased mobility, difficulty walking, decreased strength, and postural dysfunction.   ACTIVITY LIMITATIONS: standing, stairs, transfers, and locomotion level  PARTICIPATION LIMITATIONS: shopping and community activity  PERSONAL  FACTORS: Age, Past/current experiences, Time since onset of injury/illness/exacerbation, Transportation, and 3+ comorbidities: HTN, atrial fibrillation, and history of a CVA, and allergies are also affecting patient's functional outcome.   REHAB POTENTIAL: Good  CLINICAL DECISION MAKING: Evolving/moderate complexity  EVALUATION COMPLEXITY: Moderate  PLAN:  PT FREQUENCY: 2x/week  PT DURATION: 6 weeks  PLANNED INTERVENTIONS: 97164- PT Re-evaluation, 97750- Physical Performance Testing, 97110-Therapeutic exercises, 97530- Therapeutic activity, [id number]- Neuromuscular re-education, 97535- Self Care, 02859- Manual therapy, [phone]- Gait training, Patient/Family education, Balance training, Stair training, Cryotherapy, and Moist heat  PLAN  DC to HEP   Eyad Rochford,CHRIS, PTA 01/01/2024, 4:13 PM

## 2024-01-16 ENCOUNTER — Telehealth: Payer: Self-pay | Admitting: Cardiology

## 2024-01-16 MED ORDER — FUROSEMIDE 20 MG PO TABS: 20.0000 mg | ORAL_TABLET | Freq: Every day | ORAL | 5 refills | Status: AC | PRN

## 2024-01-16 NOTE — Telephone Encounter (Signed)
 Can try lasix  20mg  prn swelling as well. Agree cutting back on sodium  JINNY Ross MD

## 2024-01-16 NOTE — Telephone Encounter (Signed)
 Pt c/o swelling/edema: STAT if pt has developed SOB within 24 hours  If swelling, where is the swelling located? Feet (both)   How much weight have you gained and in what time span? no  Have you gained 2 pounds in a day or 5 pounds in a week? no  Do you have a log of your daily weights (if so, list)? no  Are you currently taking a fluid pill? no  Are you currently SOB? No   Have you traveled recently in a car or plane for an extended period of time? No

## 2024-01-16 NOTE — Telephone Encounter (Signed)
 Called patient advised her of Dr. Ranae recommendations. Sent Lasix  0 mg to take daily as needed for swelling. Patient verbalized understanding

## 2024-01-16 NOTE — Telephone Encounter (Signed)
 Spoke with patient stated that her feet and ankles started a couple of days ago. Swelling going from her ankles to her toes. Doesn't hurt, but kinda feels tingly. She slept in her socks and the seam line left an indention in her leg where her ankles were swollen. Reported that she had been elevating them, the left feels better than her right since she elevated them. The only change in diet she has been eating TV dinners which have a lot of sodium in them. But has been drinking a lot of water as well. She checked her BP reported that it was normal, but couldn't remember what it was. Reports no other symptoms, such as Chest Pain, SOB or dizziness. Only other symptom she reported is that her feet were cold. Advised to watch salt intake, elevate when she can and if symptoms or swelling get worse will need to go to ER for evaluation. Patient verbalized understanding. Routed to provider for review.

## 2024-02-22 ENCOUNTER — Ambulatory Visit: Payer: Self-pay

## 2024-02-22 ENCOUNTER — Encounter: Payer: Self-pay | Admitting: Nurse Practitioner

## 2024-02-22 ENCOUNTER — Ambulatory Visit: Payer: Self-pay | Admitting: Nurse Practitioner

## 2024-02-22 VITALS — BP 136/78 | HR 76 | Temp 97.2°F | Ht <= 58 in | Wt 241.0 lb

## 2024-02-22 VITALS — BP 136/76 | HR 79 | Ht <= 58 in | Wt 226.0 lb

## 2024-02-22 DIAGNOSIS — I10 Essential (primary) hypertension: Secondary | ICD-10-CM

## 2024-02-22 DIAGNOSIS — E039 Hypothyroidism, unspecified: Secondary | ICD-10-CM | POA: Diagnosis not present

## 2024-02-22 DIAGNOSIS — E538 Deficiency of other specified B group vitamins: Secondary | ICD-10-CM

## 2024-02-22 DIAGNOSIS — Z23 Encounter for immunization: Secondary | ICD-10-CM

## 2024-02-22 DIAGNOSIS — Z13820 Encounter for screening for osteoporosis: Secondary | ICD-10-CM

## 2024-02-22 DIAGNOSIS — E782 Mixed hyperlipidemia: Secondary | ICD-10-CM | POA: Diagnosis not present

## 2024-02-22 DIAGNOSIS — Z0000 Encounter for general adult medical examination without abnormal findings: Secondary | ICD-10-CM | POA: Diagnosis not present

## 2024-02-22 DIAGNOSIS — N393 Stress incontinence (female) (male): Secondary | ICD-10-CM | POA: Diagnosis not present

## 2024-02-22 DIAGNOSIS — I77810 Thoracic aortic ectasia: Secondary | ICD-10-CM

## 2024-02-22 DIAGNOSIS — Z1231 Encounter for screening mammogram for malignant neoplasm of breast: Secondary | ICD-10-CM

## 2024-02-22 LAB — LIPID PANEL

## 2024-02-22 MED ORDER — MIRABEGRON ER 50 MG PO TB24: 50.0000 mg | ORAL_TABLET | Freq: Every day | ORAL | 1 refills | Status: AC

## 2024-02-22 NOTE — Addendum Note (Signed)
 Addended by: VIKTORIA ALAN MATSU on: 02/22/2024 04:06 PM   Modules accepted: Orders

## 2024-02-22 NOTE — Progress Notes (Signed)
 "  Chief Complaint  Patient presents with   Medicare Wellness     Subjective:   Angela Dawson is a 78 y.o. female who presents for a Medicare Annual Wellness Visit.  Visit info / Clinical Intake: Medicare Wellness Visit Type:: Subsequent Annual Wellness Visit Persons participating in visit and providing information:: patient Medicare Wellness Visit Mode:: Telephone If telephone:: video declined Since this visit was completed virtually, some vitals may be partially provided or unavailable. Missing vitals are due to the limitations of the virtual format.: Unable to obtain vitals - no equipment If Telephone or Video please confirm:: I connected with patient using audio/video enable telemedicine. I verified patient identity with two identifiers, discussed telehealth limitations, and patient agreed to proceed. Patient Location:: home Provider Location:: home office Interpreter Needed?: No Pre-visit prep was completed: yes AWV questionnaire completed by patient prior to visit?: no Living arrangements:: with family/others Patient's Overall Health Status Rating: very good Typical amount of pain: none Does pain affect daily life?: no Are you currently prescribed opioids?: no  Dietary Habits and Nutritional Risks How many meals a day?: 3 Eats fruit and vegetables daily?: yes Most meals are obtained by: preparing own meals Diabetic:: no  Functional Status Activities of Daily Living (to include ambulation/medication): Independent Ambulation: Independent with device- listed below Home Assistive Devices/Equipment: Walker (specify Type) Medication Administration: Needs assistance (comment) Is this a change from baseline?: -- (family) Home Management (perform basic housework or laundry): 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 Jama in [place] in [place]) Concerns about hearing?: no  Fall Screening Falls in the past year?: 1 Number of falls in past year: 1 Was there an injury with Fall?: 0 Fall Risk Category Calculator: 2 Patient Fall Risk Level: Moderate Fall Risk  Fall Risk Patient at Risk for Falls Due to: Impaired balance/gait; Impaired mobility; No Fall Risks Fall risk Follow up: Falls evaluation completed; Education provided  Home and Transportation Safety: All rugs have non-skid backing?: yes All stairs or steps have railings?: yes Grab bars in the bathtub or shower?: (!) no Have non-skid surface in bathtub or shower?: yes Good home lighting?: yes Regular seat belt use?: yes Hospital stays in the last year:: (!) yes How many hospital stays:: 7 Reason: due to strokes  Cognitive Assessment Difficulty concentrating, remembering, or making decisions? : no Will 6CIT or Mini Cog be Completed: yes What year is it?: 0 points What month is it?: 0 points Give patient an address phrase to remember (5 components): [address]  in [place] About what time is it?: 0 points Count backwards from 20 to 1: 0 points Say the months of the year in reverse: 0 points Repeat the address phrase from earlier: 0 points 6 CIT Score: 0 points  Advance Directives (For Healthcare) Does Patient Have a Medical Advance Directive?: No Does patient want to make changes to medical advance directive?: No - Patient declined Type of Advance Directive: Healthcare Power of Attorney Copy of Healthcare Power of Attorney in Chart?: Yes - validated most recent copy scanned in chart (See row information) Would patient like information on creating a medical advance directive?: No - Patient declined  Reviewed/Updated  Reviewed/Updated: Reviewed All (Medical, Surgical, Family, Medications, Allergies, Care Teams, Patient Goals); Medical History; Surgical History; Family History; Medications;  Allergies; Care Teams; Patient Goals    Allergies (verified)  Lipitor [atorvastatin ], Allopurinol , Colchicine , Meloxicam , Statins, Crestor  [rosuvastatin ], and Penicillins   Current Medications (verified) Outpatient Encounter Medications as of 02/22/2024  Medication Sig   acetaminophen  (TYLENOL ) 325 MG tablet Take 2 tablets (650 mg total) by mouth every 6 (six) hours as needed for mild pain.   apixaban  (ELIQUIS ) 5 MG TABS tablet Take 1 tablet (5 mg total) by mouth 2 (two) times daily.   Ascorbic Acid (VITAMIN C PO) Take 1 tablet by mouth daily. 500 mg   Calcium  Carb-Cholecalciferol  600-800 MG-UNIT TABS Take 1 tablet by mouth daily.   Cholecalciferol  (VITAMIN D ) 2000 units CAPS Take 2,000 Units by mouth daily.   cyanocobalamin  1000 MCG tablet Take 1,000 mcg by mouth daily.   ezetimibe  (ZETIA ) 10 MG tablet Take 1 tablet (10 mg total) by mouth daily.   feeding supplement (ENSURE ENLIVE / ENSURE PLUS) LIQD Take 237 mLs by mouth 2 (two) times daily between meals.   furosemide  (LASIX ) 20 MG tablet Take 1 tablet (20 mg total) by mouth daily as needed for edema.   lisinopril  (ZESTRIL ) 40 MG tablet Take 1 tablet (40 mg total) by mouth daily.   loratadine  (CLARITIN ) 10 MG tablet Take 10 mg by mouth daily.   meclizine  (ANTIVERT ) 12.5 MG tablet Take 1 tablet (12.5 mg total) by mouth every 6 (six) hours as needed for dizziness.   metoprolol  tartrate (LOPRESSOR ) 25 MG tablet Take 1 tablet (25 mg total) by mouth 2 (two) times daily.   rosuvastatin  (CRESTOR ) 5 MG tablet Take 1 tablet (5 mg total) by mouth daily.   No facility-administered encounter medications on file as of 02/22/2024.    History: Past Medical History:  Diagnosis Date   Allergy    seasonal   Chicken pox    Complication of anesthesia    difficult to wake up after tubal ligations   Dyspnea    with exertion   Heart murmur    History of bronchitis    History of kidney stones    only one   Hypertension    Measles    Past  Surgical History:  Procedure Laterality Date   AORTIC VALVE REPLACEMENT N/A 01/17/2016   Procedure: AORTIC VALVE REPLACEMENT (AVR) USING EDWARDS MAGNA EASE PERICARDIAL BIOPROSTHESIS VALVE;  Surgeon: Dallas KATHEE Jude, MD;  Location: [facility] OR;  Service: Open Heart Surgery;  Laterality: N/A;   CARDIAC CATHETERIZATION N/A 09/24/2015   Procedure: Right/Left Heart Cath and Coronary Angiography;  Surgeon: Candyce GORMAN Reek, MD;  Location: [facility] INVASIVE CV LAB;  Service: Cardiovascular;  Laterality: N/A;   COLONOSCOPY     REPLACEMENT ASCENDING AORTA N/A 01/17/2016   Procedure: REPLACEMENT ASCENDING AORTA USING HEMASHIELD PLATINUM WOVEN DOUBLE VELOUR VASCULAR GRAFT;  Surgeon: Dallas KATHEE Jude, MD;  Location: [facility] OR;  Service: Open Heart Surgery;  Laterality: N/A;   TEE WITHOUT CARDIOVERSION N/A 01/17/2016   Procedure: TRANSESOPHAGEAL ECHOCARDIOGRAM (TEE);  Surgeon: Dallas KATHEE Jude, MD;  Location: [facility] OR;  Service: Open Heart Surgery;  Laterality: N/A;   TUBAL LIGATION     Family History  Problem Relation Age of Onset   Alzheimer's disease Mother    Heart disease Mother    Heart attack Father 90   Post-traumatic stress disorder Brother    Breast cancer Maternal Aunt    Heart attack Son    Other Son        5 back surgeries   Stroke Neg Hx    Social History   Occupational History   Occupation: retired  Employer: [employer]   Occupation: Retired    Comment: biochemist, clinical  Tobacco Use   Smoking status: Former    Current packs/day: 0.00    Average packs/day: 0.5 packs/day for 5.2 years (2.6 ttl pk-yrs)    Types: Cigarettes    Start date: 08/04/1968    Quit date: 10/10/1973    Years since quitting: 50.4    Passive exposure: Current   Smokeless tobacco: Never   Tobacco comments:    smoked less than 1 pack every 2 weeks. Havent smoked in atleast 40 years  Vaping Use   Vaping status: Never Used  Substance and Sexual Activity   Alcohol use: Not Currently    Comment: seldom    Drug use: No   Sexual activity: Not Currently   Tobacco Counseling Counseling given: Yes Tobacco comments: smoked less than 1 pack every 2 weeks. Havent smoked in atleast 40 years  SDOH Screenings   Food Insecurity: No Food Insecurity (02/22/2024)  Housing: Low Risk (02/22/2024)  Transportation Needs: No Transportation Needs (02/22/2024)  Utilities: Not At Risk (02/22/2024)  Alcohol Screen: Low Risk (11/08/2021)  Depression (PHQ2-9): Low Risk (02/22/2024)  Financial Resource Strain: Low Risk (11/08/2021)  Physical Activity: Inactive (02/22/2024)  Social Connections: Socially Isolated (02/22/2024)  Stress: No Stress Concern Present (02/22/2024)  Tobacco Use: Medium Risk (02/22/2024)  Health Literacy: Adequate Health Literacy (02/22/2024)   See flowsheets for full screening details  Depression Screen PHQ 2 & 9 Depression Scale- Over the past 2 weeks, how often have you been bothered by any of the following problems? Little interest or pleasure in doing things: 0 Feeling down, depressed, or hopeless (PHQ Adolescent also includes...irritable): 0 PHQ-2 Total Score: 0     Goals Addressed             This Visit's Progress    Prevent Falls and Injury               Objective:    There were no vitals filed for this visit. There is no height or weight on file to calculate BMI.  Hearing/Vision screen No results found. Immunizations and Health Maintenance Health Maintenance  Topic Date Due   DTaP/Tdap/Td (1 - Tdap) Never done   Zoster Vaccines- Shingrix (1 of 2) Never done   Bone Density Scan  04/03/2019   Mammogram  11/24/2022   COVID-19 Vaccine (4 - 2025-26 season) 11/05/2023   Medicare Annual Wellness (AWV)  02/21/2025   Pneumococcal Vaccine: 50+ Years  Completed   Influenza Vaccine  Completed   Hepatitis C Screening  Completed   Meningococcal B Vaccine  Aged Out   Colonoscopy  Discontinued        Assessment/Plan:  This is a routine wellness examination for  D[place].  Patient Care Team: Gladis Mustard, FNP as PCP - General (Family Medicine) Alvan, Dorn FALCON, MD as PCP - Cardiology (Cardiology) Bonner Ade, MD as Consulting Physician (Physical Medicine and Rehabilitation) Fidel Rogue, MD as Consulting Physician (Orthopedic Surgery)  I have personally reviewed and noted the following in the patients chart:   Medical and social history Use of alcohol, tobacco or illicit drugs  Current medications and supplements including opioid prescriptions. Functional ability and status Nutritional status Physical activity Advanced directives List of other physicians Hospitalizations, surgeries, and ER visits in previous 12 months Vitals Screenings to include cognitive, depression, and falls Referrals and appointments  Orders Placed This Encounter  Procedures   DG [facility] DEXA    Standing Status:   Future  Expiration Date:   02/21/2025    Reason for Exam (SYMPTOM  OR DIAGNOSIS REQUIRED):   Bone density   MM DIGITAL SCREENING BILATERAL    Standing Status:   Future    Expiration Date:   02/21/2025    Preferred imaging location?:   [facility]    Reason for exam::   Breast cancer screening    Release to patient:   Immediate   In addition, I have reviewed and discussed with patient certain preventive protocols, quality metrics, and best practice recommendations. A written personalized care plan for preventive services as well as general preventive health recommendations were provided to patient.   Ozie Ned, CMA   02/22/2024   Return in 1 year (on 02/21/2025).  After Visit Summary: (MyChart) Due to this being a telephonic visit, the after visit summary with patients personalized plan was offered to patient via MyChart   Nurse Notes: Pt is due the following: Shingles--pt declined/DTAP and covid--declined  "

## 2024-02-22 NOTE — Patient Instructions (Signed)
 Vitamin B12 Deficiency Vitamin B12 deficiency means that your body does not have enough vitamin B12. The body needs this important vitamin: To make red blood cells. To make genes (DNA). To help the nerves work. If you do not have enough vitamin B12 in your body, you can have health problems, such as not having enough red blood cells in the blood (anemia). What are the causes? Not eating enough foods that contain vitamin B12. Not being able to take in (absorb) vitamin B12 from the food that you eat. Certain diseases. A condition in which the body does not make enough of a certain protein. This results in your body not taking in enough vitamin B12. Having a surgery in which part of the stomach or small intestine is taken out. Taking medicines that make it hard for the body to take in vitamin B12. These include: Heartburn medicines. Some medicines that are used to treat diabetes. What increases the risk? Being an older adult. Eating a vegetarian or vegan diet that does not include any foods that come from animals. Not eating enough foods that contain vitamin B12 while you are pregnant. Taking certain medicines. Having alcoholism. What are the signs or symptoms? In some cases, there are no symptoms. If the condition leads to too few blood cells or nerve damage, symptoms can occur, such as: Feeling weak or tired. Not being hungry. Losing feeling (numbness) or tingling in your hands and feet. Redness and burning of the tongue. Feeling sad (depressed). Confusion or memory problems. Trouble walking. If anemia is very bad, symptoms can include: Being short of breath. Being dizzy. Having a very fast heartbeat. How is this treated? Changing the way you eat and drink, such as: Eating more foods that contain vitamin B12. Drinking little or no alcohol. Getting vitamin B12 shots. Taking vitamin B12 supplements by mouth (orally). Your doctor will tell you the dose that is best for you. Follow  these instructions at home: Eating and drinking  Eat foods that come from animals and have a lot of vitamin B12 in them. These include: Meats and poultry. This includes beef, pork, chicken, [place], and organ meats, such as liver. Seafood, such as clams, rainbow trout, salmon, tuna, and haddock. Eggs. Dairy foods such as milk, yogurt, and cheese. Eat breakfast cereals that have vitamin B12 added to them (are fortified). Check the label. The items listed above may not be a complete list of foods and beverages you can eat and drink. Contact a dietitian for more information. Alcohol use Do not drink alcohol if: Your doctor tells you not to drink. You are pregnant, may be pregnant, or are planning to become pregnant. If you drink alcohol: Limit how much you have to: 0-1 drink a day for women. 0-2 drinks a day for men. Know how much alcohol is in your drink. In the [place] one drink equals one 12 oz bottle of beer (355 mL), one 5 oz glass of wine (148 mL), or one 1 oz glass of hard liquor (44 mL). General instructions Get any vitamin B12 shots if told by your doctor. Take supplements only as told by your doctor. Follow the directions. Keep all follow-up visits. Contact a doctor if: Your symptoms come back. Your symptoms get worse or do not get better with treatment. Get help right away if: You have trouble breathing. You have a very fast heartbeat. You have chest pain. You get dizzy. You faint. These symptoms may be an emergency. Get help right away. Call 911.  Do not wait to see if the symptoms will go away. Do not drive yourself to the hospital. Summary Vitamin B12 deficiency means that your body is not getting enough of the vitamin. In some cases, there are no symptoms of this condition. Treatment may include making a change in the way you eat and drink, getting shots, or taking supplements. Eat foods that have vitamin B12 in them. This information is not intended to replace advice  given to you by your health care provider. Make sure you discuss any questions you have with your health care provider. Document Revised: 10/15/2020 Document Reviewed: 10/15/2020 Elsevier Patient Education  2024 [employer].

## 2024-02-22 NOTE — Patient Instructions (Signed)
 Angela Dawson,  Thank you for taking the time for your Medicare Wellness Visit. I appreciate your continued commitment to your health goals. Please review the care plan we discussed, and feel free to reach out if I can assist you further.  Please note that Annual Wellness Visits do not include a physical exam. Some assessments may be limited, especially if the visit was conducted virtually. If needed, we may recommend an in-person follow-up with your provider.  Ongoing Care Seeing your primary care provider every 3 to 6 months helps us  monitor your health and provide consistent, personalized care.   Referrals If a referral was made during today's visit and you haven't received any updates within two weeks, please contact the referred provider directly to check on the status.  Recommended Screenings:  Health Maintenance  Topic Date Due   DTaP/Tdap/Td vaccine (1 - Tdap) Never done   Zoster (Shingles) Vaccine (1 of 2) Never done   Osteoporosis screening with Bone Density Scan  04/03/2019   Breast Cancer Screening  11/24/2022   COVID-19 Vaccine (4 - 2025-26 season) 11/05/2023   Medicare Annual Wellness Visit  02/21/2025   Pneumococcal Vaccine for age over 42  Completed   Flu Shot  Completed   Hepatitis C Screening  Completed   Meningitis B Vaccine  Aged Out   Colon Cancer Screening  Discontinued       02/22/2024    1:23 PM  Advanced Directives  Does Patient Have a Medical Advance Directive? No  Would patient like information on creating a medical advance directive? No - Patient declined    Vision: Annual vision screenings are recommended for early detection of glaucoma, cataracts, and diabetic retinopathy. These exams can also reveal signs of chronic conditions such as diabetes and high blood pressure.  Dental: Annual dental screenings help detect early signs of oral cancer, gum disease, and other conditions linked to overall health, including heart disease and diabetes.  Please see  the attached documents for additional preventive care recommendations.

## 2024-02-22 NOTE — Progress Notes (Signed)
 "  Subjective:    Patient ID: Angela Dawson, female    DOB: August 22, 1945, 78 y.o.   MRN: [id number]    Chief Complaint: medical management of chronic issues     HPI:  Angela Dawson is a 78 y.o. who identifies as a female who was assigned female at birth.   Social history: Lives with: by herself- family and friends check on her daily Work history: retired from [employer]   Comes in today for follow up of the following chronic medical issues:  1. Primary hypertension No c/o chest pain, sob or headache. Does check blood pressure at home. Runs 120-130's systolic. BP Readings from Last 3 Encounters:  12/06/23 136/76  11/08/23 (!) 174/72  09/25/23 (!) 160/82     2. Mixed hyperlipidemia Does not really watch diet and does no dedicated exercise Lab Results  Component Value Date   CHOL 154 11/08/2023   HDL 49 11/08/2023   LDLCALC 90 11/08/2023   TRIG 79 11/08/2023   CHOLHDL 3.1 11/08/2023      3. Ascending aorta dilatation (HCC) Had sleeve surgery in 2018. Has not had follow up scan since 2018.  4. hypothyroidism Lab Results  Component Value Date   TSH 4.590 (H) 11/08/2023     5. B12 deficiency Is on oral supplement.  Lab Results  Component Value Date   VITAMINB12 108 (L) 06/23/2023     6. Severe obesity (BMI >= 40) (HCC) Weight is unchanged  Wt Readings from Last 3 Encounters:  02/22/24 226 lb (102.5 kg)  12/06/23 226 lb 12.8 oz (102.9 kg)  11/08/23 224 lb (101.6 kg)   BMI Readings from Last 3 Encounters:  02/22/24 47.23 kg/m  12/06/23 47.40 kg/m  11/08/23 46.82 kg/m       New complaints: Had a stroke several months ago and has had some incontinence since then. Would like to try some meds to see if will help.  Allergies  Allergen Reactions   Lipitor [Atorvastatin ] Other (See Comments)    Unable to stand or move   Allopurinol  Diarrhea and Nausea And Vomiting    Pt reported this and colchicine  both caused a lot of stomach issues    Colchicine  Diarrhea and Nausea And Vomiting    Pt reported this and allopurinol  both caused a lot of stomach issues   Meloxicam  Other (See Comments)    Made patient dizzy and caused her heart to race but can take a half tablet   Statins Other (See Comments)    Leg cramps    Crestor  [Rosuvastatin ] Other (See Comments)    Dizziness   Penicillins Rash    Immediate rash, facial/tongue/throat swelling, SOB or lightheadedness with hypotension   Outpatient Encounter Medications as of 02/22/2024  Medication Sig   acetaminophen  (TYLENOL ) 325 MG tablet Take 2 tablets (650 mg total) by mouth every 6 (six) hours as needed for mild pain.   apixaban  (ELIQUIS ) 5 MG TABS tablet Take 1 tablet (5 mg total) by mouth 2 (two) times daily.   Ascorbic Acid (VITAMIN C PO) Take 1 tablet by mouth daily. 500 mg   Calcium  Carb-Cholecalciferol  600-800 MG-UNIT TABS Take 1 tablet by mouth daily.   Cholecalciferol  (VITAMIN D ) 2000 units CAPS Take 2,000 Units by mouth daily.   cyanocobalamin  1000 MCG tablet Take 1,000 mcg by mouth daily.   ezetimibe  (ZETIA ) 10 MG tablet Take 1 tablet (10 mg total) by mouth daily.   feeding supplement (ENSURE ENLIVE / ENSURE PLUS) LIQD Take 237  mLs by mouth 2 (two) times daily between meals.   furosemide  (LASIX ) 20 MG tablet Take 1 tablet (20 mg total) by mouth daily as needed for edema.   lisinopril  (ZESTRIL ) 40 MG tablet Take 1 tablet (40 mg total) by mouth daily.   loratadine  (CLARITIN ) 10 MG tablet Take 10 mg by mouth daily.   meclizine  (ANTIVERT ) 12.5 MG tablet Take 1 tablet (12.5 mg total) by mouth every 6 (six) hours as needed for dizziness.   metoprolol  tartrate (LOPRESSOR ) 25 MG tablet Take 1 tablet (25 mg total) by mouth 2 (two) times daily.   rosuvastatin  (CRESTOR ) 5 MG tablet Take 1 tablet (5 mg total) by mouth daily.   No facility-administered encounter medications on file as of 02/22/2024.    Past Surgical History:  Procedure Laterality Date   AORTIC VALVE REPLACEMENT  N/A 01/17/2016   Procedure: AORTIC VALVE REPLACEMENT (AVR) USING EDWARDS MAGNA EASE PERICARDIAL BIOPROSTHESIS VALVE;  Surgeon: Dallas KATHEE Jude, MD;  Location: [facility] OR;  Service: Open Heart Surgery;  Laterality: N/A;   CARDIAC CATHETERIZATION N/A 09/24/2015   Procedure: Right/Left Heart Cath and Coronary Angiography;  Surgeon: Candyce GORMAN Reek, MD;  Location: [facility] INVASIVE CV LAB;  Service: Cardiovascular;  Laterality: N/A;   COLONOSCOPY     REPLACEMENT ASCENDING AORTA N/A 01/17/2016   Procedure: REPLACEMENT ASCENDING AORTA USING HEMASHIELD PLATINUM WOVEN DOUBLE VELOUR VASCULAR GRAFT;  Surgeon: Dallas KATHEE Jude, MD;  Location: [facility] OR;  Service: Open Heart Surgery;  Laterality: N/A;   TEE WITHOUT CARDIOVERSION N/A 01/17/2016   Procedure: TRANSESOPHAGEAL ECHOCARDIOGRAM (TEE);  Surgeon: Dallas KATHEE Jude, MD;  Location: [facility] OR;  Service: Open Heart Surgery;  Laterality: N/A;   TUBAL LIGATION      Family History  Problem Relation Age of Onset   Alzheimer's disease Mother    Heart disease Mother    Heart attack Father 96   Post-traumatic stress disorder Brother    Breast cancer Maternal Aunt    Heart attack Son    Other Son        5 back surgeries   Stroke Neg Hx       Controlled substance contract: n/a      Review of Systems  Constitutional:  Negative for diaphoresis.  Eyes:  Negative for pain.  Respiratory:  Negative for shortness of breath.   Cardiovascular:  Negative for chest pain, palpitations and leg swelling.  Gastrointestinal:  Negative for abdominal pain.  Endocrine: Negative for polydipsia.  Skin:  Negative for rash.  Neurological:  Negative for dizziness, weakness and headaches.  Hematological:  Does not bruise/bleed easily.  All other systems reviewed and are negative.      Objective:   Physical Exam Vitals and nursing note reviewed.  Constitutional:      General: She is not in acute distress.    Appearance: Normal appearance. She is well-developed.   HENT:     Head: Normocephalic.     Right Ear: Tympanic membrane normal.     Left Ear: Tympanic membrane normal.     Nose: Nose normal.     Mouth/Throat:     Mouth: Mucous membranes are moist.  Eyes:     Pupils: Pupils are equal, round, and reactive to light.  Neck:     Vascular: No carotid bruit or JVD.  Cardiovascular:     Rate and Rhythm: Normal rate and regular rhythm.     Heart sounds: Normal heart sounds.  Pulmonary:     Effort: Pulmonary effort is  normal. No respiratory distress.     Breath sounds: Normal breath sounds. No wheezing or rales.  Chest:     Chest wall: No tenderness.  Abdominal:     General: Bowel sounds are normal. There is no distension or abdominal bruit.     Palpations: Abdomen is soft. There is no hepatomegaly, splenomegaly, mass or pulsatile mass.     Tenderness: There is no abdominal tenderness.  Musculoskeletal:        General: Normal range of motion.     Cervical back: Normal range of motion and neck supple.  Lymphadenopathy:     Cervical: No cervical adenopathy.  Skin:    General: Skin is warm and dry.     Comments: Bluish discoloration to left posterior thigh area. Nontender, no drainage  Neurological:     Mental Status: She is alert and oriented to person, place, and time.     Deep Tendon Reflexes: Reflexes are normal and symmetric.  Psychiatric:        Behavior: Behavior normal.        Thought Content: Thought content normal.        Judgment: Judgment normal.     BP 136/78   Pulse 76   Temp (!) 97.2 F (36.2 C) (Temporal)   Ht 4' 10 (1.473 m)   Wt 241 lb (109.3 kg)   BMI 50.37 kg/m       Assessment & Plan:   CYNDRA FEINBERG comes in today with chief complaint of Annual Exam   Diagnosis and orders addressed:  1. Essential hypertension (Primary) Dash diet  - CBC with Differential/Platelet - CMP14+EGFR  2. Mixed hyperlipidemia Low fat diet - Lipid panel  3. Ascending aorta dilatation Need to follow up with  surgeon  4. Acquired hypothyroidism Labs pending  5. B12 deficiency She has not been taking - Vitamin B12  6. Severe obesity (BMI >= 40) (HCC) Discussed diet and exercise for person with BMI >25 Will recheck weight in 3-6 months   7. Stress incontinence of urine - mirabegron  ER (MYRBETRIQ ) 50 MG TB24 tablet; Take 1 tablet (50 mg total) by mouth daily.  Dispense: 90 tablet; Refill: 1   Labs pending Health Maintenance reviewed Diet and exercise encouraged  Follow up plan: 6 months   Mary-Margaret Gladis, FNP  "

## 2024-02-23 LAB — CMP14+EGFR
ALT: 13 IU/L (ref 0–32)
AST: 16 IU/L (ref 0–40)
Albumin: 4.1 g/dL (ref 3.8–4.8)
Alkaline Phosphatase: 67 IU/L (ref 49–135)
BUN/Creatinine Ratio: 17 (ref 12–28)
BUN: 13 mg/dL (ref 8–27)
Bilirubin Total: 0.3 mg/dL (ref 0.0–1.2)
CO2: 27 mmol/L (ref 20–29)
Calcium: 9.1 mg/dL (ref 8.7–10.3)
Chloride: 102 mmol/L (ref 96–106)
Creatinine, Ser: 0.78 mg/dL (ref 0.57–1.00)
Globulin, Total: 2.6 g/dL (ref 1.5–4.5)
Glucose: 88 mg/dL (ref 70–99)
Potassium: 4.2 mmol/L (ref 3.5–5.2)
Sodium: 141 mmol/L (ref 134–144)
Total Protein: 6.7 g/dL (ref 6.0–8.5)
eGFR: 78 mL/min/1.73

## 2024-02-23 LAB — CBC WITH DIFFERENTIAL/PLATELET
Basophils Absolute: 0.1 x10E3/uL (ref 0.0–0.2)
Basos: 1 %
EOS (ABSOLUTE): 0.3 x10E3/uL (ref 0.0–0.4)
Eos: 3 %
Hematocrit: 40.9 % (ref 34.0–46.6)
Hemoglobin: 13.4 g/dL (ref 11.1–15.9)
Immature Grans (Abs): 0 x10E3/uL (ref 0.0–0.1)
Immature Granulocytes: 0 %
Lymphocytes Absolute: 3.5 x10E3/uL — ABNORMAL HIGH (ref 0.7–3.1)
Lymphs: 33 %
MCH: 32.7 pg (ref 26.6–33.0)
MCHC: 32.8 g/dL (ref 31.5–35.7)
MCV: 100 fL — ABNORMAL HIGH (ref 79–97)
Monocytes Absolute: 0.8 x10E3/uL (ref 0.1–0.9)
Monocytes: 8 %
Neutrophils Absolute: 5.7 x10E3/uL (ref 1.4–7.0)
Neutrophils: 55 %
Platelets: 171 x10E3/uL (ref 150–450)
RBC: 4.1 x10E6/uL (ref 3.77–5.28)
RDW: 12.5 % (ref 11.7–15.4)
WBC: 10.5 x10E3/uL (ref 3.4–10.8)

## 2024-02-23 LAB — LIPID PANEL
Cholesterol, Total: 142 mg/dL (ref 100–199)
HDL: 51 mg/dL
LDL CALC COMMENT:: 2.8 ratio (ref 0.0–4.4)
LDL Chol Calc (NIH): 71 mg/dL (ref 0–99)
Triglycerides: 113 mg/dL (ref 0–149)
VLDL Cholesterol Cal: 20 mg/dL (ref 5–40)

## 2024-02-23 LAB — VITAMIN B12: Vitamin B-12: 312 pg/mL (ref 232–1245)

## 2024-02-25 ENCOUNTER — Ambulatory Visit: Payer: Self-pay | Admitting: Nurse Practitioner

## 2024-03-08 ENCOUNTER — Ambulatory Visit: Payer: Self-pay | Admitting: Cardiology

## 2024-03-12 ENCOUNTER — Encounter: Payer: Self-pay | Admitting: *Deleted

## 2024-03-12 ENCOUNTER — Other Ambulatory Visit: Payer: Self-pay | Admitting: Nurse Practitioner

## 2024-05-02 ENCOUNTER — Other Ambulatory Visit

## 2024-05-02 ENCOUNTER — Ambulatory Visit: Payer: Self-pay | Admitting: Nurse Practitioner

## 2024-05-06 ENCOUNTER — Ambulatory Visit: Admitting: Cardiology

## 2024-08-22 ENCOUNTER — Ambulatory Visit: Admitting: Nurse Practitioner
